# Patient Record
Sex: Male | Born: 1950 | Race: Black or African American | Hispanic: No | Marital: Single | Smoking: Former smoker
Health system: Southern US, Community
[De-identification: ages and names within clinical notes are randomized; demographics above are authoritative.]

## PROBLEM LIST (undated history)

## (undated) DIAGNOSIS — C801 Malignant (primary) neoplasm, unspecified: Secondary | ICD-10-CM

## (undated) DIAGNOSIS — D649 Anemia, unspecified: Secondary | ICD-10-CM

## (undated) DIAGNOSIS — N183 Chronic kidney disease, stage 3 unspecified: Secondary | ICD-10-CM

## (undated) DIAGNOSIS — D509 Iron deficiency anemia, unspecified: Secondary | ICD-10-CM

## (undated) DIAGNOSIS — E785 Hyperlipidemia, unspecified: Secondary | ICD-10-CM

## (undated) DIAGNOSIS — C8190 Hodgkin lymphoma, unspecified, unspecified site: Secondary | ICD-10-CM

## (undated) DIAGNOSIS — I10 Essential (primary) hypertension: Secondary | ICD-10-CM

## (undated) HISTORY — PX: NO PAST SURGERIES: SHX2092

## (undated) HISTORY — DX: Hyperlipidemia, unspecified: E78.5

## (undated) HISTORY — DX: Essential (primary) hypertension: I10

---

## 2000-02-23 ENCOUNTER — Ambulatory Visit (HOSPITAL_COMMUNITY): Admission: RE | Admit: 2000-02-23 | Discharge: 2000-02-23 | Payer: Self-pay | Admitting: Pulmonary Disease

## 2000-02-23 ENCOUNTER — Encounter: Payer: Self-pay | Admitting: Pulmonary Disease

## 2000-05-30 ENCOUNTER — Ambulatory Visit (HOSPITAL_COMMUNITY): Admission: RE | Admit: 2000-05-30 | Discharge: 2000-05-30 | Payer: Self-pay | Admitting: Pulmonary Disease

## 2000-05-30 ENCOUNTER — Encounter: Payer: Self-pay | Admitting: Pulmonary Disease

## 2000-08-07 ENCOUNTER — Emergency Department (HOSPITAL_COMMUNITY): Admission: EM | Admit: 2000-08-07 | Discharge: 2000-08-07 | Payer: Self-pay | Admitting: Emergency Medicine

## 2002-02-07 ENCOUNTER — Encounter: Payer: Self-pay | Admitting: Emergency Medicine

## 2002-02-07 ENCOUNTER — Emergency Department (HOSPITAL_COMMUNITY): Admission: EM | Admit: 2002-02-07 | Discharge: 2002-02-07 | Payer: Self-pay | Admitting: Emergency Medicine

## 2002-09-24 ENCOUNTER — Emergency Department (HOSPITAL_COMMUNITY): Admission: EM | Admit: 2002-09-24 | Discharge: 2002-09-24 | Payer: Self-pay | Admitting: Emergency Medicine

## 2002-12-04 ENCOUNTER — Emergency Department (HOSPITAL_COMMUNITY): Admission: EM | Admit: 2002-12-04 | Discharge: 2002-12-04 | Payer: Self-pay | Admitting: Emergency Medicine

## 2002-12-04 ENCOUNTER — Encounter: Payer: Self-pay | Admitting: Emergency Medicine

## 2003-08-16 DIAGNOSIS — E785 Hyperlipidemia, unspecified: Secondary | ICD-10-CM

## 2003-08-16 HISTORY — DX: Hyperlipidemia, unspecified: E78.5

## 2007-10-27 ENCOUNTER — Emergency Department (HOSPITAL_COMMUNITY): Admission: EM | Admit: 2007-10-27 | Discharge: 2007-10-27 | Payer: Self-pay | Admitting: Emergency Medicine

## 2007-10-29 ENCOUNTER — Ambulatory Visit (HOSPITAL_COMMUNITY): Admission: RE | Admit: 2007-10-29 | Discharge: 2007-10-29 | Payer: Self-pay | Admitting: Pulmonary Disease

## 2007-11-05 ENCOUNTER — Ambulatory Visit: Payer: Self-pay | Admitting: Oncology

## 2007-11-09 ENCOUNTER — Ambulatory Visit (HOSPITAL_COMMUNITY): Admission: RE | Admit: 2007-11-09 | Discharge: 2007-11-09 | Payer: Self-pay | Admitting: Oncology

## 2007-11-13 ENCOUNTER — Ambulatory Visit (HOSPITAL_COMMUNITY): Admission: RE | Admit: 2007-11-13 | Discharge: 2007-11-13 | Payer: Self-pay | Admitting: Oncology

## 2007-11-13 ENCOUNTER — Encounter (INDEPENDENT_AMBULATORY_CARE_PROVIDER_SITE_OTHER): Payer: Self-pay | Admitting: Interventional Radiology

## 2007-11-21 ENCOUNTER — Ambulatory Visit: Payer: Self-pay | Admitting: Oncology

## 2007-11-21 ENCOUNTER — Encounter (HOSPITAL_COMMUNITY): Payer: Self-pay | Admitting: Oncology

## 2007-11-21 ENCOUNTER — Ambulatory Visit (HOSPITAL_COMMUNITY): Admission: RE | Admit: 2007-11-21 | Discharge: 2007-11-21 | Payer: Self-pay | Admitting: Oncology

## 2007-11-26 ENCOUNTER — Ambulatory Visit: Admission: RE | Admit: 2007-11-26 | Discharge: 2007-11-26 | Payer: Self-pay | Admitting: Oncology

## 2007-11-28 ENCOUNTER — Encounter (HOSPITAL_COMMUNITY): Payer: Self-pay | Admitting: Oncology

## 2007-11-28 ENCOUNTER — Ambulatory Visit: Admission: RE | Admit: 2007-11-28 | Discharge: 2007-11-28 | Payer: Self-pay | Admitting: Oncology

## 2007-11-28 ENCOUNTER — Ambulatory Visit: Payer: Self-pay | Admitting: Internal Medicine

## 2007-11-28 ENCOUNTER — Encounter (HOSPITAL_COMMUNITY): Admission: RE | Admit: 2007-11-28 | Discharge: 2008-02-14 | Payer: Self-pay | Admitting: Oncology

## 2007-11-28 LAB — CBC WITH DIFFERENTIAL/PLATELET
Basophils Absolute: 0.2 10*3/uL — ABNORMAL HIGH (ref 0.0–0.1)
EOS%: 0.1 % (ref 0.0–7.0)
HCT: 22.5 % — ABNORMAL LOW (ref 38.7–49.9)
HGB: 7.3 g/dL — ABNORMAL LOW (ref 13.0–17.1)
MCH: 28.2 pg (ref 28.0–33.4)
MCV: 86.4 fL (ref 81.6–98.0)
MONO%: 9.4 % (ref 0.0–13.0)
NEUT%: 66.6 % (ref 40.0–75.0)
Platelets: 348 10*3/uL (ref 145–400)

## 2007-11-28 LAB — COMPREHENSIVE METABOLIC PANEL
ALT: 13 U/L (ref 0–53)
Alkaline Phosphatase: 378 U/L — ABNORMAL HIGH (ref 39–117)
CO2: 18 mEq/L — ABNORMAL LOW (ref 19–32)
Sodium: 135 mEq/L (ref 135–145)
Total Bilirubin: 1 mg/dL (ref 0.3–1.2)
Total Protein: 6.7 g/dL (ref 6.0–8.3)

## 2007-11-29 LAB — CBC WITH DIFFERENTIAL/PLATELET
BASO%: 0.5 % (ref 0.0–2.0)
EOS%: 0 % (ref 0.0–7.0)
HCT: 29 % — ABNORMAL LOW (ref 38.7–49.9)
HGB: 9.9 g/dL — ABNORMAL LOW (ref 13.0–17.1)
MCH: 28.9 pg (ref 28.0–33.4)
MCHC: 34 g/dL (ref 32.0–35.9)
MONO#: 1.1 10*3/uL — ABNORMAL HIGH (ref 0.1–0.9)
NEUT%: 78 % — ABNORMAL HIGH (ref 40.0–75.0)
RDW: 15.8 % — ABNORMAL HIGH (ref 11.2–14.6)
WBC: 19.4 10*3/uL — ABNORMAL HIGH (ref 4.0–10.0)
lymph#: 3.1 10*3/uL (ref 0.9–3.3)

## 2007-11-29 LAB — TECHNOLOGIST REVIEW

## 2007-12-06 LAB — CBC WITH DIFFERENTIAL/PLATELET
Basophils Absolute: 0 10*3/uL (ref 0.0–0.1)
Eosinophils Absolute: 0.1 10*3/uL (ref 0.0–0.5)
HGB: 10.6 g/dL — ABNORMAL LOW (ref 13.0–17.1)
MONO#: 0.2 10*3/uL (ref 0.1–0.9)
MONO%: 2.2 % (ref 0.0–13.0)
NEUT#: 5.8 10*3/uL (ref 1.5–6.5)
RBC: 3.68 10*6/uL — ABNORMAL LOW (ref 4.20–5.71)
RDW: 17.3 % — ABNORMAL HIGH (ref 11.2–14.6)
WBC: 8.5 10*3/uL (ref 4.0–10.0)
lymph#: 2.4 10*3/uL (ref 0.9–3.3)

## 2007-12-13 LAB — COMPREHENSIVE METABOLIC PANEL
ALT: 11 U/L (ref 0–53)
Alkaline Phosphatase: 222 U/L — ABNORMAL HIGH (ref 39–117)
Creatinine, Ser: 0.81 mg/dL (ref 0.40–1.50)
Sodium: 139 mEq/L (ref 135–145)
Total Bilirubin: 0.7 mg/dL (ref 0.3–1.2)
Total Protein: 7.2 g/dL (ref 6.0–8.3)

## 2007-12-13 LAB — CBC WITH DIFFERENTIAL/PLATELET
Basophils Absolute: 0.1 10*3/uL (ref 0.0–0.1)
Eosinophils Absolute: 0.1 10*3/uL (ref 0.0–0.5)
HCT: 29.8 % — ABNORMAL LOW (ref 38.7–49.9)
HGB: 9.9 g/dL — ABNORMAL LOW (ref 13.0–17.1)
LYMPH%: 30 % (ref 14.0–48.0)
MCV: 86.7 fL (ref 81.6–98.0)
MONO#: 1.5 10*3/uL — ABNORMAL HIGH (ref 0.1–0.9)
NEUT#: 3.9 10*3/uL (ref 1.5–6.5)
Platelets: 386 10*3/uL (ref 145–400)
RBC: 3.44 10*6/uL — ABNORMAL LOW (ref 4.20–5.71)
WBC: 7.8 10*3/uL (ref 4.0–10.0)

## 2007-12-13 LAB — ERYTHROCYTE SEDIMENTATION RATE: Sed Rate: 103 mm/hr — ABNORMAL HIGH (ref 0–20)

## 2007-12-13 LAB — URIC ACID: Uric Acid, Serum: 3.3 mg/dL — ABNORMAL LOW (ref 4.0–7.8)

## 2007-12-17 ENCOUNTER — Ambulatory Visit: Payer: Self-pay | Admitting: Oncology

## 2007-12-20 LAB — CBC WITH DIFFERENTIAL/PLATELET
BASO%: 0.8 % (ref 0.0–2.0)
LYMPH%: 30.5 % (ref 14.0–48.0)
MCHC: 33.8 g/dL (ref 32.0–35.9)
MONO#: 0.1 10*3/uL (ref 0.1–0.9)
MONO%: 0.8 % (ref 0.0–13.0)
Platelets: 456 10*3/uL — ABNORMAL HIGH (ref 145–400)
RBC: 3.44 10*6/uL — ABNORMAL LOW (ref 4.20–5.71)
WBC: 7.7 10*3/uL (ref 4.0–10.0)

## 2008-01-08 ENCOUNTER — Ambulatory Visit (HOSPITAL_COMMUNITY): Admission: RE | Admit: 2008-01-08 | Discharge: 2008-01-08 | Payer: Self-pay | Admitting: Oncology

## 2008-01-17 LAB — CBC WITH DIFFERENTIAL/PLATELET
BASO%: 0.7 % (ref 0.0–2.0)
LYMPH%: 25 % (ref 14.0–48.0)
MCHC: 31.6 g/dL — ABNORMAL LOW (ref 32.0–35.9)
MONO#: 1.8 10*3/uL — ABNORMAL HIGH (ref 0.1–0.9)
MONO%: 8.1 % (ref 0.0–13.0)
Platelets: 709 10*3/uL — ABNORMAL HIGH (ref 145–400)
RBC: 3.34 10*6/uL — ABNORMAL LOW (ref 4.20–5.71)
WBC: 21.7 10*3/uL — ABNORMAL HIGH (ref 4.0–10.0)

## 2008-01-23 ENCOUNTER — Ambulatory Visit (HOSPITAL_COMMUNITY): Admission: RE | Admit: 2008-01-23 | Discharge: 2008-01-23 | Payer: Self-pay | Admitting: Oncology

## 2008-01-23 LAB — CBC WITH DIFFERENTIAL/PLATELET
BASO%: 0.6 % (ref 0.0–2.0)
HCT: 29.8 % — ABNORMAL LOW (ref 38.7–49.9)
MCHC: 32.3 g/dL (ref 32.0–35.9)
MONO#: 0.9 10*3/uL (ref 0.1–0.9)
RBC: 3.51 10*6/uL — ABNORMAL LOW (ref 4.20–5.71)
RDW: 21.2 % — ABNORMAL HIGH (ref 11.2–14.6)
WBC: 14.9 10*3/uL — ABNORMAL HIGH (ref 4.0–10.0)
lymph#: 4.8 10*3/uL — ABNORMAL HIGH (ref 0.9–3.3)

## 2008-01-23 LAB — COMPREHENSIVE METABOLIC PANEL
ALT: 15 U/L (ref 0–53)
AST: 18 U/L (ref 0–37)
Albumin: 3.5 g/dL (ref 3.5–5.2)
Calcium: 9.1 mg/dL (ref 8.4–10.5)
Chloride: 108 mEq/L (ref 96–112)
Potassium: 3.8 mEq/L (ref 3.5–5.3)
Sodium: 141 mEq/L (ref 135–145)

## 2008-01-23 LAB — ERYTHROCYTE SEDIMENTATION RATE: Sed Rate: 105 mm/hr — ABNORMAL HIGH (ref 0–20)

## 2008-01-31 LAB — CBC WITH DIFFERENTIAL/PLATELET
BASO%: 0.2 % (ref 0.0–2.0)
Basophils Absolute: 0 10*3/uL (ref 0.0–0.1)
EOS%: 4.5 % (ref 0.0–7.0)
MCH: 28 pg (ref 28.0–33.4)
MCHC: 32.9 g/dL (ref 32.0–35.9)
MCV: 84.9 fL (ref 81.6–98.0)
MONO%: 2.3 % (ref 0.0–13.0)
NEUT%: 67.7 % (ref 40.0–75.0)
RDW: 21.5 % — ABNORMAL HIGH (ref 11.2–14.6)
lymph#: 3.1 10*3/uL (ref 0.9–3.3)

## 2008-02-05 ENCOUNTER — Ambulatory Visit: Payer: Self-pay | Admitting: Oncology

## 2008-02-07 LAB — COMPREHENSIVE METABOLIC PANEL
ALT: 12 U/L (ref 0–53)
AST: 16 U/L (ref 0–37)
Alkaline Phosphatase: 151 U/L — ABNORMAL HIGH (ref 39–117)
BUN: 14 mg/dL (ref 6–23)
Calcium: 9.1 mg/dL (ref 8.4–10.5)
Chloride: 105 mEq/L (ref 96–112)
Creatinine, Ser: 0.69 mg/dL (ref 0.40–1.50)
Potassium: 4.4 mEq/L (ref 3.5–5.3)

## 2008-02-07 LAB — CBC WITH DIFFERENTIAL/PLATELET
Basophils Absolute: 0.2 10*3/uL — ABNORMAL HIGH (ref 0.0–0.1)
EOS%: 5 % (ref 0.0–7.0)
Eosinophils Absolute: 0.5 10*3/uL (ref 0.0–0.5)
HCT: 34.1 % — ABNORMAL LOW (ref 38.7–49.9)
HGB: 11 g/dL — ABNORMAL LOW (ref 13.0–17.1)
LYMPH%: 25.8 % (ref 14.0–48.0)
MCH: 27.8 pg — ABNORMAL LOW (ref 28.0–33.4)
MCV: 86.5 fL (ref 81.6–98.0)
MONO%: 12.7 % (ref 0.0–13.0)
NEUT#: 5.9 10*3/uL (ref 1.5–6.5)
NEUT%: 54.8 % (ref 40.0–75.0)
Platelets: 445 10*3/uL — ABNORMAL HIGH (ref 145–400)
RDW: 21.3 % — ABNORMAL HIGH (ref 11.2–14.6)

## 2008-02-14 LAB — CBC WITH DIFFERENTIAL/PLATELET
BASO%: 0.5 % (ref 0.0–2.0)
Basophils Absolute: 0.1 10*3/uL (ref 0.0–0.1)
EOS%: 2.3 % (ref 0.0–7.0)
Eosinophils Absolute: 0.3 10*3/uL (ref 0.0–0.5)
HCT: 34.3 % — ABNORMAL LOW (ref 38.7–49.9)
HGB: 11 g/dL — ABNORMAL LOW (ref 13.0–17.1)
LYMPH%: 37.3 % (ref 14.0–48.0)
MCH: 27.6 pg — ABNORMAL LOW (ref 28.0–33.4)
MCHC: 32.1 g/dL (ref 32.0–35.9)
MCV: 86.1 fL (ref 81.6–98.0)
MONO#: 0.1 10*3/uL (ref 0.1–0.9)
MONO%: 1.1 % (ref 0.0–13.0)
NEUT#: 6.9 10*3/uL — ABNORMAL HIGH (ref 1.5–6.5)
NEUT%: 58.8 % (ref 40.0–75.0)
Platelets: 529 10*3/uL — ABNORMAL HIGH (ref 145–400)
RBC: 3.99 10*6/uL — ABNORMAL LOW (ref 4.20–5.71)
RDW: 21.9 % — ABNORMAL HIGH (ref 11.2–14.6)
WBC: 11.7 10*3/uL — ABNORMAL HIGH (ref 4.0–10.0)
lymph#: 4.4 10*3/uL — ABNORMAL HIGH (ref 0.9–3.3)

## 2008-02-20 LAB — CBC WITH DIFFERENTIAL/PLATELET
Eosinophils Absolute: 0.1 10*3/uL (ref 0.0–0.5)
HCT: 35.1 % — ABNORMAL LOW (ref 38.7–49.9)
HGB: 11.3 g/dL — ABNORMAL LOW (ref 13.0–17.1)
LYMPH%: 41 % (ref 14.0–48.0)
MONO#: 0.6 10*3/uL (ref 0.1–0.9)
NEUT#: 2.6 10*3/uL (ref 1.5–6.5)
NEUT%: 44.8 % (ref 40.0–75.0)
Platelets: 329 10*3/uL (ref 145–400)
WBC: 5.8 10*3/uL (ref 4.0–10.0)

## 2008-02-20 LAB — COMPREHENSIVE METABOLIC PANEL
AST: 12 U/L (ref 0–37)
Albumin: 3.9 g/dL (ref 3.5–5.2)
BUN: 16 mg/dL (ref 6–23)
Calcium: 8.9 mg/dL (ref 8.4–10.5)
Chloride: 111 mEq/L (ref 96–112)
Glucose, Bld: 78 mg/dL (ref 70–99)
Potassium: 4.8 mEq/L (ref 3.5–5.3)
Sodium: 142 mEq/L (ref 135–145)
Total Protein: 6.5 g/dL (ref 6.0–8.3)

## 2008-02-20 LAB — ERYTHROCYTE SEDIMENTATION RATE: Sed Rate: 40 mm/hr — ABNORMAL HIGH (ref 0–20)

## 2008-02-28 LAB — CBC WITH DIFFERENTIAL/PLATELET
BASO%: 0.2 % (ref 0.0–2.0)
Basophils Absolute: 0 10*3/uL (ref 0.0–0.1)
Eosinophils Absolute: 0.1 10*3/uL (ref 0.0–0.5)
HCT: 35.9 % — ABNORMAL LOW (ref 38.7–49.9)
HGB: 11.9 g/dL — ABNORMAL LOW (ref 13.0–17.1)
LYMPH%: 39.3 % (ref 14.0–48.0)
MCHC: 33 g/dL (ref 32.0–35.9)
MONO#: 0.2 10*3/uL (ref 0.1–0.9)
NEUT#: 5.5 10*3/uL (ref 1.5–6.5)
NEUT%: 57 % (ref 40.0–75.0)
Platelets: 428 10*3/uL — ABNORMAL HIGH (ref 145–400)
WBC: 9.7 10*3/uL (ref 4.0–10.0)
lymph#: 3.8 10*3/uL — ABNORMAL HIGH (ref 0.9–3.3)

## 2008-03-05 LAB — CBC WITH DIFFERENTIAL/PLATELET
Basophils Absolute: 0 10*3/uL (ref 0.0–0.1)
EOS%: 3.3 % (ref 0.0–7.0)
HCT: 38 % — ABNORMAL LOW (ref 38.7–49.9)
HGB: 12.3 g/dL — ABNORMAL LOW (ref 13.0–17.1)
LYMPH%: 41.9 % (ref 14.0–48.0)
MCH: 28.6 pg (ref 28.0–33.4)
MCHC: 32.3 g/dL (ref 32.0–35.9)
NEUT%: 39.4 % — ABNORMAL LOW (ref 40.0–75.0)
Platelets: 304 10*3/uL (ref 145–400)
lymph#: 2.1 10*3/uL (ref 0.9–3.3)

## 2008-03-05 LAB — COMPREHENSIVE METABOLIC PANEL
Albumin: 4.3 g/dL (ref 3.5–5.2)
Alkaline Phosphatase: 155 U/L — ABNORMAL HIGH (ref 39–117)
BUN: 14 mg/dL (ref 6–23)
Calcium: 9.4 mg/dL (ref 8.4–10.5)
Chloride: 108 mEq/L (ref 96–112)
Creatinine, Ser: 0.76 mg/dL (ref 0.40–1.50)
Glucose, Bld: 132 mg/dL — ABNORMAL HIGH (ref 70–99)
Potassium: 3.9 mEq/L (ref 3.5–5.3)

## 2008-03-05 LAB — URIC ACID: Uric Acid, Serum: 5.8 mg/dL (ref 4.0–7.8)

## 2008-03-20 LAB — CBC WITH DIFFERENTIAL/PLATELET
BASO%: 1.5 % (ref 0.0–2.0)
Eosinophils Absolute: 0.1 10*3/uL (ref 0.0–0.5)
HCT: 37.9 % — ABNORMAL LOW (ref 38.7–49.9)
MCHC: 31.9 g/dL — ABNORMAL LOW (ref 32.0–35.9)
MONO#: 1 10*3/uL — ABNORMAL HIGH (ref 0.1–0.9)
NEUT#: 2.1 10*3/uL (ref 1.5–6.5)
RBC: 4.22 10*6/uL (ref 4.20–5.71)
WBC: 5.7 10*3/uL (ref 4.0–10.0)
lymph#: 2.5 10*3/uL (ref 0.9–3.3)

## 2008-03-20 LAB — COMPREHENSIVE METABOLIC PANEL
ALT: 11 U/L (ref 0–53)
AST: 9 U/L (ref 0–37)
Creatinine, Ser: 0.71 mg/dL (ref 0.40–1.50)
Total Bilirubin: 0.4 mg/dL (ref 0.3–1.2)

## 2008-03-20 LAB — LACTATE DEHYDROGENASE: LDH: 154 U/L (ref 94–250)

## 2008-03-30 ENCOUNTER — Ambulatory Visit: Payer: Self-pay | Admitting: Oncology

## 2008-04-01 ENCOUNTER — Ambulatory Visit (HOSPITAL_COMMUNITY): Admission: RE | Admit: 2008-04-01 | Discharge: 2008-04-01 | Payer: Self-pay | Admitting: Oncology

## 2008-04-03 LAB — CBC WITH DIFFERENTIAL/PLATELET
Basophils Absolute: 0.1 10*3/uL (ref 0.0–0.1)
Eosinophils Absolute: 0.2 10*3/uL (ref 0.0–0.5)
HCT: 36.1 % — ABNORMAL LOW (ref 38.7–49.9)
HGB: 11.5 g/dL — ABNORMAL LOW (ref 13.0–17.1)
MONO#: 1 10*3/uL — ABNORMAL HIGH (ref 0.1–0.9)
NEUT%: 36.7 % — ABNORMAL LOW (ref 40.0–75.0)
Platelets: 273 10*3/uL (ref 145–400)
WBC: 6.8 10*3/uL (ref 4.0–10.0)
lymph#: 3 10*3/uL (ref 0.9–3.3)

## 2008-04-03 LAB — COMPREHENSIVE METABOLIC PANEL
ALT: 9 U/L (ref 0–53)
BUN: 14 mg/dL (ref 6–23)
CO2: 21 mEq/L (ref 19–32)
Calcium: 9.4 mg/dL (ref 8.4–10.5)
Chloride: 108 mEq/L (ref 96–112)
Creatinine, Ser: 0.74 mg/dL (ref 0.40–1.50)
Glucose, Bld: 131 mg/dL — ABNORMAL HIGH (ref 70–99)

## 2008-04-03 LAB — LACTATE DEHYDROGENASE: LDH: 203 U/L (ref 94–250)

## 2008-04-03 LAB — ERYTHROCYTE SEDIMENTATION RATE: Sed Rate: 39 mm/hr — ABNORMAL HIGH (ref 0–20)

## 2008-04-18 LAB — CBC WITH DIFFERENTIAL/PLATELET
BASO%: 1.7 % (ref 0.0–2.0)
EOS%: 2.1 % (ref 0.0–7.0)
MCH: 29.9 pg (ref 28.0–33.4)
MCV: 93.2 fL (ref 81.6–98.0)
MONO%: 16.8 % — ABNORMAL HIGH (ref 0.0–13.0)
RBC: 4.19 10*6/uL — ABNORMAL LOW (ref 4.20–5.71)
RDW: 23.9 % — ABNORMAL HIGH (ref 11.2–14.6)
lymph#: 2.6 10*3/uL (ref 0.9–3.3)

## 2008-04-18 LAB — LACTATE DEHYDROGENASE: LDH: 163 U/L (ref 94–250)

## 2008-04-18 LAB — COMPREHENSIVE METABOLIC PANEL
ALT: 10 U/L (ref 0–53)
AST: 9 U/L (ref 0–37)
Alkaline Phosphatase: 137 U/L — ABNORMAL HIGH (ref 39–117)
Potassium: 4.3 mEq/L (ref 3.5–5.3)
Sodium: 139 mEq/L (ref 135–145)
Total Bilirubin: 0.4 mg/dL (ref 0.3–1.2)
Total Protein: 6.9 g/dL (ref 6.0–8.3)

## 2008-04-23 LAB — CBC WITH DIFFERENTIAL/PLATELET
Eosinophils Absolute: 0.2 10*3/uL (ref 0.0–0.5)
HGB: 12.8 g/dL — ABNORMAL LOW (ref 13.0–17.1)
MONO#: 0.4 10*3/uL (ref 0.1–0.9)
MONO%: 2.9 % (ref 0.0–13.0)
NEUT#: 8.6 10*3/uL — ABNORMAL HIGH (ref 1.5–6.5)
RBC: 4.35 10*6/uL (ref 4.20–5.71)
RDW: 22.4 % — ABNORMAL HIGH (ref 11.2–14.6)
WBC: 14.3 10*3/uL — ABNORMAL HIGH (ref 4.0–10.0)
lymph#: 4.9 10*3/uL — ABNORMAL HIGH (ref 0.9–3.3)

## 2008-04-23 LAB — ERYTHROCYTE SEDIMENTATION RATE: Sed Rate: 6 mm/hr (ref 0–20)

## 2008-04-25 LAB — CBC WITH DIFFERENTIAL/PLATELET
Basophils Absolute: 0.2 10*3/uL — ABNORMAL HIGH (ref 0.0–0.1)
Eosinophils Absolute: 0.4 10*3/uL (ref 0.0–0.5)
HGB: 13.1 g/dL (ref 13.0–17.1)
LYMPH%: 30.4 % (ref 14.0–48.0)
MONO#: 0.9 10*3/uL (ref 0.1–0.9)
NEUT#: 8.7 10*3/uL — ABNORMAL HIGH (ref 1.5–6.5)
Platelets: 327 10*3/uL (ref 145–400)
RBC: 4.41 10*6/uL (ref 4.20–5.71)
WBC: 14.5 10*3/uL — ABNORMAL HIGH (ref 4.0–10.0)

## 2008-05-02 LAB — CBC WITH DIFFERENTIAL/PLATELET
Eosinophils Absolute: 0.1 10*3/uL (ref 0.0–0.5)
HCT: 31.2 % — ABNORMAL LOW (ref 38.7–49.9)
LYMPH%: 29.1 % (ref 14.0–48.0)
MCHC: 33.4 g/dL (ref 32.0–35.9)
MONO#: 0.4 10*3/uL (ref 0.1–0.9)
NEUT#: 5.5 10*3/uL (ref 1.5–6.5)
NEUT%: 64.5 % (ref 40.0–75.0)
Platelets: 311 10*3/uL (ref 145–400)
WBC: 8.6 10*3/uL (ref 4.0–10.0)

## 2008-05-07 LAB — CBC WITH DIFFERENTIAL/PLATELET
BASO%: 1.3 % (ref 0.0–2.0)
EOS%: 1.4 % (ref 0.0–7.0)
HCT: 36.3 % — ABNORMAL LOW (ref 38.7–49.9)
LYMPH%: 30.4 % (ref 14.0–48.0)
MCH: 29.5 pg (ref 28.0–33.4)
MCHC: 32.7 g/dL (ref 32.0–35.9)
MONO%: 9.4 % (ref 0.0–13.0)
NEUT%: 57.5 % (ref 40.0–75.0)
Platelets: 288 10*3/uL (ref 145–400)

## 2008-05-13 ENCOUNTER — Ambulatory Visit: Payer: Self-pay | Admitting: Oncology

## 2008-05-15 LAB — CBC WITH DIFFERENTIAL/PLATELET
BASO%: 1.5 % (ref 0.0–2.0)
EOS%: 2.3 % (ref 0.0–7.0)
LYMPH%: 36.6 % (ref 14.0–48.0)
MCH: 29.5 pg (ref 28.0–33.4)
MCHC: 32.2 g/dL (ref 32.0–35.9)
MCV: 91.8 fL (ref 81.6–98.0)
MONO%: 7.4 % (ref 0.0–13.0)
Platelets: 262 10*3/uL (ref 145–400)
RBC: 4.03 10*6/uL — ABNORMAL LOW (ref 4.20–5.71)

## 2008-05-15 LAB — COMPREHENSIVE METABOLIC PANEL
ALT: 13 U/L (ref 0–53)
Alkaline Phosphatase: 125 U/L — ABNORMAL HIGH (ref 39–117)
CO2: 18 mEq/L — ABNORMAL LOW (ref 19–32)
Creatinine, Ser: 0.72 mg/dL (ref 0.40–1.50)
Total Bilirubin: 0.4 mg/dL (ref 0.3–1.2)

## 2008-05-15 LAB — LACTATE DEHYDROGENASE: LDH: 201 U/L (ref 94–250)

## 2008-05-15 LAB — ERYTHROCYTE SEDIMENTATION RATE: Sed Rate: 30 mm/hr — ABNORMAL HIGH (ref 0–20)

## 2008-05-23 LAB — CBC WITH DIFFERENTIAL/PLATELET
BASO%: 0.8 % (ref 0.0–2.0)
EOS%: 3 % (ref 0.0–7.0)
HCT: 32.2 % — ABNORMAL LOW (ref 38.7–49.9)
LYMPH%: 25.9 % (ref 14.0–48.0)
MCH: 30.4 pg (ref 28.0–33.4)
MCHC: 33.3 g/dL (ref 32.0–35.9)
NEUT%: 66.3 % (ref 40.0–75.0)
Platelets: 326 10*3/uL (ref 145–400)
RBC: 3.52 10*6/uL — ABNORMAL LOW (ref 4.20–5.71)

## 2008-06-12 LAB — ERYTHROCYTE SEDIMENTATION RATE: Sed Rate: 42 mm/hr — ABNORMAL HIGH (ref 0–20)

## 2008-06-12 LAB — COMPREHENSIVE METABOLIC PANEL
ALT: 10 U/L (ref 0–53)
AST: 14 U/L (ref 0–37)
Alkaline Phosphatase: 119 U/L — ABNORMAL HIGH (ref 39–117)
CO2: 21 mEq/L (ref 19–32)
Creatinine, Ser: 0.86 mg/dL (ref 0.40–1.50)
Total Bilirubin: 0.4 mg/dL (ref 0.3–1.2)

## 2008-06-12 LAB — CBC WITH DIFFERENTIAL/PLATELET
BASO%: 0.3 % (ref 0.0–2.0)
EOS%: 2.3 % (ref 0.0–7.0)
MCH: 31.9 pg (ref 28.0–33.4)
MCHC: 33.7 g/dL (ref 32.0–35.9)
RBC: 3.37 10*6/uL — ABNORMAL LOW (ref 4.20–5.71)
RDW: 23 % — ABNORMAL HIGH (ref 11.2–14.6)
WBC: 10.2 10*3/uL — ABNORMAL HIGH (ref 4.0–10.0)
lymph#: 2.5 10*3/uL (ref 0.9–3.3)

## 2008-06-12 LAB — TECHNOLOGIST REVIEW

## 2008-06-12 LAB — LACTATE DEHYDROGENASE: LDH: 233 U/L (ref 94–250)

## 2008-06-20 LAB — CBC WITH DIFFERENTIAL/PLATELET
BASO%: 1.5 % (ref 0.0–2.0)
EOS%: 2.2 % (ref 0.0–7.0)
HCT: 34.8 % — ABNORMAL LOW (ref 38.7–49.9)
HGB: 11.4 g/dL — ABNORMAL LOW (ref 13.0–17.1)
MCH: 31.6 pg (ref 28.0–33.4)
MCHC: 32.9 g/dL (ref 32.0–35.9)
MONO#: 0.4 10*3/uL (ref 0.1–0.9)
NEUT%: 61.7 % (ref 40.0–75.0)
RDW: 20.4 % — ABNORMAL HIGH (ref 11.2–14.6)
WBC: 7.4 10*3/uL (ref 4.0–10.0)
lymph#: 2.1 10*3/uL (ref 0.9–3.3)

## 2008-06-27 LAB — CBC WITH DIFFERENTIAL/PLATELET
Basophils Absolute: 0 10*3/uL (ref 0.0–0.1)
Eosinophils Absolute: 0.2 10*3/uL (ref 0.0–0.5)
HCT: 35.6 % — ABNORMAL LOW (ref 38.7–49.9)
HGB: 11.9 g/dL — ABNORMAL LOW (ref 13.0–17.1)
MCH: 32.5 pg (ref 28.0–33.4)
MONO#: 0.3 10*3/uL (ref 0.1–0.9)
NEUT#: 2 10*3/uL (ref 1.5–6.5)
NEUT%: 43.6 % (ref 40.0–75.0)
RDW: 21.2 % — ABNORMAL HIGH (ref 11.2–14.6)
lymph#: 2 10*3/uL (ref 0.9–3.3)

## 2008-07-02 ENCOUNTER — Ambulatory Visit: Payer: Self-pay | Admitting: Oncology

## 2008-07-04 LAB — CBC WITH DIFFERENTIAL/PLATELET
Basophils Absolute: 0.1 10*3/uL (ref 0.0–0.1)
EOS%: 3.6 % (ref 0.0–7.0)
Eosinophils Absolute: 0.3 10*3/uL (ref 0.0–0.5)
HCT: 36.3 % — ABNORMAL LOW (ref 38.7–49.9)
HGB: 12.1 g/dL — ABNORMAL LOW (ref 13.0–17.1)
MCH: 32.5 pg (ref 28.0–33.4)
MCV: 97.3 fL (ref 81.6–98.0)
MONO%: 0.6 % (ref 0.0–13.0)
NEUT#: 4.6 10*3/uL (ref 1.5–6.5)
NEUT%: 61.5 % (ref 40.0–75.0)
Platelets: 239 10*3/uL (ref 145–400)
RDW: 20.5 % — ABNORMAL HIGH (ref 11.2–14.6)

## 2008-07-08 ENCOUNTER — Ambulatory Visit (HOSPITAL_COMMUNITY): Admission: RE | Admit: 2008-07-08 | Discharge: 2008-07-08 | Payer: Self-pay | Admitting: Oncology

## 2008-07-16 LAB — CBC WITH DIFFERENTIAL/PLATELET
BASO%: 1.3 % (ref 0.0–2.0)
EOS%: 1.6 % (ref 0.0–7.0)
HGB: 11.9 g/dL — ABNORMAL LOW (ref 13.0–17.1)
MCH: 31.9 pg (ref 28.0–33.4)
MCV: 95.4 fL (ref 81.6–98.0)
MONO%: 5.9 % (ref 0.0–13.0)
RBC: 3.73 10*6/uL — ABNORMAL LOW (ref 4.20–5.71)
RDW: 17.5 % — ABNORMAL HIGH (ref 11.2–14.6)
lymph#: 2.5 10*3/uL (ref 0.9–3.3)

## 2008-07-16 LAB — LACTATE DEHYDROGENASE: LDH: 242 U/L (ref 94–250)

## 2008-07-16 LAB — COMPREHENSIVE METABOLIC PANEL
ALT: 9 U/L (ref 0–53)
AST: 10 U/L (ref 0–37)
Albumin: 4.2 g/dL (ref 3.5–5.2)
Alkaline Phosphatase: 134 U/L — ABNORMAL HIGH (ref 39–117)
Potassium: 3.5 mEq/L (ref 3.5–5.3)
Sodium: 139 mEq/L (ref 135–145)
Total Bilirubin: 0.5 mg/dL (ref 0.3–1.2)
Total Protein: 6.5 g/dL (ref 6.0–8.3)

## 2008-07-16 LAB — SEDIMENTATION RATE: Sed Rate: 52 mm/hr — ABNORMAL HIGH (ref 0–16)

## 2008-07-23 LAB — CBC WITH DIFFERENTIAL/PLATELET
BASO%: 1.2 % (ref 0.0–2.0)
LYMPH%: 29.1 % (ref 14.0–48.0)
MCHC: 33.3 g/dL (ref 32.0–35.9)
MONO#: 0.2 10*3/uL (ref 0.1–0.9)
NEUT#: 3.2 10*3/uL (ref 1.5–6.5)
Platelets: 301 10*3/uL (ref 145–400)
RBC: 3.64 10*6/uL — ABNORMAL LOW (ref 4.20–5.71)
RDW: 16.5 % — ABNORMAL HIGH (ref 11.2–14.6)
WBC: 5.3 10*3/uL (ref 4.0–10.0)

## 2008-07-25 ENCOUNTER — Ambulatory Visit: Payer: Self-pay | Admitting: Vascular Surgery

## 2008-07-30 LAB — CBC WITH DIFFERENTIAL/PLATELET
BASO%: 3.5 % — ABNORMAL HIGH (ref 0.0–2.0)
Eosinophils Absolute: 0.1 10*3/uL (ref 0.0–0.5)
HCT: 35.3 % — ABNORMAL LOW (ref 38.7–49.9)
MCHC: 33.4 g/dL (ref 32.0–35.9)
MONO#: 0.4 10*3/uL (ref 0.1–0.9)
NEUT#: 0.9 10*3/uL — ABNORMAL LOW (ref 1.5–6.5)
NEUT%: 27.5 % — ABNORMAL LOW (ref 40.0–75.0)
RBC: 3.73 10*6/uL — ABNORMAL LOW (ref 4.20–5.71)
WBC: 3.2 10*3/uL — ABNORMAL LOW (ref 4.0–10.0)
lymph#: 1.6 10*3/uL (ref 0.9–3.3)

## 2008-08-06 LAB — CBC WITH DIFFERENTIAL/PLATELET
Basophils Absolute: 0.3 10*3/uL — ABNORMAL HIGH (ref 0.0–0.1)
HCT: 33.6 % — ABNORMAL LOW (ref 38.7–49.9)
HGB: 11.3 g/dL — ABNORMAL LOW (ref 13.0–17.1)
MONO#: 0.8 10*3/uL (ref 0.1–0.9)
NEUT%: 64.3 % (ref 40.0–75.0)
WBC: 9.2 10*3/uL (ref 4.0–10.0)
lymph#: 2.1 10*3/uL (ref 0.9–3.3)

## 2008-08-13 ENCOUNTER — Ambulatory Visit: Payer: Self-pay | Admitting: Oncology

## 2008-08-13 LAB — CBC WITH DIFFERENTIAL/PLATELET
Basophils Absolute: 0.1 10*3/uL (ref 0.0–0.1)
EOS%: 2.1 % (ref 0.0–7.0)
HCT: 35.7 % — ABNORMAL LOW (ref 38.7–49.9)
HGB: 11.8 g/dL — ABNORMAL LOW (ref 13.0–17.1)
MCH: 31.9 pg (ref 28.0–33.4)
MCV: 96.4 fL (ref 81.6–98.0)
MONO%: 5.3 % (ref 0.0–13.0)
NEUT%: 74.8 % (ref 40.0–75.0)
Platelets: 298 10*3/uL (ref 145–400)

## 2008-08-13 LAB — COMPREHENSIVE METABOLIC PANEL
CO2: 19 mEq/L (ref 19–32)
Calcium: 9.2 mg/dL (ref 8.4–10.5)
Chloride: 109 mEq/L (ref 96–112)
Creatinine, Ser: 0.86 mg/dL (ref 0.40–1.50)
Glucose, Bld: 142 mg/dL — ABNORMAL HIGH (ref 70–99)
Total Bilirubin: 0.4 mg/dL (ref 0.3–1.2)
Total Protein: 6.7 g/dL (ref 6.0–8.3)

## 2008-08-13 LAB — LACTATE DEHYDROGENASE: LDH: 241 U/L (ref 94–250)

## 2008-08-20 LAB — CBC WITH DIFFERENTIAL/PLATELET
BASO%: 1.1 % (ref 0.0–2.0)
LYMPH%: 22.3 % (ref 14.0–48.0)
MCHC: 33.4 g/dL (ref 32.0–35.9)
MCV: 94.8 fL (ref 81.6–98.0)
MONO%: 4.1 % (ref 0.0–13.0)
Platelets: 278 10*3/uL (ref 145–400)
RBC: 3.43 10*6/uL — ABNORMAL LOW (ref 4.20–5.71)
WBC: 6.7 10*3/uL (ref 4.0–10.0)

## 2008-08-20 LAB — TECHNOLOGIST REVIEW

## 2008-08-27 LAB — CBC WITH DIFFERENTIAL/PLATELET
BASO%: 1.1 % (ref 0.0–2.0)
Basophils Absolute: 0 10*3/uL (ref 0.0–0.1)
EOS%: 9 % — ABNORMAL HIGH (ref 0.0–7.0)
Eosinophils Absolute: 0.3 10*3/uL (ref 0.0–0.5)
HCT: 34.2 % — ABNORMAL LOW (ref 38.7–49.9)
HGB: 11.5 g/dL — ABNORMAL LOW (ref 13.0–17.1)
LYMPH%: 42.1 % (ref 14.0–48.0)
MCH: 31.6 pg (ref 28.0–33.4)
MCHC: 33.5 g/dL (ref 32.0–35.9)
MCV: 94.3 fL (ref 81.6–98.0)
MONO#: 0.5 10*3/uL (ref 0.1–0.9)
MONO%: 13.9 % — ABNORMAL HIGH (ref 0.0–13.0)
NEUT#: 1.3 10*3/uL — ABNORMAL LOW (ref 1.5–6.5)
NEUT%: 33.9 % — ABNORMAL LOW (ref 40.0–75.0)
Platelets: 305 10*3/uL (ref 145–400)
RBC: 3.63 10*6/uL — ABNORMAL LOW (ref 4.20–5.71)
RDW: 18.4 % — ABNORMAL HIGH (ref 11.2–14.6)
WBC: 3.8 10*3/uL — ABNORMAL LOW (ref 4.0–10.0)
lymph#: 1.6 10*3/uL (ref 0.9–3.3)

## 2008-08-28 ENCOUNTER — Ambulatory Visit: Payer: Self-pay | Admitting: *Deleted

## 2008-09-11 LAB — ERYTHROCYTE SEDIMENTATION RATE: Sed Rate: 50 mm/hr — ABNORMAL HIGH (ref 0–20)

## 2008-09-11 LAB — CBC WITH DIFFERENTIAL/PLATELET
BASO%: 0.3 % (ref 0.0–2.0)
Basophils Absolute: 0 10*3/uL (ref 0.0–0.1)
Eosinophils Absolute: 0.3 10*3/uL (ref 0.0–0.5)
HCT: 35 % — ABNORMAL LOW (ref 38.7–49.9)
HGB: 11.7 g/dL — ABNORMAL LOW (ref 13.0–17.1)
MCHC: 33.5 g/dL (ref 32.0–35.9)
MONO#: 0.7 10*3/uL (ref 0.1–0.9)
NEUT#: 8.5 10*3/uL — ABNORMAL HIGH (ref 1.5–6.5)
NEUT%: 71.2 % (ref 40.0–75.0)
WBC: 11.9 10*3/uL — ABNORMAL HIGH (ref 4.0–10.0)
lymph#: 2.5 10*3/uL (ref 0.9–3.3)

## 2008-09-11 LAB — COMPREHENSIVE METABOLIC PANEL
Alkaline Phosphatase: 163 U/L — ABNORMAL HIGH (ref 39–117)
BUN: 11 mg/dL (ref 6–23)
CO2: 23 mEq/L (ref 19–32)
Creatinine, Ser: 0.85 mg/dL (ref 0.40–1.50)
Glucose, Bld: 128 mg/dL — ABNORMAL HIGH (ref 70–99)
Total Bilirubin: 0.5 mg/dL (ref 0.3–1.2)

## 2008-09-11 LAB — TECHNOLOGIST REVIEW

## 2008-09-18 LAB — CBC WITH DIFFERENTIAL/PLATELET
BASO%: 0.4 % (ref 0.0–2.0)
Basophils Absolute: 0 10*3/uL (ref 0.0–0.1)
EOS%: 3.9 % (ref 0.0–7.0)
HGB: 10.4 g/dL — ABNORMAL LOW (ref 13.0–17.1)
MCH: 31.6 pg (ref 28.0–33.4)
MCHC: 32.8 g/dL (ref 32.0–35.9)
MCV: 96.6 fL (ref 81.6–98.0)
MONO%: 5.7 % (ref 0.0–13.0)
NEUT%: 65.8 % (ref 40.0–75.0)
RDW: 19.2 % — ABNORMAL HIGH (ref 11.2–14.6)
lymph#: 1.4 10*3/uL (ref 0.9–3.3)

## 2008-09-18 LAB — COMPREHENSIVE METABOLIC PANEL
Alkaline Phosphatase: 138 U/L — ABNORMAL HIGH (ref 39–117)
BUN: 10 mg/dL (ref 6–23)
Creatinine, Ser: 0.7 mg/dL (ref 0.40–1.50)
Glucose, Bld: 151 mg/dL — ABNORMAL HIGH (ref 70–99)
Sodium: 137 mEq/L (ref 135–145)
Total Bilirubin: 0.4 mg/dL (ref 0.3–1.2)
Total Protein: 6.1 g/dL (ref 6.0–8.3)

## 2008-09-18 LAB — URIC ACID: Uric Acid, Serum: 6.8 mg/dL (ref 4.0–7.8)

## 2008-09-18 LAB — LACTATE DEHYDROGENASE: LDH: 239 U/L (ref 94–250)

## 2008-09-18 LAB — ERYTHROCYTE SEDIMENTATION RATE: Sed Rate: 57 mm/hr — ABNORMAL HIGH (ref 0–20)

## 2008-09-23 ENCOUNTER — Ambulatory Visit: Payer: Self-pay | Admitting: Oncology

## 2008-10-02 LAB — CBC WITH DIFFERENTIAL/PLATELET
Basophils Absolute: 0.1 10*3/uL (ref 0.0–0.1)
HCT: 33.7 % — ABNORMAL LOW (ref 38.7–49.9)
HGB: 10.9 g/dL — ABNORMAL LOW (ref 13.0–17.1)
MONO#: 1.1 10*3/uL — ABNORMAL HIGH (ref 0.1–0.9)
NEUT#: 4.8 10*3/uL (ref 1.5–6.5)
NEUT%: 60 % (ref 40.0–75.0)
RDW: 21.1 % — ABNORMAL HIGH (ref 11.2–14.6)
WBC: 8.1 10*3/uL (ref 4.0–10.0)
lymph#: 1.9 10*3/uL (ref 0.9–3.3)

## 2008-10-08 LAB — COMPREHENSIVE METABOLIC PANEL
ALT: <8 U/L (ref 0–53)
Alkaline Phosphatase: 140 U/L — ABNORMAL HIGH (ref 39–117)
CO2: 22 mEq/L (ref 19–32)
Potassium: 3.9 mEq/L (ref 3.5–5.3)
Sodium: 143 mEq/L (ref 135–145)
Total Bilirubin: 0.5 mg/dL (ref 0.3–1.2)
Total Protein: 6.5 g/dL (ref 6.0–8.3)

## 2008-10-08 LAB — CBC WITH DIFFERENTIAL/PLATELET
BASO%: 0.3 % (ref 0.0–2.0)
Eosinophils Absolute: 0.2 10*3/uL (ref 0.0–0.5)
HCT: 34.9 % — ABNORMAL LOW (ref 38.4–49.9)
HGB: 11.4 g/dL — ABNORMAL LOW (ref 13.0–17.1)
MCHC: 32.7 g/dL (ref 32.0–36.0)
MONO#: 0.8 10*3/uL (ref 0.1–0.9)
NEUT#: 7.6 10*3/uL — ABNORMAL HIGH (ref 1.5–6.5)
NEUT%: 73.3 % (ref 39.0–75.0)
WBC: 10.3 10*3/uL (ref 4.0–10.3)
lymph#: 1.7 10*3/uL (ref 0.9–3.3)

## 2008-10-08 LAB — LACTATE DEHYDROGENASE: LDH: 269 U/L — ABNORMAL HIGH (ref 94–250)

## 2008-10-29 ENCOUNTER — Ambulatory Visit (HOSPITAL_COMMUNITY): Admission: RE | Admit: 2008-10-29 | Discharge: 2008-10-29 | Payer: Self-pay | Admitting: *Deleted

## 2008-10-29 ENCOUNTER — Ambulatory Visit: Payer: Self-pay | Admitting: *Deleted

## 2008-10-30 ENCOUNTER — Ambulatory Visit (HOSPITAL_COMMUNITY): Admission: RE | Admit: 2008-10-30 | Discharge: 2008-10-30 | Payer: Self-pay | Admitting: Oncology

## 2008-11-04 LAB — ERYTHROCYTE SEDIMENTATION RATE: Sed Rate: 33 mm/hr — ABNORMAL HIGH (ref 0–20)

## 2008-11-04 LAB — CBC WITH DIFFERENTIAL/PLATELET
BASO%: 0.2 % (ref 0.0–2.0)
LYMPH%: 18.4 % (ref 14.0–49.0)
MCH: 32.1 pg (ref 27.2–33.4)
MCHC: 32.8 g/dL (ref 32.0–36.0)
MCV: 97.8 fL (ref 79.3–98.0)
MONO%: 15.9 % — ABNORMAL HIGH (ref 0.0–14.0)
NEUT%: 63.5 % (ref 39.0–75.0)
Platelets: 333 10*3/uL (ref 140–400)
RBC: 4 10*6/uL — ABNORMAL LOW (ref 4.20–5.82)

## 2008-11-04 LAB — COMPREHENSIVE METABOLIC PANEL
ALT: <8 U/L (ref 0–53)
Alkaline Phosphatase: 154 U/L — ABNORMAL HIGH (ref 39–117)
Creatinine, Ser: 0.85 mg/dL (ref 0.40–1.50)
Sodium: 140 mEq/L (ref 135–145)
Total Bilirubin: 0.5 mg/dL (ref 0.3–1.2)
Total Protein: 6.4 g/dL (ref 6.0–8.3)

## 2008-11-04 LAB — URIC ACID: Uric Acid, Serum: 7.6 mg/dL (ref 4.0–7.8)

## 2008-11-27 ENCOUNTER — Ambulatory Visit: Payer: Self-pay | Admitting: Oncology

## 2008-12-01 LAB — CBC WITH DIFFERENTIAL/PLATELET
Basophils Absolute: 0 10*3/uL (ref 0.0–0.1)
Eosinophils Absolute: 0.1 10*3/uL (ref 0.0–0.5)
HGB: 13.8 g/dL (ref 13.0–17.1)
MCV: 89.9 fL (ref 79.3–98.0)
MONO#: 0.7 10*3/uL (ref 0.1–0.9)
MONO%: 7.2 % (ref 0.0–14.0)
NEUT#: 7.5 10*3/uL — ABNORMAL HIGH (ref 1.5–6.5)
RDW: 17.2 % — ABNORMAL HIGH (ref 11.0–14.6)
WBC: 9.9 10*3/uL (ref 4.0–10.3)

## 2008-12-01 LAB — COMPREHENSIVE METABOLIC PANEL
ALT: <8 U/L (ref 0–53)
AST: 13 U/L (ref 0–37)
Albumin: 4.1 g/dL (ref 3.5–5.2)
Alkaline Phosphatase: 145 U/L — ABNORMAL HIGH (ref 39–117)
Glucose, Bld: 91 mg/dL (ref 70–99)
Potassium: 3.8 mEq/L (ref 3.5–5.3)
Sodium: 137 mEq/L (ref 135–145)
Total Protein: 6.8 g/dL (ref 6.0–8.3)

## 2008-12-01 LAB — LACTATE DEHYDROGENASE: LDH: 260 U/L — ABNORMAL HIGH (ref 94–250)

## 2008-12-18 ENCOUNTER — Ambulatory Visit (HOSPITAL_COMMUNITY): Admission: RE | Admit: 2008-12-18 | Discharge: 2008-12-18 | Payer: Self-pay | Admitting: Oncology

## 2008-12-18 LAB — CBC WITH DIFFERENTIAL/PLATELET
Basophils Absolute: 0 10*3/uL (ref 0.0–0.1)
EOS%: 0.1 % (ref 0.0–7.0)
HGB: 10.7 g/dL — ABNORMAL LOW (ref 13.0–17.1)
MCH: 29 pg (ref 27.2–33.4)
MONO#: 0.1 10*3/uL (ref 0.1–0.9)
NEUT#: 15.4 10*3/uL — ABNORMAL HIGH (ref 1.5–6.5)
RDW: 18.1 % — ABNORMAL HIGH (ref 11.0–14.6)
WBC: 16.4 10*3/uL — ABNORMAL HIGH (ref 4.0–10.3)
lymph#: 0.9 10*3/uL (ref 0.9–3.3)

## 2008-12-18 LAB — BRAIN NATRIURETIC PEPTIDE: Brain Natriuretic Peptide: 271 pg/mL — ABNORMAL HIGH (ref 0.0–100.0)

## 2008-12-18 LAB — BASIC METABOLIC PANEL
CO2: 22 mEq/L (ref 19–32)
Calcium: 8.6 mg/dL (ref 8.4–10.5)
Sodium: 139 mEq/L (ref 135–145)

## 2008-12-18 LAB — TECHNOLOGIST REVIEW

## 2008-12-18 LAB — D-DIMER, QUANTITATIVE: D-Dimer, Quant: 0.83 ug/mL-FEU — ABNORMAL HIGH (ref 0.00–0.48)

## 2008-12-19 LAB — CBC WITH DIFFERENTIAL/PLATELET
Basophils Absolute: 0.1 10*3/uL (ref 0.0–0.1)
Eosinophils Absolute: 0 10*3/uL (ref 0.0–0.5)
HGB: 11.4 g/dL — ABNORMAL LOW (ref 13.0–17.1)
MONO#: 1.3 10*3/uL — ABNORMAL HIGH (ref 0.1–0.9)
NEUT#: 11.3 10*3/uL — ABNORMAL HIGH (ref 1.5–6.5)
RDW: 19.4 % — ABNORMAL HIGH (ref 11.0–14.6)
lymph#: 1.2 10*3/uL (ref 0.9–3.3)

## 2008-12-19 LAB — CK: Total CK: 14 U/L (ref 7–232)

## 2008-12-19 LAB — BRAIN NATRIURETIC PEPTIDE: Brain Natriuretic Peptide: 49.7 pg/mL (ref 0.0–100.0)

## 2008-12-22 LAB — CBC WITH DIFFERENTIAL/PLATELET
Basophils Absolute: 0 10*3/uL (ref 0.0–0.1)
Eosinophils Absolute: 0.1 10*3/uL (ref 0.0–0.5)
HGB: 11.3 g/dL — ABNORMAL LOW (ref 13.0–17.1)
LYMPH%: 43.3 % (ref 14.0–49.0)
MCV: 89.7 fL (ref 79.3–98.0)
MONO#: 0 10*3/uL — ABNORMAL LOW (ref 0.1–0.9)
MONO%: 0.9 % (ref 0.0–14.0)
NEUT#: 1.4 10*3/uL — ABNORMAL LOW (ref 1.5–6.5)
Platelets: 319 10*3/uL (ref 140–400)

## 2008-12-23 ENCOUNTER — Encounter (HOSPITAL_COMMUNITY): Payer: Self-pay | Admitting: Oncology

## 2008-12-23 ENCOUNTER — Ambulatory Visit (HOSPITAL_COMMUNITY): Admission: RE | Admit: 2008-12-23 | Discharge: 2008-12-23 | Payer: Self-pay | Admitting: Oncology

## 2008-12-25 LAB — CBC WITH DIFFERENTIAL/PLATELET
Eosinophils Absolute: 0 10*3/uL (ref 0.0–0.5)
HCT: 32.2 % — ABNORMAL LOW (ref 38.4–49.9)
LYMPH%: 68.4 % — ABNORMAL HIGH (ref 14.0–49.0)
MCHC: 33.3 g/dL (ref 32.0–36.0)
MCV: 88.3 fL (ref 79.3–98.0)
MONO%: 13.2 % (ref 0.0–14.0)
NEUT#: 0.2 10*3/uL — CL (ref 1.5–6.5)
NEUT%: 13.9 % — ABNORMAL LOW (ref 39.0–75.0)
Platelets: 151 10*3/uL (ref 140–400)
RBC: 3.65 10*6/uL — ABNORMAL LOW (ref 4.20–5.82)

## 2008-12-29 LAB — TECHNOLOGIST REVIEW

## 2008-12-29 LAB — CBC WITH DIFFERENTIAL/PLATELET
BASO%: 0.2 % (ref 0.0–2.0)
EOS%: 0.9 % (ref 0.0–7.0)
HCT: 32.9 % — ABNORMAL LOW (ref 38.4–49.9)
LYMPH%: 16.4 % (ref 14.0–49.0)
MCH: 29.2 pg (ref 27.2–33.4)
MCHC: 32.8 g/dL (ref 32.0–36.0)
MONO#: 0.5 10*3/uL (ref 0.1–0.9)
MONO%: 4.4 % (ref 0.0–14.0)
NEUT%: 78.1 % — ABNORMAL HIGH (ref 39.0–75.0)
Platelets: 140 10*3/uL (ref 140–400)
RBC: 3.7 10*6/uL — ABNORMAL LOW (ref 4.20–5.82)
WBC: 12.1 10*3/uL — ABNORMAL HIGH (ref 4.0–10.3)

## 2009-01-02 LAB — CBC WITH DIFFERENTIAL/PLATELET
BASO%: 0.3 % (ref 0.0–2.0)
LYMPH%: 14.3 % (ref 14.0–49.0)
MCH: 29.8 pg (ref 27.2–33.4)
MCHC: 32.9 g/dL (ref 32.0–36.0)
MCV: 90.5 fL (ref 79.3–98.0)
MONO%: 0.8 % (ref 0.0–14.0)
Platelets: 221 10*3/uL (ref 140–400)
RBC: 3.6 10*6/uL — ABNORMAL LOW (ref 4.20–5.82)

## 2009-01-02 LAB — COMPREHENSIVE METABOLIC PANEL
ALT: 10 U/L (ref 0–53)
AST: 15 U/L (ref 0–37)
BUN: 15 mg/dL (ref 6–23)
CO2: 21 mEq/L (ref 19–32)
Creatinine, Ser: 0.73 mg/dL (ref 0.40–1.50)
Total Bilirubin: 0.4 mg/dL (ref 0.3–1.2)

## 2009-01-02 LAB — LACTATE DEHYDROGENASE: LDH: 244 U/L (ref 94–250)

## 2009-01-02 LAB — URIC ACID: Uric Acid, Serum: 5.3 mg/dL (ref 4.0–7.8)

## 2009-01-06 ENCOUNTER — Ambulatory Visit: Payer: Self-pay | Admitting: Oncology

## 2009-01-19 LAB — CBC WITH DIFFERENTIAL/PLATELET
Basophils Absolute: 0.1 10*3/uL (ref 0.0–0.1)
Eosinophils Absolute: 0.2 10*3/uL (ref 0.0–0.5)
HCT: 24.1 % — ABNORMAL LOW (ref 38.4–49.9)
HGB: 8.2 g/dL — ABNORMAL LOW (ref 13.0–17.1)
LYMPH%: 13 % — ABNORMAL LOW (ref 14.0–49.0)
MCH: 29 pg (ref 27.2–33.4)
MCV: 85.2 fL (ref 79.3–98.0)
MONO%: 12.3 % (ref 0.0–14.0)
NEUT#: 19.1 10*3/uL — ABNORMAL HIGH (ref 1.5–6.5)
NEUT%: 73.7 % (ref 39.0–75.0)
Platelets: 69 10*3/uL — ABNORMAL LOW (ref 140–400)

## 2009-01-20 ENCOUNTER — Encounter (HOSPITAL_COMMUNITY): Admission: RE | Admit: 2009-01-20 | Discharge: 2009-02-11 | Payer: Self-pay | Admitting: Oncology

## 2009-01-21 LAB — TYPE & CROSSMATCH - CHCC

## 2009-01-22 ENCOUNTER — Encounter (HOSPITAL_COMMUNITY): Admission: RE | Admit: 2009-01-22 | Discharge: 2009-03-18 | Payer: Self-pay | Admitting: Cardiology

## 2009-01-23 LAB — CBC WITH DIFFERENTIAL/PLATELET
Basophils Absolute: 0.1 10*3/uL (ref 0.0–0.1)
EOS%: 0.2 % (ref 0.0–7.0)
HCT: 31.3 % — ABNORMAL LOW (ref 38.4–49.9)
HGB: 10.9 g/dL — ABNORMAL LOW (ref 13.0–17.1)
LYMPH%: 6.3 % — ABNORMAL LOW (ref 14.0–49.0)
MCH: 31.1 pg (ref 27.2–33.4)
MCV: 88.9 fL (ref 79.3–98.0)
NEUT%: 92.1 % — ABNORMAL HIGH (ref 39.0–75.0)
Platelets: 118 10*3/uL — ABNORMAL LOW (ref 140–400)
lymph#: 2.4 10*3/uL (ref 0.9–3.3)

## 2009-01-23 LAB — COMPREHENSIVE METABOLIC PANEL
ALT: 11 U/L (ref 0–53)
AST: 14 U/L (ref 0–37)
Alkaline Phosphatase: 188 U/L — ABNORMAL HIGH (ref 39–117)
Creatinine, Ser: 0.75 mg/dL (ref 0.40–1.50)
Sodium: 146 mEq/L — ABNORMAL HIGH (ref 135–145)
Total Bilirubin: 0.5 mg/dL (ref 0.3–1.2)
Total Protein: 6.4 g/dL (ref 6.0–8.3)

## 2009-01-23 LAB — LACTATE DEHYDROGENASE: LDH: 361 U/L — ABNORMAL HIGH (ref 94–250)

## 2009-01-26 LAB — CBC WITH DIFFERENTIAL/PLATELET
Basophils Absolute: 0.1 10*3/uL (ref 0.0–0.1)
EOS%: 0.6 % (ref 0.0–7.0)
Eosinophils Absolute: 0.1 10*3/uL (ref 0.0–0.5)
HCT: 31.4 % — ABNORMAL LOW (ref 38.4–49.9)
HGB: 10.6 g/dL — ABNORMAL LOW (ref 13.0–17.1)
MCH: 29.4 pg (ref 27.2–33.4)
MCV: 87 fL (ref 79.3–98.0)
MONO%: 6.8 % (ref 0.0–14.0)
NEUT#: 19.2 10*3/uL — ABNORMAL HIGH (ref 1.5–6.5)
NEUT%: 81.5 % — ABNORMAL HIGH (ref 39.0–75.0)
RDW: 19.9 % — ABNORMAL HIGH (ref 11.0–14.6)
lymph#: 2.6 10*3/uL (ref 0.9–3.3)

## 2009-02-02 LAB — CBC WITH DIFFERENTIAL/PLATELET
Basophils Absolute: 0.1 10*3/uL (ref 0.0–0.1)
EOS%: 1.4 % (ref 0.0–7.0)
Eosinophils Absolute: 0.2 10*3/uL (ref 0.0–0.5)
HGB: 11.7 g/dL — ABNORMAL LOW (ref 13.0–17.1)
LYMPH%: 12.4 % — ABNORMAL LOW (ref 14.0–49.0)
MCH: 29.5 pg (ref 27.2–33.4)
MCV: 89.2 fL (ref 79.3–98.0)
MONO%: 11.4 % (ref 0.0–14.0)
NEUT#: 12.4 10*3/uL — ABNORMAL HIGH (ref 1.5–6.5)
Platelets: 384 10*3/uL (ref 140–400)

## 2009-02-09 LAB — BASIC METABOLIC PANEL
CO2: 19 mEq/L (ref 19–32)
Calcium: 9.2 mg/dL (ref 8.4–10.5)
Creatinine, Ser: 0.84 mg/dL (ref 0.40–1.50)
Sodium: 132 mEq/L — ABNORMAL LOW (ref 135–145)

## 2009-02-09 LAB — CBC WITH DIFFERENTIAL/PLATELET
BASO%: 0.9 % (ref 0.0–2.0)
EOS%: 3.9 % (ref 0.0–7.0)
HCT: 28.9 % — ABNORMAL LOW (ref 38.4–49.9)
LYMPH%: 10.2 % — ABNORMAL LOW (ref 14.0–49.0)
MCH: 30.2 pg (ref 27.2–33.4)
MCHC: 33.9 g/dL (ref 32.0–36.0)
MCV: 89.2 fL (ref 79.3–98.0)
MONO%: 1.6 % (ref 0.0–14.0)
NEUT%: 83.4 % — ABNORMAL HIGH (ref 39.0–75.0)
Platelets: 175 10*3/uL (ref 140–400)
RBC: 3.24 10*6/uL — ABNORMAL LOW (ref 4.20–5.82)

## 2009-02-09 LAB — MAGNESIUM: Magnesium: 2.1 mg/dL (ref 1.5–2.5)

## 2009-02-11 ENCOUNTER — Ambulatory Visit: Payer: Self-pay | Admitting: Oncology

## 2009-02-12 ENCOUNTER — Inpatient Hospital Stay (HOSPITAL_COMMUNITY): Admission: AD | Admit: 2009-02-12 | Discharge: 2009-02-15 | Payer: Self-pay | Admitting: Oncology

## 2009-02-12 ENCOUNTER — Encounter (HOSPITAL_COMMUNITY): Payer: Self-pay | Admitting: Oncology

## 2009-02-12 ENCOUNTER — Ambulatory Visit: Payer: Self-pay | Admitting: Oncology

## 2009-02-12 LAB — CBC WITH DIFFERENTIAL/PLATELET
BASO%: 0.9 % (ref 0.0–2.0)
EOS%: 0.3 % (ref 0.0–7.0)
MCH: 32.2 pg (ref 27.2–33.4)
MCHC: 35.5 g/dL (ref 32.0–36.0)
MCV: 90.8 fL (ref 79.3–98.0)
MONO%: 18.1 % — ABNORMAL HIGH (ref 0.0–14.0)
RBC: 3.15 10*6/uL — ABNORMAL LOW (ref 4.20–5.82)
RDW: 22.2 % — ABNORMAL HIGH (ref 11.0–14.6)
lymph#: 0.6 10*3/uL — ABNORMAL LOW (ref 0.9–3.3)

## 2009-02-12 LAB — BASIC METABOLIC PANEL
BUN: 10 mg/dL (ref 6–23)
Chloride: 100 mEq/L (ref 96–112)
Potassium: 3.3 mEq/L — ABNORMAL LOW (ref 3.5–5.3)
Sodium: 131 mEq/L — ABNORMAL LOW (ref 135–145)

## 2009-02-14 ENCOUNTER — Ambulatory Visit: Payer: Self-pay | Admitting: Hematology & Oncology

## 2009-02-17 LAB — CBC WITH DIFFERENTIAL/PLATELET
Basophils Absolute: 0.1 10*3/uL (ref 0.0–0.1)
Eosinophils Absolute: 0.1 10*3/uL (ref 0.0–0.5)
HGB: 9.1 g/dL — ABNORMAL LOW (ref 13.0–17.1)
LYMPH%: 17.9 % (ref 14.0–49.0)
MONO#: 0.7 10*3/uL (ref 0.1–0.9)
NEUT#: 7.8 10*3/uL — ABNORMAL HIGH (ref 1.5–6.5)
Platelets: 97 10*3/uL — ABNORMAL LOW (ref 140–400)
RBC: 2.86 10*6/uL — ABNORMAL LOW (ref 4.20–5.82)
WBC: 10.7 10*3/uL — ABNORMAL HIGH (ref 4.0–10.3)

## 2009-02-18 ENCOUNTER — Ambulatory Visit (HOSPITAL_COMMUNITY): Admission: RE | Admit: 2009-02-18 | Discharge: 2009-02-18 | Payer: Self-pay | Admitting: Oncology

## 2009-02-23 LAB — CBC WITH DIFFERENTIAL/PLATELET
Eosinophils Absolute: 0.1 10*3/uL (ref 0.0–0.5)
HCT: 26.4 % — ABNORMAL LOW (ref 38.4–49.9)
LYMPH%: 14.9 % (ref 14.0–49.0)
MCHC: 33 g/dL (ref 32.0–36.0)
MCV: 93 fL (ref 79.3–98.0)
MONO#: 1 10*3/uL — ABNORMAL HIGH (ref 0.1–0.9)
NEUT#: 12.1 10*3/uL — ABNORMAL HIGH (ref 1.5–6.5)
NEUT%: 77.7 % — ABNORMAL HIGH (ref 39.0–75.0)
Platelets: 227 10*3/uL (ref 140–400)
WBC: 15.6 10*3/uL — ABNORMAL HIGH (ref 4.0–10.3)

## 2009-02-23 LAB — ERYTHROCYTE SEDIMENTATION RATE: Sed Rate: 54 mm/hr — ABNORMAL HIGH (ref 0–20)

## 2009-02-24 LAB — COMPREHENSIVE METABOLIC PANEL
BUN: 16 mg/dL (ref 6–23)
CO2: 19 mEq/L (ref 19–32)
Calcium: 9.4 mg/dL (ref 8.4–10.5)
Chloride: 108 mEq/L (ref 96–112)
Creatinine, Ser: 0.78 mg/dL (ref 0.40–1.50)

## 2009-02-25 ENCOUNTER — Encounter (HOSPITAL_COMMUNITY): Admission: RE | Admit: 2009-02-25 | Discharge: 2009-05-14 | Payer: Self-pay | Admitting: Oncology

## 2009-02-25 LAB — CBC WITH DIFFERENTIAL/PLATELET
Basophils Absolute: 0 10*3/uL (ref 0.0–0.1)
HCT: 25.8 % — ABNORMAL LOW (ref 38.4–49.9)
HGB: 8.3 g/dL — ABNORMAL LOW (ref 13.0–17.1)
LYMPH%: 5.2 % — ABNORMAL LOW (ref 14.0–49.0)
MCH: 30.3 pg (ref 27.2–33.4)
MONO#: 1.6 10*3/uL — ABNORMAL HIGH (ref 0.1–0.9)
NEUT%: 89.7 % — ABNORMAL HIGH (ref 39.0–75.0)
Platelets: 257 10*3/uL (ref 140–400)
lymph#: 1.6 10*3/uL (ref 0.9–3.3)

## 2009-03-02 LAB — CBC WITH DIFFERENTIAL/PLATELET
Basophils Absolute: 0 10*3/uL (ref 0.0–0.1)
Eosinophils Absolute: 0.1 10*3/uL (ref 0.0–0.5)
HGB: 11.7 g/dL — ABNORMAL LOW (ref 13.0–17.1)
LYMPH%: 26.3 % (ref 14.0–49.0)
MCH: 31.4 pg (ref 27.2–33.4)
MCV: 92.6 fL (ref 79.3–98.0)
MONO%: 1.6 % (ref 0.0–14.0)
NEUT#: 2.3 10*3/uL (ref 1.5–6.5)
NEUT%: 70 % (ref 39.0–75.0)
Platelets: 138 10*3/uL — ABNORMAL LOW (ref 140–400)

## 2009-03-09 LAB — CBC WITH DIFFERENTIAL/PLATELET
Basophils Absolute: 0 10*3/uL (ref 0.0–0.1)
Eosinophils Absolute: 0.1 10*3/uL (ref 0.0–0.5)
HCT: 31.2 % — ABNORMAL LOW (ref 38.4–49.9)
HGB: 10.7 g/dL — ABNORMAL LOW (ref 13.0–17.1)
NEUT#: 8.6 10*3/uL — ABNORMAL HIGH (ref 1.5–6.5)
NEUT%: 69.8 % (ref 39.0–75.0)
RDW: 21.2 % — ABNORMAL HIGH (ref 11.0–14.6)
lymph#: 2.3 10*3/uL (ref 0.9–3.3)

## 2009-03-16 ENCOUNTER — Ambulatory Visit: Payer: Self-pay | Admitting: Oncology

## 2009-03-16 LAB — CBC WITH DIFFERENTIAL/PLATELET
BASO%: 0.4 % (ref 0.0–2.0)
Basophils Absolute: 0.1 10*3/uL (ref 0.0–0.1)
EOS%: 0.8 % (ref 0.0–7.0)
Eosinophils Absolute: 0.1 10*3/uL (ref 0.0–0.5)
HCT: 34.2 % — ABNORMAL LOW (ref 38.4–49.9)
HGB: 11.5 g/dL — ABNORMAL LOW (ref 13.0–17.1)
LYMPH%: 16.2 % (ref 14.0–49.0)
MCH: 30.9 pg (ref 27.2–33.4)
MCHC: 33.6 g/dL (ref 32.0–36.0)
MCV: 91.9 fL (ref 79.3–98.0)
MONO#: 1.6 10*3/uL — ABNORMAL HIGH (ref 0.1–0.9)
MONO%: 9.4 % (ref 0.0–14.0)
NEUT#: 12.5 10*3/uL — ABNORMAL HIGH (ref 1.5–6.5)
NEUT%: 73.2 % (ref 39.0–75.0)
Platelets: 207 10*3/uL (ref 140–400)
RBC: 3.72 10*6/uL — ABNORMAL LOW (ref 4.20–5.82)
RDW: 22.1 % — ABNORMAL HIGH (ref 11.0–14.6)
WBC: 17.1 10*3/uL — ABNORMAL HIGH (ref 4.0–10.3)
lymph#: 2.8 10*3/uL (ref 0.9–3.3)

## 2009-03-16 LAB — ERYTHROCYTE SEDIMENTATION RATE: Sed Rate: 42 mm/hr — ABNORMAL HIGH (ref 0–20)

## 2009-03-16 LAB — COMPREHENSIVE METABOLIC PANEL
ALT: 9 U/L (ref 0–53)
Alkaline Phosphatase: 170 U/L — ABNORMAL HIGH (ref 39–117)
CO2: 18 mEq/L — ABNORMAL LOW (ref 19–32)
Creatinine, Ser: 0.76 mg/dL (ref 0.40–1.50)
Sodium: 140 mEq/L (ref 135–145)
Total Bilirubin: 0.4 mg/dL (ref 0.3–1.2)
Total Protein: 6.7 g/dL (ref 6.0–8.3)

## 2009-03-16 LAB — LACTATE DEHYDROGENASE: LDH: 347 U/L — ABNORMAL HIGH (ref 94–250)

## 2009-03-23 LAB — CBC WITH DIFFERENTIAL/PLATELET
Basophils Absolute: 0 10*3/uL (ref 0.0–0.1)
EOS%: 1.2 % (ref 0.0–7.0)
HCT: 29.5 % — ABNORMAL LOW (ref 38.4–49.9)
HGB: 10.1 g/dL — ABNORMAL LOW (ref 13.0–17.1)
LYMPH%: 24.6 % (ref 14.0–49.0)
MCH: 32.3 pg (ref 27.2–33.4)
MCHC: 34.1 g/dL (ref 32.0–36.0)
MCV: 94.7 fL (ref 79.3–98.0)
MONO%: 1.5 % (ref 0.0–14.0)
NEUT%: 72.4 % (ref 39.0–75.0)

## 2009-03-30 LAB — CBC WITH DIFFERENTIAL/PLATELET
BASO%: 0.4 % (ref 0.0–2.0)
Basophils Absolute: 0.1 10*3/uL (ref 0.0–0.1)
EOS%: 1.1 % (ref 0.0–7.0)
HCT: 27.9 % — ABNORMAL LOW (ref 38.4–49.9)
HGB: 9.5 g/dL — ABNORMAL LOW (ref 13.0–17.1)
MCH: 33.1 pg (ref 27.2–33.4)
MCHC: 34 g/dL (ref 32.0–36.0)
MCV: 97.3 fL (ref 79.3–98.0)
MONO%: 1.6 % (ref 0.0–14.0)
NEUT%: 82.5 % — ABNORMAL HIGH (ref 39.0–75.0)
RDW: 25.5 % — ABNORMAL HIGH (ref 11.0–14.6)
lymph#: 2.1 10*3/uL (ref 0.9–3.3)

## 2009-04-06 LAB — COMPREHENSIVE METABOLIC PANEL
ALT: 8 U/L (ref 0–53)
AST: 14 U/L (ref 0–37)
Albumin: 4.1 g/dL (ref 3.5–5.2)
Alkaline Phosphatase: 180 U/L — ABNORMAL HIGH (ref 39–117)
Glucose, Bld: 100 mg/dL — ABNORMAL HIGH (ref 70–99)
Potassium: 3.5 mEq/L (ref 3.5–5.3)
Sodium: 139 mEq/L (ref 135–145)
Total Bilirubin: 0.4 mg/dL (ref 0.3–1.2)
Total Protein: 6.3 g/dL (ref 6.0–8.3)

## 2009-04-06 LAB — CBC WITH DIFFERENTIAL/PLATELET
BASO%: 0.3 % (ref 0.0–2.0)
Basophils Absolute: 0.1 10*3/uL (ref 0.0–0.1)
EOS%: 0.8 % (ref 0.0–7.0)
HGB: 9.3 g/dL — ABNORMAL LOW (ref 13.0–17.1)
MCH: 31.4 pg (ref 27.2–33.4)
MCV: 95.3 fL (ref 79.3–98.0)
MONO%: 8.4 % (ref 0.0–14.0)
RBC: 2.96 10*6/uL — ABNORMAL LOW (ref 4.20–5.82)
RDW: 23.7 % — ABNORMAL HIGH (ref 11.0–14.6)
lymph#: 2.6 10*3/uL (ref 0.9–3.3)

## 2009-04-06 LAB — ERYTHROCYTE SEDIMENTATION RATE: Sed Rate: 49 mm/hr — ABNORMAL HIGH (ref 0–20)

## 2009-04-13 LAB — CBC WITH DIFFERENTIAL/PLATELET
BASO%: 1.5 % (ref 0.0–2.0)
HCT: 27.5 % — ABNORMAL LOW (ref 38.4–49.9)
LYMPH%: 29.3 % (ref 14.0–49.0)
MCHC: 33.5 g/dL (ref 32.0–36.0)
MCV: 96.2 fL (ref 79.3–98.0)
MONO#: 0.1 10*3/uL (ref 0.1–0.9)
MONO%: 2.4 % (ref 0.0–14.0)
NEUT%: 65.1 % (ref 39.0–75.0)
Platelets: 104 10*3/uL — ABNORMAL LOW (ref 140–400)
RBC: 2.86 10*6/uL — ABNORMAL LOW (ref 4.20–5.82)
WBC: 4.6 10*3/uL (ref 4.0–10.3)

## 2009-04-16 ENCOUNTER — Ambulatory Visit: Payer: Self-pay | Admitting: Oncology

## 2009-04-21 LAB — CBC WITH DIFFERENTIAL/PLATELET
Basophils Absolute: 0 10*3/uL (ref 0.0–0.1)
EOS%: 0.8 % (ref 0.0–7.0)
HGB: 8.5 g/dL — ABNORMAL LOW (ref 13.0–17.1)
LYMPH%: 13.5 % — ABNORMAL LOW (ref 14.0–49.0)
MCH: 34.8 pg — ABNORMAL HIGH (ref 27.2–33.4)
MCV: 101.8 fL — ABNORMAL HIGH (ref 79.3–98.0)
MONO%: 2.8 % (ref 0.0–14.0)
Platelets: 44 10*3/uL — ABNORMAL LOW (ref 140–400)
RDW: 28.2 % — ABNORMAL HIGH (ref 11.0–14.6)

## 2009-04-27 LAB — COMPREHENSIVE METABOLIC PANEL
ALT: <8 U/L (ref 0–53)
Alkaline Phosphatase: 187 U/L — ABNORMAL HIGH (ref 39–117)
Glucose, Bld: 94 mg/dL (ref 70–99)
Sodium: 143 mEq/L (ref 135–145)
Total Bilirubin: 0.5 mg/dL (ref 0.3–1.2)
Total Protein: 6.8 g/dL (ref 6.0–8.3)

## 2009-04-27 LAB — CBC WITH DIFFERENTIAL/PLATELET
BASO%: 0.3 % (ref 0.0–2.0)
EOS%: 0.7 % (ref 0.0–7.0)
LYMPH%: 11.5 % — ABNORMAL LOW (ref 14.0–49.0)
MCH: 32.8 pg (ref 27.2–33.4)
MCHC: 33.1 g/dL (ref 32.0–36.0)
MCV: 99 fL — ABNORMAL HIGH (ref 79.3–98.0)
MONO%: 7.1 % (ref 0.0–14.0)
Platelets: 132 10*3/uL — ABNORMAL LOW (ref 140–400)
RBC: 2.99 10*6/uL — ABNORMAL LOW (ref 4.20–5.82)

## 2009-04-27 LAB — URIC ACID: Uric Acid, Serum: 5.7 mg/dL (ref 4.0–7.8)

## 2009-05-21 ENCOUNTER — Emergency Department (HOSPITAL_COMMUNITY): Admission: EM | Admit: 2009-05-21 | Discharge: 2009-05-21 | Payer: Self-pay | Admitting: Emergency Medicine

## 2009-05-22 ENCOUNTER — Ambulatory Visit: Payer: Self-pay | Admitting: Oncology

## 2009-05-26 LAB — CBC WITH DIFFERENTIAL/PLATELET
Eosinophils Absolute: 0.2 10*3/uL (ref 0.0–0.5)
MONO#: 0.5 10*3/uL (ref 0.1–0.9)
NEUT#: 4.7 10*3/uL (ref 1.5–6.5)
RBC: 3.53 10*6/uL — ABNORMAL LOW (ref 4.20–5.82)
RDW: 19.9 % — ABNORMAL HIGH (ref 11.0–14.6)
WBC: 7.5 10*3/uL (ref 4.0–10.3)
nRBC: 0 % (ref 0–0)

## 2009-05-26 LAB — COMPREHENSIVE METABOLIC PANEL
ALT: <8 U/L (ref 0–53)
CO2: 21 mEq/L (ref 19–32)
Calcium: 9.2 mg/dL (ref 8.4–10.5)
Chloride: 105 mEq/L (ref 96–112)
Sodium: 140 mEq/L (ref 135–145)
Total Bilirubin: 0.6 mg/dL (ref 0.3–1.2)
Total Protein: 6.4 g/dL (ref 6.0–8.3)

## 2009-05-26 LAB — LACTATE DEHYDROGENASE: LDH: 164 U/L (ref 94–250)

## 2009-06-24 ENCOUNTER — Ambulatory Visit: Payer: Self-pay | Admitting: Oncology

## 2009-08-17 ENCOUNTER — Ambulatory Visit: Payer: Self-pay | Admitting: Oncology

## 2009-08-20 LAB — CBC WITH DIFFERENTIAL/PLATELET
BASO%: 0.4 % (ref 0.0–2.0)
Basophils Absolute: 0 10*3/uL (ref 0.0–0.1)
EOS%: 0.9 % (ref 0.0–7.0)
HCT: 40.6 % (ref 38.4–49.9)
HGB: 13.5 g/dL (ref 13.0–17.1)
MCH: 32.2 pg (ref 27.2–33.4)
MCHC: 33.3 g/dL (ref 32.0–36.0)
MONO%: 7.9 % (ref 0.0–14.0)
RBC: 4.19 10*6/uL — ABNORMAL LOW (ref 4.20–5.82)
nRBC: 0 % (ref 0–0)

## 2009-08-20 LAB — COMPREHENSIVE METABOLIC PANEL
Albumin: 4.5 g/dL (ref 3.5–5.2)
Alkaline Phosphatase: 195 U/L — ABNORMAL HIGH (ref 39–117)
BUN: 16 mg/dL (ref 6–23)
CO2: 20 mEq/L (ref 19–32)
Creatinine, Ser: 0.94 mg/dL (ref 0.40–1.50)
Potassium: 4.2 mEq/L (ref 3.5–5.3)
Sodium: 140 mEq/L (ref 135–145)

## 2009-08-20 LAB — C-REACTIVE PROTEIN: CRP: 7.9 mg/dL — ABNORMAL HIGH (ref <0.6)

## 2009-08-20 LAB — SEDIMENTATION RATE: Sed Rate: 69 mm/hr — ABNORMAL HIGH (ref 0–16)

## 2009-09-22 ENCOUNTER — Ambulatory Visit: Payer: Self-pay | Admitting: Oncology

## 2009-09-24 LAB — COMPREHENSIVE METABOLIC PANEL
ALT: <8 U/L (ref 0–53)
AST: 12 U/L (ref 0–37)
Albumin: 4.1 g/dL (ref 3.5–5.2)
Calcium: 9.5 mg/dL (ref 8.4–10.5)
Chloride: 105 mEq/L (ref 96–112)
Creatinine, Ser: 0.83 mg/dL (ref 0.40–1.50)
Potassium: 4.2 mEq/L (ref 3.5–5.3)

## 2009-09-24 LAB — CBC WITH DIFFERENTIAL/PLATELET
BASO%: 0.3 % (ref 0.0–2.0)
MCHC: 32.7 g/dL (ref 32.0–36.0)
MONO#: 0.5 10*3/uL (ref 0.1–0.9)
RBC: 4.06 10*6/uL — ABNORMAL LOW (ref 4.20–5.82)
RDW: 17.9 % — ABNORMAL HIGH (ref 11.0–14.6)
WBC: 16.1 10*3/uL — ABNORMAL HIGH (ref 4.0–10.3)
lymph#: 2.2 10*3/uL (ref 0.9–3.3)

## 2009-09-24 LAB — TECHNOLOGIST REVIEW

## 2009-09-28 ENCOUNTER — Ambulatory Visit (HOSPITAL_COMMUNITY): Admission: RE | Admit: 2009-09-28 | Discharge: 2009-09-28 | Payer: Self-pay | Admitting: Oncology

## 2009-10-13 LAB — CBC WITH DIFFERENTIAL/PLATELET
Basophils Absolute: 0.1 10*3/uL (ref 0.0–0.1)
Eosinophils Absolute: 0.1 10*3/uL (ref 0.0–0.5)
HGB: 11.9 g/dL — ABNORMAL LOW (ref 13.0–17.1)
MCV: 88.8 fL (ref 79.3–98.0)
MONO#: 0.8 10*3/uL (ref 0.1–0.9)
NEUT#: 10.9 10*3/uL — ABNORMAL HIGH (ref 1.5–6.5)
Platelets: 360 10*3/uL (ref 140–400)
RBC: 4.12 10*6/uL — ABNORMAL LOW (ref 4.20–5.82)
RDW: 17.4 % — ABNORMAL HIGH (ref 11.0–14.6)
WBC: 14.6 10*3/uL — ABNORMAL HIGH (ref 4.0–10.3)
nRBC: 0 % (ref 0–0)

## 2009-10-13 LAB — TECHNOLOGIST REVIEW

## 2009-10-15 LAB — COMPREHENSIVE METABOLIC PANEL
ALT: <8 U/L (ref 0–53)
AST: 12 U/L (ref 0–37)
Albumin: 4 g/dL (ref 3.5–5.2)
CO2: 20 mEq/L (ref 19–32)
Calcium: 9.2 mg/dL (ref 8.4–10.5)
Chloride: 103 mEq/L (ref 96–112)
Potassium: 3.6 mEq/L (ref 3.5–5.3)
Total Protein: 6.7 g/dL (ref 6.0–8.3)

## 2009-10-15 LAB — LACTATE DEHYDROGENASE: LDH: 206 U/L (ref 94–250)

## 2009-10-20 ENCOUNTER — Ambulatory Visit: Payer: Self-pay | Admitting: Oncology

## 2009-10-21 ENCOUNTER — Encounter (HOSPITAL_COMMUNITY): Payer: Self-pay | Admitting: Oncology

## 2009-10-21 ENCOUNTER — Ambulatory Visit: Admission: RE | Admit: 2009-10-21 | Discharge: 2009-10-21 | Payer: Self-pay | Admitting: Oncology

## 2009-10-22 LAB — CBC WITH DIFFERENTIAL/PLATELET
BASO%: 0.3 % (ref 0.0–2.0)
EOS%: 1.4 % (ref 0.0–7.0)
HCT: 36.8 % — ABNORMAL LOW (ref 38.4–49.9)
LYMPH%: 33.9 % (ref 14.0–49.0)
MCH: 28.5 pg (ref 27.2–33.4)
MCHC: 32.3 g/dL (ref 32.0–36.0)
MONO%: 5.9 % (ref 0.0–14.0)
NEUT%: 58.5 % (ref 39.0–75.0)
Platelets: 195 10*3/uL (ref 140–400)

## 2009-10-22 LAB — COMPREHENSIVE METABOLIC PANEL
ALT: 8 U/L (ref 0–53)
AST: 11 U/L (ref 0–37)
Creatinine, Ser: 0.83 mg/dL (ref 0.40–1.50)
Total Bilirubin: 0.5 mg/dL (ref 0.3–1.2)

## 2009-10-22 LAB — LACTATE DEHYDROGENASE: LDH: 194 U/L (ref 94–250)

## 2009-10-29 LAB — CBC WITH DIFFERENTIAL/PLATELET
Basophils Absolute: 0.1 10*3/uL (ref 0.0–0.1)
EOS%: 2 % (ref 0.0–7.0)
HCT: 33 % — ABNORMAL LOW (ref 38.4–49.9)
HGB: 10.8 g/dL — ABNORMAL LOW (ref 13.0–17.1)
MCH: 28.2 pg (ref 27.2–33.4)
NEUT%: 44.7 % (ref 39.0–75.0)
lymph#: 2.8 10*3/uL (ref 0.9–3.3)

## 2009-10-30 ENCOUNTER — Ambulatory Visit (HOSPITAL_COMMUNITY): Admission: RE | Admit: 2009-10-30 | Discharge: 2009-10-30 | Payer: Self-pay | Admitting: Oncology

## 2009-11-03 LAB — CBC WITH DIFFERENTIAL/PLATELET
BASO%: 0.8 % (ref 0.0–2.0)
Basophils Absolute: 0.1 10*3/uL (ref 0.0–0.1)
HCT: 31.3 % — ABNORMAL LOW (ref 38.4–49.9)
HGB: 10.3 g/dL — ABNORMAL LOW (ref 13.0–17.1)
MCH: 30 pg (ref 27.2–33.4)
MCV: 91 fL (ref 79.3–98.0)
NEUT%: 74.6 % (ref 39.0–75.0)
Platelets: 227 10*3/uL (ref 140–400)
RDW: 19 % — ABNORMAL HIGH (ref 11.0–14.6)

## 2009-11-03 LAB — COMPREHENSIVE METABOLIC PANEL
ALT: <8 U/L (ref 0–53)
Albumin: 4 g/dL (ref 3.5–5.2)
CO2: 21 mEq/L (ref 19–32)
Calcium: 9.1 mg/dL (ref 8.4–10.5)
Chloride: 104 mEq/L (ref 96–112)
Glucose, Bld: 100 mg/dL — ABNORMAL HIGH (ref 70–99)
Potassium: 3.9 mEq/L (ref 3.5–5.3)
Sodium: 140 mEq/L (ref 135–145)
Total Protein: 6.2 g/dL (ref 6.0–8.3)

## 2009-11-03 LAB — LACTATE DEHYDROGENASE: LDH: 248 U/L (ref 94–250)

## 2009-11-11 LAB — CBC WITH DIFFERENTIAL/PLATELET
BASO%: 0.7 % (ref 0.0–2.0)
Basophils Absolute: 0 10*3/uL (ref 0.0–0.1)
EOS%: 0.7 % (ref 0.0–7.0)
HGB: 11 g/dL — ABNORMAL LOW (ref 13.0–17.1)
MCH: 30.4 pg (ref 27.2–33.4)
MCHC: 33.1 g/dL (ref 32.0–36.0)
MCV: 91.7 fL (ref 79.3–98.0)
MONO%: 2.1 % (ref 0.0–14.0)
RBC: 3.63 10*6/uL — ABNORMAL LOW (ref 4.20–5.82)
RDW: 21.1 % — ABNORMAL HIGH (ref 11.0–14.6)

## 2009-11-19 ENCOUNTER — Ambulatory Visit: Payer: Self-pay | Admitting: Oncology

## 2009-11-19 LAB — CBC WITH DIFFERENTIAL/PLATELET
Basophils Absolute: 0 10*3/uL (ref 0.0–0.1)
Eosinophils Absolute: 0.1 10*3/uL (ref 0.0–0.5)
HCT: 29.5 % — ABNORMAL LOW (ref 38.4–49.9)
HGB: 9.7 g/dL — ABNORMAL LOW (ref 13.0–17.1)
LYMPH%: 50.9 % — ABNORMAL HIGH (ref 14.0–49.0)
MCV: 88.1 fL (ref 79.3–98.0)
MONO%: 5.7 % (ref 0.0–14.0)
NEUT#: 1.9 10*3/uL (ref 1.5–6.5)
NEUT%: 40.7 % (ref 39.0–75.0)
Platelets: 52 10*3/uL — ABNORMAL LOW (ref 140–400)
RDW: 19.4 % — ABNORMAL HIGH (ref 11.0–14.6)

## 2009-11-26 LAB — CBC WITH DIFFERENTIAL/PLATELET
BASO%: 0.5 % (ref 0.0–2.0)
HCT: 29.2 % — ABNORMAL LOW (ref 38.4–49.9)
LYMPH%: 14.7 % (ref 14.0–49.0)
MCHC: 31.8 g/dL — ABNORMAL LOW (ref 32.0–36.0)
MONO#: 1.8 10*3/uL — ABNORMAL HIGH (ref 0.1–0.9)
NEUT%: 75 % (ref 39.0–75.0)
Platelets: 379 10*3/uL (ref 140–400)
WBC: 19.6 10*3/uL — ABNORMAL HIGH (ref 4.0–10.3)

## 2009-11-27 LAB — COMPREHENSIVE METABOLIC PANEL
AST: 9 U/L (ref 0–37)
Albumin: 3.8 g/dL (ref 3.5–5.2)
BUN: 13 mg/dL (ref 6–23)
Calcium: 9 mg/dL (ref 8.4–10.5)
Chloride: 108 mEq/L (ref 96–112)
Creatinine, Ser: 0.82 mg/dL (ref 0.40–1.50)
Glucose, Bld: 98 mg/dL (ref 70–99)

## 2009-11-27 LAB — C-REACTIVE PROTEIN: CRP: 4.5 mg/dL — ABNORMAL HIGH (ref <0.6)

## 2009-11-27 LAB — SEDIMENTATION RATE: Sed Rate: 52 mm/hr — ABNORMAL HIGH (ref 0–16)

## 2009-12-03 LAB — CBC WITH DIFFERENTIAL/PLATELET
BASO%: 1.2 % (ref 0.0–2.0)
Eosinophils Absolute: 0.1 10*3/uL (ref 0.0–0.5)
MCHC: 33.4 g/dL (ref 32.0–36.0)
MONO#: 0.2 10*3/uL (ref 0.1–0.9)
NEUT#: 7.8 10*3/uL — ABNORMAL HIGH (ref 1.5–6.5)
Platelets: 371 10*3/uL (ref 140–400)
RBC: 3.03 10*6/uL — ABNORMAL LOW (ref 4.20–5.82)
RDW: 23.2 % — ABNORMAL HIGH (ref 11.0–14.6)
WBC: 10.8 10*3/uL — ABNORMAL HIGH (ref 4.0–10.3)
lymph#: 2.6 10*3/uL (ref 0.9–3.3)

## 2009-12-04 ENCOUNTER — Ambulatory Visit: Payer: Self-pay | Admitting: Oncology

## 2009-12-08 LAB — CBC WITH DIFFERENTIAL/PLATELET
BASO%: 0.6 % (ref 0.0–2.0)
Basophils Absolute: 0 10*3/uL (ref 0.0–0.1)
EOS%: 0.4 % (ref 0.0–7.0)
HCT: 27.4 % — ABNORMAL LOW (ref 38.4–49.9)
HGB: 9.1 g/dL — ABNORMAL LOW (ref 13.0–17.1)
MCH: 30.1 pg (ref 27.2–33.4)
MONO#: 0.2 10*3/uL (ref 0.1–0.9)
NEUT#: 4.8 10*3/uL (ref 1.5–6.5)
NEUT%: 67.4 % (ref 39.0–75.0)
RDW: 21.7 % — ABNORMAL HIGH (ref 11.0–14.6)
WBC: 7.1 10*3/uL (ref 4.0–10.3)
lymph#: 2.1 10*3/uL (ref 0.9–3.3)

## 2009-12-08 LAB — HOLD TUBE, BLOOD BANK

## 2009-12-23 ENCOUNTER — Ambulatory Visit: Payer: Self-pay | Admitting: Oncology

## 2009-12-24 LAB — CBC WITH DIFFERENTIAL/PLATELET
Basophils Absolute: 0.1 10*3/uL (ref 0.0–0.1)
Eosinophils Absolute: 0.2 10*3/uL (ref 0.0–0.5)
HGB: 9.9 g/dL — ABNORMAL LOW (ref 13.0–17.1)
LYMPH%: 19.1 % (ref 14.0–49.0)
MONO#: 1.7 10*3/uL — ABNORMAL HIGH (ref 0.1–0.9)
NEUT#: 9.6 10*3/uL — ABNORMAL HIGH (ref 1.5–6.5)
Platelets: 499 10*3/uL — ABNORMAL HIGH (ref 140–400)
RBC: 3.16 10*6/uL — ABNORMAL LOW (ref 4.20–5.82)
RDW: 23.8 % — ABNORMAL HIGH (ref 11.0–14.6)
WBC: 14.4 10*3/uL — ABNORMAL HIGH (ref 4.0–10.3)
nRBC: 0 % (ref 0–0)

## 2009-12-31 LAB — CBC WITH DIFFERENTIAL/PLATELET
Basophils Absolute: 0.2 10*3/uL — ABNORMAL HIGH (ref 0.0–0.1)
Eosinophils Absolute: 0.1 10*3/uL (ref 0.0–0.5)
HCT: 31.3 % — ABNORMAL LOW (ref 38.4–49.9)
HGB: 10 g/dL — ABNORMAL LOW (ref 13.0–17.1)
MONO#: 0.4 10*3/uL (ref 0.1–0.9)
NEUT%: 56.3 % (ref 39.0–75.0)
Platelets: 227 10*3/uL (ref 140–400)
WBC: 6.3 10*3/uL (ref 4.0–10.3)
lymph#: 2 10*3/uL (ref 0.9–3.3)

## 2010-01-12 ENCOUNTER — Ambulatory Visit (HOSPITAL_COMMUNITY): Admission: RE | Admit: 2010-01-12 | Discharge: 2010-01-12 | Payer: Self-pay | Admitting: Oncology

## 2010-01-14 LAB — CBC WITH DIFFERENTIAL/PLATELET
Basophils Absolute: 0.1 10*3/uL (ref 0.0–0.1)
EOS%: 1.5 % (ref 0.0–7.0)
Eosinophils Absolute: 0.3 10*3/uL (ref 0.0–0.5)
HCT: 32.7 % — ABNORMAL LOW (ref 38.4–49.9)
HGB: 10.3 g/dL — ABNORMAL LOW (ref 13.0–17.1)
MCH: 33 pg (ref 27.2–33.4)
MCV: 104.8 fL — ABNORMAL HIGH (ref 79.3–98.0)
NEUT#: 14.4 10*3/uL — ABNORMAL HIGH (ref 1.5–6.5)
NEUT%: 75.6 % — ABNORMAL HIGH (ref 39.0–75.0)
lymph#: 2.7 10*3/uL (ref 0.9–3.3)

## 2010-01-15 LAB — COMPREHENSIVE METABOLIC PANEL
ALT: <8 U/L (ref 0–53)
Albumin: 3.9 g/dL (ref 3.5–5.2)
CO2: 21 mEq/L (ref 19–32)
Calcium: 8.9 mg/dL (ref 8.4–10.5)
Chloride: 109 mEq/L (ref 96–112)
Glucose, Bld: 94 mg/dL (ref 70–99)
Sodium: 142 mEq/L (ref 135–145)
Total Bilirubin: 0.4 mg/dL (ref 0.3–1.2)
Total Protein: 5.9 g/dL — ABNORMAL LOW (ref 6.0–8.3)

## 2010-01-15 LAB — LACTATE DEHYDROGENASE: LDH: 279 U/L — ABNORMAL HIGH (ref 94–250)

## 2010-01-15 LAB — SEDIMENTATION RATE: Sed Rate: 28 mm/hr — ABNORMAL HIGH (ref 0–16)

## 2010-01-21 LAB — CBC WITH DIFFERENTIAL/PLATELET
BASO%: 0.6 % (ref 0.0–2.0)
EOS%: 1.8 % (ref 0.0–7.0)
HCT: 32 % — ABNORMAL LOW (ref 38.4–49.9)
MCH: 33.4 pg (ref 27.2–33.4)
MCHC: 32.5 g/dL (ref 32.0–36.0)
MONO#: 0.3 10*3/uL (ref 0.1–0.9)
NEUT%: 62.4 % (ref 39.0–75.0)
RBC: 3.11 10*6/uL — ABNORMAL LOW (ref 4.20–5.82)
RDW: 20 % — ABNORMAL HIGH (ref 11.0–14.6)
WBC: 7.8 10*3/uL (ref 4.0–10.3)
lymph#: 2.4 10*3/uL (ref 0.9–3.3)

## 2010-01-22 ENCOUNTER — Ambulatory Visit: Payer: Self-pay | Admitting: Oncology

## 2010-02-10 LAB — CBC WITH DIFFERENTIAL/PLATELET
Basophils Absolute: 0.1 10*3/uL (ref 0.0–0.1)
EOS%: 1.4 % (ref 0.0–7.0)
HCT: 36.4 % — ABNORMAL LOW (ref 38.4–49.9)
HGB: 11.7 g/dL — ABNORMAL LOW (ref 13.0–17.1)
MCH: 33.7 pg — ABNORMAL HIGH (ref 27.2–33.4)
MONO#: 2.2 10*3/uL — ABNORMAL HIGH (ref 0.1–0.9)
NEUT#: 13.5 10*3/uL — ABNORMAL HIGH (ref 1.5–6.5)
NEUT%: 73.3 % (ref 39.0–75.0)
RDW: 19.5 % — ABNORMAL HIGH (ref 11.0–14.6)
WBC: 18.4 10*3/uL — ABNORMAL HIGH (ref 4.0–10.3)
lymph#: 2.4 10*3/uL (ref 0.9–3.3)

## 2010-02-17 LAB — CBC WITH DIFFERENTIAL/PLATELET
BASO%: 0.5 % (ref 0.0–2.0)
EOS%: 3.3 % (ref 0.0–7.0)
HCT: 35.2 % — ABNORMAL LOW (ref 38.4–49.9)
LYMPH%: 32.7 % (ref 14.0–49.0)
MCH: 35 pg — ABNORMAL HIGH (ref 27.2–33.4)
MCHC: 32.9 g/dL (ref 32.0–36.0)
NEUT%: 60.8 % (ref 39.0–75.0)
Platelets: 173 10*3/uL (ref 140–400)

## 2010-02-18 LAB — COMPREHENSIVE METABOLIC PANEL
AST: 16 U/L (ref 0–37)
Albumin: 4.2 g/dL (ref 3.5–5.2)
Alkaline Phosphatase: 148 U/L — ABNORMAL HIGH (ref 39–117)
Chloride: 102 mEq/L (ref 96–112)
Glucose, Bld: 86 mg/dL (ref 70–99)
Potassium: 3.5 mEq/L (ref 3.5–5.3)
Sodium: 135 mEq/L (ref 135–145)
Total Protein: 6.4 g/dL (ref 6.0–8.3)

## 2010-02-18 LAB — SEDIMENTATION RATE: Sed Rate: 1 mm/hr (ref 0–16)

## 2010-03-02 ENCOUNTER — Ambulatory Visit: Payer: Self-pay | Admitting: Oncology

## 2010-03-04 LAB — CBC WITH DIFFERENTIAL/PLATELET
BASO%: 0.4 % (ref 0.0–2.0)
EOS%: 1.1 % (ref 0.0–7.0)
MCH: 34.1 pg — ABNORMAL HIGH (ref 27.2–33.4)
MCHC: 32.3 g/dL (ref 32.0–36.0)
RDW: 19 % — ABNORMAL HIGH (ref 11.0–14.6)
lymph#: 3.1 10*3/uL (ref 0.9–3.3)

## 2010-03-05 LAB — COMPREHENSIVE METABOLIC PANEL
AST: 13 U/L (ref 0–37)
Albumin: 3.8 g/dL (ref 3.5–5.2)
Alkaline Phosphatase: 168 U/L — ABNORMAL HIGH (ref 39–117)
BUN: 8 mg/dL (ref 6–23)
Potassium: 3.5 mEq/L (ref 3.5–5.3)
Sodium: 139 mEq/L (ref 135–145)
Total Protein: 6.1 g/dL (ref 6.0–8.3)

## 2010-03-11 LAB — CBC WITH DIFFERENTIAL/PLATELET
Eosinophils Absolute: 0.1 10*3/uL (ref 0.0–0.5)
HCT: 30.6 % — ABNORMAL LOW (ref 38.4–49.9)
HGB: 10 g/dL — ABNORMAL LOW (ref 13.0–17.1)
LYMPH%: 23 % (ref 14.0–49.0)
MONO#: 0.5 10*3/uL (ref 0.1–0.9)
NEUT#: 7.3 10*3/uL — ABNORMAL HIGH (ref 1.5–6.5)
NEUT%: 70.1 % (ref 39.0–75.0)
Platelets: 172 10*3/uL (ref 140–400)
WBC: 10.4 10*3/uL — ABNORMAL HIGH (ref 4.0–10.3)
lymph#: 2.4 10*3/uL (ref 0.9–3.3)

## 2010-04-01 ENCOUNTER — Ambulatory Visit: Payer: Self-pay | Admitting: Oncology

## 2010-04-01 LAB — CBC WITH DIFFERENTIAL/PLATELET
Basophils Absolute: 0.1 10*3/uL (ref 0.0–0.1)
Eosinophils Absolute: 0.2 10*3/uL (ref 0.0–0.5)
HGB: 10.5 g/dL — ABNORMAL LOW (ref 13.0–17.1)
LYMPH%: 19.7 % (ref 14.0–49.0)
MCV: 103.5 fL — ABNORMAL HIGH (ref 79.3–98.0)
MONO#: 1.5 10*3/uL — ABNORMAL HIGH (ref 0.1–0.9)
MONO%: 10.9 % (ref 0.0–14.0)
NEUT#: 9.5 10*3/uL — ABNORMAL HIGH (ref 1.5–6.5)
Platelets: 511 10*3/uL — ABNORMAL HIGH (ref 140–400)
RBC: 3.16 10*6/uL — ABNORMAL LOW (ref 4.20–5.82)
RDW: 18.4 % — ABNORMAL HIGH (ref 11.0–14.6)
WBC: 14.1 10*3/uL — ABNORMAL HIGH (ref 4.0–10.3)
nRBC: 0 % (ref 0–0)

## 2010-04-01 LAB — COMPREHENSIVE METABOLIC PANEL
ALT: <8 U/L (ref 0–53)
Albumin: 3.8 g/dL (ref 3.5–5.2)
CO2: 24 mEq/L (ref 19–32)
Calcium: 9.2 mg/dL (ref 8.4–10.5)
Chloride: 108 mEq/L (ref 96–112)
Glucose, Bld: 97 mg/dL (ref 70–99)
Sodium: 142 mEq/L (ref 135–145)
Total Bilirubin: 0.5 mg/dL (ref 0.3–1.2)
Total Protein: 6.1 g/dL (ref 6.0–8.3)

## 2010-04-06 ENCOUNTER — Encounter (HOSPITAL_COMMUNITY): Payer: Self-pay | Admitting: Oncology

## 2010-04-06 ENCOUNTER — Ambulatory Visit: Admission: RE | Admit: 2010-04-06 | Discharge: 2010-04-06 | Payer: Self-pay | Admitting: Oncology

## 2010-04-08 LAB — CBC WITH DIFFERENTIAL/PLATELET
Eosinophils Absolute: 0.1 10*3/uL (ref 0.0–0.5)
MCV: 102.7 fL — ABNORMAL HIGH (ref 79.3–98.0)
MONO#: 0.3 10*3/uL (ref 0.1–0.9)
MONO%: 4.5 % (ref 0.0–14.0)
RBC: 2.94 10*6/uL — ABNORMAL LOW (ref 4.20–5.82)
RDW: 17.5 % — ABNORMAL HIGH (ref 11.0–14.6)
lymph#: 2 10*3/uL (ref 0.9–3.3)

## 2010-04-16 ENCOUNTER — Ambulatory Visit (HOSPITAL_COMMUNITY): Admission: RE | Admit: 2010-04-16 | Discharge: 2010-04-16 | Payer: Self-pay | Admitting: Oncology

## 2010-04-21 LAB — CBC WITH DIFFERENTIAL/PLATELET
Basophils Absolute: 0.1 10*3/uL (ref 0.0–0.1)
Eosinophils Absolute: 0.3 10*3/uL (ref 0.0–0.5)
HCT: 29.4 % — ABNORMAL LOW (ref 38.4–49.9)
LYMPH%: 16.6 % (ref 14.0–49.0)
MCV: 103.5 fL — ABNORMAL HIGH (ref 79.3–98.0)
MONO%: 9.3 % (ref 0.0–14.0)
NEUT#: 12.2 10*3/uL — ABNORMAL HIGH (ref 1.5–6.5)
NEUT%: 71.8 % (ref 39.0–75.0)
Platelets: 205 10*3/uL (ref 140–400)
RBC: 2.84 10*6/uL — ABNORMAL LOW (ref 4.20–5.82)
nRBC: 0 % (ref 0–0)

## 2010-04-21 LAB — COMPREHENSIVE METABOLIC PANEL
ALT: <8 U/L (ref 0–53)
AST: 10 U/L (ref 0–37)
Albumin: 4 g/dL (ref 3.5–5.2)
Alkaline Phosphatase: 206 U/L — ABNORMAL HIGH (ref 39–117)
Calcium: 8.8 mg/dL (ref 8.4–10.5)
Chloride: 105 mEq/L (ref 96–112)
Creatinine, Ser: 0.7 mg/dL (ref 0.40–1.50)
Potassium: 3.5 mEq/L (ref 3.5–5.3)

## 2010-05-05 ENCOUNTER — Ambulatory Visit: Admission: RE | Admit: 2010-05-05 | Discharge: 2010-05-05 | Payer: Self-pay | Admitting: Oncology

## 2010-05-06 ENCOUNTER — Ambulatory Visit: Payer: Self-pay | Admitting: Oncology

## 2010-05-10 LAB — COMPREHENSIVE METABOLIC PANEL
ALT: <8 U/L (ref 0–53)
AST: 13 U/L (ref 0–37)
BUN: 9 mg/dL (ref 6–23)
Calcium: 8.7 mg/dL (ref 8.4–10.5)
Creatinine, Ser: 0.68 mg/dL (ref 0.40–1.50)
Total Bilirubin: 0.4 mg/dL (ref 0.3–1.2)

## 2010-05-10 LAB — CBC WITH DIFFERENTIAL/PLATELET
BASO%: 0.5 % (ref 0.0–2.0)
Basophils Absolute: 0 10*3/uL (ref 0.0–0.1)
EOS%: 2.6 % (ref 0.0–7.0)
HCT: 28.6 % — ABNORMAL LOW (ref 38.4–49.9)
HGB: 9.2 g/dL — ABNORMAL LOW (ref 13.0–17.1)
LYMPH%: 26.7 % (ref 14.0–49.0)
MCH: 33.5 pg — ABNORMAL HIGH (ref 27.2–33.4)
MCHC: 32.2 g/dL (ref 32.0–36.0)
MCV: 103.9 fL — ABNORMAL HIGH (ref 79.3–98.0)
MONO%: 17 % — ABNORMAL HIGH (ref 0.0–14.0)
NEUT%: 53.2 % (ref 39.0–75.0)
Platelets: 582 10*3/uL — ABNORMAL HIGH (ref 140–400)

## 2010-05-25 LAB — CBC WITH DIFFERENTIAL/PLATELET
Basophils Absolute: 0.1 10*3/uL (ref 0.0–0.1)
Eosinophils Absolute: 0.4 10*3/uL (ref 0.0–0.5)
LYMPH%: 19.7 % (ref 14.0–49.0)
MCV: 101.3 fL — ABNORMAL HIGH (ref 79.3–98.0)
MONO#: 1.3 10*3/uL — ABNORMAL HIGH (ref 0.1–0.9)
NEUT#: 6.2 10*3/uL (ref 1.5–6.5)
NEUT%: 62.5 % (ref 39.0–75.0)
WBC: 10 10*3/uL (ref 4.0–10.3)
lymph#: 2 10*3/uL (ref 0.9–3.3)

## 2010-05-25 LAB — COMPREHENSIVE METABOLIC PANEL
Alkaline Phosphatase: 170 U/L — ABNORMAL HIGH (ref 39–117)
BUN: 12 mg/dL (ref 6–23)
CO2: 21 mEq/L (ref 19–32)
Calcium: 8.6 mg/dL (ref 8.4–10.5)
Chloride: 105 mEq/L (ref 96–112)
Creatinine, Ser: 0.89 mg/dL (ref 0.40–1.50)
Glucose, Bld: 164 mg/dL — ABNORMAL HIGH (ref 70–99)
Potassium: 4 mEq/L (ref 3.5–5.3)
Total Bilirubin: 0.4 mg/dL (ref 0.3–1.2)

## 2010-05-25 LAB — LACTATE DEHYDROGENASE: LDH: 144 U/L (ref 94–250)

## 2010-05-25 LAB — SEDIMENTATION RATE: Sed Rate: 64 mm/hr — ABNORMAL HIGH (ref 0–16)

## 2010-05-25 LAB — URIC ACID: Uric Acid, Serum: 4.9 mg/dL (ref 4.0–7.8)

## 2010-05-31 LAB — CBC WITH DIFFERENTIAL/PLATELET
BASO%: 0.6 % (ref 0.0–2.0)
Basophils Absolute: 0.1 10*3/uL (ref 0.0–0.1)
EOS%: 5.4 % (ref 0.0–7.0)
HGB: 10.9 g/dL — ABNORMAL LOW (ref 13.0–17.1)
MCH: 31.5 pg (ref 27.2–33.4)
MCHC: 31.8 g/dL — ABNORMAL LOW (ref 32.0–36.0)
MCV: 99.1 fL — ABNORMAL HIGH (ref 79.3–98.0)
MONO%: 12.9 % (ref 0.0–14.0)
RBC: 3.46 10*6/uL — ABNORMAL LOW (ref 4.20–5.82)
RDW: 18 % — ABNORMAL HIGH (ref 11.0–14.6)

## 2010-06-11 ENCOUNTER — Ambulatory Visit: Payer: Self-pay | Admitting: Oncology

## 2010-06-15 LAB — CBC WITH DIFFERENTIAL/PLATELET
Basophils Absolute: 0.1 10*3/uL (ref 0.0–0.1)
Eosinophils Absolute: 0.2 10*3/uL (ref 0.0–0.5)
HCT: 38.5 % (ref 38.4–49.9)
HGB: 12.2 g/dL — ABNORMAL LOW (ref 13.0–17.1)
LYMPH%: 35 % (ref 14.0–49.0)
MCHC: 31.7 g/dL — ABNORMAL LOW (ref 32.0–36.0)
MONO#: 0.7 10*3/uL (ref 0.1–0.9)
NEUT#: 2.7 10*3/uL (ref 1.5–6.5)
NEUT%: 46.7 % (ref 39.0–75.0)
Platelets: 300 10*3/uL (ref 140–400)
WBC: 5.7 10*3/uL (ref 4.0–10.3)
lymph#: 2 10*3/uL (ref 0.9–3.3)

## 2010-06-15 LAB — COMPREHENSIVE METABOLIC PANEL
ALT: <8 U/L (ref 0–53)
AST: 12 U/L (ref 0–37)
Albumin: 3.9 g/dL (ref 3.5–5.2)
Alkaline Phosphatase: 149 U/L — ABNORMAL HIGH (ref 39–117)
Calcium: 9.1 mg/dL (ref 8.4–10.5)
Chloride: 105 mEq/L (ref 96–112)
Potassium: 4.1 mEq/L (ref 3.5–5.3)
Sodium: 137 mEq/L (ref 135–145)
Total Protein: 6.4 g/dL (ref 6.0–8.3)

## 2010-06-15 LAB — LACTATE DEHYDROGENASE: LDH: 144 U/L (ref 94–250)

## 2010-07-06 LAB — CBC WITH DIFFERENTIAL/PLATELET
Basophils Absolute: 0.1 10*3/uL (ref 0.0–0.1)
EOS%: 1.9 % (ref 0.0–7.0)
Eosinophils Absolute: 0.1 10*3/uL (ref 0.0–0.5)
HCT: 41 % (ref 38.4–49.9)
HGB: 13.4 g/dL (ref 13.0–17.1)
MCH: 32 pg (ref 27.2–33.4)
MCV: 98.2 fL — ABNORMAL HIGH (ref 79.3–98.0)
MONO%: 11.5 % (ref 0.0–14.0)
NEUT#: 2.9 10*3/uL (ref 1.5–6.5)
NEUT%: 52.2 % (ref 39.0–75.0)
Platelets: 319 10*3/uL (ref 140–400)
RDW: 19.4 % — ABNORMAL HIGH (ref 11.0–14.6)

## 2010-07-07 LAB — COMPREHENSIVE METABOLIC PANEL
AST: 12 U/L (ref 0–37)
Albumin: 3.9 g/dL (ref 3.5–5.2)
Alkaline Phosphatase: 145 U/L — ABNORMAL HIGH (ref 39–117)
BUN: 9 mg/dL (ref 6–23)
Calcium: 9.1 mg/dL (ref 8.4–10.5)
Creatinine, Ser: 0.85 mg/dL (ref 0.40–1.50)
Glucose, Bld: 111 mg/dL — ABNORMAL HIGH (ref 70–99)
Potassium: 3.8 mEq/L (ref 3.5–5.3)

## 2010-07-07 LAB — SEDIMENTATION RATE: Sed Rate: 28 mm/hr — ABNORMAL HIGH (ref 0–16)

## 2010-07-07 LAB — URIC ACID: Uric Acid, Serum: 5 mg/dL (ref 4.0–7.8)

## 2010-07-19 ENCOUNTER — Ambulatory Visit (HOSPITAL_COMMUNITY)
Admission: RE | Admit: 2010-07-19 | Discharge: 2010-07-19 | Payer: Self-pay | Source: Home / Self Care | Admitting: Oncology

## 2010-07-23 ENCOUNTER — Ambulatory Visit: Payer: Self-pay | Admitting: Oncology

## 2010-07-23 ENCOUNTER — Encounter
Admission: AD | Admit: 2010-07-23 | Discharge: 2010-07-23 | Payer: Self-pay | Source: Home / Self Care | Attending: Dentistry | Admitting: Dentistry

## 2010-07-27 LAB — CBC WITH DIFFERENTIAL/PLATELET
BASO%: 0.4 % (ref 0.0–2.0)
Basophils Absolute: 0 10*3/uL (ref 0.0–0.1)
HCT: 42 % (ref 38.4–49.9)
HGB: 13.9 g/dL (ref 13.0–17.1)
LYMPH%: 41.2 % (ref 14.0–49.0)
MCHC: 33 g/dL (ref 32.0–36.0)
MONO#: 0.7 10*3/uL (ref 0.1–0.9)
NEUT%: 43.6 % (ref 39.0–75.0)
Platelets: 361 10*3/uL (ref 140–400)
WBC: 5.1 10*3/uL (ref 4.0–10.3)

## 2010-07-27 LAB — COMPREHENSIVE METABOLIC PANEL
AST: 10 U/L (ref 0–37)
Albumin: 4.2 g/dL (ref 3.5–5.2)
BUN: 13 mg/dL (ref 6–23)
CO2: 26 mEq/L (ref 19–32)
Calcium: 9.6 mg/dL (ref 8.4–10.5)
Chloride: 103 mEq/L (ref 96–112)
Creatinine, Ser: 0.96 mg/dL (ref 0.40–1.50)
Glucose, Bld: 100 mg/dL — ABNORMAL HIGH (ref 70–99)

## 2010-07-27 LAB — SEDIMENTATION RATE: Sed Rate: 42 mm/hr — ABNORMAL HIGH (ref 0–16)

## 2010-07-29 ENCOUNTER — Ambulatory Visit (HOSPITAL_COMMUNITY)
Admission: RE | Admit: 2010-07-29 | Discharge: 2010-07-29 | Payer: Self-pay | Source: Home / Self Care | Attending: Dentistry | Admitting: Dentistry

## 2010-08-05 ENCOUNTER — Encounter (HOSPITAL_COMMUNITY): Payer: Self-pay | Admitting: Oncology

## 2010-08-05 ENCOUNTER — Ambulatory Visit
Admission: RE | Admit: 2010-08-05 | Discharge: 2010-08-05 | Payer: Self-pay | Source: Home / Self Care | Attending: Oncology | Admitting: Oncology

## 2010-08-05 ENCOUNTER — Ambulatory Visit (HOSPITAL_COMMUNITY)
Admission: RE | Admit: 2010-08-05 | Discharge: 2010-08-05 | Payer: Self-pay | Source: Home / Self Care | Attending: Oncology | Admitting: Oncology

## 2010-08-20 LAB — CBC WITH DIFFERENTIAL/PLATELET
BASO%: 0.6 % (ref 0.0–2.0)
Basophils Absolute: 0.1 10*3/uL (ref 0.0–0.1)
EOS%: 0.9 % (ref 0.0–7.0)
Eosinophils Absolute: 0.1 10*3/uL (ref 0.0–0.5)
HCT: 38.4 % (ref 38.4–49.9)
HGB: 12.8 g/dL — ABNORMAL LOW (ref 13.0–17.1)
LYMPH%: 26.4 % (ref 14.0–49.0)
MCH: 30.8 pg (ref 27.2–33.4)
MCHC: 33.3 g/dL (ref 32.0–36.0)
MCV: 92.3 fL (ref 79.3–98.0)
MONO#: 0.9 10*3/uL (ref 0.1–0.9)
MONO%: 9.3 % (ref 0.0–14.0)
NEUT#: 6.2 10*3/uL (ref 1.5–6.5)
NEUT%: 62.8 % (ref 39.0–75.0)
Platelets: 339 10*3/uL (ref 140–400)
RBC: 4.16 10*6/uL — ABNORMAL LOW (ref 4.20–5.82)
RDW: 18.2 % — ABNORMAL HIGH (ref 11.0–14.6)
WBC: 9.8 10*3/uL (ref 4.0–10.3)
lymph#: 2.6 10*3/uL (ref 0.9–3.3)

## 2010-08-20 LAB — COMPREHENSIVE METABOLIC PANEL
ALT: 9 U/L (ref 0–53)
AST: 17 U/L (ref 0–37)
Albumin: 4.5 g/dL (ref 3.5–5.2)
Alkaline Phosphatase: 185 U/L — ABNORMAL HIGH (ref 39–117)
BUN: 6 mg/dL (ref 6–23)
CO2: 22 mEq/L (ref 19–32)
Calcium: 9.7 mg/dL (ref 8.4–10.5)
Chloride: 104 mEq/L (ref 96–112)
Creatinine, Ser: 0.84 mg/dL (ref 0.40–1.50)
Glucose, Bld: 74 mg/dL (ref 70–99)
Potassium: 4.1 mEq/L (ref 3.5–5.3)
Sodium: 139 mEq/L (ref 135–145)
Total Bilirubin: 0.7 mg/dL (ref 0.3–1.2)
Total Protein: 7.2 g/dL (ref 6.0–8.3)

## 2010-08-20 LAB — SEDIMENTATION RATE: Sed Rate: 45 mm/hr — ABNORMAL HIGH (ref 0–16)

## 2010-08-20 LAB — LACTATE DEHYDROGENASE: LDH: 148 U/L (ref 94–250)

## 2010-09-07 ENCOUNTER — Ambulatory Visit: Payer: Self-pay | Admitting: Oncology

## 2010-09-09 LAB — CBC WITH DIFFERENTIAL/PLATELET
MCH: 30.9 pg (ref 27.2–33.4)
MCV: 92.7 fL (ref 79.3–98.0)
NEUT%: 61.5 % (ref 39.0–75.0)
Platelets: 395 10*3/uL (ref 140–400)
RBC: 3.95 10*6/uL — ABNORMAL LOW (ref 4.20–5.82)
WBC: 8.5 10*3/uL (ref 4.0–10.3)
lymph#: 2.3 10*3/uL (ref 0.9–3.3)

## 2010-09-09 LAB — COMPREHENSIVE METABOLIC PANEL
AST: 13 U/L (ref 0–37)
Albumin: 3.8 g/dL (ref 3.5–5.2)
Alkaline Phosphatase: 180 U/L — ABNORMAL HIGH (ref 39–117)
BUN: 9 mg/dL (ref 6–23)
CO2: 21 mEq/L (ref 19–32)
Chloride: 104 mEq/L (ref 96–112)
Creatinine, Ser: 0.69 mg/dL (ref 0.40–1.50)
Potassium: 3.9 mEq/L (ref 3.5–5.3)
Total Bilirubin: 0.5 mg/dL (ref 0.3–1.2)
Total Protein: 6.4 g/dL (ref 6.0–8.3)

## 2010-09-09 LAB — SEDIMENTATION RATE: Sed Rate: 51 mm/hr — ABNORMAL HIGH (ref 0–16)

## 2010-09-28 ENCOUNTER — Other Ambulatory Visit (HOSPITAL_COMMUNITY): Payer: Self-pay | Admitting: Oncology

## 2010-09-28 ENCOUNTER — Encounter (HOSPITAL_BASED_OUTPATIENT_CLINIC_OR_DEPARTMENT_OTHER): Payer: Self-pay | Admitting: Oncology

## 2010-09-28 DIAGNOSIS — D649 Anemia, unspecified: Secondary | ICD-10-CM

## 2010-09-28 DIAGNOSIS — C8193 Hodgkin lymphoma, unspecified, intra-abdominal lymph nodes: Secondary | ICD-10-CM

## 2010-09-28 DIAGNOSIS — C8190 Hodgkin lymphoma, unspecified, unspecified site: Secondary | ICD-10-CM

## 2010-09-28 DIAGNOSIS — Z5112 Encounter for antineoplastic immunotherapy: Secondary | ICD-10-CM

## 2010-09-28 LAB — COMPREHENSIVE METABOLIC PANEL WITH GFR
ALT: <8 U/L (ref 0–53)
AST: 17 U/L (ref 0–37)
Albumin: 4.3 g/dL (ref 3.5–5.2)
Alkaline Phosphatase: 188 U/L — ABNORMAL HIGH (ref 39–117)
BUN: 9 mg/dL (ref 6–23)
CO2: 24 meq/L (ref 19–32)
Calcium: 9.3 mg/dL (ref 8.4–10.5)
Chloride: 100 meq/L (ref 96–112)
Creatinine, Ser: 0.76 mg/dL (ref 0.40–1.50)
Glucose, Bld: 89 mg/dL (ref 70–99)
Potassium: 3.6 meq/L (ref 3.5–5.3)
Sodium: 139 meq/L (ref 135–145)
Total Bilirubin: 0.8 mg/dL (ref 0.3–1.2)
Total Protein: 7 g/dL (ref 6.0–8.3)

## 2010-09-28 LAB — CBC WITH DIFFERENTIAL/PLATELET
BASO%: 0.7 % (ref 0.0–2.0)
LYMPH%: 23.2 % (ref 14.0–49.0)
MCHC: 32.2 g/dL (ref 32.0–36.0)
MONO#: 0.7 10*3/uL (ref 0.1–0.9)
MONO%: 7.5 % (ref 0.0–14.0)
Platelets: 446 10*3/uL — ABNORMAL HIGH (ref 140–400)
RBC: 4.42 10*6/uL (ref 4.20–5.82)
RDW: 20.3 % — ABNORMAL HIGH (ref 11.0–14.6)
WBC: 9.6 10*3/uL (ref 4.0–10.3)

## 2010-09-28 LAB — URIC ACID: Uric Acid, Serum: 5.7 mg/dL (ref 4.0–7.8)

## 2010-09-30 ENCOUNTER — Encounter (HOSPITAL_BASED_OUTPATIENT_CLINIC_OR_DEPARTMENT_OTHER): Payer: Self-pay | Admitting: Oncology

## 2010-09-30 DIAGNOSIS — Z5112 Encounter for antineoplastic immunotherapy: Secondary | ICD-10-CM

## 2010-09-30 DIAGNOSIS — C8193 Hodgkin lymphoma, unspecified, intra-abdominal lymph nodes: Secondary | ICD-10-CM

## 2010-10-19 ENCOUNTER — Encounter (HOSPITAL_BASED_OUTPATIENT_CLINIC_OR_DEPARTMENT_OTHER): Payer: Self-pay | Admitting: Oncology

## 2010-10-19 ENCOUNTER — Other Ambulatory Visit (HOSPITAL_COMMUNITY): Payer: Self-pay | Admitting: Oncology

## 2010-10-19 DIAGNOSIS — Z5112 Encounter for antineoplastic immunotherapy: Secondary | ICD-10-CM

## 2010-10-19 DIAGNOSIS — C8193 Hodgkin lymphoma, unspecified, intra-abdominal lymph nodes: Secondary | ICD-10-CM

## 2010-10-19 LAB — COMPREHENSIVE METABOLIC PANEL
ALT: <8 U/L (ref 0–53)
AST: 11 U/L (ref 0–37)
Albumin: 4 g/dL (ref 3.5–5.2)
CO2: 24 mEq/L (ref 19–32)
Calcium: 9.2 mg/dL (ref 8.4–10.5)
Chloride: 101 mEq/L (ref 96–112)
Potassium: 3.4 mEq/L — ABNORMAL LOW (ref 3.5–5.3)
Sodium: 138 mEq/L (ref 135–145)
Total Protein: 6.4 g/dL (ref 6.0–8.3)

## 2010-10-19 LAB — CBC WITH DIFFERENTIAL/PLATELET
BASO%: 0.1 % (ref 0.0–2.0)
EOS%: 1.2 % (ref 0.0–7.0)
MCH: 30.5 pg (ref 27.2–33.4)
MCHC: 32.6 g/dL (ref 32.0–36.0)
MONO#: 0.8 10*3/uL (ref 0.1–0.9)
RDW: 20.6 % — ABNORMAL HIGH (ref 11.0–14.6)
WBC: 11.4 10*3/uL — ABNORMAL HIGH (ref 4.0–10.3)
lymph#: 1.7 10*3/uL (ref 0.9–3.3)

## 2010-10-21 ENCOUNTER — Encounter (HOSPITAL_BASED_OUTPATIENT_CLINIC_OR_DEPARTMENT_OTHER): Payer: Medicaid Other | Admitting: Oncology

## 2010-10-21 DIAGNOSIS — Z5112 Encounter for antineoplastic immunotherapy: Secondary | ICD-10-CM

## 2010-10-21 DIAGNOSIS — C8193 Hodgkin lymphoma, unspecified, intra-abdominal lymph nodes: Secondary | ICD-10-CM

## 2010-10-25 LAB — GLUCOSE, CAPILLARY: Glucose-Capillary: 128 mg/dL — ABNORMAL HIGH (ref 70–99)

## 2010-11-01 ENCOUNTER — Other Ambulatory Visit (HOSPITAL_COMMUNITY): Payer: Self-pay | Admitting: Oncology

## 2010-11-02 ENCOUNTER — Other Ambulatory Visit (HOSPITAL_COMMUNITY): Payer: Self-pay | Admitting: Oncology

## 2010-11-02 ENCOUNTER — Ambulatory Visit (HOSPITAL_COMMUNITY): Payer: Medicaid Other | Attending: Oncology

## 2010-11-02 DIAGNOSIS — C8190 Hodgkin lymphoma, unspecified, unspecified site: Secondary | ICD-10-CM

## 2010-11-02 DIAGNOSIS — C8193 Hodgkin lymphoma, unspecified, intra-abdominal lymph nodes: Secondary | ICD-10-CM | POA: Insufficient documentation

## 2010-11-02 LAB — CBC
HCT: 31.2 % — ABNORMAL LOW (ref 39.0–52.0)
MCH: 29.2 pg (ref 26.0–34.0)
MCHC: 32.7 g/dL (ref 30.0–36.0)
MCV: 89.4 fL (ref 78.0–100.0)
Platelets: 467 10*3/uL — ABNORMAL HIGH (ref 150–400)
RDW: 19.4 % — ABNORMAL HIGH (ref 11.5–15.5)

## 2010-11-02 LAB — DIFFERENTIAL
Basophils Absolute: 0 10*3/uL (ref 0.0–0.1)
Basophils Relative: 0 % (ref 0–1)
Eosinophils Absolute: 0.2 10*3/uL (ref 0.0–0.7)
Lymphs Abs: 2.7 10*3/uL (ref 0.7–4.0)
Neutro Abs: 17.8 10*3/uL — ABNORMAL HIGH (ref 1.7–7.7)

## 2010-11-03 LAB — GLUCOSE, CAPILLARY: Glucose-Capillary: 92 mg/dL (ref 70–99)

## 2010-11-04 ENCOUNTER — Ambulatory Visit (HOSPITAL_COMMUNITY)
Admission: RE | Admit: 2010-11-04 | Discharge: 2010-11-04 | Disposition: A | Discharge status: Home / Self Care | Payer: Medicaid Other | Source: Ambulatory Visit | Attending: Oncology | Admitting: Oncology

## 2010-11-04 ENCOUNTER — Other Ambulatory Visit (HOSPITAL_COMMUNITY): Payer: Self-pay | Admitting: Oncology

## 2010-11-04 DIAGNOSIS — C8589 Other specified types of non-Hodgkin lymphoma, extranodal and solid organ sites: Secondary | ICD-10-CM | POA: Insufficient documentation

## 2010-11-04 DIAGNOSIS — R0989 Other specified symptoms and signs involving the circulatory and respiratory systems: Secondary | ICD-10-CM | POA: Insufficient documentation

## 2010-11-04 DIAGNOSIS — Z09 Encounter for follow-up examination after completed treatment for conditions other than malignant neoplasm: Secondary | ICD-10-CM

## 2010-11-04 DIAGNOSIS — F17200 Nicotine dependence, unspecified, uncomplicated: Secondary | ICD-10-CM | POA: Insufficient documentation

## 2010-11-04 DIAGNOSIS — Q248 Other specified congenital malformations of heart: Secondary | ICD-10-CM | POA: Insufficient documentation

## 2010-11-04 DIAGNOSIS — R0609 Other forms of dyspnea: Secondary | ICD-10-CM | POA: Insufficient documentation

## 2010-11-08 ENCOUNTER — Ambulatory Visit (HOSPITAL_COMMUNITY)
Admission: RE | Admit: 2010-11-08 | Discharge: 2010-11-08 | Disposition: A | Discharge status: Home / Self Care | Payer: Medicaid Other | Source: Ambulatory Visit | Attending: Oncology | Admitting: Oncology

## 2010-11-08 ENCOUNTER — Encounter (HOSPITAL_COMMUNITY): Payer: Self-pay

## 2010-11-08 DIAGNOSIS — C8590 Non-Hodgkin lymphoma, unspecified, unspecified site: Secondary | ICD-10-CM

## 2010-11-08 DIAGNOSIS — C8589 Other specified types of non-Hodgkin lymphoma, extranodal and solid organ sites: Secondary | ICD-10-CM | POA: Insufficient documentation

## 2010-11-08 DIAGNOSIS — C8588 Other specified types of non-Hodgkin lymphoma, lymph nodes of multiple sites: Secondary | ICD-10-CM | POA: Insufficient documentation

## 2010-11-08 HISTORY — DX: Malignant (primary) neoplasm, unspecified: C80.1

## 2010-11-08 MED ORDER — FLUDEOXYGLUCOSE F - 18 (FDG) INJECTION
16.4000 | Freq: Once | INTRAVENOUS | Status: AC | PRN
  Administered 2010-11-08: 16.4 via INTRAVENOUS

## 2010-11-09 ENCOUNTER — Ambulatory Visit (HOSPITAL_COMMUNITY): Payer: Medicaid Other

## 2010-11-09 ENCOUNTER — Encounter (HOSPITAL_COMMUNITY): Payer: Self-pay

## 2010-11-10 ENCOUNTER — Other Ambulatory Visit (HOSPITAL_COMMUNITY): Payer: Self-pay

## 2010-11-11 ENCOUNTER — Other Ambulatory Visit (HOSPITAL_COMMUNITY): Payer: Self-pay | Admitting: Oncology

## 2010-11-11 ENCOUNTER — Encounter (HOSPITAL_BASED_OUTPATIENT_CLINIC_OR_DEPARTMENT_OTHER): Payer: Medicaid Other | Admitting: Oncology

## 2010-11-11 DIAGNOSIS — C8193 Hodgkin lymphoma, unspecified, intra-abdominal lymph nodes: Secondary | ICD-10-CM

## 2010-11-11 LAB — CBC WITH DIFFERENTIAL/PLATELET
Basophils Absolute: 0.1 10*3/uL (ref 0.0–0.1)
EOS%: 0.4 % (ref 0.0–7.0)
Eosinophils Absolute: 0.1 10*3/uL (ref 0.0–0.5)
HCT: 34.5 % — ABNORMAL LOW (ref 38.4–49.9)
HGB: 11.2 g/dL — ABNORMAL LOW (ref 13.0–17.1)
MCH: 28.1 pg (ref 27.2–33.4)
MCV: 86.7 fL (ref 79.3–98.0)
MONO%: 4.9 % (ref 0.0–14.0)
NEUT#: 31.4 10*3/uL — ABNORMAL HIGH (ref 1.5–6.5)
NEUT%: 87.4 % — ABNORMAL HIGH (ref 39.0–75.0)
RDW: 19.4 % — ABNORMAL HIGH (ref 11.0–14.6)
lymph#: 2.5 10*3/uL (ref 0.9–3.3)

## 2010-11-11 LAB — COMPREHENSIVE METABOLIC PANEL
ALT: <8 U/L (ref 0–53)
Albumin: 3.7 g/dL (ref 3.5–5.2)
CO2: 22 mEq/L (ref 19–32)
Calcium: 9 mg/dL (ref 8.4–10.5)
Chloride: 100 mEq/L (ref 96–112)
Glucose, Bld: 122 mg/dL — ABNORMAL HIGH (ref 70–99)
Sodium: 136 mEq/L (ref 135–145)
Total Protein: 6.5 g/dL (ref 6.0–8.3)

## 2010-11-11 LAB — LACTATE DEHYDROGENASE: LDH: 160 U/L (ref 94–250)

## 2010-11-11 LAB — TECHNOLOGIST REVIEW

## 2010-11-11 LAB — URIC ACID: Uric Acid, Serum: 4.2 mg/dL (ref 4.0–7.8)

## 2010-11-16 ENCOUNTER — Encounter (HOSPITAL_BASED_OUTPATIENT_CLINIC_OR_DEPARTMENT_OTHER): Payer: Medicaid Other | Admitting: Oncology

## 2010-11-16 DIAGNOSIS — C8193 Hodgkin lymphoma, unspecified, intra-abdominal lymph nodes: Secondary | ICD-10-CM

## 2010-11-16 DIAGNOSIS — Z5111 Encounter for antineoplastic chemotherapy: Secondary | ICD-10-CM

## 2010-11-17 ENCOUNTER — Encounter (HOSPITAL_BASED_OUTPATIENT_CLINIC_OR_DEPARTMENT_OTHER): Payer: Medicaid Other | Admitting: Oncology

## 2010-11-17 DIAGNOSIS — C8193 Hodgkin lymphoma, unspecified, intra-abdominal lymph nodes: Secondary | ICD-10-CM

## 2010-11-17 DIAGNOSIS — Z5111 Encounter for antineoplastic chemotherapy: Secondary | ICD-10-CM

## 2010-11-17 DIAGNOSIS — Z5112 Encounter for antineoplastic immunotherapy: Secondary | ICD-10-CM

## 2010-11-18 ENCOUNTER — Encounter (HOSPITAL_BASED_OUTPATIENT_CLINIC_OR_DEPARTMENT_OTHER): Payer: Medicaid Other | Admitting: Oncology

## 2010-11-18 DIAGNOSIS — C8193 Hodgkin lymphoma, unspecified, intra-abdominal lymph nodes: Secondary | ICD-10-CM

## 2010-11-18 DIAGNOSIS — Z5189 Encounter for other specified aftercare: Secondary | ICD-10-CM

## 2010-11-19 ENCOUNTER — Emergency Department (HOSPITAL_COMMUNITY): Payer: Medicaid Other

## 2010-11-19 ENCOUNTER — Inpatient Hospital Stay (HOSPITAL_COMMUNITY)
Admission: EM | Admit: 2010-11-19 | Discharge: 2010-11-24 | DRG: 871 | Disposition: A | Discharge status: Home / Self Care | Payer: Medicaid Other | Attending: Internal Medicine | Admitting: Internal Medicine

## 2010-11-19 DIAGNOSIS — Z9221 Personal history of antineoplastic chemotherapy: Secondary | ICD-10-CM

## 2010-11-19 DIAGNOSIS — C8190 Hodgkin lymphoma, unspecified, unspecified site: Secondary | ICD-10-CM | POA: Diagnosis present

## 2010-11-19 DIAGNOSIS — R5081 Fever presenting with conditions classified elsewhere: Secondary | ICD-10-CM | POA: Diagnosis present

## 2010-11-19 DIAGNOSIS — R059 Cough, unspecified: Secondary | ICD-10-CM | POA: Diagnosis present

## 2010-11-19 DIAGNOSIS — R112 Nausea with vomiting, unspecified: Secondary | ICD-10-CM | POA: Diagnosis present

## 2010-11-19 DIAGNOSIS — T451X5A Adverse effect of antineoplastic and immunosuppressive drugs, initial encounter: Secondary | ICD-10-CM | POA: Diagnosis present

## 2010-11-19 DIAGNOSIS — I10 Essential (primary) hypertension: Secondary | ICD-10-CM | POA: Diagnosis present

## 2010-11-19 DIAGNOSIS — E876 Hypokalemia: Secondary | ICD-10-CM | POA: Diagnosis present

## 2010-11-19 DIAGNOSIS — D61810 Antineoplastic chemotherapy induced pancytopenia: Secondary | ICD-10-CM | POA: Diagnosis present

## 2010-11-19 DIAGNOSIS — R05 Cough: Secondary | ICD-10-CM | POA: Diagnosis present

## 2010-11-19 DIAGNOSIS — F17200 Nicotine dependence, unspecified, uncomplicated: Secondary | ICD-10-CM | POA: Diagnosis present

## 2010-11-19 DIAGNOSIS — A419 Sepsis, unspecified organism: Principal | ICD-10-CM | POA: Diagnosis present

## 2010-11-19 LAB — URINALYSIS, ROUTINE W REFLEX MICROSCOPIC
Bilirubin Urine: NEGATIVE
Glucose, UA: NEGATIVE mg/dL
Hgb urine dipstick: NEGATIVE
Ketones, ur: NEGATIVE mg/dL
Protein, ur: 100 mg/dL — AB

## 2010-11-19 LAB — DIFFERENTIAL
Basophils Absolute: 0 10*3/uL (ref 0.0–0.1)
Eosinophils Absolute: 0 10*3/uL (ref 0.0–0.7)
Eosinophils Relative: 0 % (ref 0–5)
Lymphs Abs: 0.7 10*3/uL (ref 0.7–4.0)
Monocytes Absolute: 2.1 10*3/uL — ABNORMAL HIGH (ref 0.1–1.0)
Neutrophils Relative %: 96 % — ABNORMAL HIGH (ref 43–77)

## 2010-11-19 LAB — POCT I-STAT, CHEM 8
Chloride: 101 mEq/L (ref 96–112)
Creatinine, Ser: 1.3 mg/dL (ref 0.4–1.5)
HCT: 38 % — ABNORMAL LOW (ref 39.0–52.0)
Hemoglobin: 12.9 g/dL — ABNORMAL LOW (ref 13.0–17.0)
Potassium: 3.6 mEq/L (ref 3.5–5.1)
Sodium: 137 mEq/L (ref 135–145)

## 2010-11-19 LAB — CROSSMATCH

## 2010-11-19 LAB — COMPREHENSIVE METABOLIC PANEL
AST: 22 U/L (ref 0–37)
Albumin: 2.9 g/dL — ABNORMAL LOW (ref 3.5–5.2)
Alkaline Phosphatase: 217 U/L — ABNORMAL HIGH (ref 39–117)
BUN: 15 mg/dL (ref 6–23)
GFR calc Af Amer: >60 mL/min (ref >60)
Potassium: 3.8 mEq/L (ref 3.5–5.1)
Total Protein: 6.8 g/dL (ref 6.0–8.3)

## 2010-11-19 LAB — CBC
MCV: 86.5 fL (ref 78.0–100.0)
Platelets: 525 10*3/uL — ABNORMAL HIGH (ref 150–400)
RDW: 19.5 % — ABNORMAL HIGH (ref 11.5–15.5)
WBC: 70.5 10*3/uL (ref 4.0–10.5)

## 2010-11-19 LAB — URINE MICROSCOPIC-ADD ON

## 2010-11-19 LAB — PROTIME-INR
INR: 1.14 (ref 0.00–1.49)
Prothrombin Time: 14.8 seconds (ref 11.6–15.2)

## 2010-11-19 LAB — POCT CARDIAC MARKERS
CKMB, poc: <1 ng/mL — ABNORMAL LOW (ref 1.0–8.0)
Myoglobin, poc: 123 ng/mL (ref 12–200)

## 2010-11-20 ENCOUNTER — Inpatient Hospital Stay (HOSPITAL_COMMUNITY): Payer: Medicaid Other

## 2010-11-20 DIAGNOSIS — A419 Sepsis, unspecified organism: Secondary | ICD-10-CM

## 2010-11-20 DIAGNOSIS — C888 Other malignant immunoproliferative diseases: Secondary | ICD-10-CM

## 2010-11-20 LAB — COMPREHENSIVE METABOLIC PANEL
AST: 16 U/L (ref 0–37)
Albumin: 2.2 g/dL — ABNORMAL LOW (ref 3.5–5.2)
CO2: 23 mEq/L (ref 19–32)
Calcium: 7.7 mg/dL — ABNORMAL LOW (ref 8.4–10.5)
Creatinine, Ser: 1 mg/dL (ref 0.4–1.5)
GFR calc Af Amer: >60 mL/min (ref >60)
GFR calc non Af Amer: >60 mL/min (ref >60)

## 2010-11-20 LAB — PROTIME-INR: Prothrombin Time: 15.7 seconds — ABNORMAL HIGH (ref 11.6–15.2)

## 2010-11-20 LAB — CBC
HCT: 26 % — ABNORMAL LOW (ref 39.0–52.0)
Hemoglobin: 8.3 g/dL — ABNORMAL LOW (ref 13.0–17.0)
RBC: 3.02 MIL/uL — ABNORMAL LOW (ref 4.22–5.81)
WBC: 55.5 10*3/uL (ref 4.0–10.5)

## 2010-11-20 LAB — DIFFERENTIAL
Basophils Relative: 0 % (ref 0–1)
Eosinophils Relative: 0 % (ref 0–5)
Lymphocytes Relative: 1 % — ABNORMAL LOW (ref 12–46)
Monocytes Relative: 2 % — ABNORMAL LOW (ref 3–12)
Neutrophils Relative %: 97 % — ABNORMAL HIGH (ref 43–77)

## 2010-11-20 LAB — BLOOD GAS, ARTERIAL
Acid-Base Excess: 0 mmol/L (ref 0.0–2.0)
Drawn by: 340271
FIO2: 0.28 %
O2 Saturation: 97.8 %
pO2, Arterial: 87.9 mmHg (ref 80.0–100.0)

## 2010-11-20 LAB — CARDIAC PANEL(CRET KIN+CKTOT+MB+TROPI)
CK, MB: 1.3 ng/mL (ref 0.3–4.0)
Relative Index: INVALID (ref 0.0–2.5)
Troponin I: 0.03 ng/mL (ref 0.00–0.06)

## 2010-11-20 LAB — BASIC METABOLIC PANEL
CO2: 25 mEq/L (ref 19–32)
Calcium: 7.7 mg/dL — ABNORMAL LOW (ref 8.4–10.5)
GFR calc Af Amer: >60 mL/min (ref >60)
Sodium: 140 mEq/L (ref 135–145)

## 2010-11-20 LAB — TYPE AND SCREEN
ABO/RH(D): O POS
Antibody Screen: NEGATIVE

## 2010-11-20 LAB — CARBOXYHEMOGLOBIN
Carboxyhemoglobin: 2.1 % — ABNORMAL HIGH (ref 0.5–1.5)
O2 Saturation: 67.8 %

## 2010-11-20 LAB — CORTISOL: Cortisol, Plasma: 16.8 ug/dL

## 2010-11-20 NOTE — H&P (Signed)
NAMEIGNAZIO, [name] NO.:  [id number]  MEDICAL RECORD NO.:  [id number]           PATIENT TYPE:  E  LOCATION:  WLED                         FACILITY:  [facility]  PHYSICIAN:  Della Goo, M.D. DATE OF BIRTH:  03-05-1951  DATE OF ADMISSION:  11/19/2010 DATE OF DISCHARGE:                             HISTORY & PHYSICAL   DATE OF ADMISSION:  November 19, 2010.  PRIMARY CARE PHYSICIAN:  [name], M.D.  ONCOLOGIST:  [name], M.D.  CHIEF COMPLAINT:  Fever, chills, weakness.  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  Patient has been undergoing chemotherapy treatment since and he has had chemotherapy treatments twice this week.  The last chemotherapy treatment was 2 days ago.  Patient presents to the Emergency Department secondary to complaints of weakness, fevers, chills over the past 24 hours along with nausea and vomiting.  Patient also has had a cough, which he is unable to produce sputum.  Patient has also suffered a fall over the past 24 hours secondary to his weakness. Patient also has had poor intake of foods and liquids.  His appetite has been poor over the past month.  Patient has not had any diarrhea.  PAST MEDICAL HISTORY:  Significant for: 1. Hodgkin's lymphoma diagnosed in March 2009. 2. Hypertension. 3. Dyslipidemia.  PAST SURGICAL HISTORY:  Multiple dental extractions and now edentulous.  MEDICATIONS:  At this time include: 1. Oxycodone. 2. Hydrocodone. 3. EMLA. 4. Compazine p.r.n.Marland Kitchen  ALLERGIES:  No known drug allergies.  Patient has an intolerance to ASPIRIN, which causes gastric irritation.  SOCIAL HISTORY:  Patient is a smoker.  He smokes a pack of cigarettes daily for 40 years or more.  He has a past history of alcohol and recently stopped  drinking one months ago.  No history of illicit drug usage.  FAMILY HISTORY:  Positive for cancer in his father, which was prostate cancer.  Positive for heart disease in his mother.  Two sisters that had breast cancer.  REVIEW OF SYSTEMS:  Pertinent as mentioned above in the HPI.  All other organ systems are negative.  PHYSICAL EXAMINATION FINDINGS:  GENERAL:  This is a 60 year old thin, well-nourished, well-developed African-American male who is in mild distress. VITAL SIGNS:  Temperature 101.2, blood pressure 84/48 initially, heart rate 129, respirations 16-20 and O2 saturations 93%-99%. HEENT EXAMINATION:  Normocephalic, atraumatic.  Pupils are equally round and reactive to light.  Extraocular movements are intact.  Funduscopic benign.  There is no scleral icterus.  Nares are patent bilaterally. Oropharynx is clear. NECK:  Supple.  Full range of motion.  No thyromegaly, adenopathy, jugular venous distention. CARDIOVASCULAR:  Tachycardiac rate and rhythm.  No murmurs, gallops or rubs appreciated. CHEST:  Chest wall, a Port-A-Cath is present.  The margins around the Port-A-Cath are nontender.  There is no erythema.  The chest wall excursion is symmetric.  Breathing is unlabored at this time. CARDIOVASCULAR:  Tachycardiac  rate and rhythm.  No murmurs, gallops or rubs appreciated. ABDOMEN:  Positive bowel sounds, soft, nontender, nondistended.  No hepatosplenomegaly. EXTREMITIES:  Without cyanosis, clubbing or edema. NEUROLOGIC EXAMINATION:  Generalized weakness, but there are no focal deficits on examination.  LABORATORY STUDIES:  White blood cell count 70.5K, hemoglobin 11.5, hematocrit 35.2, platelets 525,000, neutrophils 96%.  Sodium 136, potassium 3.8, chloride 97, carbon dioxide 27, BUN 15, creatinine 1.22 and glucose 140.  Lactic acid level 3.3.  Procalcitonin level 1.47. Protime 14.3.  INR 1.17.  PTT 38.  Urinalysis negative except urine total protein 100.  Point of  care cardiac markers with a myoglobin of 123, CK-MB less than 1.0 and troponin less than 0.05.  DIAGNOSTIC STUDIES:  Chest x-ray reveals no acute disease.  Port-A-Cath is present.  ASSESSMENT:  [age]-year-old male being admitted with: 1. Systemic inflammatory response syndrome, possible pneumonia. 2. Septic shock. 3. Lymphoma. 4. Leukocytosis most likely secondary to his lymphoma. 5. Mild anemia. 6. Nausea and vomiting. 7. Cough.  PLAN:  Patient will be admitted to the step-down ICU area.  He will be placed on the sepsis protocol.  Blood cultures x2 have been ordered and a urine culture and a cortisol level will also be added.  Fluid resuscitation has been performed and IV fluids for maintenance therapy will be continued.  Patient has been placed on empiric antibiotic therapy of vancomycin and Zosyn at this time.  His regular medications have been reconciled.  Code status has been discussed and the patient is a full code at this time.  A repeat chest x-ray will be performed in the a.m. after the patient has been adequately hydrated. The oncology service will be notified of the patient's admission.     Della Goo, M.D.     HJ/MEDQ  D:  11/19/2010  T:  11/19/2010  Job:  [id number]  cc:   [name], M.D. Fax: [phone]  [name], M.D. Fax: [phone]  Electronically Signed by Della Goo M.D. on 11/20/2010 07:39:45 PM

## 2010-11-21 DIAGNOSIS — D693 Immune thrombocytopenic purpura: Secondary | ICD-10-CM

## 2010-11-21 DIAGNOSIS — R509 Fever, unspecified: Secondary | ICD-10-CM

## 2010-11-21 LAB — CBC
HCT: 23.8 % — ABNORMAL LOW (ref 39.0–52.0)
HCT: 24 % — ABNORMAL LOW (ref 39.0–52.0)
HCT: 24.3 % — ABNORMAL LOW (ref 39.0–52.0)
HCT: 27.5 % — ABNORMAL LOW (ref 39.0–52.0)
Hemoglobin: 7.6 g/dL — ABNORMAL LOW (ref 13.0–17.0)
Hemoglobin: 9.4 g/dL — ABNORMAL LOW (ref 13.0–17.0)
MCHC: 31.9 g/dL (ref 30.0–36.0)
MCHC: 33.7 g/dL (ref 30.0–36.0)
MCHC: 34.3 g/dL (ref 30.0–36.0)
MCHC: 34.4 g/dL (ref 30.0–36.0)
MCV: 93.2 fL (ref 78.0–100.0)
Platelets: 66 10*3/uL — ABNORMAL LOW (ref 150–400)
Platelets: 69 10*3/uL — ABNORMAL LOW (ref 150–400)
RBC: 2.77 MIL/uL — ABNORMAL LOW (ref 4.22–5.81)
RBC: 2.94 MIL/uL — ABNORMAL LOW (ref 4.22–5.81)
RDW: 22.6 % — ABNORMAL HIGH (ref 11.5–15.5)
RDW: 22.7 % — ABNORMAL HIGH (ref 11.5–15.5)
RDW: 23.3 % — ABNORMAL HIGH (ref 11.5–15.5)
WBC: 51.3 10*3/uL (ref 4.0–10.5)

## 2010-11-21 LAB — BASIC METABOLIC PANEL
BUN: 15 mg/dL (ref 6–23)
CO2: 20 mEq/L (ref 19–32)
CO2: 20 mEq/L (ref 19–32)
Chloride: 112 mEq/L (ref 96–112)
Chloride: 106 mEq/L (ref 96–112)
GFR calc Af Amer: >60 mL/min (ref >60)
Glucose, Bld: 76 mg/dL (ref 70–99)
Glucose, Bld: 88 mg/dL (ref 70–99)
Potassium: 3.7 mEq/L (ref 3.5–5.1)
Potassium: 4.2 mEq/L (ref 3.5–5.1)
Sodium: 135 mEq/L (ref 135–145)

## 2010-11-21 LAB — DIFFERENTIAL
Basophils Absolute: 0 10*3/uL (ref 0.0–0.1)
Basophils Absolute: 0 10*3/uL (ref 0.0–0.1)
Basophils Relative: 1 % (ref 0–1)
Eosinophils Absolute: 0.1 10*3/uL (ref 0.0–0.7)
Eosinophils Absolute: 0.1 10*3/uL (ref 0.0–0.7)
Lymphocytes Relative: 21 % (ref 12–46)
Lymphs Abs: 0.7 10*3/uL (ref 0.7–4.0)
Monocytes Absolute: 0.2 10*3/uL (ref 0.1–1.0)
Neutro Abs: 2.2 10*3/uL (ref 1.7–7.7)
Neutro Abs: 4.3 10*3/uL (ref 1.7–7.7)
Neutrophils Relative %: 74 % (ref 43–77)

## 2010-11-21 LAB — CROSSMATCH: ABO/RH(D): O POS

## 2010-11-21 LAB — URINE CULTURE

## 2010-11-21 LAB — COMPREHENSIVE METABOLIC PANEL
AST: 14 U/L (ref 0–37)
Albumin: 3.5 g/dL (ref 3.5–5.2)
CO2: 19 mEq/L (ref 19–32)
Creatinine, Ser: 0.83 mg/dL (ref 0.4–1.5)
Glucose, Bld: 98 mg/dL (ref 70–99)
Potassium: 3.6 mEq/L (ref 3.5–5.1)
Sodium: 129 mEq/L — ABNORMAL LOW (ref 135–145)
Total Bilirubin: 1.5 mg/dL — ABNORMAL HIGH (ref 0.3–1.2)
Total Protein: 6.8 g/dL (ref 6.0–8.3)

## 2010-11-21 LAB — CULTURE, RESPIRATORY W GRAM STAIN: Culture: NORMAL

## 2010-11-21 LAB — CULTURE, BLOOD (ROUTINE X 2)
Culture: NO GROWTH
Culture: NO GROWTH

## 2010-11-21 LAB — EXPECTORATED SPUTUM ASSESSMENT W GRAM STAIN, RFLX TO RESP C

## 2010-11-22 LAB — CROSSMATCH

## 2010-11-22 LAB — VANCOMYCIN, TROUGH: Vancomycin Tr: 12.4 ug/mL (ref 10.0–20.0)

## 2010-11-22 LAB — CBC
HCT: 22 % — ABNORMAL LOW (ref 39.0–52.0)
Hemoglobin: 7.2 g/dL — ABNORMAL LOW (ref 13.0–17.0)
MCH: 27.6 pg (ref 26.0–34.0)
MCHC: 32.7 g/dL (ref 30.0–36.0)
MCV: 84.3 fL (ref 78.0–100.0)
RBC: 2.61 MIL/uL — ABNORMAL LOW (ref 4.22–5.81)

## 2010-11-23 DIAGNOSIS — I959 Hypotension, unspecified: Secondary | ICD-10-CM

## 2010-11-23 DIAGNOSIS — R509 Fever, unspecified: Secondary | ICD-10-CM

## 2010-11-23 DIAGNOSIS — C8190 Hodgkin lymphoma, unspecified, unspecified site: Secondary | ICD-10-CM

## 2010-11-23 LAB — CBC
HCT: 25.9 % — ABNORMAL LOW (ref 39.0–52.0)
Hemoglobin: 8.3 g/dL — ABNORMAL LOW (ref 13.0–17.0)
MCH: 27.2 pg (ref 26.0–34.0)
MCV: 84.9 fL (ref 78.0–100.0)
Platelets: 505 10*3/uL — ABNORMAL HIGH (ref 150–400)
RBC: 3.05 MIL/uL — ABNORMAL LOW (ref 4.22–5.81)
WBC: 35.8 10*3/uL — ABNORMAL HIGH (ref 4.0–10.5)

## 2010-11-25 LAB — CULTURE, BLOOD (ROUTINE X 2): Culture  Setup Time: 201204062324

## 2010-11-25 LAB — POCT I-STAT, CHEM 8
BUN: 8 mg/dL (ref 6–23)
Calcium, Ion: 1.06 mmol/L — ABNORMAL LOW (ref 1.12–1.32)
Chloride: 111 mEq/L (ref 96–112)
Glucose, Bld: 107 mg/dL — ABNORMAL HIGH (ref 70–99)
HCT: 40 % (ref 39.0–52.0)
Potassium: 3.2 mEq/L — ABNORMAL LOW (ref 3.5–5.1)

## 2010-11-26 LAB — CULTURE, BLOOD (ROUTINE X 2)
Culture  Setup Time: 201204070414
Culture: NO GROWTH

## 2010-11-30 ENCOUNTER — Encounter (HOSPITAL_BASED_OUTPATIENT_CLINIC_OR_DEPARTMENT_OTHER): Payer: Medicaid Other | Admitting: Oncology

## 2010-11-30 ENCOUNTER — Other Ambulatory Visit (HOSPITAL_COMMUNITY): Payer: Self-pay | Admitting: Oncology

## 2010-11-30 DIAGNOSIS — C8193 Hodgkin lymphoma, unspecified, intra-abdominal lymph nodes: Secondary | ICD-10-CM

## 2010-11-30 LAB — CBC WITH DIFFERENTIAL/PLATELET
Basophils Absolute: 0.1 10*3/uL (ref 0.0–0.1)
HCT: 28.5 % — ABNORMAL LOW (ref 38.4–49.9)
HGB: 9 g/dL — ABNORMAL LOW (ref 13.0–17.1)
LYMPH%: 5.9 % — ABNORMAL LOW (ref 14.0–49.0)
MCH: 27.1 pg — ABNORMAL LOW (ref 27.2–33.4)
MONO#: 1.9 10*3/uL — ABNORMAL HIGH (ref 0.1–0.9)
NEUT%: 84.5 % — ABNORMAL HIGH (ref 39.0–75.0)
Platelets: 576 10*3/uL — ABNORMAL HIGH (ref 140–400)
WBC: 22.4 10*3/uL — ABNORMAL HIGH (ref 4.0–10.3)
lymph#: 1.3 10*3/uL (ref 0.9–3.3)

## 2010-12-15 NOTE — Discharge Summary (Signed)
NAMEKHAALID, LEFKOWITZ NO.:  [id number]  MEDICAL RECORD NO.:  [id number]           PATIENT TYPE:  I  LOCATION:  1503                         FACILITY:  [facility]  PHYSICIAN:  Brendia Sacks, MD    DATE OF BIRTH:  [birth year]  DATE OF ADMISSION:  11/19/2010 DATE OF DISCHARGE:  11/24/2010                        DISCHARGE SUMMARY - REFERRING   PRIMARY CARE PHYSICIAN:  [name], M.D.  PRIMARY ONCOLOGIST:  [name], M.D.  CONDITION ON DISCHARGE:  Improved.  DISCHARGE DIAGNOSES: 1. Sepsis of unknown etiology. 2. Possible drug fever or fever related to Hodgkin's lymphoma. 3. Leukocytosis, thrombocytosis, and anemia secondary to chemotherapy     and Hodgkin's lymphoma. 4. Hodgkin's lymphoma.  Continue with chemotherapy as per Dr.     Arline Asp. 5. Hypertension.  This has remained stable. 6. Chronic back pain, stable.  HISTORY OF PRESENT ILLNESS:  This is a 61 year old man who is currently undergoing chemotherapy for Hodgkin's lymphoma, who presented to the emergency room with fever, chills, and weakness and was admitted for further evaluation.  HOSPITAL COURSE: 1. Sepsis of unknown etiology.  The patient was initially admitted to     the ICU with hypotension and sepsis.  He was placed on broad-     spectrum antibiotics and further investigation was conducted to     localized infection.  Cultures at this point remain negative.  The     patient is feeling better each day and he remains afebrile and     hemodynamically stable at this point.  Dr. Arline Asp has recommended     continuing following his cultures and discontinuing antibiotics.     Will discontinue antibiotics today if he remains afebrile for the     next 24-48 hours.  Would anticipate discharge home, perhaps. 2. Leukocytosis, thrombocytosis, and anemia.  Presumably secondary to     Hodgkin's lymphoma on chemotherapy.  Appears to be stable. 3. Hodgkin's lymphoma.  Continue further  chemotherapy as per Dr.     Arline Asp.  The patient expressed a desire of perhaps going to     [place].  Will defer recommendations to Dr. Arline Asp. 4. Hypertension.  This has remained stable. 5. Chronic back pain.  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  Flexeril was ineffective, but his chronic     pain medication has been effective.  CONSULTATIONS:  Dr. Arline Asp, Oncology, and Dr. Clelia Croft.  Recommendations as above.  PROCEDURES:  None.  IMAGING: 1. Chest x-ray, April 6:  No acute disease. 2. Chest x-ray, April 7:  Increasing airspace opacity in the left     lower lobe with associated elevation of the left hemidiaphragm     suggesting atelectasis.  MICROBIOLOGY: 1. Blood cultures x3, April 6:  No growth to date. 2. Urine culture:  No growth, final.  PERTINENT LABORATORY STUDIES: 1. CBC:  Notable lab for a white count of 70.5 on admission, platelet     count of 525, and hemoglobin of 12.9.  Day prior to discharge:     White blood cell count trending down nicely at 35.8, hemoglobin  8.3, and platelet count 505.  Note that his leukocytosis was felt     to be secondary to chemotherapy agents. 2. Basic metabolic panel essentially unremarkable. 3. Hepatic function panel notable for mild elevation of his total     bilirubin and alkaline phosphatase.  Alkaline phosphatase is     trending downwards and total bilirubin is now within normal limits.     Possibly related to chemotherapy. 4. Lactic acid was elevated at 3.3 on admission.  Cardiac markers and     one set of enzymes negative.  PHYSICAL EXAMINATION:  Exam for April 10. VITAL SIGNS:  The patient is feeling much better.  Temperature is 97.7, pulse 90, respirations 17, blood pressure 93/62.  Note that he does have a low normal blood pressure.  Sats 98% room air. CARDIOVASCULAR:  Regular rate and rhythm.  No murmur, rub, or gallop. RESPIRATORY:  Clear to auscultation bilaterally.  No wheezes,  rales, or rhonchi.  Normal respiratory effort. EXTREMITIES:  No lower extremity edema.  DISCHARGE INSTRUCTIONS:  Anticipate the patient will be discharged home on a heart-healthy diet with activity as tolerated.  Follow up with Dr. Arline Asp in about 1-2 weeks, continue a chemotherapy regimen as per Dr. Arline Asp, and follow up with Dr. Petra Kuba in 1 week.  Continue same medications: 1. With the addition of a nicotine transdermal patch 21 mg for 24     hours daily.  Discontinue smoking. 2. EMLA one application daily as needed for chemotherapy. 3. Norco 10/325, one half to 1 tablet p.o. every 4 hours as needed for     pain. 4. Oxycodone 5 mg 1 to 2 tablets every 6 hours as needed for pain. 5. Prochlorperazine 10 mg p.o. every 6 hours as needed for nausea.  Note that the above pain medications are his chronic medications.  No prescription given.  TIME COORDINATING DISCHARGE:  20 minutes.     Brendia Sacks, MD     DG/MEDQ  D:  11/23/2010  T:  11/23/2010  Job:  [id number]  cc:   [name], M.D. Fax: [phone]  [name], M.D. Fax: [phone]  Electronically Signed by Brendia Sacks  on 12/15/2010 09:05:25 PM

## 2010-12-17 ENCOUNTER — Other Ambulatory Visit (HOSPITAL_COMMUNITY): Payer: Self-pay | Admitting: Oncology

## 2010-12-17 ENCOUNTER — Encounter (HOSPITAL_BASED_OUTPATIENT_CLINIC_OR_DEPARTMENT_OTHER): Payer: Medicaid Other | Admitting: Oncology

## 2010-12-17 DIAGNOSIS — C8193 Hodgkin lymphoma, unspecified, intra-abdominal lymph nodes: Secondary | ICD-10-CM

## 2010-12-17 DIAGNOSIS — C8589 Other specified types of non-Hodgkin lymphoma, extranodal and solid organ sites: Secondary | ICD-10-CM

## 2010-12-17 LAB — CBC WITH DIFFERENTIAL/PLATELET
Basophils Absolute: 0 10*3/uL (ref 0.0–0.1)
Eosinophils Absolute: 0.5 10*3/uL (ref 0.0–0.5)
HGB: 9.9 g/dL — ABNORMAL LOW (ref 13.0–17.1)
LYMPH%: 14.1 % (ref 14.0–49.0)
MCH: 30.4 pg (ref 27.2–33.4)
MCV: 92.2 fL (ref 79.3–98.0)
MONO%: 10.4 % (ref 0.0–14.0)
NEUT#: 4.4 10*3/uL (ref 1.5–6.5)
NEUT%: 67.8 % (ref 39.0–75.0)
Platelets: 276 10*3/uL (ref 140–400)

## 2010-12-17 LAB — COMPREHENSIVE METABOLIC PANEL
ALT: <8 U/L (ref 0–53)
Albumin: 3.7 g/dL (ref 3.5–5.2)
CO2: 22 mEq/L (ref 19–32)
Calcium: 8.6 mg/dL (ref 8.4–10.5)
Chloride: 107 mEq/L (ref 96–112)
Creatinine, Ser: 0.65 mg/dL (ref 0.40–1.50)
Potassium: 3.3 mEq/L — ABNORMAL LOW (ref 3.5–5.3)

## 2010-12-17 LAB — LACTATE DEHYDROGENASE: LDH: 130 U/L (ref 94–250)

## 2010-12-20 ENCOUNTER — Encounter (HOSPITAL_BASED_OUTPATIENT_CLINIC_OR_DEPARTMENT_OTHER): Payer: Medicaid Other | Admitting: Oncology

## 2010-12-20 DIAGNOSIS — C8193 Hodgkin lymphoma, unspecified, intra-abdominal lymph nodes: Secondary | ICD-10-CM

## 2010-12-20 DIAGNOSIS — Z5111 Encounter for antineoplastic chemotherapy: Secondary | ICD-10-CM

## 2010-12-21 ENCOUNTER — Encounter (HOSPITAL_BASED_OUTPATIENT_CLINIC_OR_DEPARTMENT_OTHER): Payer: Medicaid Other | Admitting: Oncology

## 2010-12-21 DIAGNOSIS — Z5112 Encounter for antineoplastic immunotherapy: Secondary | ICD-10-CM

## 2010-12-21 DIAGNOSIS — C8193 Hodgkin lymphoma, unspecified, intra-abdominal lymph nodes: Secondary | ICD-10-CM

## 2010-12-21 DIAGNOSIS — Z5111 Encounter for antineoplastic chemotherapy: Secondary | ICD-10-CM

## 2010-12-22 ENCOUNTER — Encounter (HOSPITAL_BASED_OUTPATIENT_CLINIC_OR_DEPARTMENT_OTHER): Payer: Medicaid Other | Admitting: Oncology

## 2010-12-22 DIAGNOSIS — C8193 Hodgkin lymphoma, unspecified, intra-abdominal lymph nodes: Secondary | ICD-10-CM

## 2010-12-22 DIAGNOSIS — Z5189 Encounter for other specified aftercare: Secondary | ICD-10-CM

## 2010-12-27 ENCOUNTER — Other Ambulatory Visit (HOSPITAL_COMMUNITY): Payer: Self-pay | Admitting: Oncology

## 2010-12-27 ENCOUNTER — Encounter (HOSPITAL_BASED_OUTPATIENT_CLINIC_OR_DEPARTMENT_OTHER): Payer: Medicaid Other | Admitting: Oncology

## 2010-12-27 DIAGNOSIS — Z5111 Encounter for antineoplastic chemotherapy: Secondary | ICD-10-CM

## 2010-12-27 DIAGNOSIS — Z5189 Encounter for other specified aftercare: Secondary | ICD-10-CM

## 2010-12-27 DIAGNOSIS — C8193 Hodgkin lymphoma, unspecified, intra-abdominal lymph nodes: Secondary | ICD-10-CM

## 2010-12-27 DIAGNOSIS — Z5112 Encounter for antineoplastic immunotherapy: Secondary | ICD-10-CM

## 2010-12-27 LAB — CBC WITH DIFFERENTIAL/PLATELET
BASO%: 0.4 % (ref 0.0–2.0)
EOS%: 2 % (ref 0.0–7.0)
LYMPH%: 3.6 % — ABNORMAL LOW (ref 14.0–49.0)
MCH: 29.5 pg (ref 27.2–33.4)
MCHC: 32.8 g/dL (ref 32.0–36.0)
MCV: 89.9 fL (ref 79.3–98.0)
MONO#: 1.7 10*3/uL — ABNORMAL HIGH (ref 0.1–0.9)
MONO%: 8 % (ref 0.0–14.0)
NEUT%: 86 % — ABNORMAL HIGH (ref 39.0–75.0)
Platelets: 223 10*3/uL (ref 140–400)
RBC: 3.66 10*6/uL — ABNORMAL LOW (ref 4.20–5.82)
WBC: 21.3 10*3/uL — ABNORMAL HIGH (ref 4.0–10.3)
nRBC: 0 % (ref 0–0)

## 2010-12-28 NOTE — Consult Note (Signed)
VASCULAR SURGERY CONSULTATION   Keith, Bell  DOB:  [birth year]                                       08/28/2008  JYNWG#:[id number]   REFERRING PHYSICIAN:  [name], M.D.   PRIMARY CARE PHYSICIAN:  [name], M.D.   REFERRAL DIAGNOSIS:  Right lower extremity claudication.   HISTORY:  The patient is a 60 year old gentleman with a history of  Hodgkin's lymphoma.  He has had a long history of intermittent  discomfort in his right leg with ambulation.  This was relieved readily  by rest.  Occurs after approximately one block of walking.  Increased or  short in distance by brisk walking up hills.  Relieved readily by rest.  He notes numbness in his toes associated with this.   Risk factors for peripheral vascular disease include hyperlipidemia,  hypertension, tobacco abuse and positive family history of  atherosclerosis.   Lower extremity arterial Dopplers reveal an ABI of 0.68 in the right leg  with monophasic tibial waveforms.  Left ABI 1.0.   PAST MEDICAL HISTORY:  1. Stage IVB Hodgkin's lymphoma.  2. Hypertension.  3. Hyperlipidemia.   MEDICATIONS:  Verapamil 1 tablet daily.   ALLERGIES:  Aspirin.   FAMILY HISTORY:  Father living age 30 with heart disease.  Mother living  age 58 with a history of cancer.  Two brothers deceased.  Five sisters  living and generally well.   SOCIAL HISTORY:  The patient is single.  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  Does have alcohol on weekends.   REVIEW OF SYSTEMS:  The patient notes some shortness of breath with  exertion.  Right leg pain with ambulation.  Occasional dizziness and  muscle pain.   PHYSICAL EXAM:  General:  A 60 year old [place] American male.  Alert  and oriented and in no distress.  Vital signs:  BP 164/100, pulse 78 per  minute.  HEENT:  Mouth and throat are clear.  Normocephalic.  No  adenopathy.  Neck:  Supple.  No thyromegaly or adenopathy.   Chest:  Equal entry bilaterally with normal breath sounds.  No rales or rhonchi.  Cardiovascular:  No carotid bruits.  Normal heart sounds without  murmurs.  Regular rate and rhythm.  Abdomen:  Soft, nontender.  No  masses or organomegaly.  Normal bowel sounds.  Lower extremities:  2+  femoral pulses bilaterally.  Intact left popliteal, posterior tibial and  dorsalis pedis pulses.  Absent right popliteal, posterior tibial and  dorsalis pedis pulses.  No ischemic skin changes.  Neurological:  Cranial nerves intact.  Strength equal bilaterally.  Normal reflexes.  Skin:  Intact without rash or ulceration.   IMPRESSION:  1. Right lower extremity claudication.  2. Hypertension.  3. Hyperlipidemia.  4. Stage IV Hodgkin's lymphoma.   PLAN:  The patient does have right lower extremity claudication  associated with probable right superficial femoral occlusion.  Scheduled  for a right lower extremity arteriogram and possible percutaneous  intervention if appropriate lesion identified.   Balinda Quails, M.D.  Electronically Signed  PGH/MEDQ  D:  09/04/2008  T:  09/05/2008  Job:  [id number]   cc:   [name], M.D.  [name], M.D.

## 2010-12-28 NOTE — Discharge Summary (Signed)
Keith Bell, DIODATO NO.:  [id number]   MEDICAL RECORD NO.:  [id number]          PATIENT TYPE:  INP   LOCATION:  1304                         FACILITY:  [facility]   PHYSICIAN:  Rose Phi. Myna Hidalgo, M.D. DATE OF BIRTH:  1951/03/01   DATE OF ADMISSION:  02/12/2009  DATE OF DISCHARGE:  02/15/2009                               DISCHARGE SUMMARY   DIAGNOSES UPON DISCHARGE:  1. Febrile neutropenia secondary to chemotherapy.  2. Recurrent Hodgkin's disease.  3. 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  4. Peripheral vascular disease.   CONDITION UPON DISCHARGE:  Stable.   ACTIVITY:  As tolerated.   DIET:  No restrictions.   FOLLOW-UP:  The patient will follow up with Dr. Arline Asp in 1 week.   MEDICATION UPON DISCHARGE:  Levaquin 500 mg p.o. daily times 4 days.   HOSPITAL COURSE:  [facility]. Levene [facility]  He has chemotherapy for recurrent Hodgkin's  disease.  He is getting RICE.  He subsequent presented with a white cell  count of 0.9 with an ANC of 10.  Platelet count was 0.5.  He had a  temperature.   Dr. Arline Asp admitted him.  He had blood cultures done.  Blood cultures  were all negative.  He had a little bit of a productive cough.  Sputum  cultures were taken, these were all negative.   He is on antibiotics with I think Zithromax and Maxipime.  He did  receive Neulasta as an outpatient.  As such, no Neupogen was given.   He had a chest x-ray done on admission.  Chest was done which showed no  active process.  There was no infiltrates.  He had some hyperinflation  secondary to his COPD.   He received IV fluids.  He had a pretty decent appetite.  There was no  nausea or vomiting.   His blood counts improved nicely.  He remained afebrile during his  hospital stay.  All of his vital signs looked good.  His blood pressure  remained stable.   He was put on some oral potassium because  of potassium 3.6 on admission.   On July 4th, he was feeling great.  His cough was improving.  He had no  temperature.  He was eating well.  He was ambulating.   We stopped the Maxipime the day before.  He was on oral Zithromax.   His blood work on the 4th showed a white count of 5.8, hemoglobin 8.2,  hematocrit 24.3, platelet count 69,000.   He had no bleeding.   The patient felt that he was able to go home.  I felt that he was able  to go home also.   We subsequently plan to discharge him.   Upon discharge, his vital signs show a temperature of 98.2, pulse 91,  respiratory rate 20, blood pressure 107/74, oxygen saturation on room  air was 94%.  HEAD AND NECK:  Shows a normocephalic, atraumatic skull.  There are no  ocular or oral lesions.  He had no  oral thrush.  NECK:  Supple with no adenopathy.  LUNGS:  Clear bilaterally.  He had some scattered crackles which  appeared to be dry.  He had no wheezing.  CARDIAC:  Regular rate and rhythm with a normal S1-S2.  There were no  murmurs, rubs or bruits.  ABDOMINAL:  Soft with good bowel sounds.  There is no palpable abdominal  mass.  No palpable hepatosplenomegaly.  BACK:  No tenderness of the spine, ribs or hips.  EXTREMITIES:  Shows no clubbing, cyanosis or edema.  NEUROLOGICAL:  No focal neurological deficits.      Rose Phi. Myna Hidalgo, M.D.  Electronically Signed     PRE/MEDQ  D:  02/15/2009  T:  02/15/2009  Job:  [id number]

## 2010-12-28 NOTE — Op Note (Signed)
NAME:  Keith Bell, Keith Bell NO.:  [id number]   MEDICAL RECORD NO.:  [id number]          PATIENT TYPE:  AMB   LOCATION:  SDS                          FACILITY:  [facility]   PHYSICIAN:  Balinda Quails, M.D.    DATE OF BIRTH:  1951-05-19   DATE OF PROCEDURE:  10/29/2008  DATE OF DISCHARGE:  10/29/2008                               OPERATIVE REPORT   PHYSICIAN:  Balinda Quails, MD   DIAGNOSIS:  Right lower extremity claudication.   PROCEDURES:  Abdominal aortogram with bilateral lower extremity runoff,  arteriography.   CONTRAST:  A 97 mL of Visipaque.   ACCESS:  Left common femoral artery 5-French sheath.   COMPLICATIONS:  None apparent.   CLINICAL NOTE:  [name]. Keating is a 60 year old gentleman with a  history of Hodgkin lymphoma.  He has developed worsening right lower  extremity claudication.  This is relieved readily by rest.  Walks  approximately 1 block with onset of pain.   Brought to the Cath Lab at this time for further workup with  arteriography and possible intervention.   PROCEDURE NOTE:  The patient brought to the Cath Lab in stable  condition.  Placed in supine position.  Both groins prepped and draped  in a sterile fashion.  Left groin instilled with 1% Xylocaine with  epinephrine.  An 18-gauge needle introduced into left common femoral  artery.  A 0.035 Wholey guidewire advanced through the needle and into  the mid abdominal aorta.  A 5-French sheath advanced over the guidewire.  Pigtail catheter advanced over guidewire to the juxtarenal aorta.  Standard AP abdominal aortogram obtained.  Mild right renal artery  stenosis noted.  Widely patent left renal artery.  Normal infrarenal  aorta.   The iliac vessels revealed mild-to-moderate plaque without significant  stenosis.  Hypogastric vessels patent bilaterally.  The external iliac  arteries intact.   Pigtail catheter brought down the aortic bifurcation and lower extremity  arteriogram  obtained.  The left lower extremity revealed patent common  femoral and profunda femoris arteries.  Left superficial femoral artery  revealed mild-to-moderate disease with a moderate stenosis at the  adductor canal.  The popliteal artery was patent.  Single-vessel  posterior tibial runoff.   Right lower extremity revealed a diseased common femoral artery, which  was small in caliber.  The profunda femoris artery intact.  The proximal  right superficial femoral artery was severely diseased but small in  caliber.  There was a high-grade stenosis of the midthigh level of the  right superficial femoral artery.  There was an occlusion of the right  superficial femoral artery at the adductor canal and reconstitution of  the popliteal artery at the level of the knee joint.  A 1-vessel  posterior tibial runoff present in the right calf.   This completed the arteriogram procedure.  The patient tolerated the  procedure well.  No apparent complications.  Guidewire reinserted.  Pigtail catheter removed.  Left femoral sheath removed.   IMPRESSION:  1. Patent bilateral renal arteries without significant stenosis.  2. 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  3. Mild-to-moderate left superficial femoral artery disease.  4. Right superficial femoral artery occlusion.  5. Single vessel tibial runoff, posterior tibial artery bilaterally.   DISPOSITION:  The patient will be discharged from hospital today.  Arrangements were made for the office to discuss further treatment,  medical management versus operative femoral-popliteal bypass.      Balinda Quails, M.D.  Electronically Signed     PGH/MEDQ  D:  10/29/2008  T:  10/30/2008  Job:  [id number]   cc:   [name], M.D.  [name], M.D.

## 2010-12-28 NOTE — H&P (Signed)
NAMERAYYAN, BURLEY NO.:  [id number]   MEDICAL RECORD NO.:  [id number]          PATIENT TYPE:  INP   LOCATION:  1304                         FACILITY:  [facility]   PHYSICIAN:  [name], M.D.DATE OF BIRTH:  Jul 02, 1951   DATE OF ADMISSION:  02/12/2009  DATE OF DISCHARGE:                              HISTORY & PHYSICAL   HISTORY:  Keith Bell is a 60 year old African American male  undergoing chemotherapy for Hodgkin's lymphoma, who is admitted to the  hospital after being seen briefly at the [facility] for  shortness of breath, which developed this morning, some productive cough  with some green sputum, and fever of 100.8, associated with some shaking  chills last evening in the setting of severe chemotherapy-induced  neutropenia.  Obdulio's history dates back to the onset of his Hodgkin's  disease, which was in March 2009.  The patient presented at that time  with pain in his low back, a 10-pound weight loss, generally poor  energy, drenching night sweats.  CT scan ordered by Dr. Petra Kuba in  mid March 2009 showed massive retroperitoneal lymphadenopathy.  A core  needle biopsy of the retroperitoneal mass on November 13, 2007 disclosed  Hodgkin's lymphoma.  The patient had splenomegaly.  He was felt to have  stage IVB disease on the basis of a positive bone marrow that was  carried out on November 21, 2007.  He was treated with 4-1/2 cycles of  chemotherapy, a modified ABVD program, specifically Adriamycin, Velban,  DTIC, and Decadron.  Bleomycin was omitted because of the patient's  underlying chronic obstructive pulmonary disease.  He was found to have  progressive disease on that treatment program.  He was referred to [facility]  where he saw Dr. Elta Guadeloupe.  It was recommended that he be treated  with Rituxan.  It was recommended that he be treated with Rituxan COPP  program.  He received 6 cycles from September 2009 to February 2010 and  seemed to  have a partial response that was rather transient, and then  ultimately had evidence of progressive disease on the basis of a PET  scan that was carried out on October 30, 2008.  The patient has been  receiving R-ICE, specifically Rituxan, ifosfamide, mesna, vp16, and  carboplatin since May 3.  He has had problems with hypotension,  shortness of breath.  He has not required any admissions to the  hospital, but he has required IV fluids in the past.  He has undergone a  cardiac workup, which yielded 50-55% left ventricular ejection fraction  on a 2D echocardiogram carried out on May 11.  The patient has been  receiving Neulasta with his chemotherapy.  He has not required any blood  transfusions or admissions.  His last course of chemotherapy was delayed  1 week because of some transient thrombocytopenia.  On June 14 he  received Rituxan only rather than the full course of chemotherapy.  Cycle #3 was started on June 21 consisting of Rituxan 750 mg with  ifosfamide and mesna 2400 mg each, vp16 170 mg daily for  3 days,  ifosfamide and mesna for 3 days, and then carboplatin 700 mg on June 23.  Neulasta was given on June 25, 6 mg subcutaneous.  CBC on Monday the  28th was as follows.  White count 8.2, ANC 6.8, hemoglobin 9.8,  hematocrit 28.9, platelets 175,000.  The patient had been sick all  weekend and we brought him in and gave him 1L of IV fluids.  He was also  given Zofran and Decadron.  He seemed to be improving until symptoms of  the last 24 hours.  The patient seen in the office.  His O2 saturation  was 93%, whereas normally it runs 98-99%.  The patient was felt to look  sick.  He had a tachycardia and it was decided that we would admit him.  He also went to radiology and had a chest x-ray which is interpreted as  not showing any acute disease or infiltrates.  Also contributing to the  need for admission is the fact that the patient's ANC is 0.1 with a  white count of 0.9, hemoglobin  10.2, hematocrit 28.6, and platelets  105,000 in the setting of fever.   PAST MEDICAL HISTORY:  Notable for a 5-year history of hypertension and  dyslipidemia.  The patient also has peripheral vascular disease, has had  an arteriogram that shows obstruction of the arteries into his right  leg.  He has been having intermittent claudication of his right calf.  He has had back and leg pain, felt to be due to degenerative disk  disease.  He has underlying COPD with a low diffusion capacity of carbon  monoxide.  The patient denies any surgery.  No true allergies to any  medicines, although ASPIRIN does irritate his stomach.  His only  medicines have been baclofen as needed for hiccups, Compazine as needed  for nausea and vomiting.  The patient apparently was taking an  antibiotic the last few days, but he does not know the name of it.   FAMILY HISTORY:  Mother and father are living.  Mother is in her 17s and  has heart disease.  Father is in his mid 28s and has metastatic prostate  cancer.  The patient has five sisters, two have been treated  successfully for breast cancer.  He has 2 brothers who are deceased, one  in [place], another an unknown cause at age 10.  The patient has seven  children ages 18-37, seven grandchildren, all of whom are in good  health.   SOCIAL HISTORY:  The patient has been a heavy cigarette smoker since age  7, up to 2 packs of cigarettes a day.  I believe he is still smoking.  He has also been a heavy drinker of alcohol in the past, about 35 years,  usually whiskey, usually 2 to 3 on week days and about a pint a day on  weekends.  I do not believe he has been drinking over the past year.  The patient was born in [place], raised on a farm, has ninth grade  education.  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  He is engaged and has been living with his fiance, Keith Bell, in a mobile  home.  He does not have health insurance.  He does drive.   REVIEW  OF SYSTEMS:  The patient is generally feeling weak and light-  headed, especially when he stands.  Symptoms relating to current  admission as cited above.  The patient currently  has nausea and poor  appetite.  He has not been having any recent vomiting.  No diarrhea.   PHYSICAL EXAM:  CONSTITUTIONAL:  The patient looks mildly ill.  Not  acutely ill or septic.  He looks a little washed out.  VITAL SIGNS:  Temperature is 99.9, pulse is 122 and regular,  respirations regular and unlabored.  Blood pressure today 108/73.  O2  saturation on admission was 96% on room air.  Weight is recorded at 170  pounds, height 66 inches.  I think the weight is inaccurate since the  most recent weight we have on the patient is 160 pounds from June 21.  HEENT:  He has scalp alopecia.  No scleral icterus.  Conjunctivae  injected.  Mouth and pharynx benign with no thrush or ulcers.  LYMPHATICS:  There is no peripheral adenopathy palpable in the neck,  supraclavicular, axillary, or inguinal areas.  LUNGS:  Essentially clear to percussion and auscultation.  CARDIAC:  Regular rhythm with systolic ejection murmur.  ABDOMEN:  Somewhat firm and nontender with no organomegaly or masses  palpable.  EXTREMITIES:  No peripheral edema or clubbing.  NEUROLOGIC:  Grossly normal with no focal findings.  The patient is  alert and oriented.   LABORATORY DATA:  White count 0.9, ANC 0.1, hemoglobin 10.2, hematocrit  38.6, platelets 105,000.  Chemistries sodium 131, potassium 3.3,  chloride 100, CO2 21, glucose 120, BUN 10, creatinine 0.98, calcium 9.3.  Chest x-ray showed no acute disease.   IMPRESSION AND PLAN:  1. Febrile neutropenia, possible pulmonary infection.  The patient is      being admitted to the hospital.  He will have blood cultures x2,      sputum culture.  We are going to start him empirically on Maxipime      2 g IV every 12 hours.  Also Zithromax 500 mg daily.  The patient      received Neulasta on June  25.  Will hold off on any Neupogen.  2. Hodgkin's lymphoma, recurrent now on second salvage therapy with R-      ICE.  Today is day 11 of cycle #3.  Will check daily blood counts.      Ultimately, the plan was to treat him with 3 to 4 cycles of current      chemotherapy and then to repeat his PET scan.  If there is a      response, hopefully refer him for high dose therapy and stem cell      rescue at [facility].  3. Peripheral vascular disease with intermittent claudication in the      right lower extremity.  4. Chronic obstructive pulmonary disease.  5. Hypertension.  6. Dyslipidemia.  7. Weight loss.  The patient will be given nutritional supplements.  8. Code Status:  The patient is a Full Code.      [name], M.D.  Electronically Signed     DSM/MEDQ  D:  02/12/2009  T:  02/12/2009  Job:  [id number]   cc:   [name], M.D.  Fax: [phone]

## 2011-01-03 ENCOUNTER — Encounter (HOSPITAL_BASED_OUTPATIENT_CLINIC_OR_DEPARTMENT_OTHER): Payer: Medicaid Other | Admitting: Oncology

## 2011-01-03 ENCOUNTER — Other Ambulatory Visit (HOSPITAL_COMMUNITY): Payer: Self-pay | Admitting: Oncology

## 2011-01-03 DIAGNOSIS — Z5112 Encounter for antineoplastic immunotherapy: Secondary | ICD-10-CM

## 2011-01-03 DIAGNOSIS — C8193 Hodgkin lymphoma, unspecified, intra-abdominal lymph nodes: Secondary | ICD-10-CM

## 2011-01-03 DIAGNOSIS — Z5189 Encounter for other specified aftercare: Secondary | ICD-10-CM

## 2011-01-03 DIAGNOSIS — Z5111 Encounter for antineoplastic chemotherapy: Secondary | ICD-10-CM

## 2011-01-03 LAB — CBC WITH DIFFERENTIAL/PLATELET
BASO%: 0.6 % (ref 0.0–2.0)
LYMPH%: 18.6 % (ref 14.0–49.0)
MCHC: 32.2 g/dL (ref 32.0–36.0)
MONO#: 0.8 10*3/uL (ref 0.1–0.9)
RBC: 3.62 10*6/uL — ABNORMAL LOW (ref 4.20–5.82)
RDW: 21.4 % — ABNORMAL HIGH (ref 11.0–14.6)
WBC: 11.6 10*3/uL — ABNORMAL HIGH (ref 4.0–10.3)
lymph#: 2.2 10*3/uL (ref 0.9–3.3)
nRBC: 0 % (ref 0–0)

## 2011-01-14 ENCOUNTER — Encounter (HOSPITAL_BASED_OUTPATIENT_CLINIC_OR_DEPARTMENT_OTHER): Payer: Self-pay | Admitting: Oncology

## 2011-01-14 ENCOUNTER — Other Ambulatory Visit (HOSPITAL_COMMUNITY): Payer: Self-pay | Admitting: Oncology

## 2011-01-14 DIAGNOSIS — Z5111 Encounter for antineoplastic chemotherapy: Secondary | ICD-10-CM

## 2011-01-14 DIAGNOSIS — Z5112 Encounter for antineoplastic immunotherapy: Secondary | ICD-10-CM

## 2011-01-14 DIAGNOSIS — C8193 Hodgkin lymphoma, unspecified, intra-abdominal lymph nodes: Secondary | ICD-10-CM

## 2011-01-14 DIAGNOSIS — Z5189 Encounter for other specified aftercare: Secondary | ICD-10-CM

## 2011-01-14 LAB — URINALYSIS, MICROSCOPIC - CHCC
Bilirubin (Urine): NEGATIVE
Ketones: NEGATIVE mg/dL
pH: 6.5 (ref 4.6–8.0)

## 2011-01-14 LAB — COMPREHENSIVE METABOLIC PANEL
AST: 12 U/L (ref 0–37)
Albumin: 4.1 g/dL (ref 3.5–5.2)
BUN: 8 mg/dL (ref 6–23)
CO2: 24 mEq/L (ref 19–32)
Calcium: 9.5 mg/dL (ref 8.4–10.5)
Chloride: 106 mEq/L (ref 96–112)
Creatinine, Ser: 0.68 mg/dL (ref 0.50–1.35)
Glucose, Bld: 115 mg/dL — ABNORMAL HIGH (ref 70–99)
Potassium: 3.2 mEq/L — ABNORMAL LOW (ref 3.5–5.3)

## 2011-01-14 LAB — URIC ACID: Uric Acid, Serum: 4.4 mg/dL (ref 4.0–7.8)

## 2011-01-14 LAB — SEDIMENTATION RATE: Sed Rate: 28 mm/hr — ABNORMAL HIGH (ref 0–16)

## 2011-01-14 LAB — CBC WITH DIFFERENTIAL/PLATELET
Basophils Absolute: 0 10*3/uL (ref 0.0–0.1)
EOS%: 3.8 % (ref 0.0–7.0)
HGB: 12.3 g/dL — ABNORMAL LOW (ref 13.0–17.1)
MCH: 30.6 pg (ref 27.2–33.4)
NEUT#: 3.6 10*3/uL (ref 1.5–6.5)
RDW: 21.4 % — ABNORMAL HIGH (ref 11.0–14.6)
WBC: 6.8 10*3/uL (ref 4.0–10.3)
lymph#: 2.3 10*3/uL (ref 0.9–3.3)

## 2011-01-14 LAB — LACTATE DEHYDROGENASE: LDH: 172 U/L (ref 94–250)

## 2011-01-17 ENCOUNTER — Encounter (HOSPITAL_BASED_OUTPATIENT_CLINIC_OR_DEPARTMENT_OTHER): Payer: Self-pay | Admitting: Oncology

## 2011-01-17 DIAGNOSIS — C8193 Hodgkin lymphoma, unspecified, intra-abdominal lymph nodes: Secondary | ICD-10-CM

## 2011-01-17 DIAGNOSIS — Z5111 Encounter for antineoplastic chemotherapy: Secondary | ICD-10-CM

## 2011-01-17 DIAGNOSIS — R3915 Urgency of urination: Secondary | ICD-10-CM

## 2011-01-17 DIAGNOSIS — Z5112 Encounter for antineoplastic immunotherapy: Secondary | ICD-10-CM

## 2011-01-17 LAB — URINE CULTURE

## 2011-01-18 ENCOUNTER — Encounter (HOSPITAL_BASED_OUTPATIENT_CLINIC_OR_DEPARTMENT_OTHER): Payer: Self-pay | Admitting: Oncology

## 2011-01-18 DIAGNOSIS — Z5111 Encounter for antineoplastic chemotherapy: Secondary | ICD-10-CM

## 2011-01-18 DIAGNOSIS — C8193 Hodgkin lymphoma, unspecified, intra-abdominal lymph nodes: Secondary | ICD-10-CM

## 2011-01-19 ENCOUNTER — Encounter (HOSPITAL_BASED_OUTPATIENT_CLINIC_OR_DEPARTMENT_OTHER): Payer: Self-pay | Admitting: Oncology

## 2011-01-19 DIAGNOSIS — C8193 Hodgkin lymphoma, unspecified, intra-abdominal lymph nodes: Secondary | ICD-10-CM

## 2011-01-24 ENCOUNTER — Other Ambulatory Visit (HOSPITAL_COMMUNITY): Payer: Self-pay | Admitting: Oncology

## 2011-01-24 ENCOUNTER — Encounter (HOSPITAL_BASED_OUTPATIENT_CLINIC_OR_DEPARTMENT_OTHER): Payer: Self-pay | Admitting: Oncology

## 2011-01-24 DIAGNOSIS — C8193 Hodgkin lymphoma, unspecified, intra-abdominal lymph nodes: Secondary | ICD-10-CM

## 2011-01-24 LAB — CBC WITH DIFFERENTIAL/PLATELET
Basophils Absolute: 0.1 10*3/uL (ref 0.0–0.1)
EOS%: 2.7 % (ref 0.0–7.0)
HGB: 12.3 g/dL — ABNORMAL LOW (ref 13.0–17.1)
LYMPH%: 7.6 % — ABNORMAL LOW (ref 14.0–49.0)
MCH: 30.3 pg (ref 27.2–33.4)
MCV: 91.4 fL (ref 79.3–98.0)
MONO%: 11.4 % (ref 0.0–14.0)
RDW: 20.6 % — ABNORMAL HIGH (ref 11.0–14.6)

## 2011-02-10 ENCOUNTER — Emergency Department (HOSPITAL_COMMUNITY)
Admission: EM | Admit: 2011-02-10 | Discharge: 2011-02-10 | Disposition: A | Discharge status: Home / Self Care | Payer: Self-pay | Attending: Emergency Medicine | Admitting: Emergency Medicine

## 2011-02-10 DIAGNOSIS — M25519 Pain in unspecified shoulder: Secondary | ICD-10-CM | POA: Insufficient documentation

## 2011-02-10 DIAGNOSIS — C8190 Hodgkin lymphoma, unspecified, unspecified site: Secondary | ICD-10-CM | POA: Insufficient documentation

## 2011-02-10 DIAGNOSIS — I10 Essential (primary) hypertension: Secondary | ICD-10-CM | POA: Insufficient documentation

## 2011-02-10 DIAGNOSIS — Z85028 Personal history of other malignant neoplasm of stomach: Secondary | ICD-10-CM | POA: Insufficient documentation

## 2011-02-10 DIAGNOSIS — Z79899 Other long term (current) drug therapy: Secondary | ICD-10-CM | POA: Insufficient documentation

## 2011-02-10 DIAGNOSIS — IMO0001 Reserved for inherently not codable concepts without codable children: Secondary | ICD-10-CM | POA: Insufficient documentation

## 2011-02-10 DIAGNOSIS — M542 Cervicalgia: Secondary | ICD-10-CM | POA: Insufficient documentation

## 2011-02-10 LAB — DIFFERENTIAL
Basophils Absolute: 0.1 10*3/uL (ref 0.0–0.1)
Eosinophils Absolute: 0.3 10*3/uL (ref 0.0–0.7)
Lymphocytes Relative: 14 % (ref 12–46)
Lymphs Abs: 0.9 10*3/uL (ref 0.7–4.0)
Monocytes Absolute: 0.7 10*3/uL (ref 0.1–1.0)
Neutro Abs: 4.2 10*3/uL (ref 1.7–7.7)

## 2011-02-10 LAB — POCT I-STAT, CHEM 8
Calcium, Ion: 1.18 mmol/L (ref 1.12–1.32)
Creatinine, Ser: 0.9 mg/dL (ref 0.50–1.35)
Glucose, Bld: 94 mg/dL (ref 70–99)
Hemoglobin: 14.3 g/dL (ref 13.0–17.0)
Sodium: 140 mEq/L (ref 135–145)
TCO2: 26 mmol/L (ref 0–100)

## 2011-02-10 LAB — CBC
MCH: 30.9 pg (ref 26.0–34.0)
MCHC: 33.5 g/dL (ref 30.0–36.0)
MCV: 92.1 fL (ref 78.0–100.0)
Platelets: 212 10*3/uL (ref 150–400)

## 2011-02-14 ENCOUNTER — Other Ambulatory Visit (HOSPITAL_COMMUNITY): Payer: Self-pay | Admitting: Oncology

## 2011-02-14 ENCOUNTER — Encounter (HOSPITAL_BASED_OUTPATIENT_CLINIC_OR_DEPARTMENT_OTHER): Payer: Self-pay | Admitting: Oncology

## 2011-02-14 DIAGNOSIS — Z5111 Encounter for antineoplastic chemotherapy: Secondary | ICD-10-CM

## 2011-02-14 DIAGNOSIS — C8590 Non-Hodgkin lymphoma, unspecified, unspecified site: Secondary | ICD-10-CM

## 2011-02-14 DIAGNOSIS — C8193 Hodgkin lymphoma, unspecified, intra-abdominal lymph nodes: Secondary | ICD-10-CM

## 2011-02-14 LAB — CBC WITH DIFFERENTIAL/PLATELET
BASO%: 1.2 % (ref 0.0–2.0)
Eosinophils Absolute: 0.5 10*3/uL (ref 0.0–0.5)
HCT: 40.5 % (ref 38.4–49.9)
MCHC: 33.1 g/dL (ref 32.0–36.0)
MONO#: 0.7 10*3/uL (ref 0.1–0.9)
NEUT#: 3.2 10*3/uL (ref 1.5–6.5)
NEUT%: 61.8 % (ref 39.0–75.0)
RBC: 4.35 10*6/uL (ref 4.20–5.82)
WBC: 5.2 10*3/uL (ref 4.0–10.3)
lymph#: 0.7 10*3/uL — ABNORMAL LOW (ref 0.9–3.3)

## 2011-02-14 LAB — COMPREHENSIVE METABOLIC PANEL
ALT: <8 U/L (ref 0–53)
CO2: 22 mEq/L (ref 19–32)
Calcium: 8.8 mg/dL (ref 8.4–10.5)
Chloride: 109 mEq/L (ref 96–112)
Glucose, Bld: 125 mg/dL — ABNORMAL HIGH (ref 70–99)
Sodium: 139 mEq/L (ref 135–145)
Total Protein: 5.6 g/dL — ABNORMAL LOW (ref 6.0–8.3)

## 2011-02-14 LAB — LACTATE DEHYDROGENASE: LDH: 143 U/L (ref 94–250)

## 2011-02-15 ENCOUNTER — Encounter (HOSPITAL_BASED_OUTPATIENT_CLINIC_OR_DEPARTMENT_OTHER): Payer: Self-pay | Admitting: Oncology

## 2011-02-15 DIAGNOSIS — Z5111 Encounter for antineoplastic chemotherapy: Secondary | ICD-10-CM

## 2011-02-15 DIAGNOSIS — C8193 Hodgkin lymphoma, unspecified, intra-abdominal lymph nodes: Secondary | ICD-10-CM

## 2011-02-16 ENCOUNTER — Encounter (HOSPITAL_BASED_OUTPATIENT_CLINIC_OR_DEPARTMENT_OTHER): Payer: Self-pay | Admitting: Oncology

## 2011-02-16 DIAGNOSIS — D702 Other drug-induced agranulocytosis: Secondary | ICD-10-CM

## 2011-02-16 DIAGNOSIS — C8193 Hodgkin lymphoma, unspecified, intra-abdominal lymph nodes: Secondary | ICD-10-CM

## 2011-02-16 DIAGNOSIS — Z5111 Encounter for antineoplastic chemotherapy: Secondary | ICD-10-CM

## 2011-02-21 ENCOUNTER — Other Ambulatory Visit (HOSPITAL_COMMUNITY): Payer: Self-pay | Admitting: Oncology

## 2011-02-21 ENCOUNTER — Encounter (HOSPITAL_BASED_OUTPATIENT_CLINIC_OR_DEPARTMENT_OTHER): Payer: Self-pay | Admitting: Oncology

## 2011-02-21 DIAGNOSIS — C8193 Hodgkin lymphoma, unspecified, intra-abdominal lymph nodes: Secondary | ICD-10-CM

## 2011-02-21 LAB — CBC WITH DIFFERENTIAL/PLATELET
BASO%: 0 % (ref 0.0–2.0)
EOS%: 4.3 % (ref 0.0–7.0)
MCH: 31.4 pg (ref 27.2–33.4)
MCHC: 33.1 g/dL (ref 32.0–36.0)
MCV: 94.8 fL (ref 79.3–98.0)
MONO%: 8.4 % (ref 0.0–14.0)
RBC: 4.02 10*6/uL — ABNORMAL LOW (ref 4.20–5.82)
RDW: 19.9 % — ABNORMAL HIGH (ref 11.0–14.6)

## 2011-02-28 ENCOUNTER — Encounter: Payer: Self-pay | Admitting: Oncology

## 2011-02-28 ENCOUNTER — Other Ambulatory Visit (HOSPITAL_COMMUNITY): Payer: Self-pay | Admitting: Oncology

## 2011-02-28 DIAGNOSIS — R3915 Urgency of urination: Secondary | ICD-10-CM

## 2011-02-28 LAB — CBC WITH DIFFERENTIAL/PLATELET
EOS%: 3.4 % (ref 0.0–7.0)
Eosinophils Absolute: 0.4 10*3/uL (ref 0.0–0.5)
MCV: 90.5 fL (ref 79.3–98.0)
MONO%: 6.7 % (ref 0.0–14.0)
NEUT#: 10.1 10*3/uL — ABNORMAL HIGH (ref 1.5–6.5)
RBC: 4.2 10*6/uL (ref 4.20–5.82)
RDW: 18.6 % — ABNORMAL HIGH (ref 11.0–14.6)
lymph#: 1.2 10*3/uL (ref 0.9–3.3)
nRBC: 0 % (ref 0–0)

## 2011-03-11 ENCOUNTER — Encounter (HOSPITAL_COMMUNITY)
Admission: RE | Admit: 2011-03-11 | Discharge: 2011-03-11 | Disposition: A | Discharge status: Home / Self Care | Payer: Medicaid Other | Source: Ambulatory Visit | Attending: Oncology | Admitting: Oncology

## 2011-03-11 DIAGNOSIS — C8190 Hodgkin lymphoma, unspecified, unspecified site: Secondary | ICD-10-CM | POA: Insufficient documentation

## 2011-03-11 DIAGNOSIS — C8590 Non-Hodgkin lymphoma, unspecified, unspecified site: Secondary | ICD-10-CM

## 2011-03-11 MED ORDER — FLUDEOXYGLUCOSE F - 18 (FDG) INJECTION
17.0000 | Freq: Once | INTRAVENOUS | Status: AC | PRN
  Administered 2011-03-11: 17 via INTRAVENOUS

## 2011-03-14 ENCOUNTER — Other Ambulatory Visit (HOSPITAL_COMMUNITY): Payer: Self-pay | Admitting: Oncology

## 2011-03-14 ENCOUNTER — Encounter (HOSPITAL_BASED_OUTPATIENT_CLINIC_OR_DEPARTMENT_OTHER): Payer: Self-pay | Admitting: Oncology

## 2011-03-14 DIAGNOSIS — Z5112 Encounter for antineoplastic immunotherapy: Secondary | ICD-10-CM

## 2011-03-14 DIAGNOSIS — C8193 Hodgkin lymphoma, unspecified, intra-abdominal lymph nodes: Secondary | ICD-10-CM

## 2011-03-14 DIAGNOSIS — Z5111 Encounter for antineoplastic chemotherapy: Secondary | ICD-10-CM

## 2011-03-14 LAB — CBC WITH DIFFERENTIAL/PLATELET
BASO%: 1.7 % (ref 0.0–2.0)
HCT: 38.1 % — ABNORMAL LOW (ref 38.4–49.9)
MCHC: 33.9 g/dL (ref 32.0–36.0)
MONO#: 0.7 10*3/uL (ref 0.1–0.9)
RBC: 4.31 10*6/uL (ref 4.20–5.82)
WBC: 5.8 10*3/uL (ref 4.0–10.3)
lymph#: 1.8 10*3/uL (ref 0.9–3.3)
nRBC: 0 % (ref 0–0)

## 2011-03-14 LAB — LACTATE DEHYDROGENASE: LDH: 184 U/L (ref 94–250)

## 2011-03-14 LAB — COMPREHENSIVE METABOLIC PANEL
ALT: <8 U/L (ref 0–53)
AST: 15 U/L (ref 0–37)
Creatinine, Ser: 0.72 mg/dL (ref 0.50–1.35)
Total Bilirubin: 0.8 mg/dL (ref 0.3–1.2)

## 2011-03-15 ENCOUNTER — Encounter (HOSPITAL_BASED_OUTPATIENT_CLINIC_OR_DEPARTMENT_OTHER): Payer: Self-pay | Admitting: Oncology

## 2011-03-15 DIAGNOSIS — Z5111 Encounter for antineoplastic chemotherapy: Secondary | ICD-10-CM

## 2011-03-15 DIAGNOSIS — C8193 Hodgkin lymphoma, unspecified, intra-abdominal lymph nodes: Secondary | ICD-10-CM

## 2011-03-16 ENCOUNTER — Encounter (HOSPITAL_BASED_OUTPATIENT_CLINIC_OR_DEPARTMENT_OTHER): Payer: Self-pay | Admitting: Oncology

## 2011-03-16 DIAGNOSIS — C8193 Hodgkin lymphoma, unspecified, intra-abdominal lymph nodes: Secondary | ICD-10-CM

## 2011-03-21 ENCOUNTER — Encounter (HOSPITAL_BASED_OUTPATIENT_CLINIC_OR_DEPARTMENT_OTHER): Payer: Self-pay | Admitting: Oncology

## 2011-03-21 ENCOUNTER — Other Ambulatory Visit (HOSPITAL_COMMUNITY): Payer: Self-pay | Admitting: Oncology

## 2011-03-21 DIAGNOSIS — C8193 Hodgkin lymphoma, unspecified, intra-abdominal lymph nodes: Secondary | ICD-10-CM

## 2011-03-21 DIAGNOSIS — Z5111 Encounter for antineoplastic chemotherapy: Secondary | ICD-10-CM

## 2011-03-21 LAB — CBC WITH DIFFERENTIAL/PLATELET
Basophils Absolute: 0 10*3/uL (ref 0.0–0.1)
Eosinophils Absolute: 0.5 10*3/uL (ref 0.0–0.5)
HGB: 11.8 g/dL — ABNORMAL LOW (ref 13.0–17.1)
MCV: 91 fL (ref 79.3–98.0)
MONO%: 12.6 % (ref 0.0–14.0)
NEUT#: 9.1 10*3/uL — ABNORMAL HIGH (ref 1.5–6.5)
Platelets: 194 10*3/uL (ref 140–400)
RDW: 20 % — ABNORMAL HIGH (ref 11.0–14.6)

## 2011-03-24 ENCOUNTER — Ambulatory Visit (HOSPITAL_COMMUNITY)
Admission: RE | Admit: 2011-03-24 | Discharge: 2011-03-24 | Disposition: A | Discharge status: Home / Self Care | Payer: Medicaid Other | Source: Ambulatory Visit | Attending: Oncology | Admitting: Oncology

## 2011-03-24 DIAGNOSIS — Z01811 Encounter for preprocedural respiratory examination: Secondary | ICD-10-CM | POA: Insufficient documentation

## 2011-03-24 DIAGNOSIS — C8589 Other specified types of non-Hodgkin lymphoma, extranodal and solid organ sites: Secondary | ICD-10-CM | POA: Insufficient documentation

## 2011-03-28 ENCOUNTER — Other Ambulatory Visit (HOSPITAL_COMMUNITY): Payer: Self-pay | Admitting: Oncology

## 2011-03-28 ENCOUNTER — Encounter: Payer: Self-pay | Admitting: Oncology

## 2011-03-28 DIAGNOSIS — C8193 Hodgkin lymphoma, unspecified, intra-abdominal lymph nodes: Secondary | ICD-10-CM

## 2011-03-28 DIAGNOSIS — Z5111 Encounter for antineoplastic chemotherapy: Secondary | ICD-10-CM

## 2011-03-28 LAB — CBC WITH DIFFERENTIAL/PLATELET
BASO%: 0.6 % (ref 0.0–2.0)
Basophils Absolute: 0.1 10*3/uL (ref 0.0–0.1)
Eosinophils Absolute: 0.2 10*3/uL (ref 0.0–0.5)
HCT: 39.9 % (ref 38.4–49.9)
HGB: 12.9 g/dL — ABNORMAL LOW (ref 13.0–17.1)
LYMPH%: 5.1 % — ABNORMAL LOW (ref 14.0–49.0)
MCHC: 32.3 g/dL (ref 32.0–36.0)
MONO#: 0.8 10*3/uL (ref 0.1–0.9)
NEUT%: 85.6 % — ABNORMAL HIGH (ref 39.0–75.0)
Platelets: 231 10*3/uL (ref 140–400)
WBC: 11.9 10*3/uL — ABNORMAL HIGH (ref 4.0–10.3)

## 2011-03-31 ENCOUNTER — Ambulatory Visit (HOSPITAL_COMMUNITY): Payer: Medicaid Other

## 2011-03-31 ENCOUNTER — Ambulatory Visit (HOSPITAL_COMMUNITY): Payer: Medicaid Other | Attending: Oncology

## 2011-03-31 ENCOUNTER — Other Ambulatory Visit (HOSPITAL_COMMUNITY): Payer: Self-pay | Admitting: Oncology

## 2011-03-31 DIAGNOSIS — C8589 Other specified types of non-Hodgkin lymphoma, extranodal and solid organ sites: Secondary | ICD-10-CM | POA: Insufficient documentation

## 2011-03-31 DIAGNOSIS — C8190 Hodgkin lymphoma, unspecified, unspecified site: Secondary | ICD-10-CM

## 2011-03-31 LAB — CBC
HCT: 35 % — ABNORMAL LOW (ref 39.0–52.0)
MCHC: 32.6 g/dL (ref 30.0–36.0)
Platelets: 185 10*3/uL (ref 150–400)
RDW: 21.7 % — ABNORMAL HIGH (ref 11.5–15.5)
WBC: 10 10*3/uL (ref 4.0–10.5)

## 2011-03-31 LAB — DIFFERENTIAL
Basophils Absolute: 0 10*3/uL (ref 0.0–0.1)
Eosinophils Absolute: 0.1 10*3/uL (ref 0.0–0.7)
Lymphocytes Relative: 6 % — ABNORMAL LOW (ref 12–46)
Neutro Abs: 8.7 10*3/uL — ABNORMAL HIGH (ref 1.7–7.7)
Neutrophils Relative %: 87 % — ABNORMAL HIGH (ref 43–77)

## 2011-04-04 ENCOUNTER — Encounter (HOSPITAL_BASED_OUTPATIENT_CLINIC_OR_DEPARTMENT_OTHER): Payer: Self-pay | Admitting: Oncology

## 2011-04-04 ENCOUNTER — Other Ambulatory Visit (HOSPITAL_COMMUNITY): Payer: Self-pay | Admitting: Oncology

## 2011-04-04 DIAGNOSIS — Z5111 Encounter for antineoplastic chemotherapy: Secondary | ICD-10-CM

## 2011-04-04 DIAGNOSIS — C8193 Hodgkin lymphoma, unspecified, intra-abdominal lymph nodes: Secondary | ICD-10-CM

## 2011-04-04 LAB — CBC WITH DIFFERENTIAL/PLATELET
EOS%: 3.6 % (ref 0.0–7.0)
Eosinophils Absolute: 0.2 10*3/uL (ref 0.0–0.5)
MCV: 92.9 fL (ref 79.3–98.0)
MONO%: 12.6 % (ref 0.0–14.0)
NEUT#: 4.7 10*3/uL (ref 1.5–6.5)
RBC: 3.95 10*6/uL — ABNORMAL LOW (ref 4.20–5.82)
RDW: 22.3 % — ABNORMAL HIGH (ref 11.0–14.6)
lymph#: 0.8 10*3/uL — ABNORMAL LOW (ref 0.9–3.3)
nRBC: 0 % (ref 0–0)

## 2011-04-11 ENCOUNTER — Encounter (HOSPITAL_BASED_OUTPATIENT_CLINIC_OR_DEPARTMENT_OTHER): Payer: Self-pay | Admitting: Oncology

## 2011-04-11 ENCOUNTER — Other Ambulatory Visit (HOSPITAL_COMMUNITY): Payer: Self-pay | Admitting: Oncology

## 2011-04-11 DIAGNOSIS — C8193 Hodgkin lymphoma, unspecified, intra-abdominal lymph nodes: Secondary | ICD-10-CM

## 2011-04-11 DIAGNOSIS — Z5111 Encounter for antineoplastic chemotherapy: Secondary | ICD-10-CM

## 2011-04-11 LAB — URIC ACID: Uric Acid, Serum: 4.2 mg/dL (ref 4.0–7.8)

## 2011-04-11 LAB — CBC WITH DIFFERENTIAL/PLATELET
Basophils Absolute: 0.1 10*3/uL (ref 0.0–0.1)
EOS%: 5.2 % (ref 0.0–7.0)
Eosinophils Absolute: 0.4 10*3/uL (ref 0.0–0.5)
HCT: 35.6 % — ABNORMAL LOW (ref 38.4–49.9)
HGB: 11.8 g/dL — ABNORMAL LOW (ref 13.0–17.1)
MCH: 31.1 pg (ref 27.2–33.4)
MCV: 93.9 fL (ref 79.3–98.0)
NEUT#: 4.2 10*3/uL (ref 1.5–6.5)
NEUT%: 62 % (ref 39.0–75.0)
RDW: 21.4 % — ABNORMAL HIGH (ref 11.0–14.6)
lymph#: 1.2 10*3/uL (ref 0.9–3.3)

## 2011-04-11 LAB — COMPREHENSIVE METABOLIC PANEL
ALT: <8 U/L (ref 0–53)
BUN: 9 mg/dL (ref 6–23)
CO2: 26 mEq/L (ref 19–32)
Calcium: 9.2 mg/dL (ref 8.4–10.5)
Creatinine, Ser: 0.64 mg/dL (ref 0.50–1.35)
Total Bilirubin: 0.6 mg/dL (ref 0.3–1.2)

## 2011-04-11 LAB — LACTATE DEHYDROGENASE: LDH: 171 U/L (ref 94–250)

## 2011-04-11 LAB — SEDIMENTATION RATE: Sed Rate: 22 mm/hr — ABNORMAL HIGH (ref 0–16)

## 2011-04-12 ENCOUNTER — Encounter (HOSPITAL_BASED_OUTPATIENT_CLINIC_OR_DEPARTMENT_OTHER): Payer: Self-pay | Admitting: Oncology

## 2011-04-12 DIAGNOSIS — Z5111 Encounter for antineoplastic chemotherapy: Secondary | ICD-10-CM

## 2011-04-12 DIAGNOSIS — Z5112 Encounter for antineoplastic immunotherapy: Secondary | ICD-10-CM

## 2011-04-12 DIAGNOSIS — C8193 Hodgkin lymphoma, unspecified, intra-abdominal lymph nodes: Secondary | ICD-10-CM

## 2011-04-13 ENCOUNTER — Encounter (HOSPITAL_BASED_OUTPATIENT_CLINIC_OR_DEPARTMENT_OTHER): Payer: Self-pay | Admitting: Oncology

## 2011-04-13 DIAGNOSIS — C8193 Hodgkin lymphoma, unspecified, intra-abdominal lymph nodes: Secondary | ICD-10-CM

## 2011-04-13 DIAGNOSIS — Z5189 Encounter for other specified aftercare: Secondary | ICD-10-CM

## 2011-04-27 ENCOUNTER — Encounter (HOSPITAL_BASED_OUTPATIENT_CLINIC_OR_DEPARTMENT_OTHER): Payer: Self-pay | Admitting: Oncology

## 2011-04-27 ENCOUNTER — Other Ambulatory Visit (HOSPITAL_COMMUNITY): Payer: Self-pay | Admitting: Oncology

## 2011-04-27 DIAGNOSIS — D702 Other drug-induced agranulocytosis: Secondary | ICD-10-CM

## 2011-04-27 DIAGNOSIS — Z5112 Encounter for antineoplastic immunotherapy: Secondary | ICD-10-CM

## 2011-04-27 DIAGNOSIS — C8193 Hodgkin lymphoma, unspecified, intra-abdominal lymph nodes: Secondary | ICD-10-CM

## 2011-04-27 DIAGNOSIS — Z5111 Encounter for antineoplastic chemotherapy: Secondary | ICD-10-CM

## 2011-04-27 LAB — CBC WITH DIFFERENTIAL/PLATELET
BASO%: 0.7 % (ref 0.0–2.0)
EOS%: 2.4 % (ref 0.0–7.0)
HCT: 34.3 % — ABNORMAL LOW (ref 38.4–49.9)
HGB: 11.4 g/dL — ABNORMAL LOW (ref 13.0–17.1)
MCV: 92.5 fL (ref 79.3–98.0)
NEUT#: 7.9 10*3/uL — ABNORMAL HIGH (ref 1.5–6.5)
Platelets: 231 10*3/uL (ref 140–400)
RBC: 3.71 10*6/uL — ABNORMAL LOW (ref 4.20–5.82)
WBC: 9.7 10*3/uL (ref 4.0–10.3)
lymph#: 0.7 10*3/uL — ABNORMAL LOW (ref 0.9–3.3)
nRBC: 0 % (ref 0–0)

## 2011-04-29 ENCOUNTER — Inpatient Hospital Stay (HOSPITAL_COMMUNITY)
Admission: EM | Admit: 2011-04-29 | Discharge: 2011-05-02 | DRG: 690 | Disposition: A | Discharge status: Home / Self Care | Payer: Medicaid Other | Attending: Internal Medicine | Admitting: Internal Medicine

## 2011-04-29 ENCOUNTER — Emergency Department (HOSPITAL_COMMUNITY): Payer: Medicaid Other

## 2011-04-29 DIAGNOSIS — J069 Acute upper respiratory infection, unspecified: Secondary | ICD-10-CM | POA: Diagnosis present

## 2011-04-29 DIAGNOSIS — I10 Essential (primary) hypertension: Secondary | ICD-10-CM | POA: Diagnosis present

## 2011-04-29 DIAGNOSIS — R627 Adult failure to thrive: Secondary | ICD-10-CM | POA: Diagnosis present

## 2011-04-29 DIAGNOSIS — F17200 Nicotine dependence, unspecified, uncomplicated: Secondary | ICD-10-CM | POA: Diagnosis present

## 2011-04-29 DIAGNOSIS — N390 Urinary tract infection, site not specified: Principal | ICD-10-CM | POA: Diagnosis present

## 2011-04-29 DIAGNOSIS — B961 Klebsiella pneumoniae [K. pneumoniae] as the cause of diseases classified elsewhere: Secondary | ICD-10-CM | POA: Diagnosis present

## 2011-04-29 DIAGNOSIS — E876 Hypokalemia: Secondary | ICD-10-CM | POA: Diagnosis present

## 2011-04-29 DIAGNOSIS — R059 Cough, unspecified: Secondary | ICD-10-CM | POA: Diagnosis present

## 2011-04-29 DIAGNOSIS — C8190 Hodgkin lymphoma, unspecified, unspecified site: Secondary | ICD-10-CM | POA: Diagnosis present

## 2011-04-29 DIAGNOSIS — R0602 Shortness of breath: Secondary | ICD-10-CM | POA: Diagnosis present

## 2011-04-29 DIAGNOSIS — E46 Unspecified protein-calorie malnutrition: Secondary | ICD-10-CM | POA: Diagnosis present

## 2011-04-29 DIAGNOSIS — R05 Cough: Secondary | ICD-10-CM | POA: Diagnosis present

## 2011-04-29 LAB — CBC
HCT: 36.1 % — ABNORMAL LOW (ref 39.0–52.0)
Hemoglobin: 12.1 g/dL — ABNORMAL LOW (ref 13.0–17.0)
MCH: 31.2 pg (ref 26.0–34.0)
MCHC: 33.5 g/dL (ref 30.0–36.0)
MCV: 93 fL (ref 78.0–100.0)
Platelets: 250 10*3/uL (ref 150–400)
RBC: 3.88 MIL/uL — ABNORMAL LOW (ref 4.22–5.81)
RDW: 21.5 % — ABNORMAL HIGH (ref 11.5–15.5)
WBC: 7.4 10*3/uL (ref 4.0–10.5)

## 2011-04-29 LAB — COMPREHENSIVE METABOLIC PANEL
ALT: <5 U/L (ref 0–53)
AST: 13 U/L (ref 0–37)
Albumin: 3.4 g/dL — ABNORMAL LOW (ref 3.5–5.2)
Alkaline Phosphatase: 197 U/L — ABNORMAL HIGH (ref 39–117)
BUN: 6 mg/dL (ref 6–23)
CO2: 28 mEq/L (ref 19–32)
Calcium: 9.5 mg/dL (ref 8.4–10.5)
Chloride: 101 mEq/L (ref 96–112)
Creatinine, Ser: 0.72 mg/dL (ref 0.50–1.35)
GFR calc Af Amer: >60 mL/min (ref >60)
GFR calc non Af Amer: >60 mL/min (ref >60)
Glucose, Bld: 86 mg/dL (ref 70–99)
Potassium: 2.8 mEq/L — ABNORMAL LOW (ref 3.5–5.1)
Sodium: 139 mEq/L (ref 135–145)
Total Bilirubin: 0.5 mg/dL (ref 0.3–1.2)
Total Protein: 7 g/dL (ref 6.0–8.3)

## 2011-04-29 LAB — DIFFERENTIAL
Basophils Absolute: 0.1 10*3/uL (ref 0.0–0.1)
Basophils Relative: 1 % (ref 0–1)
Eosinophils Absolute: 0.3 10*3/uL (ref 0.0–0.7)
Eosinophils Relative: 4 % (ref 0–5)
Lymphocytes Relative: 9 % — ABNORMAL LOW (ref 12–46)
Lymphs Abs: 0.7 10*3/uL (ref 0.7–4.0)
Monocytes Absolute: 1 10*3/uL (ref 0.1–1.0)
Monocytes Relative: 13 % — ABNORMAL HIGH (ref 3–12)
Neutro Abs: 5.3 10*3/uL (ref 1.7–7.7)
Neutrophils Relative %: 73 % (ref 43–77)

## 2011-04-29 LAB — URINALYSIS, ROUTINE W REFLEX MICROSCOPIC
Glucose, UA: NEGATIVE mg/dL
Hgb urine dipstick: NEGATIVE
Ketones, ur: NEGATIVE mg/dL
Nitrite: NEGATIVE
Protein, ur: NEGATIVE mg/dL
Specific Gravity, Urine: 1.015 (ref 1.005–1.030)
Urobilinogen, UA: 4 mg/dL — ABNORMAL HIGH (ref 0.0–1.0)
pH: 6.5 (ref 5.0–8.0)

## 2011-04-29 LAB — URINE MICROSCOPIC-ADD ON

## 2011-04-29 LAB — POCT I-STAT TROPONIN I: Troponin i, poc: 0.02 ng/mL (ref 0.00–0.08)

## 2011-04-29 LAB — LACTIC ACID, PLASMA: Lactic Acid, Venous: 1.6 mmol/L (ref 0.5–2.2)

## 2011-04-29 LAB — LIPASE, BLOOD: Lipase: 37 U/L (ref 11–59)

## 2011-04-30 LAB — BASIC METABOLIC PANEL
BUN: 5 mg/dL — ABNORMAL LOW (ref 6–23)
Creatinine, Ser: 0.69 mg/dL (ref 0.50–1.35)
GFR calc non Af Amer: >60 mL/min (ref >60)
Glucose, Bld: 77 mg/dL (ref 70–99)
Potassium: 3.5 mEq/L (ref 3.5–5.1)

## 2011-04-30 LAB — CBC
HCT: 31.3 % — ABNORMAL LOW (ref 39.0–52.0)
Hemoglobin: 10.3 g/dL — ABNORMAL LOW (ref 13.0–17.0)
MCHC: 32.9 g/dL (ref 30.0–36.0)
MCV: 94 fL (ref 78.0–100.0)

## 2011-04-30 LAB — DIFFERENTIAL
Basophils Absolute: 0.1 10*3/uL (ref 0.0–0.1)
Eosinophils Relative: 4 % (ref 0–5)
Lymphocytes Relative: 11 % — ABNORMAL LOW (ref 12–46)
Lymphs Abs: 0.8 10*3/uL (ref 0.7–4.0)
Monocytes Absolute: 0.8 10*3/uL (ref 0.1–1.0)
Monocytes Relative: 10 % (ref 3–12)
Neutro Abs: 5.4 10*3/uL (ref 1.7–7.7)

## 2011-05-01 DIAGNOSIS — C8190 Hodgkin lymphoma, unspecified, unspecified site: Secondary | ICD-10-CM

## 2011-05-01 DIAGNOSIS — R627 Adult failure to thrive: Secondary | ICD-10-CM

## 2011-05-01 LAB — BASIC METABOLIC PANEL WITH GFR
BUN: 4 mg/dL — ABNORMAL LOW (ref 6–23)
CO2: 23 meq/L (ref 19–32)
Calcium: 9.1 mg/dL (ref 8.4–10.5)
Chloride: 109 meq/L (ref 96–112)
Creatinine, Ser: 0.67 mg/dL (ref 0.50–1.35)
GFR calc Af Amer: >60 mL/min
GFR calc non Af Amer: >60 mL/min
Glucose, Bld: 104 mg/dL — ABNORMAL HIGH (ref 70–99)
Potassium: 4.1 meq/L (ref 3.5–5.1)
Sodium: 140 meq/L (ref 135–145)

## 2011-05-01 NOTE — H&P (Signed)
NAMEMarland Kitchen  KODI, STEIL NO.:  [id number]  MEDICAL RECORD NO.:  [id number]  LOCATION:  WLED                         FACILITY:  [facility]  PHYSICIAN:  Della Goo, M.D. DATE OF BIRTH:  [birth year]  DATE OF ADMISSION:  04/29/2011 DATE OF DISCHARGE:                             HISTORY & PHYSICAL   DATE OF ADMISSION:  April 29, 2011.  PRIMARY CARE PHYSICIAN:  None.  ONCOLOGIST:  [name], M.D.  CHIEF COMPLAINT:  Weakness, nausea.  HISTORY OF PRESENT ILLNESS:  This is a 60 year old male with a history of Hodgkin's disease who was currently on chemotherapy treatment, who presents to the emergency department secondary to complaints of increased weakness, poor intake of foods and liquids for the past 5 days along with nausea, but no vomiting and no diarrhea.  Patient reports having had cold symptoms, nasal congestion, and sinus pressure.  He denies having any fevers or chills.  Patient was seen in the emergencydepartment, evaluated, and found to have laboratory abnormalities of hypokalemia.  His urinalysis was also positive.  A chest x-ray was performed; however, this was negative for any findings of pneumonia. Patient was referred for medical admission.  PAST MEDICAL HISTORY: 1. Hodgkin's disease. 2. Hypertension.  PAST SURGICAL HISTORY:  None.  MEDICATIONS:  Medications will need to be further verified.  ALLERGIES:  Intolerance to ASPIRIN, which causes nausea.  SOCIAL HISTORY:  Patient lives alone.  He is a smoker.  He smokes one- pack of cigarettes daily for 30 years.  He is a nondrinker.  He denies any illicit drug usage.  FAMILY HISTORY:  Positive for coronary artery disease and hypertension in his mother, sister also has hypertension, and he also has 2 sisters with breast cancer.  REVIEW OF SYSTEMS:  Pertinent as mentioned above.  PHYSICAL EXAMINATION FINDINGS:  GENERAL:  This is a 60 year old thin cachectic-appearing African American  male, who is in no acute distress. VITAL SIGNS: Temperature 98.6, blood pressure 115/78, heart rate 71, respirations 18, and O2 saturations 100%. HEENT:  Normocephalic, atraumatic.  Pupils are equally round and reactive to light.  Extraocular movements are intact.  Funduscopic benign.  There is no scleral icterus.  Nares are patent bilaterally. Oropharynx is clear.  He is edentulous. NECK:  Supple.  Full range of motion.  No thyromegaly, adenopathy, or jugular venous distention. CARDIOVASCULAR:  Regular rate and rhythm.  Normal S1, S2.  No murmurs, gallops, or rubs appreciated. CHEST:  Chest wall nontender.  The patient has a Port-a-Cath present. The site is clean and is dressed. LUNGS:  Clear to auscultation bilaterally.  No rales, rhonchi, or wheezes. ABDOMEN:  Positive bowel sounds, soft, nontender, and nondistended.  No hepatosplenomegaly. EXTREMITIES:  Without cyanosis, clubbing, or edema. NEUROLOGIC:  Nonfocal.  LABORATORY STUDIES:  White blood cell count 7.4, hemoglobin 12.1, hematocrit 36.1, MCV 93.0, platelets 250, and neutrophils 73%.  Sodium 139, potassium 2.8, chloride 101, carbon dioxide 28, BUN 6, creatinine 0.72, glucose 86, albumin 3.4, AST 13, ALT less than 5, and lipase 37. Urinalysis reveals moderate leukocyte esterase.  Urine microscopic with 11-20 urine white blood cells, 0-2 red blood cells.  Chest x-ray reveals no acute cardiopulmonary disease findings.  ASSESSMENT:  [age]-year-old male being admitted with: 1. Failure to thrive syndrome. 2. Hypokalemia. 3. Urinary tract infection. 4. Upper respiratory infection most likely viral. 5. Hodgkin's disease. 6. Tobacco abuse.  PLAN:  Patient will be admitted, rehydration with IV fluids have been ordered.  Patient's potassium will be repleted and a magnesium level will also be checked.  A urine culture sensitivity will be sent. Patient will be started empirically on Macrobid therapy.  Antibiotic therapy will not  be ordered at this time for the upper respiratory infection; however, symptomatic therapies will be ordered, Mucinex will be ordered for congestion.  Antiemetics have also been ordered; however, patient's nausea may be secondary to the increased mucus from his upper respiratory infection.  Patient's regular medications will be further verified and nicotine patch will be ordered.  Code status is a full code at this time and DVT prophylaxis has been ordered.  Further workup will ensue pending results of patient's clinical course.     Della Goo, M.D.     HJ/MEDQ  D:  04/30/2011  T:  04/30/2011  Job:  [id number]  Electronically Signed by Della Goo M.D. on 05/01/2011 09:59:21 PM

## 2011-05-02 DIAGNOSIS — C8190 Hodgkin lymphoma, unspecified, unspecified site: Secondary | ICD-10-CM

## 2011-05-02 LAB — URINE CULTURE
Colony Count: >=100000
Culture  Setup Time: 201209150321

## 2011-05-02 LAB — BASIC METABOLIC PANEL
Calcium: 8.9 mg/dL (ref 8.4–10.5)
Creatinine, Ser: 0.62 mg/dL (ref 0.50–1.35)
GFR calc Af Amer: >60 mL/min (ref >60)
GFR calc non Af Amer: >60 mL/min (ref >60)

## 2011-05-06 LAB — CULTURE, BLOOD (ROUTINE X 2)
Culture  Setup Time: 201209150432
Culture  Setup Time: 201209150432
Culture: NO GROWTH
Culture: NO GROWTH

## 2011-05-09 ENCOUNTER — Other Ambulatory Visit (HOSPITAL_COMMUNITY): Payer: Self-pay | Admitting: Oncology

## 2011-05-09 ENCOUNTER — Encounter (HOSPITAL_BASED_OUTPATIENT_CLINIC_OR_DEPARTMENT_OTHER): Payer: Self-pay | Admitting: Oncology

## 2011-05-09 DIAGNOSIS — Z5112 Encounter for antineoplastic immunotherapy: Secondary | ICD-10-CM

## 2011-05-09 DIAGNOSIS — C8193 Hodgkin lymphoma, unspecified, intra-abdominal lymph nodes: Secondary | ICD-10-CM

## 2011-05-09 DIAGNOSIS — Z5111 Encounter for antineoplastic chemotherapy: Secondary | ICD-10-CM

## 2011-05-09 DIAGNOSIS — D702 Other drug-induced agranulocytosis: Secondary | ICD-10-CM

## 2011-05-09 LAB — CBC WITH DIFFERENTIAL/PLATELET
BASO%: 1.6 % (ref 0.0–2.0)
LYMPH%: 13.1 % — ABNORMAL LOW (ref 14.0–49.0)
MCHC: 33.1 g/dL (ref 32.0–36.0)
MONO#: 1.1 10*3/uL — ABNORMAL HIGH (ref 0.1–0.9)
Platelets: 252 10*3/uL (ref 140–400)
RBC: 3.87 10*6/uL — ABNORMAL LOW (ref 4.20–5.82)
WBC: 6.9 10*3/uL (ref 4.0–10.3)
lymph#: 0.9 10*3/uL (ref 0.9–3.3)
nRBC: 0 % (ref 0–0)

## 2011-05-09 LAB — CBC
Hemoglobin: 10.9 — ABNORMAL LOW
RDW: 16.2 — ABNORMAL HIGH

## 2011-05-10 ENCOUNTER — Encounter (HOSPITAL_BASED_OUTPATIENT_CLINIC_OR_DEPARTMENT_OTHER): Payer: Self-pay | Admitting: Oncology

## 2011-05-10 DIAGNOSIS — Z5111 Encounter for antineoplastic chemotherapy: Secondary | ICD-10-CM

## 2011-05-10 DIAGNOSIS — C8193 Hodgkin lymphoma, unspecified, intra-abdominal lymph nodes: Secondary | ICD-10-CM

## 2011-05-10 LAB — COMPREHENSIVE METABOLIC PANEL
ALT: <8 U/L (ref 0–53)
AST: 17 U/L (ref 0–37)
Albumin: 4 g/dL (ref 3.5–5.2)
Alkaline Phosphatase: 172 U/L — ABNORMAL HIGH (ref 39–117)
Glucose, Bld: 81 mg/dL (ref 70–99)
Potassium: 3.8 mEq/L (ref 3.5–5.3)
Sodium: 140 mEq/L (ref 135–145)
Total Bilirubin: 0.5 mg/dL (ref 0.3–1.2)
Total Protein: 6.6 g/dL (ref 6.0–8.3)

## 2011-05-10 LAB — CBC
HCT: 28.5 — ABNORMAL LOW
Hemoglobin: 9.9 — ABNORMAL LOW
RDW: 16.7 — ABNORMAL HIGH

## 2011-05-10 LAB — CROSSMATCH: Antibody Screen: NEGATIVE

## 2011-05-10 LAB — CULTURE, BLOOD (ROUTINE X 2)
Culture: NO GROWTH
Culture: NO GROWTH

## 2011-05-10 LAB — DIFFERENTIAL
Basophils Relative: 0
Monocytes Absolute: 1.2 — ABNORMAL HIGH
Monocytes Relative: 8
Neutro Abs: 10.5 — ABNORMAL HIGH

## 2011-05-10 LAB — SEDIMENTATION RATE: Sed Rate: 42 mm/hr — ABNORMAL HIGH (ref 0–16)

## 2011-05-11 ENCOUNTER — Encounter (HOSPITAL_BASED_OUTPATIENT_CLINIC_OR_DEPARTMENT_OTHER): Payer: Self-pay | Admitting: Oncology

## 2011-05-11 DIAGNOSIS — C8193 Hodgkin lymphoma, unspecified, intra-abdominal lymph nodes: Secondary | ICD-10-CM

## 2011-05-11 DIAGNOSIS — Z5189 Encounter for other specified aftercare: Secondary | ICD-10-CM

## 2011-05-23 ENCOUNTER — Encounter (HOSPITAL_BASED_OUTPATIENT_CLINIC_OR_DEPARTMENT_OTHER): Payer: Self-pay | Admitting: Oncology

## 2011-05-23 ENCOUNTER — Other Ambulatory Visit (HOSPITAL_COMMUNITY): Payer: Self-pay | Admitting: Oncology

## 2011-05-23 DIAGNOSIS — Z5111 Encounter for antineoplastic chemotherapy: Secondary | ICD-10-CM

## 2011-05-23 DIAGNOSIS — Z5112 Encounter for antineoplastic immunotherapy: Secondary | ICD-10-CM

## 2011-05-23 DIAGNOSIS — C8193 Hodgkin lymphoma, unspecified, intra-abdominal lymph nodes: Secondary | ICD-10-CM

## 2011-05-23 DIAGNOSIS — Z5189 Encounter for other specified aftercare: Secondary | ICD-10-CM

## 2011-05-23 LAB — CBC WITH DIFFERENTIAL/PLATELET
Basophils Absolute: 0.1 10*3/uL (ref 0.0–0.1)
Eosinophils Absolute: 0.5 10*3/uL (ref 0.0–0.5)
HCT: 33.4 % — ABNORMAL LOW (ref 38.4–49.9)
HGB: 10.9 g/dL — ABNORMAL LOW (ref 13.0–17.1)
LYMPH%: 9.1 % — ABNORMAL LOW (ref 14.0–49.0)
MCV: 94.6 fL (ref 79.3–98.0)
MONO%: 6.4 % (ref 0.0–14.0)
NEUT#: 12 10*3/uL — ABNORMAL HIGH (ref 1.5–6.5)
Platelets: 264 10*3/uL (ref 140–400)

## 2011-06-08 NOTE — Discharge Summary (Signed)
  Keith Bell, Keith Bell NO.:  [id number]  MEDICAL RECORD NO.:  [id number]  LOCATION:                                 FACILITY:  PHYSICIAN:  Zannie Cove, MD     DATE OF BIRTH:  1951/01/30  DATE OF ADMISSION:  04/29/2011 DATE OF DISCHARGE:  05/02/2011                              DISCHARGE SUMMARY   PRIMARY CARE PHYSICIAN:  None.  ONCOLOGIST:  [name], MD  DISCHARGE DIAGNOSES: 1. Klebsiella urinary tract infection. 2. Recurrent Hodgkin lymphoma. 3. Upper respiratory infection. 4. Protein-calorie malnutrition. 5. Adult failure to thrive. 6. Hyperkalemia resolved.  DISCHARGE MEDICATIONS: 1. Ciprofloxacin 500 mg p.o. b.i.d. for 4 days. 2. Guaifenesin 600 mg tab LA p.o. b.i.d. p.r.n. 3. Ensure 1 can b.i.d. 4. Norco 10/325 half to 1 tab q.4 h. p.r.n. for pain.  CONSULTANTS:  [facility].  DIAGNOSTICS:  Chest x-ray on May 29, 2011, no acute cardiopulmonary disease.  HOSPITAL COURSE:  Keith Bell is a 60 year old gentleman with history of recurrent Hodgkin lymphoma who just recently completed his sixth cycle of chemotherapy in the end of August presented to the hospital with increasing cough, weakness and nausea.  On evaluation, he was found to have a urinary tract infection, hypokalemia, and probably viral URI symptoms. 1. For his recurrent Hodgkin lymphoma, he was seen by Dr. Myna Hidalgo and     Associates through his hospital stay.  He will follow up with Dr.     Arline Asp after discharge. 2. URI felt to be viral.  Chest x-ray was unremarkable.  Initially     treated empirically with IV Avelox was subsequently discontinued.     Symptoms improved with Mucinex. 3. Klebsiella urinary tract infection, initially treated with Rocephin     and subsequently transitioned to ciprofloxacin, will go home on 4     more days of Cipro.  Rest of his chronic medical problems remained stable.  DISCHARGE CONDITION:   Stable.  DISCHARGE FOLLOWUP:  Dr. Arline Asp in 1-2 week's time.     Zannie Cove, MD     PJ/MEDQ  D:  06/02/2011  T:  06/02/2011  Job:  [id number]  Electronically Signed by Zannie Cove  on 06/08/2011 02:59:03 PM

## 2011-06-14 ENCOUNTER — Other Ambulatory Visit (HOSPITAL_COMMUNITY): Payer: Self-pay | Admitting: Oncology

## 2011-06-14 ENCOUNTER — Encounter (HOSPITAL_BASED_OUTPATIENT_CLINIC_OR_DEPARTMENT_OTHER): Payer: Medicaid Other | Admitting: Oncology

## 2011-06-14 DIAGNOSIS — C8193 Hodgkin lymphoma, unspecified, intra-abdominal lymph nodes: Secondary | ICD-10-CM

## 2011-06-14 DIAGNOSIS — Z5111 Encounter for antineoplastic chemotherapy: Secondary | ICD-10-CM

## 2011-06-14 DIAGNOSIS — Z5112 Encounter for antineoplastic immunotherapy: Secondary | ICD-10-CM

## 2011-06-14 DIAGNOSIS — Z5189 Encounter for other specified aftercare: Secondary | ICD-10-CM

## 2011-06-14 LAB — CBC WITH DIFFERENTIAL/PLATELET
BASO%: 1.2 % (ref 0.0–2.0)
Basophils Absolute: 0.1 10*3/uL (ref 0.0–0.1)
EOS%: 13.4 % — ABNORMAL HIGH (ref 0.0–7.0)
HCT: 38.9 % (ref 38.4–49.9)
HGB: 12.6 g/dL — ABNORMAL LOW (ref 13.0–17.1)
LYMPH%: 16 % (ref 14.0–49.0)
MCH: 30.7 pg (ref 27.2–33.4)
MCHC: 32.4 g/dL (ref 32.0–36.0)
MONO#: 0.7 10*3/uL (ref 0.1–0.9)
NEUT%: 58 % (ref 39.0–75.0)
Platelets: 228 10*3/uL (ref 140–400)

## 2011-06-14 LAB — COMPREHENSIVE METABOLIC PANEL
BUN: 11 mg/dL (ref 6–23)
CO2: 23 mEq/L (ref 19–32)
Creatinine, Ser: 0.96 mg/dL (ref 0.50–1.35)
Glucose, Bld: 119 mg/dL — ABNORMAL HIGH (ref 70–99)
Total Bilirubin: 0.3 mg/dL (ref 0.3–1.2)

## 2011-06-14 LAB — LACTATE DEHYDROGENASE: LDH: 182 U/L (ref 94–250)

## 2011-06-14 LAB — SEDIMENTATION RATE: Sed Rate: 38 mm/hr — ABNORMAL HIGH (ref 0–16)

## 2011-06-20 ENCOUNTER — Other Ambulatory Visit: Payer: Self-pay | Admitting: Oncology

## 2011-06-20 ENCOUNTER — Ambulatory Visit (HOSPITAL_BASED_OUTPATIENT_CLINIC_OR_DEPARTMENT_OTHER): Payer: Medicaid Other

## 2011-06-20 VITALS — BP 138/84 | HR 79 | Temp 98.4°F | Ht 65.5 in | Wt 132.6 lb

## 2011-06-20 DIAGNOSIS — C8190 Hodgkin lymphoma, unspecified, unspecified site: Secondary | ICD-10-CM

## 2011-06-20 DIAGNOSIS — Z5111 Encounter for antineoplastic chemotherapy: Secondary | ICD-10-CM

## 2011-06-20 DIAGNOSIS — C8193 Hodgkin lymphoma, unspecified, intra-abdominal lymph nodes: Secondary | ICD-10-CM

## 2011-06-20 MED ORDER — ONDANSETRON 16 MG/50ML IVPB (CHCC)
16.0000 mg | Freq: Once | INTRAVENOUS | Status: AC
  Administered 2011-06-20: 16 mg via INTRAVENOUS

## 2011-06-20 MED ORDER — DEXAMETHASONE SODIUM PHOSPHATE 10 MG/ML IJ SOLN
20.0000 mg | Freq: Once | INTRAMUSCULAR | Status: AC
  Administered 2011-06-20: 20 mg via INTRAVENOUS
  Filled 2011-06-20: qty 2

## 2011-06-20 MED ORDER — SODIUM CHLORIDE 0.9 % IV SOLN
114.0000 mg/m2 | Freq: Once | INTRAVENOUS | Status: AC
  Administered 2011-06-20: 190 mg via INTRAVENOUS
  Filled 2011-06-20: qty 38

## 2011-06-20 NOTE — Patient Instructions (Signed)
Pt d/c ambulatory with next appointment confirmed.

## 2011-06-21 ENCOUNTER — Ambulatory Visit (HOSPITAL_BASED_OUTPATIENT_CLINIC_OR_DEPARTMENT_OTHER): Payer: Medicaid Other

## 2011-06-21 ENCOUNTER — Encounter: Payer: Self-pay | Admitting: Certified Registered Nurse Anesthetist

## 2011-06-21 VITALS — BP 151/79 | HR 59 | Temp 98.9°F

## 2011-06-21 DIAGNOSIS — Z5111 Encounter for antineoplastic chemotherapy: Secondary | ICD-10-CM

## 2011-06-21 DIAGNOSIS — C8190 Hodgkin lymphoma, unspecified, unspecified site: Secondary | ICD-10-CM

## 2011-06-21 DIAGNOSIS — C8193 Hodgkin lymphoma, unspecified, intra-abdominal lymph nodes: Secondary | ICD-10-CM

## 2011-06-21 DIAGNOSIS — Z5112 Encounter for antineoplastic immunotherapy: Secondary | ICD-10-CM

## 2011-06-21 MED ORDER — ACETAMINOPHEN 325 MG PO TABS
650.0000 mg | ORAL_TABLET | Freq: Once | ORAL | Status: AC
  Administered 2011-06-21: 650 mg via ORAL

## 2011-06-21 MED ORDER — ONDANSETRON 16 MG/50ML IVPB (CHCC)
16.0000 mg | Freq: Once | INTRAVENOUS | Status: AC
  Administered 2011-06-21: 16 mg via INTRAVENOUS

## 2011-06-21 MED ORDER — SODIUM CHLORIDE 0.9 % IV SOLN
114.0000 mg/m2 | Freq: Once | INTRAVENOUS | Status: AC
  Administered 2011-06-21: 190 mg via INTRAVENOUS
  Filled 2011-06-21: qty 38

## 2011-06-21 MED ORDER — SODIUM CHLORIDE 0.9 % IV SOLN
375.0000 mg/m2 | Freq: Once | INTRAVENOUS | Status: AC
  Administered 2011-06-21: 600 mg via INTRAVENOUS
  Filled 2011-06-21: qty 60

## 2011-06-21 MED ORDER — SODIUM CHLORIDE 0.9 % IJ SOLN
10.0000 mL | INTRAMUSCULAR | Status: DC | PRN
  Administered 2011-06-21: 10 mL
  Filled 2011-06-21: qty 10

## 2011-06-21 MED ORDER — DIPHENHYDRAMINE HCL 50 MG/ML IJ SOLN
25.0000 mg | Freq: Once | INTRAMUSCULAR | Status: AC
  Administered 2011-06-21: 25 mg via INTRAVENOUS

## 2011-06-21 MED ORDER — HEPARIN SOD (PORK) LOCK FLUSH 100 UNIT/ML IV SOLN
500.0000 [IU] | Freq: Once | INTRAVENOUS | Status: AC | PRN
  Administered 2011-06-21: 500 [IU]
  Filled 2011-06-21: qty 5

## 2011-06-21 MED ORDER — DEXAMETHASONE SODIUM PHOSPHATE 10 MG/ML IJ SOLN
20.0000 mg | Freq: Once | INTRAMUSCULAR | Status: AC
  Administered 2011-06-21: 20 mg via INTRAVENOUS

## 2011-06-21 NOTE — Progress Notes (Signed)
Rituxan started at 0945 06/21/11 at 138mls/hr X . Pt tolerated well. 1015: rate increased to 224mls/hr X 

## 2011-06-22 ENCOUNTER — Other Ambulatory Visit: Payer: Self-pay | Admitting: Oncology

## 2011-06-22 ENCOUNTER — Ambulatory Visit (HOSPITAL_BASED_OUTPATIENT_CLINIC_OR_DEPARTMENT_OTHER): Payer: Medicaid Other

## 2011-06-22 ENCOUNTER — Other Ambulatory Visit: Payer: Self-pay | Admitting: Medical Oncology

## 2011-06-22 VITALS — BP 132/83 | HR 76 | Temp 97.2°F

## 2011-06-22 DIAGNOSIS — Z5189 Encounter for other specified aftercare: Secondary | ICD-10-CM

## 2011-06-22 DIAGNOSIS — C8190 Hodgkin lymphoma, unspecified, unspecified site: Secondary | ICD-10-CM

## 2011-06-22 DIAGNOSIS — C8193 Hodgkin lymphoma, unspecified, intra-abdominal lymph nodes: Secondary | ICD-10-CM

## 2011-06-22 MED ORDER — ONDANSETRON HCL 8 MG PO TABS: 8.0000 mg | ORAL_TABLET | Freq: Three times a day (TID) | ORAL | Status: AC | PRN

## 2011-06-22 MED ORDER — PEGFILGRASTIM INJECTION 6 MG/0.6ML
6.0000 mg | Freq: Once | SUBCUTANEOUS | Status: DC
  Filled 2011-06-22: qty 0.6

## 2011-06-22 MED ORDER — PROCHLORPERAZINE MALEATE 10 MG PO TABS: 10.0000 mg | ORAL_TABLET | Freq: Four times a day (QID) | ORAL | Status: DC | PRN

## 2011-06-22 MED ORDER — PEGFILGRASTIM INJECTION 6 MG/0.6ML
6.0000 mg | Freq: Once | SUBCUTANEOUS | Status: AC
  Administered 2011-06-22: 6 mg via SUBCUTANEOUS
  Filled 2011-06-22: qty 0.6

## 2011-06-22 NOTE — Telephone Encounter (Signed)
Called compazine to [facility]

## 2011-07-13 ENCOUNTER — Encounter: Payer: Self-pay | Admitting: Medical Oncology

## 2011-07-15 ENCOUNTER — Other Ambulatory Visit: Payer: Self-pay | Admitting: Nurse Practitioner

## 2011-07-17 ENCOUNTER — Other Ambulatory Visit: Payer: Self-pay | Admitting: Oncology

## 2011-07-18 ENCOUNTER — Ambulatory Visit: Payer: Medicaid Other | Admitting: Oncology

## 2011-07-18 ENCOUNTER — Telehealth: Payer: Self-pay | Admitting: Oncology

## 2011-07-18 ENCOUNTER — Telehealth: Payer: Self-pay

## 2011-07-18 ENCOUNTER — Other Ambulatory Visit (HOSPITAL_COMMUNITY): Payer: Self-pay | Admitting: Oncology

## 2011-07-18 ENCOUNTER — Ambulatory Visit (HOSPITAL_BASED_OUTPATIENT_CLINIC_OR_DEPARTMENT_OTHER): Payer: Medicaid Other

## 2011-07-18 ENCOUNTER — Other Ambulatory Visit (HOSPITAL_BASED_OUTPATIENT_CLINIC_OR_DEPARTMENT_OTHER): Payer: Medicaid Other

## 2011-07-18 VITALS — BP 159/83 | HR 64 | Temp 98.7°F

## 2011-07-18 DIAGNOSIS — C8190 Hodgkin lymphoma, unspecified, unspecified site: Secondary | ICD-10-CM

## 2011-07-18 DIAGNOSIS — J449 Chronic obstructive pulmonary disease, unspecified: Secondary | ICD-10-CM

## 2011-07-18 DIAGNOSIS — Z5111 Encounter for antineoplastic chemotherapy: Secondary | ICD-10-CM

## 2011-07-18 DIAGNOSIS — I739 Peripheral vascular disease, unspecified: Secondary | ICD-10-CM | POA: Insufficient documentation

## 2011-07-18 LAB — CBC WITH DIFFERENTIAL/PLATELET
BASO%: 0.7 % (ref 0.0–2.0)
Eosinophils Absolute: 0.4 10*3/uL (ref 0.0–0.5)
HCT: 35.4 % — ABNORMAL LOW (ref 38.4–49.9)
LYMPH%: 19.2 % (ref 14.0–49.0)
MONO#: 0.6 10*3/uL (ref 0.1–0.9)
NEUT#: 4.5 10*3/uL (ref 1.5–6.5)
Platelets: 184 10*3/uL (ref 140–400)
RBC: 3.75 10*6/uL — ABNORMAL LOW (ref 4.20–5.82)
WBC: 6.9 10*3/uL (ref 4.0–10.3)
lymph#: 1.3 10*3/uL (ref 0.9–3.3)
nRBC: 0 % (ref 0–0)

## 2011-07-18 LAB — COMPREHENSIVE METABOLIC PANEL
Albumin: 4.1 g/dL (ref 3.5–5.2)
BUN: 8 mg/dL (ref 6–23)
CO2: 25 mEq/L (ref 19–32)
Calcium: 9 mg/dL (ref 8.4–10.5)
Chloride: 108 mEq/L (ref 96–112)
Glucose, Bld: 85 mg/dL (ref 70–99)
Potassium: 3.1 mEq/L — ABNORMAL LOW (ref 3.5–5.3)

## 2011-07-18 LAB — SEDIMENTATION RATE: Sed Rate: 32 mm/hr — ABNORMAL HIGH (ref 0–16)

## 2011-07-18 MED ORDER — SODIUM CHLORIDE 0.9 % IV SOLN
114.0000 mg/m2 | Freq: Once | INTRAVENOUS | Status: AC
  Administered 2011-07-18: 190 mg via INTRAVENOUS
  Filled 2011-07-18: qty 38

## 2011-07-18 MED ORDER — SODIUM CHLORIDE 0.9 % IV SOLN
375.0000 mg/m2 | Freq: Once | INTRAVENOUS | Status: AC
  Administered 2011-07-18: 600 mg via INTRAVENOUS
  Filled 2011-07-18: qty 60

## 2011-07-18 MED ORDER — SODIUM CHLORIDE 0.9 % IV SOLN: 375.0000 mg/m2 | Freq: Once | INTRAVENOUS | Status: DC

## 2011-07-18 MED ORDER — ONDANSETRON 16 MG/50ML IVPB (CHCC)
16.0000 mg | Freq: Once | INTRAVENOUS | Status: AC
  Administered 2011-07-18: 16 mg via INTRAVENOUS

## 2011-07-18 MED ORDER — DIPHENHYDRAMINE HCL 25 MG PO CAPS
50.0000 mg | ORAL_CAPSULE | Freq: Once | ORAL | Status: AC
  Administered 2011-07-18: 50 mg via ORAL

## 2011-07-18 MED ORDER — SODIUM CHLORIDE 0.9 % IJ SOLN
10.0000 mL | INTRAMUSCULAR | Status: DC | PRN
  Administered 2011-07-18: 10 mL
  Filled 2011-07-18: qty 10

## 2011-07-18 MED ORDER — HEPARIN SOD (PORK) LOCK FLUSH 100 UNIT/ML IV SOLN
500.0000 [IU] | Freq: Once | INTRAVENOUS | Status: AC | PRN
  Administered 2011-07-18: 500 [IU]
  Filled 2011-07-18: qty 5

## 2011-07-18 MED ORDER — SODIUM CHLORIDE 0.9 % IV SOLN
Freq: Once | INTRAVENOUS | Status: AC
  Administered 2011-07-18: 12:00:00 via INTRAVENOUS

## 2011-07-18 MED ORDER — ACETAMINOPHEN 325 MG PO TABS
650.0000 mg | ORAL_TABLET | Freq: Once | ORAL | Status: AC
  Administered 2011-07-18: 650 mg via ORAL

## 2011-07-18 MED ORDER — DEXAMETHASONE SODIUM PHOSPHATE 4 MG/ML IJ SOLN
20.0000 mg | Freq: Once | INTRAMUSCULAR | Status: AC
  Administered 2011-07-18: 20 mg via INTRAVENOUS

## 2011-07-18 NOTE — Telephone Encounter (Signed)
gve the pt his jan 2013 appt calendar °

## 2011-07-18 NOTE — Patient Instructions (Signed)
1610-Pt discharged ambulatory with next appointment confirmed.  Pt aware to call with any questions or concerns.

## 2011-07-18 NOTE — Progress Notes (Signed)
This office note has been dictated.  #[id number]

## 2011-07-18 NOTE — Telephone Encounter (Signed)
I called Gus Puma at [facility] asking where the pt stands for treatment.  She said she would contact Dr Lance Bosch and call us back today or tomorrow as Dr Lance Bosch is out of office today. Kathie Rhodes had called [place] on 06/10/11 that the pt's medicaid is active.

## 2011-07-18 NOTE — Progress Notes (Signed)
CC:   [name], M.D. [name], MD Pamella Pert, MD Cindra Eves, D.D.S.  HISTORY:  Keith Bell was seen today for followup of his recurrent Hodgkin's lymphoma with diagnosis going back to March 2009 at which time Keith Bell presented with stage IVB disease.  Bone marrow was involved.  Keith Bell has been receiving bendamustine and Rituxan with Neulasta since November 16, 2010.  He has had Medicaid off and on.  We have been trying to get him to autologous bone marrow transplant preceded by high-dose chemotherapy. This plan has been complicated by intermittent losses of Medicaid.  Keith Bell tells me that Medicaid is now in effect.  When he was last here on 06/14/2011, his Medicaid jad just become active.  We had communicated with Melanie Crazier at [facility] to inform her and Dr. Lance Bosch that [name]'s Medicaid was in effect.  Unfortunately, we have not heard anything from them over the past month or so, nor has Darrie heard from them.  We are continuing to tread water with chemotherapy for now.  Keith Bell is here today for the beginning of his 9th cycle of this chemotherapy program.  Clinically, Zadyn denies any major changes.  He said his biggest problem is from the Neulasta.  He is having some nocturia but denies any dysuria.  In general, he has tolerated his treatments well without any major complications.  He continues to have claudication involving his right leg.  That has been ongoing problem.  Keith Bell denies any dyspnea on exertion although he does have COPD.  PROBLEM LIST: 1. Hodgkin's lymphoma, diagnosed in March 2009, stage IV with positive     bone marrow on 11/21/2007.  Keith Bell had a biopsy of a retroperitoneal     lymph node on 11/13/2007 to establish the diagnosis.  At that time,     he had intra-abdominal disease and splenomegaly.  He was having     night sweats and fevers so he was stage IVB.  He received     Adriamycin, Velban, DTIC and Decadron  approximately 4 and a half     cycles from  11/29/2007 through 04/03/2008 with only a partial     response.  Bleomycin was omitted because of Ad's underlying COPD.     He then received Rituxan with COPP for 6 cycles from 04/18/2008     through February 2010.  He had an initial partial response and then     progressive disease.  This was determined on 10/30/2008 by a PET-CT     scan.  Keith Bell then received the RICE program for 6 cycles from     12/15/2008 through late August 2010 with an apparent complete     remission.  He then had recurrence of disease noted and was treated     with Doxil, Navelbine and gemcitabine for 8 and a half cycles from     October 15, 2009 through April 21, 2010 with a partial response.     He then received brentuximab (Adcetris) from 05/25/2010 through     10/21/2010 with unfortunately progressive disease.  He was then     switched to the current program of bendamustine, Rituxan and     Neulasta on 11/16/2010 and has been receiving that treatment about     every month, currently on his 9th treatment.  His last PET scan on     03/11/2011 showed a complete response.  A bone marrow on 03/31/2011     was negative. 2. Chronic obstructive pulmonary disease. 3.  Peripheral vascular disease with claudication. 4. Recurrent hypokalemia. 5. History of hypertension. 6. Dyslipidemia. 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  CURRENT MEDICINES: 1. Vicodin. 2. Zofran as needed. 3. Keith Bell 20 mEq daily.  (I am not sure if the patient is being     compliant with this.) 4. Compazine as needed.  PHYSICAL EXAM:  General:  There is little change.  Keith Bell continues to look somewhat cachectic.  He has lost a considerable amount of weight through this illness.  Today his weight is 132.3 pounds.  Height 5 feet, 5-1/2 inches.  Body surface area 1.67 m squared, in the normal range. Vital Signs:  Blood pressure 159/83.  Keith Bell vital signs are normal.  The patient is afebrile.  HEENT:  There is no scleral icterus.  Mouth  and pharynx are benign.  Keith Bell is edentulous.  Lymphatics:  No peripheral adenopathy palpable.  No axillary or inguinal adenopathy.  Heart/Lungs: Normal.  Chest wall:  There is a right-sided Port-A-Cath.  Breath sounds in the past have been diminished.  Abdomen:  Benign with no organomegaly or masses palpable.  Extremities:  No peripheral edema.  No clubbing. Neurologic:  Exam is grossly normal.  LABORATORY DATA:  Today white count 6.9, ANC 4.5, hemoglobin 12.0, hematocrit 35.4, and platelets a 184,000.  Chemistries from today notable for a potassium of 3.1, alkaline phosphatase 183 which is stable, albumin of 4.1, LDH 210.  Sedimentation rate today is 32.  IMPRESSION AND PLAN:  We will go ahead with the 9th cycle of chemotherapy with bendamustine 190 mg today and tomorrow.  Rituxan will be given today 650 mg.  We will skip interim blood counts.  Noriel has an appointment to see [place] again on or about January 3rd at which time, he will have a CBC, chemistries with LDH and sedimentation rate.  In the meantime, we once again have called [facility].  We are trying to get Keith Bell an appointment there or at least to get some direction from them about what needs to be done in order for Keith Bell to have high-dose therapy and autologous stem cell rescue.  As stated previously, his PET scan on 03/11/2011 showed a complete remission.  I suspect he is going to need another PET scan.  His last bone marrow was on 03/31/2011, also with no evidence of disease.    ______________________________ [name], M.D. DSM/MEDQ  D:  07/18/2011  T:  07/18/2011  Job:  [id number]

## 2011-07-19 ENCOUNTER — Ambulatory Visit (HOSPITAL_BASED_OUTPATIENT_CLINIC_OR_DEPARTMENT_OTHER): Payer: Medicaid Other

## 2011-07-19 VITALS — BP 131/81 | HR 67 | Temp 98.3°F

## 2011-07-19 DIAGNOSIS — Z5111 Encounter for antineoplastic chemotherapy: Secondary | ICD-10-CM

## 2011-07-19 DIAGNOSIS — C8193 Hodgkin lymphoma, unspecified, intra-abdominal lymph nodes: Secondary | ICD-10-CM

## 2011-07-19 DIAGNOSIS — C8190 Hodgkin lymphoma, unspecified, unspecified site: Secondary | ICD-10-CM

## 2011-07-19 MED ORDER — ONDANSETRON 16 MG/50ML IVPB (CHCC)
16.0000 mg | Freq: Once | INTRAVENOUS | Status: AC
  Administered 2011-07-19: 16 mg via INTRAVENOUS

## 2011-07-19 MED ORDER — DEXAMETHASONE SODIUM PHOSPHATE 10 MG/ML IJ SOLN
20.0000 mg | Freq: Once | INTRAMUSCULAR | Status: AC
  Administered 2011-07-19: 20 mg via INTRAVENOUS

## 2011-07-19 MED ORDER — SODIUM CHLORIDE 0.9 % IV SOLN
114.0000 mg/m2 | Freq: Once | INTRAVENOUS | Status: AC
  Administered 2011-07-19: 190 mg via INTRAVENOUS
  Filled 2011-07-19: qty 38

## 2011-07-19 MED ORDER — SODIUM CHLORIDE 0.9 % IJ SOLN
10.0000 mL | INTRAMUSCULAR | Status: DC | PRN
  Administered 2011-07-19: 10 mL
  Filled 2011-07-19: qty 10

## 2011-07-19 MED ORDER — SODIUM CHLORIDE 0.9 % IV SOLN: Freq: Once | INTRAVENOUS | Status: DC

## 2011-07-19 MED ORDER — HEPARIN SOD (PORK) LOCK FLUSH 100 UNIT/ML IV SOLN
500.0000 [IU] | Freq: Once | INTRAVENOUS | Status: AC | PRN
  Administered 2011-07-19: 500 [IU]
  Filled 2011-07-19: qty 5

## 2011-07-20 ENCOUNTER — Telehealth: Payer: Self-pay | Admitting: Medical Oncology

## 2011-07-20 ENCOUNTER — Ambulatory Visit (HOSPITAL_BASED_OUTPATIENT_CLINIC_OR_DEPARTMENT_OTHER): Payer: Medicaid Other

## 2011-07-20 VITALS — BP 131/84 | HR 78 | Temp 98.0°F

## 2011-07-20 DIAGNOSIS — C8193 Hodgkin lymphoma, unspecified, intra-abdominal lymph nodes: Secondary | ICD-10-CM

## 2011-07-20 DIAGNOSIS — C8190 Hodgkin lymphoma, unspecified, unspecified site: Secondary | ICD-10-CM

## 2011-07-20 MED ORDER — PEGFILGRASTIM INJECTION 6 MG/0.6ML
6.0000 mg | Freq: Once | SUBCUTANEOUS | Status: AC
  Administered 2011-07-20: 6 mg via SUBCUTANEOUS
  Filled 2011-07-20: qty 0.6

## 2011-07-20 NOTE — Telephone Encounter (Signed)
Kathie Rhodes called this am and left a message asking for update on patients chemo and scans. I called her back and left her a message that his PET was 03/11/11 and bone marrow 03/31/11

## 2011-07-20 NOTE — Telephone Encounter (Signed)
Gus Puma, RN called to let us know she got the message regarding pt's scans and treatments. She requested that Dr. Arline Asp order a PET scan and if the results are negative to order PFT's and echo. Dr. Arline Asp notified.

## 2011-07-26 ENCOUNTER — Telehealth: Payer: Self-pay | Admitting: Medical Oncology

## 2011-07-26 ENCOUNTER — Other Ambulatory Visit: Payer: Self-pay | Admitting: Oncology

## 2011-07-26 DIAGNOSIS — C8590 Non-Hodgkin lymphoma, unspecified, unspecified site: Secondary | ICD-10-CM

## 2011-07-26 DIAGNOSIS — C8190 Hodgkin lymphoma, unspecified, unspecified site: Secondary | ICD-10-CM

## 2011-07-26 MED ORDER — HYDROCODONE-HOMATROPINE 5-1.5 MG/5ML PO SYRP: 5.0000 mL | ORAL_SOLUTION | Freq: Four times a day (QID) | ORAL | Status: AC | PRN

## 2011-07-26 NOTE — Telephone Encounter (Signed)
Pt called stating that he has a terrible cough. He would like to know if Dr. Arline Asp could call him some cough syrup. Per Dr. Arline Asp we can call him in hycodan.  I also informed pt that we are going to set him up for a PET scan. If this is negative we will need to set him up for PFT's and 2 echo.

## 2011-07-28 ENCOUNTER — Telehealth: Payer: Self-pay | Admitting: Oncology

## 2011-07-28 NOTE — Telephone Encounter (Signed)
S/w the pt and he is aware of bhis pet scan appt on 08/02/2011@7 :00am with [facility].

## 2011-08-02 ENCOUNTER — Encounter (HOSPITAL_COMMUNITY)
Admission: RE | Admit: 2011-08-02 | Discharge: 2011-08-02 | Disposition: A | Discharge status: Home / Self Care | Payer: Medicaid Other | Source: Ambulatory Visit | Attending: Oncology | Admitting: Oncology

## 2011-08-02 ENCOUNTER — Encounter (HOSPITAL_COMMUNITY): Payer: Self-pay

## 2011-08-02 DIAGNOSIS — I2510 Atherosclerotic heart disease of native coronary artery without angina pectoris: Secondary | ICD-10-CM | POA: Insufficient documentation

## 2011-08-02 DIAGNOSIS — C8190 Hodgkin lymphoma, unspecified, unspecified site: Secondary | ICD-10-CM

## 2011-08-02 LAB — GLUCOSE, CAPILLARY: Glucose-Capillary: 104 mg/dL — ABNORMAL HIGH (ref 70–99)

## 2011-08-02 MED ORDER — FLUDEOXYGLUCOSE F - 18 (FDG) INJECTION
15.7000 | Freq: Once | INTRAVENOUS | Status: AC | PRN
  Administered 2011-08-02: 15.7 via INTRAVENOUS

## 2011-08-03 ENCOUNTER — Telehealth: Payer: Self-pay | Admitting: *Deleted

## 2011-08-03 DIAGNOSIS — C8190 Hodgkin lymphoma, unspecified, unspecified site: Secondary | ICD-10-CM

## 2011-08-03 MED ORDER — LEVOFLOXACIN 500 MG PO TABS: 500.0000 mg | ORAL_TABLET | Freq: Every day | ORAL | Status: AC

## 2011-08-03 NOTE — Telephone Encounter (Signed)
Received call from pt stating :  The cough medicine that was prescribed to pt not helping much.  Pt still has very dry coughs - mostly at night which keeps pt from sleeping.   Pt stated he just has clear sputum when coughing is productive.   Pt stated he might need antibiotics which pt stated helped him in the past. Dr. Arline Asp notified.  Rx for Levaquin 500 mg po daily x 7 days called pt's pharmacy.   Spoke with pt at home and informed pt of above info.  Pt voiced understanding. Pt's   Phone  [phone].

## 2011-08-10 ENCOUNTER — Encounter: Payer: Self-pay | Admitting: Medical Oncology

## 2011-08-10 NOTE — Progress Notes (Signed)
PET scan 07/23/11  And office notes from 07/18/11 faxed to Dr. Lance Bosch [phone]

## 2011-08-18 ENCOUNTER — Ambulatory Visit (HOSPITAL_BASED_OUTPATIENT_CLINIC_OR_DEPARTMENT_OTHER): Payer: Medicaid Other | Admitting: Oncology

## 2011-08-18 ENCOUNTER — Other Ambulatory Visit (HOSPITAL_BASED_OUTPATIENT_CLINIC_OR_DEPARTMENT_OTHER): Payer: Medicare Other | Admitting: Lab

## 2011-08-18 ENCOUNTER — Encounter: Payer: Self-pay | Admitting: Oncology

## 2011-08-18 ENCOUNTER — Ambulatory Visit: Payer: Medicaid Other

## 2011-08-18 VITALS — BP 122/76 | HR 92 | Temp 97.8°F | Ht 65.5 in | Wt 127.4 lb

## 2011-08-18 DIAGNOSIS — C8190 Hodgkin lymphoma, unspecified, unspecified site: Secondary | ICD-10-CM

## 2011-08-18 LAB — CBC WITH DIFFERENTIAL/PLATELET
BASO%: 1.2 % (ref 0.0–2.0)
LYMPH%: 16.7 % (ref 14.0–49.0)
MCH: 32.6 pg (ref 27.2–33.4)
MCHC: 33.8 g/dL (ref 32.0–36.0)
MCV: 96.4 fL (ref 79.3–98.0)
MONO%: 10.3 % (ref 0.0–14.0)
Platelets: 212 10*3/uL (ref 140–400)
RBC: 3.84 10*6/uL — ABNORMAL LOW (ref 4.20–5.82)
nRBC: 0 % (ref 0–0)

## 2011-08-18 LAB — COMPREHENSIVE METABOLIC PANEL
ALT: 11 U/L (ref 0–53)
AST: 19 U/L (ref 0–37)
BUN: 11 mg/dL (ref 6–23)
Creatinine, Ser: 1.04 mg/dL (ref 0.50–1.35)
Total Bilirubin: 0.5 mg/dL (ref 0.3–1.2)

## 2011-08-18 LAB — LACTATE DEHYDROGENASE: LDH: 183 U/L (ref 94–250)

## 2011-08-18 NOTE — Progress Notes (Signed)
This office note has been dictated.  #[id number]

## 2011-08-18 NOTE — Progress Notes (Signed)
CC:   Keith Bell, M.D. Keith Ou, MD Keith Pert, MD Keith Bell, D.D.S.  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  Keith Bell had been receiving bendamustine, Rituxan and Neulasta since 11/16/2010.  We saw Keith Bell most recently on 07/18/2011, at which time he received his 9th cycle of treatment.  Keith Bell usually has some nausea and vomiting for which he takes Compazine and Zofran.  Symptoms are self-limited.  Keith Bell experienced some coughing and sore throat around December 11th.  He was not having any fever at that time.  We prescribed Levaquin for 7 days. He also took Hycodan.  Those symptoms have resolved.  Keith Bell's main problem at this point is some itching around his back and shoulders. This started about 3 weeks ago.  I have recommended that he buy over-the- counter Allegra 180 mg to take daily, or some other antihistamine, in the hopes that this will help.  Keith Bell underwent a PET scan on 08/02/2011.  Unfortunately, this shows that his disease has relapsed.  It will be recalled that a PET scan on 03/11/2011 showed a complete response to treatment.  Keith Bell's Medicaid has been off and on.  Most recently, we had thought that his Medicaid was in place, however, Keith Bell now is not sure of this.  Keith Bell denies any fever or night sweats.  He is currently eating well, although when he was sick several weeks ago he was not eating well and it is to that that he attributes his weight loss.  I have forwarded on to Dr. Lance Bell the PET scan and recent information by email and fax.  Dr. Lance Bell responded to me on December 26th and was not sure what treatment program to recommend for Keith Bell, since Keith Bell has had several agents previously.  Keith Bell tells me that he has an appointment to see Dr. Lance Bell at [facility] on January 8th.  I suggested  that he call to be sure that that appointment is still active.   PROBLEM LIST:   1. Hodgkin's lymphoma, diagnosed in March 2009, stage IV with positive  bone marrow on 11/21/2007. Keith Bell had a biopsy of a retroperitoneal  lymph node on 11/13/2007 to establish the diagnosis. At that time,  he had intra-abdominal disease and splenomegaly. He was having  night sweats and fevers so he was stage IVB. He received  Adriamycin, Velban, DTIC and Decadron approximately 4 and a half  cycles from 11/29/2007 through 04/03/2008 with only a partial  response. Bleomycin was omitted because of Keith Bell's underlying COPD.  He then received Rituxan with COPP for 6 cycles from 04/18/2008  through February 2010. He had an initial partial response and then  progressive disease. This was determined on 10/30/2008 by a PET-CT  scan. Keith Bell then received the RICE program for 6 cycles from  12/15/2008 through late August 2010 with an apparent complete  remission. He then had recurrence of disease noted and was treated  with Doxil, Navelbine and gemcitabine for 8 and a half cycles from  October 15, 2009 through April 21, 2010 with a partial response.  He then received brentuximab (Adcetris) from 05/25/2010 through  10/21/2010 with unfortunately progressive disease. He was then  switched to the current program of bendamustine, Rituxan and  Neulasta on 11/16/2010 and has been receiving that treatment about  every month and has completed 9 cycles.  A PET scan on  03/11/2011 showed a complete response. A bone marrow on 03/31/2011  was negative.  PET scan from 08/02/11 shows relapse.  2. Chronic obstructive pulmonary disease.  3. Peripheral vascular disease with claudication of right leg.  4. Recurrent hypokalemia.  5. History of hypertension.  6. Dyslipidemia.  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    CURRENT MEDICINES:  1. Vicodin once a day for right leg pain.  2. Zofran as needed.  3. K-Dur 20 mEq  twice daily   4. Compazine as needed.  PHYSICAL EXAMINATION:  Keith Bell looks thin and chronically ill.  He is in no acute distress.  His weight today is 127 pounds 6.4 ounces.  Height 5 feet 5-1/2 inches.  Body surface area 1.63 sq m.  Blood pressure 122/76. Other vital signs are normal.  He is afebrile.  On 07/18/2011, Keith Bell weighed 132.3 pounds.  Sclerae are markedly injected, but no scleral icterus.  Mouth and pharynx are benign.  Keith Bell is edentulous.  No adenopathy palpable.  No axillary or inguinal adenopathy.  Again, Keith Bell is cachectic.  There is a right-sided Port-A-Cath.  Lungs are clear. Cardiac:  Regular rhythm without murmur or rub.  There are some small papules over the upper back.  Keith Bell has been scratching himself there. No excoriations or ulcers.  Extremities:  No peripheral edema or clubbing.  Neurologic exam is grossly normal.  Keith Bell continues to have intermittent claudication in his right leg, for which he takes Vicodin daily.  LABORATORY DATA:  Today:  White count 8.5, ANC 5.4, hemoglobin 12.5 hematocrit 37.0, platelets 212,000.  Chemistries and sedimentation rate are pending.  Chemistries from 07/18/2011 notable for an alkaline phosphatase of 183; otherwise normal including an albumin of 4.1, LDH 210, BUN was 8, creatinine 0.79.  Sedimentation rate from 07/18/2011 was 32 as compared with 38 on 05/18/2011.  IMAGING DATA:  The PET scan carried out on 08/02/2011 showed the development of hypermetabolic nodes within the chest, abdomen and pelvis consistent with recurrent disease.  There is a probable low right cervical hypermetabolic node.  There is a hypermetabolic nodule at the right lung base measuring 7 mm with an SUV max of 3.6.  There are new hypermetabolic thoracic lymph nodes, retroperitoneal adenopathy which are small.  There is hypermetabolism in the right common iliac node. Again, the lymph nodes seem to be small.  It will be recalled that the PET scan from  03/11/2011 showed a complete response.  IMPRESSION AND PLAN:  Keith Bell's disease has once again recurred.  It has relapsed several times.  We are going to hold up on his planned chemotherapy for today.  I am hoping that Harrold will be able to see Dr. Lance Bell on January 8th.  I may be in contact with him.  I am not sure exactly where we go from here.  I was thinking of possibly a program that may utilize either cisplatin or carboplatin, ara-C and possibly Taxol or Taxotere.  Previous treatments are outlined above.   As stated, we will hold the chemotherapy schedule for today.  I have asked Brenon to return in about 2 weeks, at which time we will check CBC, chemistries and sedimentation rate.    ______________________________ [name], Keith Bell/MEDQ  D:  08/18/2011  T:  08/18/2011  Job:  [id number]

## 2011-08-18 NOTE — Progress Notes (Signed)
Spoke with the patient case worker today,and she said that the patient over qualify for medicaid because of his monthly income.

## 2011-08-19 ENCOUNTER — Ambulatory Visit: Payer: Medicaid Other

## 2011-08-19 ENCOUNTER — Telehealth: Payer: Self-pay | Admitting: Oncology

## 2011-08-19 ENCOUNTER — Telehealth: Payer: Self-pay | Admitting: Medical Oncology

## 2011-08-19 NOTE — Telephone Encounter (Signed)
S/w the pt and he is aware of his appts for 09/01/2011.

## 2011-08-19 NOTE — Telephone Encounter (Signed)
Keith Bell called from Dr. Murray Hodgkins office to let us know they are cancelling pt's appointment 08/23/11. Due to his progression of disease they do not see any benefit to see him. They just want to make sure Dr. Arline Asp is taking over his care. Dr. Arline Asp notified.

## 2011-08-20 ENCOUNTER — Ambulatory Visit: Payer: Medicaid Other

## 2011-09-01 ENCOUNTER — Encounter: Payer: Self-pay | Admitting: Oncology

## 2011-09-01 ENCOUNTER — Ambulatory Visit (HOSPITAL_BASED_OUTPATIENT_CLINIC_OR_DEPARTMENT_OTHER): Payer: Medicaid Other | Admitting: Oncology

## 2011-09-01 ENCOUNTER — Other Ambulatory Visit (HOSPITAL_BASED_OUTPATIENT_CLINIC_OR_DEPARTMENT_OTHER): Payer: Medicaid Other | Admitting: Lab

## 2011-09-01 ENCOUNTER — Ambulatory Visit: Payer: Self-pay

## 2011-09-01 ENCOUNTER — Other Ambulatory Visit: Payer: Self-pay | Admitting: Medical Oncology

## 2011-09-01 VITALS — BP 137/90 | HR 88 | Temp 97.0°F | Ht 65.5 in | Wt 128.1 lb

## 2011-09-01 DIAGNOSIS — C8190 Hodgkin lymphoma, unspecified, unspecified site: Secondary | ICD-10-CM

## 2011-09-01 LAB — CBC WITH DIFFERENTIAL/PLATELET
BASO%: 1 % (ref 0.0–2.0)
LYMPH%: 25.6 % (ref 14.0–49.0)
MCH: 32.4 pg (ref 27.2–33.4)
MCHC: 33.7 g/dL (ref 32.0–36.0)
MCV: 96 fL (ref 79.3–98.0)
MONO%: 11.6 % (ref 0.0–14.0)
Platelets: 208 10*3/uL (ref 140–400)
RBC: 3.74 10*6/uL — ABNORMAL LOW (ref 4.20–5.82)
nRBC: 0 % (ref 0–0)

## 2011-09-01 MED ORDER — PANTOPRAZOLE SODIUM 40 MG PO TBEC: 40.0000 mg | DELAYED_RELEASE_TABLET | Freq: Every day | ORAL | Status: AC

## 2011-09-01 NOTE — Progress Notes (Signed)
This office note has been dictated.  #[id number]

## 2011-09-01 NOTE — Progress Notes (Signed)
CC:   [name], M.D. [name], MD Pamella Pert, MD Cindra Keith Bell, D.D.S.  HISTORY:  Keith Bell was seen today for followup of his recurrent Hodgkin disease with diagnosis going back to March 2009, at which time Keith Bell presented with stage IVB disease.  He had bone marrow involvement at that time.  Keith Bell was last seen by [place] on 08/18/2011.  His last treatment with bendamustine, Rituxan and Neulasta was given on 07/18/2011.  Those treatments were started on 11/16/2010.  Keith Bell had a nice, complete response to that treatment initially with a PET scan on 03/11/2011.  Unfortunately, a PET scan on 08/02/2011 shows a multifocal relapse.  Keith Bell has been followed at [facility] by Dr. [name].  The patient is not a candidate for high-dose therapy and autologous stem cell rescue.  The appointment that Keith Bell had with Dr. Lance Bosch a couple of weeks ago was canceled.  Keith Bell is here today for further consideration of treatment.  In general, he seems to be doing well.  His major problem today is some difficulty swallowing, especially solids.  He is not having any dyspeptic symptoms.  Apparently his appetite is good.  He has had no fever or night sweats.  The pruritus that he was having previously his resolved with Allegra 180 mg daily.  Dyspnea on exertion remains but is stable without change.  Keith Bell also has intermittent claudication involving his right leg, and that too was stable.  All things considered, Keith Bell feels fairly well.   PROBLEM LIST:  1. Hodgkin's lymphoma, diagnosed in March 2009, stage IV with positive  bone marrow on 11/21/2007. Keith Bell had a biopsy of a retroperitoneal  lymph node on 11/13/2007 to establish the diagnosis. At that time,  he had intra-abdominal disease and splenomegaly. He was having  night sweats and fevers so he was stage IVB. He received  Adriamycin, Velban, DTIC and Decadron approximately 4 and a half  cycles from 11/29/2007 through 04/03/2008 with only a  partial  response. Bleomycin was omitted because of Keith Bell's underlying COPD.  He then received Rituxan with COPP for 6 cycles from 04/18/2008  through February 2010. He had an initial partial response and then  progressive disease. This was determined on 10/30/2008 by a PET-CT  scan. Keith Bell then received the RICE program for 6 cycles from  12/15/2008 through late August 2010 with an apparent complete  remission. He then had recurrence of disease noted and was treated  with Doxil, Navelbine and gemcitabine for 8 and a half cycles from  October 15, 2009 through April 21, 2010 with a partial response.  He then received brentuximab (Adcetris) from 05/25/2010 through  10/21/2010 with unfortunately progressive disease. He was then  switched to the current program of bendamustine, Rituxan and  Neulasta on 11/16/2010 and has received 9 cycles, with his most recent cycle given on 07/18/11. A PET scan on 03/11/2011 showed a  complete response. A bone marrow on 03/31/2011  was negative. PET scan from 08/02/11 shows relapse. Keith Bell has received  6 chemo regimens to date. 2. Chronic obstructive pulmonary disease.  3. Peripheral vascular disease with claudication of right leg.  4. Recurrent hypokalemia.  5. History of hypertension.  6. Dyslipidemia.  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   CURRENT MEDICINES:  1. Vicodin once a day for right leg pain.  2. Zofran as needed.  3. K-Dur 20 mEq twice daily  4. Compazine as needed. 5. Protonix 40 mg daily.   PHYSICAL EXAMINATION:  Keith Bell  is in no acute distress, looks thin and chronically ill.  Weight today is 128 pounds, height 5 feet 5-1/2 inches, body surface area 1.64 sq. m.  Blood pressure 137/90.  Other vital signs are normal.  O2 saturation on room air at rest was 100%. Physical exam will be extracted from the previous note.  We will delete the sentence about scratching, excoriations and ulcers.  LABORATORY DATA:  White count 6.1, ANC  3.3, hemoglobin 12.1, hematocrit 35.9, platelets 208,000.  Chemistries and sedimentation rate today are pending and will be drawn.  Chemistries from 08/18/2011 notable for an alkaline phosphatase of 181.  BUN was 11, creatinine 1.04.  Albumin 4.5. LDH 183.  Other liver function tests were normal.  Sedimentation rate was 34.  IMAGING STUDIES:  This will be extracted from the note of 08/18/2011.  IMPRESSION AND PLAN:  We have been looking into options for other treatment.  With the help of the pharmacy, we have found an abstract that reports on a phase 2 trial of the oral mTOR inhibitor everolimus (Afinitor) in relapsed Hodgkin lymphoma.  This abstract was published in the American Journal of Hematology May 2010, volume 85, number 5, pages 320 to 324.  In 19 patients that were enrolled with an age range of 66 to 42 and a history of extensive prior treatments with a median of 6 (range 3 to 14) and 84% of the patients having undergone a prior autologous stem cell transplant, the overall response rate was 47% with 8 patients achieving a partial response and 1 patient having a complete response.  THE MEDIAN TTP WAS 7.2 MONTHS.  Four responders remained progression-free at 12 months.  Unfortunately, 4 patients experienced grade 3 or higher pulmonary toxicity, and there was 1 death from infection.  Patients received everolimus 10 mg daily.  This may be an option for Keith Bell.  We are still looking at other possible options, which may include oral VP-16, BCNU, high-dose ara-C, cisplatin and possibly a taxane.  We will see if we can obtain everolimus.  Clearly, it is not an approved indication.  I have asked Keith Bell to return in 3 weeks, at which time we will check CBC, chemistries and sedimentation rate.  He will need to have his port flushed with heparin at that time.    ______________________________ [name], M.D. DSM/MEDQ  D:  09/01/2011  T:  09/01/2011  Job:  [id number]

## 2011-09-02 ENCOUNTER — Telehealth: Payer: Self-pay | Admitting: Medical Oncology

## 2011-09-02 LAB — COMPREHENSIVE METABOLIC PANEL
AST: 13 U/L (ref 0–37)
Alkaline Phosphatase: 188 U/L — ABNORMAL HIGH (ref 39–117)
BUN: 8 mg/dL (ref 6–23)
Calcium: 9.6 mg/dL (ref 8.4–10.5)
Creatinine, Ser: 1 mg/dL (ref 0.50–1.35)

## 2011-09-02 NOTE — Telephone Encounter (Signed)
I called pt to let him know that we got some samples of afinitor. Dr. Arline Asp would like to get him started as soon as possible. I asked him to come by Monday am to pick up so he can get started. He voiced understanding.  We have faxed the prescription to [facility].

## 2011-09-06 ENCOUNTER — Encounter: Payer: Self-pay | Admitting: Oncology

## 2011-09-06 NOTE — Progress Notes (Signed)
Called Mr. Pelzer about patient assistance for Afinitor and proof of income, lmovm.  After filling out the application and reading MD's office note this drug is not indicated for patient's diagnosis, therefore the drug company will not offer him free medication; left message on nurse's voicemail.

## 2011-09-08 ENCOUNTER — Telehealth: Payer: Self-pay | Admitting: Oncology

## 2011-09-08 NOTE — Telephone Encounter (Signed)
l/m with 2/5 appt info and mailed     aom

## 2011-09-14 ENCOUNTER — Other Ambulatory Visit: Payer: Self-pay | Admitting: Medical Oncology

## 2011-09-14 DIAGNOSIS — C8190 Hodgkin lymphoma, unspecified, unspecified site: Secondary | ICD-10-CM

## 2011-09-14 MED ORDER — HYDROCODONE-ACETAMINOPHEN 5-500 MG PO TABS: 1.0000 | ORAL_TABLET | Freq: Four times a day (QID) | ORAL | Status: DC | PRN

## 2011-09-15 ENCOUNTER — Other Ambulatory Visit: Payer: Self-pay | Admitting: Oncology

## 2011-09-15 ENCOUNTER — Telehealth: Payer: Self-pay | Admitting: Oncology

## 2011-09-15 NOTE — Telephone Encounter (Signed)
Received fax from Sharl Ma on [employer] regarding refill request for Norco 10-325 #60 1/2 - 1 tab q4h prn.  Per EPIC, Vicodin 5-500 was refilled yesterday by Zella Ball, RN.  Called and spoke with pharmacist, who states Vicodin was received, and pt had called yesterday to request refill on Norco 10-325.  Pharmacy has record of pt receiving Vicodin 5-500 before in March/2012.  Called and spoke with pt to ask what strength of pain medication he is taking.  Pt states he is using 5-500 which controls his pain.  Informed him this has been filled, and other medication request for Norco 10-325 will be denied.  He verbalizes understanding.  Called [facility] pharmacy back and informed them to deny request for Norco 10-325.

## 2011-09-15 NOTE — Telephone Encounter (Signed)
Returned call to patient, patient wanted to know how to get copies of bills I gave him the number for the billing office.

## 2011-09-15 NOTE — Telephone Encounter (Signed)
Returned a phone call to patient, patient was inquiring about getting copies of bills, I gave Mr. Keith Bell the phone number to billing to have copies of bills mailed to him.

## 2011-09-20 ENCOUNTER — Ambulatory Visit (HOSPITAL_BASED_OUTPATIENT_CLINIC_OR_DEPARTMENT_OTHER): Payer: Medicaid Other | Admitting: Physician Assistant

## 2011-09-20 ENCOUNTER — Ambulatory Visit (HOSPITAL_BASED_OUTPATIENT_CLINIC_OR_DEPARTMENT_OTHER): Payer: Self-pay

## 2011-09-20 ENCOUNTER — Other Ambulatory Visit: Payer: Self-pay

## 2011-09-20 ENCOUNTER — Telehealth: Payer: Self-pay | Admitting: Oncology

## 2011-09-20 VITALS — BP 138/99 | HR 86 | Temp 97.3°F | Ht 65.5 in | Wt 125.9 lb

## 2011-09-20 DIAGNOSIS — Z469 Encounter for fitting and adjustment of unspecified device: Secondary | ICD-10-CM

## 2011-09-20 DIAGNOSIS — C8193 Hodgkin lymphoma, unspecified, intra-abdominal lymph nodes: Secondary | ICD-10-CM

## 2011-09-20 DIAGNOSIS — D630 Anemia in neoplastic disease: Secondary | ICD-10-CM

## 2011-09-20 DIAGNOSIS — C8190 Hodgkin lymphoma, unspecified, unspecified site: Secondary | ICD-10-CM

## 2011-09-20 LAB — CBC WITH DIFFERENTIAL/PLATELET
EOS%: 14.1 % — ABNORMAL HIGH (ref 0.0–7.0)
Eosinophils Absolute: 0.3 10*3/uL (ref 0.0–0.5)
MCHC: 34 g/dL (ref 32.0–36.0)
MONO#: 0.2 10*3/uL (ref 0.1–0.9)
MONO%: 12 % (ref 0.0–14.0)
NEUT#: 0.5 10*3/uL — ABNORMAL LOW (ref 1.5–6.5)
Platelets: 104 10*3/uL — ABNORMAL LOW (ref 140–400)
RBC: 3.79 10*6/uL — ABNORMAL LOW (ref 4.20–5.82)
WBC: 1.9 10*3/uL — ABNORMAL LOW (ref 4.0–10.3)
lymph#: 0.9 10*3/uL (ref 0.9–3.3)

## 2011-09-20 MED ORDER — HEPARIN SOD (PORK) LOCK FLUSH 100 UNIT/ML IV SOLN
500.0000 [IU] | Freq: Once | INTRAVENOUS | Status: AC
  Administered 2011-09-20: 500 [IU] via INTRAVENOUS
  Filled 2011-09-20: qty 5

## 2011-09-20 MED ORDER — SODIUM CHLORIDE 0.9 % IJ SOLN
10.0000 mL | INTRAMUSCULAR | Status: DC | PRN
  Administered 2011-09-20: 10 mL via INTRAVENOUS
  Filled 2011-09-20: qty 10

## 2011-09-20 NOTE — Progress Notes (Signed)
This office note has been dictated.

## 2011-09-20 NOTE — Telephone Encounter (Signed)
called pt and informed her of appt for [id number]

## 2011-09-21 LAB — COMPREHENSIVE METABOLIC PANEL
AST: 26 U/L (ref 0–37)
Alkaline Phosphatase: 211 U/L — ABNORMAL HIGH (ref 39–117)
BUN: 11 mg/dL (ref 6–23)
Calcium: 9.3 mg/dL (ref 8.4–10.5)
Creatinine, Ser: 1.09 mg/dL (ref 0.50–1.35)
Glucose, Bld: 118 mg/dL — ABNORMAL HIGH (ref 70–99)

## 2011-09-21 LAB — SEDIMENTATION RATE: Sed Rate: 45 mm/hr — ABNORMAL HIGH (ref 0–16)

## 2011-09-21 NOTE — Progress Notes (Signed)
CC:   [name], M.D. [name], MD Pamella Pert, MD Cindra Eves, D.D.S.  INTERIM HISTORY:  Mr. Demtrius Rounds returns to the clinic for followup of his recurrent Hodgkin disease.  Since his last clinic visit on September 01, 2011 he began treatment with Afinitor 10 mg p.o. daily on September 06, 2011.  He states he has had some mild fatigue but no difficulty completing ADLs.  No fevers, chills, or night sweats.  He does report intermittent cough but states this has been chronic as he does continue to smoke 1 pack of cigarettes per day.  He does again reiterate that he is not interested in quitting at this time.  He has had no significant anorexia.  No nausea, vomiting, constipation, or diarrhea.  No abdominal pain or cramping or rectal bleeding.  No dysuria, frequency, or hematuria.  No alteration in sensation or balance or swelling of extremities.  He does report some intermittent calf pain in his legs, right side greater than the left and states this also has been a chronic problem.  He is not experiencing any discomfort today.  CURRENT MEDICATIONS:  Reviewed and recorded.  PHYSICAL EXAM:  Vital signs:  Temperature today 96.9, heart rate 83, respirations 20, blood pressure 128/90, weight 125.9 pounds.  General: This is a thin, black male in no acute distress.  HEENT:  Sclerae are slightly jaundiced.  There is no oral thrush or mucositis.  Skin: Without rashes or lesions.  Lymph:  No cervical, supraclavicular, axillary, or inguinal lymphadenopathy.  Cardiac:  Reveals regular rate and rhythm without murmurs or gallops.  Peripheral pulses are 2+.  He does have a right subclavian Port-A-Cath without signs of infection. Chest:  Lungs are clear to auscultation.  Abdomen:  Positive bowel sounds.  Soft, nontender, nondistended.  No organomegaly.  Extremities: Without edema, cyanosis or calf tenderness.  Neuro:  Alert and oriented x3.  Strength, sensation, and coordination  all grossly intact.  LABS:  CBC with diff reveals white blood count of 1.9.  Hemoglobin of 12.1, hematocrit 35.6, platelets of 104, ANC of 0.5 and MCV of 93.9. CMET, LDH and sedimentation rate are currently pending.  IMPRESSION/PLAN:  Mr. Delmar Arriaga is a 61 year old black male with recurrent Hodgkin's disease originally diagnosed in March of 2009 with stage IVB with bone marrow involvement.  He received Doxil Navelbine and gemcitabine through September 2011 with Neulasta support.  He then began treatment with brentuximab on May 25, 2010, receiving treatment every 3 weeks as he was awaiting consideration for high-dose therapy and stem cell transplant.  During this process due to issues with insurance he continued to receive rituximab through March of 2012 when PET scan reveal recurrent and progressive disease.  He was deemed not a candidate for transplant and began treatment with Treanda on November 16, 2010 with Neulasta support.  He proceeded to further treatment with Treanda and Rituxan until followup scan in July of 2012 which revealed complete remission.  Followup bone marrow biopsy and aspirate from March 31, 2011 was hypocellular but no evidence for involvement of the bone marrow by lymphoma.  The patient was then referred back to [facility] for consideration of transplant and at that time, still had issues with his Medicaid.  He continued with Rituxan and Treanda and ultimately was last treated on July 18, 2011.  Followup PET scan from August 02, 2011 revealed development of hypermetabolic nodes within the chest, abdomen and pelvis consistent with residual/recurrent disease, probable low right cervical hypermetabolic  node, right lung base hypermetabolic nodule, favor of lymphomatous involvement versus less likely infection/inflammation given centrilobular emphysema.  A primary bronchogenic carcinoma is possible but felt less likely.  This could be re-evaluated on followup  The  patient most recently started therapy with Afinitor for his relapsed Hodgkin lymphoma, starting 10 mg p.o. daily.  On September 06, 2011. Current labs do reveal some myelosuppression and per Dr. Arline Asp we will hold Afinitor and will assess weekly labs.  Based on lab results the patient will be advised when to resume his Afinitor.  He will also be scheduled for followup visit with Dr. Arline Asp in 4 weeks time at which time we will reassess CBC with diff, CMET and LDH.  The patient is advised to call in the interim with any questions or problems.  Also of note, the patient did have slightly jaundiced sclerae today and we will add a CMET to his weekly labs as well.  The patient will be called with results.    ______________________________ Michail Sermon, MSN, ANP, BC RH/MEDQ  D:  09/20/2011  T:  09/20/2011  Job:  [id number]

## 2011-10-04 ENCOUNTER — Other Ambulatory Visit: Payer: Self-pay | Admitting: Lab

## 2011-10-19 ENCOUNTER — Telehealth: Payer: Self-pay | Admitting: Oncology

## 2011-10-19 ENCOUNTER — Other Ambulatory Visit (HOSPITAL_BASED_OUTPATIENT_CLINIC_OR_DEPARTMENT_OTHER): Payer: Medicare Other | Admitting: Lab

## 2011-10-19 ENCOUNTER — Ambulatory Visit (HOSPITAL_BASED_OUTPATIENT_CLINIC_OR_DEPARTMENT_OTHER): Payer: Self-pay | Admitting: Oncology

## 2011-10-19 ENCOUNTER — Encounter: Payer: Self-pay | Admitting: Oncology

## 2011-10-19 VITALS — BP 126/87 | HR 80 | Temp 97.0°F | Ht 65.5 in | Wt 128.8 lb

## 2011-10-19 DIAGNOSIS — C8190 Hodgkin lymphoma, unspecified, unspecified site: Secondary | ICD-10-CM

## 2011-10-19 LAB — CBC WITH DIFFERENTIAL/PLATELET
BASO%: 0.8 % (ref 0.0–2.0)
LYMPH%: 33.3 % (ref 14.0–49.0)
MCH: 32 pg (ref 27.2–33.4)
MCHC: 32.8 g/dL (ref 32.0–36.0)
MCV: 97.5 fL (ref 79.3–98.0)
MONO%: 14.3 % — ABNORMAL HIGH (ref 0.0–14.0)
Platelets: 286 10*3/uL (ref 140–400)
RBC: 3.37 10*6/uL — ABNORMAL LOW (ref 4.20–5.82)

## 2011-10-19 LAB — COMPREHENSIVE METABOLIC PANEL
Alkaline Phosphatase: 184 U/L — ABNORMAL HIGH (ref 39–117)
Glucose, Bld: 106 mg/dL — ABNORMAL HIGH (ref 70–99)
Sodium: 144 mEq/L (ref 135–145)
Total Bilirubin: 0.7 mg/dL (ref 0.3–1.2)
Total Protein: 6.1 g/dL (ref 6.0–8.3)

## 2011-10-19 NOTE — Progress Notes (Signed)
CC:   [name], M.D. [name], MD   PROBLEM LIST:  1. Hodgkin's lymphoma, diagnosed in March 2009, stage IV with positive  bone marrow on 11/21/2007. Jahzier had a biopsy of a retroperitoneal  lymph node on 11/13/2007 to establish the diagnosis. At that time,  he had intra-abdominal disease and splenomegaly. He was having  night sweats and fevers so he was stage IVB. He received  Adriamycin, Velban, DTIC and Decadron approximately 4 and a half  cycles from 11/29/2007 through 04/03/2008 with only a partial  response. Bleomycin was omitted because of Devario's underlying COPD.  He then received Rituxan with COPP for 6 cycles from 04/18/2008  through February 2010. He had an initial partial response and then  progressive disease. This was determined on 10/30/2008 by a PET-CT  scan. Pauline then received the RICE program for 6 cycles from  12/15/2008 through late August 2010 with an apparent complete  remission. He then had recurrence of disease noted and was treated  with Doxil, Navelbine and gemcitabine for 8 and a half cycles from  October 15, 2009 through April 21, 2010 with a partial response.  He then received brentuximab (Adcetris) from 05/25/2010 through  10/21/2010 with unfortunately progressive disease. He was then  switched to the current program of bendamustine, Rituxan and  Neulasta on 11/16/2010 and has received 9 cycles, with his most recent  cycle given on 07/18/11. A PET scan on 03/11/2011 showed a  complete response. A bone marrow on 03/31/2011  was negative. PET scan from 08/02/11 shows relapse. Marqual has received  6 chemo regimens to date.  2. Chronic obstructive pulmonary disease.  3. Peripheral vascular disease with claudication of right leg.  4. Recurrent hypokalemia.  5. History of hypertension.  6. Dyslipidemia.  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   CURRENT MEDICINES:  1. Vicodin once a day for right leg pain.  2. Zofran as needed.  3.  K-Dur 20 mEq twice daily  4. Compazine as needed.  5. Protonix 40 mg daily 6. Afinitor 10 mg daily was started on September 06, 2011.    HISTORY:  Rangel Echeverri is a 61 year old man followed by [place] since he was diagnosed with stage IVB Hodgkin lymphoma in March 2009.  Askari was last seen by [place] on 09/20/2011.  Sloan had been started on Afinitor 10 mg daily around January 22nd.  He was seen by [place] 2 weeks later on February 5th at which time his white count had dropped to 1.9 with an ANC of 0.5 and a platelet count of a 104,000.  At that point, Afinitor was discontinued. Kylar was supposed to come in every week and have blood counts and then we were going to start up the Afinitor when his blood counts showed some recovery.  Apparently CBCs were not either scheduled or drawn.  Julia is seen here today for scheduled followup.  He has not been on Afinitor for since it was discontinued on February 5th.  He had been taking Afinitor for about 2 weeks.  Montrey is really without complaints today.  He says he feels good except for ongoing claudication pain in his right leg for which he takes Vicodin once a day.  He has dyspnea on exertion.  Aside from this, he feels well without any other types of pain, night sweats, fever.  His energy is good.  His appetite is okay.  His weight is more or less stable.  He is really without complaints today.  PHYSICAL EXAM:  Jaylon looks ill as he has before.  His weight today is 128.8 pounds up from 125.9 pounds on 09/20/2011.  Height is 5 feet 5-1/2 inches, body surface area 1.64 meters squared.  Blood pressure 126/87. Other vital signs are normal.  HEENT:  There is no apparent scleral icterus although we thought we had noted some scleral icterus in the past.  Mouth and pharynx benign.  No peripheral adenopathy palpable in any of the lymph node groups.  There is a right-sided Port-A-Cath.  This was last flushed with heparin apparently on February 5th.  Heart and lungs:   Normal.  Abdomen:  Benign with no organomegaly or masses palpable.  No tenderness.  Extremities:  No peripheral edema or clubbing.  Neurologic:  Exam is grossly normal.  LABORATORY DATA:  Today, white count 3.7, ANC 1.4, hemoglobin 10.8, hematocrit 32.9, platelets 286,000.  As stated, on 02/05 the white count was 1.9, ANC 0.5, hemoglobin 12.1, hematocrit 35.6, and platelets were 104,000.  Chemistries today are pending.  Chemistries from 09/20/2011 were essentially normal except for a glucose of 118 and alkaline phosphatase of 211.  Albumin was 4.1, LDH 191.  Sedimentation rate was 45 as compared with 32 on 117 and 34 on 08/18/2011.  Chemistries and sedimentation rate are pending today.  IMAGING DATA: The PET scan carried out on 08/02/2011 showed the  development of hypermetabolic nodes within the chest, abdomen and pelvis  consistent with recurrent disease. There is a probable low right  cervical hypermetabolic node. There is a hypermetabolic nodule at the  right lung base measuring 7 mm with an SUV max of 3.6. There are new  hypermetabolic thoracic lymph nodes, retroperitoneal adenopathy which  are small. There is hypermetabolism in the right common iliac node.  Again, the lymph nodes seem to be small. It will be recalled that the  PET scan from 03/11/2011 showed a complete response.   IMPRESSION AND PLAN:  Clinically, Areon seems to be doing well despite the extensive nature of his recurrent Hodgkin lymphoma.  Deborah has not been on Afinitor for the past month.  We are going to restart the Afinitor at 10 mg daily and check weekly CBCs.  We may need to have Orvin take 10 mg every other day if his blood counts drop again.  We may have to stop the Afinitor again after a week or 2.  We will be checking weekly CBCs.  We will plan to see Lessie again in approximately 5 weeks which will be April 10th at which time we will check CBC, chemistries, sedimentation rate, triglycerides and lipid panel.   Abdulkarim will need his Port-A-Cath flushed at that time.  I have also asked him if he would bring in his medicines with him.    ______________________________ [name], M.D. DSM/MEDQ  D:  10/19/2011  T:  10/19/2011  Job:  [id number]

## 2011-10-19 NOTE — Progress Notes (Signed)
This office note has been dictated.  #[id number]

## 2011-10-19 NOTE — Telephone Encounter (Signed)
Gv pt appt for march-april2013 

## 2011-10-20 ENCOUNTER — Telehealth: Payer: Self-pay | Admitting: Medical Oncology

## 2011-10-20 NOTE — Telephone Encounter (Signed)
I called pt and left him a message that I have samples of Afinitor for him. I asked him to call me to let me know when he can pick these up.

## 2011-10-26 ENCOUNTER — Other Ambulatory Visit: Payer: Self-pay | Admitting: Medical Oncology

## 2011-10-26 ENCOUNTER — Other Ambulatory Visit (HOSPITAL_BASED_OUTPATIENT_CLINIC_OR_DEPARTMENT_OTHER): Payer: Self-pay | Admitting: Lab

## 2011-10-26 DIAGNOSIS — C8190 Hodgkin lymphoma, unspecified, unspecified site: Secondary | ICD-10-CM

## 2011-10-26 LAB — CBC WITH DIFFERENTIAL/PLATELET
Eosinophils Absolute: 0.4 10*3/uL (ref 0.0–0.5)
LYMPH%: 31.7 % (ref 14.0–49.0)
MCHC: 32.9 g/dL (ref 32.0–36.0)
MCV: 94.8 fL (ref 79.3–98.0)
MONO%: 24.7 % — ABNORMAL HIGH (ref 0.0–14.0)
NEUT#: 1.2 10*3/uL — ABNORMAL LOW (ref 1.5–6.5)
Platelets: 195 10*3/uL (ref 140–400)
RBC: 3.46 10*6/uL — ABNORMAL LOW (ref 4.20–5.82)

## 2011-11-02 ENCOUNTER — Encounter: Payer: Self-pay | Admitting: Oncology

## 2011-11-02 ENCOUNTER — Telehealth: Payer: Self-pay

## 2011-11-02 ENCOUNTER — Other Ambulatory Visit (HOSPITAL_BASED_OUTPATIENT_CLINIC_OR_DEPARTMENT_OTHER): Payer: Self-pay | Admitting: Lab

## 2011-11-02 DIAGNOSIS — C8190 Hodgkin lymphoma, unspecified, unspecified site: Secondary | ICD-10-CM

## 2011-11-02 LAB — CBC WITH DIFFERENTIAL/PLATELET
BASO%: 1.8 % (ref 0.0–2.0)
EOS%: 10.8 % — ABNORMAL HIGH (ref 0.0–7.0)
LYMPH%: 44.6 % (ref 14.0–49.0)
MCH: 30.4 pg (ref 27.2–33.4)
MCHC: 32.7 g/dL (ref 32.0–36.0)
MONO#: 0.4 10*3/uL (ref 0.1–0.9)
MONO%: 14.4 % — ABNORMAL HIGH (ref 0.0–14.0)
Platelets: 153 10*3/uL (ref 140–400)
RBC: 3.68 10*6/uL — ABNORMAL LOW (ref 4.20–5.82)
WBC: 2.8 10*3/uL — ABNORMAL LOW (ref 4.0–10.3)
nRBC: 0 % (ref 0–0)

## 2011-11-02 NOTE — Progress Notes (Signed)
CBC today:  2.8, 0.8, 11.2, 34.2, 153k.  Patient taking Afinitor 10 mg daily.  Patient instructed to stop Afinitor.  We will check CBC weekly.  Next appointment is on 11/23/11.

## 2011-11-02 NOTE — Telephone Encounter (Signed)
S/w pt that Dr Arline Asp wants him to stop taking his afinitor. Also to continue his weekly lab checks. Pt expressed understanding

## 2011-11-09 ENCOUNTER — Other Ambulatory Visit (HOSPITAL_BASED_OUTPATIENT_CLINIC_OR_DEPARTMENT_OTHER): Payer: Self-pay | Admitting: Lab

## 2011-11-09 ENCOUNTER — Telehealth: Payer: Self-pay | Admitting: Medical Oncology

## 2011-11-09 DIAGNOSIS — C8190 Hodgkin lymphoma, unspecified, unspecified site: Secondary | ICD-10-CM

## 2011-11-09 LAB — CBC WITH DIFFERENTIAL/PLATELET
BASO%: 1 % (ref 0.0–2.0)
EOS%: 9.2 % — ABNORMAL HIGH (ref 0.0–7.0)
HGB: 11 g/dL — ABNORMAL LOW (ref 13.0–17.1)
MCH: 30 pg (ref 27.2–33.4)
MCHC: 32.8 g/dL (ref 32.0–36.0)
MONO%: 18.4 % — ABNORMAL HIGH (ref 0.0–14.0)
RBC: 3.67 10*6/uL — ABNORMAL LOW (ref 4.20–5.82)
RDW: 17.6 % — ABNORMAL HIGH (ref 11.0–14.6)
lymph#: 1.4 10*3/uL (ref 0.9–3.3)
nRBC: 0 % (ref 0–0)

## 2011-11-09 NOTE — Telephone Encounter (Signed)
Dr. Arline Asp reviewed lab results today.   Spoke with pt and instructed pt to continue to HOLD  Affinitor  As per md.   Reinforced neutropenic precautions with pt.   Confirmed next lab appt for  11/16/11.   Pt voiced understanding.

## 2011-11-16 ENCOUNTER — Encounter: Payer: Self-pay | Admitting: Oncology

## 2011-11-16 ENCOUNTER — Other Ambulatory Visit (HOSPITAL_BASED_OUTPATIENT_CLINIC_OR_DEPARTMENT_OTHER): Payer: Self-pay | Admitting: Lab

## 2011-11-16 ENCOUNTER — Telehealth: Payer: Self-pay

## 2011-11-16 DIAGNOSIS — C8190 Hodgkin lymphoma, unspecified, unspecified site: Secondary | ICD-10-CM

## 2011-11-16 LAB — CBC WITH DIFFERENTIAL/PLATELET
BASO%: 1.3 % (ref 0.0–2.0)
Basophils Absolute: 0.1 10*3/uL (ref 0.0–0.1)
Eosinophils Absolute: 0.2 10*3/uL (ref 0.0–0.5)
MCHC: 32.3 g/dL (ref 32.0–36.0)
MCV: 91.5 fL (ref 79.3–98.0)
MONO#: 1 10*3/uL — ABNORMAL HIGH (ref 0.1–0.9)
MONO%: 20.4 % — ABNORMAL HIGH (ref 0.0–14.0)
NEUT#: 2.1 10*3/uL (ref 1.5–6.5)
RBC: 3.18 10*6/uL — ABNORMAL LOW (ref 4.20–5.82)
RDW: 19.2 % — ABNORMAL HIGH (ref 11.0–14.6)
WBC: 4.7 10*3/uL (ref 4.0–10.3)

## 2011-11-16 NOTE — Telephone Encounter (Signed)
s/w pt that Dr Arline Asp wants him to take affinitor 10 mg every other day. And his f/u appt is 4/10. Pt expressed understanding.

## 2011-11-16 NOTE — Progress Notes (Signed)
Afinitor has been on hold since 3/20 due to an ANC of 0.8.  Today ANC was 2.1.  Afinitor will be restarted at 10 mg every other day.  It was a daily dose.  Next appointment is 4/10.

## 2011-11-16 NOTE — Progress Notes (Signed)
Talked with Keith Bell today because his Medicaid is inactive. [name] [phone] [facility] Caseworker) to check his Medicaid status.  No answer, Left message to call back. Advised [name] once I hear from his caseworker I will follow up with him.

## 2011-11-23 ENCOUNTER — Telehealth: Payer: Self-pay | Admitting: Oncology

## 2011-11-23 ENCOUNTER — Encounter: Payer: Self-pay | Admitting: Oncology

## 2011-11-23 ENCOUNTER — Ambulatory Visit (HOSPITAL_BASED_OUTPATIENT_CLINIC_OR_DEPARTMENT_OTHER): Payer: Self-pay | Admitting: Oncology

## 2011-11-23 ENCOUNTER — Other Ambulatory Visit (HOSPITAL_BASED_OUTPATIENT_CLINIC_OR_DEPARTMENT_OTHER): Payer: Medicare Other

## 2011-11-23 VITALS — BP 116/78 | HR 85 | Temp 97.3°F | Ht 65.5 in | Wt 129.6 lb

## 2011-11-23 DIAGNOSIS — C8190 Hodgkin lymphoma, unspecified, unspecified site: Secondary | ICD-10-CM

## 2011-11-23 DIAGNOSIS — D630 Anemia in neoplastic disease: Secondary | ICD-10-CM

## 2011-11-23 LAB — SEDIMENTATION RATE: Sed Rate: 58 mm/hr — ABNORMAL HIGH (ref 0–16)

## 2011-11-23 LAB — LIPID PANEL
Cholesterol: 187 mg/dL (ref 0–200)
HDL: 32 mg/dL — ABNORMAL LOW (ref >39)
LDL Cholesterol: 126 mg/dL — ABNORMAL HIGH (ref 0–99)
Triglycerides: 146 mg/dL (ref <150)

## 2011-11-23 LAB — CBC WITH DIFFERENTIAL/PLATELET
BASO%: 1 % (ref 0.0–2.0)
Eosinophils Absolute: 0.3 10*3/uL (ref 0.0–0.5)
HCT: 30.2 % — ABNORMAL LOW (ref 38.4–49.9)
LYMPH%: 22.5 % (ref 14.0–49.0)
MONO#: 1 10*3/uL — ABNORMAL HIGH (ref 0.1–0.9)
NEUT#: 2.4 10*3/uL (ref 1.5–6.5)
Platelets: 283 10*3/uL (ref 140–400)
RBC: 3.18 10*6/uL — ABNORMAL LOW (ref 4.20–5.82)
WBC: 4.8 10*3/uL (ref 4.0–10.3)
lymph#: 1.1 10*3/uL (ref 0.9–3.3)
nRBC: 0 % (ref 0–0)

## 2011-11-23 LAB — COMPREHENSIVE METABOLIC PANEL
ALT: <8 U/L (ref 0–53)
CO2: 26 mEq/L (ref 19–32)
Creatinine, Ser: 0.96 mg/dL (ref 0.50–1.35)
Total Bilirubin: 0.5 mg/dL (ref 0.3–1.2)

## 2011-11-23 LAB — LACTATE DEHYDROGENASE: LDH: 144 U/L (ref 94–250)

## 2011-11-23 MED ORDER — SODIUM CHLORIDE 0.9 % IJ SOLN
10.0000 mL | Freq: Once | INTRAMUSCULAR | Status: AC
  Administered 2011-11-23: 10 mL via INTRAVENOUS
  Filled 2011-11-23: qty 10

## 2011-11-23 MED ORDER — HEPARIN SOD (PORK) LOCK FLUSH 100 UNIT/ML IV SOLN
500.0000 [IU] | Freq: Once | INTRAVENOUS | Status: AC
  Administered 2011-11-23: 500 [IU] via INTRAVENOUS
  Filled 2011-11-23: qty 5

## 2011-11-23 NOTE — Progress Notes (Signed)
This office note has been dictated.  #[id number]

## 2011-11-23 NOTE — Progress Notes (Signed)
CC:   [name], M.D. [name], MD   PROBLEM LIST:  1. Hodgkin's lymphoma, diagnosed in March 2009, stage IV with positive  bone marrow on 11/21/2007. Keith Bell had a biopsy of a retroperitoneal  lymph node on 11/13/2007 to establish the diagnosis. At that time,  he had intra-abdominal disease and splenomegaly. He was having  night sweats and fevers so he was stage IVB. He received  Adriamycin, Velban, DTIC and Decadron approximately 4 and a half  cycles from 11/29/2007 through 04/03/2008 with only a partial  response. Bleomycin was omitted because of Keith Bell's underlying COPD.  He then received Rituxan with COPP for 6 cycles from 04/18/2008  through February 2010. He had an initial partial response and then  progressive disease. This was determined on 10/30/2008 by a PET-CT  scan. Keith Bell then received the RICE program for 6 cycles from  12/15/2008 through late August 2010 with an apparent complete  remission. He then had recurrence of disease noted and was treated  with Doxil, Navelbine and gemcitabine for 8 and a half cycles from  October 15, 2009 through April 21, 2010 with a partial response.  He then received brentuximab (Adcetris) from 05/25/2010 through  10/21/2010 with unfortunately progressive disease.   He was then switched to the program of bendamustine, Rituxan and Neulasta on 11/16/2010 and received 9 cycles. Most recent course with that program was given on 07/18/2011.  A PET scan on 03/11/2011 showed a complete response.  A bone marrow on 03/31/2011 was negative.  Unfortunately on 08/02/2011 PET scan showed relapse.  At that point, Keith Bell had received a total of 6 different chemotherapy regimens. Keith Bell was started on Afinitor 10 mg daily on September 06, 2011. Approximately 2 weeks later on February 5th, the white count had dropped to 1.9 with an ANC of 0.5 and a platelet count of a 104,000.  Afinitor was discontinued.  Afinitor was restarted on 10/19/2011,  however  by 03/20 the ANC was 0.8, and we once again held Afinitor.  On April 3rd Afinitor was started at 10 mg every other day, and so far, Keith Bell is tolerating this well. 2. Chronic obstructive pulmonary disease.  3. Peripheral vascular disease with claudication of right leg.  4. Recurrent hypokalemia.  5. History of hypertension.  6. Dyslipidemia.  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    CURRENT MEDICINES:  1. Vicodin once a day for right leg pain.  2. Zofran as needed.  3. K-Dur 20 mEq twice daily  4. Compazine as needed.  5. Protonix 40 mg daily  6. Current Afinitor dose is now 10 mg every other day as of April 3rd.     It will be recalled that Afinitor was started on September 06, 2011,     but needed to be stopped after approximately 2 weeks of treatment     around February 5th.  Afinitor was restarted at 10 mg daily around     March 20th, but then again need to be stopped because of     myelosuppression.  Afinitor was restarted at 10 mg every other day     on April 3rd.   HISTORY:  I saw Keith Bell today for followup of his relapsed stage IVB Hodgkin lymphoma which was diagnosed in March 2009.  He had stage IVB disease at that time.  Keith Bell has received multiple treatments.  At 1 point, he was being considered for high-dose therapy, but we have never been able to maintain his remission long  enough to go ahead with transplant.  As noted above, Keith Bell is on Afinitor 10 mg every other day which was started about a week ago.  Keith Bell denies any new problems.  He is tolerating the Afinitor well.  His only complaint is some ongoing claudication pain in his right leg for which he takes Vicodin once a day.  He has dyspnea on exertion, again nothing new.  No fever, chills, night sweats, any neuropathy, skin problems, diarrhea.  Appetite is okay.  PHYSICAL EXAM:  There is little change.  Keith Bell looks chronically ill, somewhat cachectic.  His weight is more or less stable at 129.6  pounds, height 5 feet 5-1/2 inches, body surface area 1.65 sq/m.  Blood pressure 116/78.  Other vital signs are normal.  Sclerae are muddy without icterus.  Mouth and pharynx benign.  No adenopathy in any of the lymph node groups.  There is a right-sided Port-A-Cath that will be flushed with heparin today.  This was last flushed on February 5th.  Lungs:  A few rhonchi which clear mostly with cough.  [name] continues to smoke about a half pack cigarettes a day.  Cardiac:  Regular rhythm without murmur or rub.  Abdomen:  Benign with no organomegaly or masses palpable.  Extremities:  No peripheral edema or clubbing.  No evidence of hand-foot syndrome.  Neurologic:  Nonfocal.  LABORATORY DATA:  Today, white count 4.8, ANC 2.4, hemoglobin 10.1, hematocrit 30.2, platelets 283,000.  Chemistries, sedimentation rate, lipid profile, and triglycerides are pending.  I do not think that Keith Bell was fasting for these studies.  Chemistries from 10/19/2011 notable for glucose of 106, alkaline phosphatase 184.  LDH was 191 on 09/20/2011, apparently not obtained on 03/06.  Albumin on 03/06 was 4.0. Sedimentation rate on 09/20/2011 was 45 as compared with 32 on 09/01/2011.  IMAGING STUDIES: 1. PET scan on 04/16/2010 showed persistent mesenteric and     retroperitoneal lymphadenopathy of the abdomen.  A spleen lesions     seen on the prior study of 01/12/2010  also a PET scan was no     longer visualized and no new disease was seen. 2. PET scan from 07/19/2010 showed stable size of     retroperitoneal/mesenteric adenopathy with decreased metabolic     activity, and no evidence of new or progressive disease.  This     study was compared with the previous study of 04/16/2010. 3. PET scan from 11/08/2010 showed lymphoma recurrence above and below     the diaphragm with new intense hypermetabolic adenopathy.     Hypermetabolic nodes include right supraclavicular, mediastinal and     subcarinal adenopathy.  There  was a new hypermetabolic pulmonary     nodule within the left lower lobe consistent with a pulmonary     metastasis.  There was bulky periaortic hypermetabolic lymph nodes     which were increased in size and metabolic activity.  There was     concern for new splenic metastatic focus.  There was a single     hypermetabolic right external iliac lymph node.  There was also     hypermetabolism within the cecum without evidence of mass. 4. PET scan from 03/11/2011 showed interval complete metabolic     response to therapy of the previously identified widespread Hodgkin     lymphoma above and below the diaphragm with no evidence for     residual metabolically active lymphoma.  The unenhanced CT scan     showed interval marked reduction  in lymphadenopathy in the     mediastinum and the abdominal retroperitoneum. 5. PET scan from 08/02/2011 showed development of hypermetabolic nodes     within the chest, abdomen and pelvis consistent with     residual/recurrent disease.  There was a probable low right     cervical hypermetabolic node.  There was a right lung base     hypermetabolic nodule.  The patient has underlying centrilobular     emphysema.  IMPRESSION AND PLAN:  Clinically Donis is holding his own.  As stated above, he is on Afinitor 10 mg every other day which was started a week ago on April 3rd.  It will be recalled that on March 20th the ANC was 0.8.  Back on February 5th the ANC was 0.5 and the platelet count was 104,000 when Afinitor was held.  Rowen seems to be tolerating the Afinitor fairly well at the present time.  We will continue with current dosage.  We will continue with weekly CBCs.  Port-A-Cath was flushed today and will continue to maintain the Port-A-Cath with heparin flushes every 2 months.  We will plan to see Adric again around May 15th which will be about 5 weeks.  We will check CBC, chemistries including LDH, sedimentation rate as well as lipids and triglycerides.  I  suggested that Nettie may want be fasting for those determinations.  Will also check a PET scan 1 week prior to his visit which will be around May 8th and compare that with the most recent PET scan of 08/02/2011.  It should be noted that the weekly CBCs were done on the smart sets.    ______________________________ [name], M.D. DSM/MEDQ  D:  11/23/2011  T:  11/23/2011  Job:  [id number]

## 2011-11-23 NOTE — Telephone Encounter (Signed)
Gv pt appt for april-may2013.  scheduled pet scan for 05/08 @ [facility]

## 2011-11-30 ENCOUNTER — Other Ambulatory Visit (HOSPITAL_BASED_OUTPATIENT_CLINIC_OR_DEPARTMENT_OTHER): Payer: Self-pay | Admitting: Lab

## 2011-11-30 DIAGNOSIS — C8190 Hodgkin lymphoma, unspecified, unspecified site: Secondary | ICD-10-CM

## 2011-11-30 LAB — CBC WITH DIFFERENTIAL/PLATELET
BASO%: 1.3 % (ref 0.0–2.0)
Eosinophils Absolute: 0.3 10*3/uL (ref 0.0–0.5)
HCT: 29.6 % — ABNORMAL LOW (ref 38.4–49.9)
LYMPH%: 39.6 % (ref 14.0–49.0)
MONO#: 0.5 10*3/uL (ref 0.1–0.9)
NEUT#: 1.1 10*3/uL — ABNORMAL LOW (ref 1.5–6.5)
NEUT%: 33.3 % — ABNORMAL LOW (ref 39.0–75.0)
Platelets: 219 10*3/uL (ref 140–400)
RBC: 3.23 10*6/uL — ABNORMAL LOW (ref 4.20–5.82)
WBC: 3.2 10*3/uL — ABNORMAL LOW (ref 4.0–10.3)
lymph#: 1.3 10*3/uL (ref 0.9–3.3)
nRBC: 0 % (ref 0–0)

## 2011-12-02 ENCOUNTER — Other Ambulatory Visit: Payer: Self-pay

## 2011-12-02 DIAGNOSIS — C8190 Hodgkin lymphoma, unspecified, unspecified site: Secondary | ICD-10-CM

## 2011-12-02 MED ORDER — HYDROCODONE-ACETAMINOPHEN 5-500 MG PO TABS: 1.0000 | ORAL_TABLET | Freq: Four times a day (QID) | ORAL | Status: DC | PRN

## 2011-12-02 NOTE — Telephone Encounter (Signed)
Pt called at 0914 asking for pain med refill. S/w DSM and faxed Rx to [facility]. Called pt that Rx sent. He expressed understanding.

## 2011-12-07 ENCOUNTER — Other Ambulatory Visit (HOSPITAL_BASED_OUTPATIENT_CLINIC_OR_DEPARTMENT_OTHER): Payer: Self-pay | Admitting: Lab

## 2011-12-07 DIAGNOSIS — C8190 Hodgkin lymphoma, unspecified, unspecified site: Secondary | ICD-10-CM

## 2011-12-07 LAB — CBC WITH DIFFERENTIAL/PLATELET
Basophils Absolute: 0 10*3/uL (ref 0.0–0.1)
EOS%: 6.2 % (ref 0.0–7.0)
Eosinophils Absolute: 0.2 10*3/uL (ref 0.0–0.5)
HCT: 32.1 % — ABNORMAL LOW (ref 38.4–49.9)
HGB: 10.4 g/dL — ABNORMAL LOW (ref 13.0–17.1)
MCH: 28.8 pg (ref 27.2–33.4)
NEUT#: 1.1 10*3/uL — ABNORMAL LOW (ref 1.5–6.5)
NEUT%: 28.6 % — ABNORMAL LOW (ref 39.0–75.0)
RDW: 18.8 % — ABNORMAL HIGH (ref 11.0–14.6)
lymph#: 1.7 10*3/uL (ref 0.9–3.3)

## 2011-12-14 ENCOUNTER — Other Ambulatory Visit (HOSPITAL_BASED_OUTPATIENT_CLINIC_OR_DEPARTMENT_OTHER): Payer: Medicare Other | Admitting: Lab

## 2011-12-14 DIAGNOSIS — C8190 Hodgkin lymphoma, unspecified, unspecified site: Secondary | ICD-10-CM

## 2011-12-14 LAB — CBC WITH DIFFERENTIAL/PLATELET
Basophils Absolute: 0 10*3/uL (ref 0.0–0.1)
Eosinophils Absolute: 0.2 10*3/uL (ref 0.0–0.5)
HCT: 31.1 % — ABNORMAL LOW (ref 38.4–49.9)
HGB: 9.9 g/dL — ABNORMAL LOW (ref 13.0–17.1)
LYMPH%: 40.5 % (ref 14.0–49.0)
MCV: 88.4 fL (ref 79.3–98.0)
MONO#: 0.6 10*3/uL (ref 0.1–0.9)
MONO%: 16.3 % — ABNORMAL HIGH (ref 0.0–14.0)
NEUT#: 1.4 10*3/uL — ABNORMAL LOW (ref 1.5–6.5)
NEUT%: 36.1 % — ABNORMAL LOW (ref 39.0–75.0)
Platelets: 258 10*3/uL (ref 140–400)
WBC: 3.9 10*3/uL — ABNORMAL LOW (ref 4.0–10.3)
nRBC: 0 % (ref 0–0)

## 2011-12-20 ENCOUNTER — Other Ambulatory Visit: Payer: Self-pay

## 2011-12-21 ENCOUNTER — Encounter (HOSPITAL_COMMUNITY): Payer: Self-pay

## 2011-12-21 ENCOUNTER — Telehealth: Payer: Self-pay | Admitting: Nurse Practitioner

## 2011-12-21 ENCOUNTER — Encounter (HOSPITAL_COMMUNITY)
Admission: RE | Admit: 2011-12-21 | Discharge: 2011-12-21 | Disposition: A | Discharge status: Home / Self Care | Payer: Self-pay | Source: Ambulatory Visit | Attending: Oncology | Admitting: Oncology

## 2011-12-21 ENCOUNTER — Other Ambulatory Visit (HOSPITAL_BASED_OUTPATIENT_CLINIC_OR_DEPARTMENT_OTHER): Payer: Self-pay | Admitting: Lab

## 2011-12-21 DIAGNOSIS — C8190 Hodgkin lymphoma, unspecified, unspecified site: Secondary | ICD-10-CM

## 2011-12-21 LAB — CBC WITH DIFFERENTIAL/PLATELET
Basophils Absolute: 0 10*3/uL (ref 0.0–0.1)
EOS%: 8.3 % — ABNORMAL HIGH (ref 0.0–7.0)
HCT: 30.4 % — ABNORMAL LOW (ref 38.4–49.9)
HGB: 9.7 g/dL — ABNORMAL LOW (ref 13.0–17.1)
LYMPH%: 41.7 % (ref 14.0–49.0)
MCH: 27.5 pg (ref 27.2–33.4)
MCHC: 31.9 g/dL — ABNORMAL LOW (ref 32.0–36.0)
MCV: 86.1 fL (ref 79.3–98.0)
NEUT%: 28.1 % — ABNORMAL LOW (ref 39.0–75.0)
Platelets: 216 10*3/uL (ref 140–400)
lymph#: 1.2 10*3/uL (ref 0.9–3.3)

## 2011-12-21 MED ORDER — FLUDEOXYGLUCOSE F - 18 (FDG) INJECTION
18.1000 | Freq: Once | INTRAVENOUS | Status: AC | PRN
  Administered 2011-12-21: 18.1 via INTRAVENOUS

## 2011-12-21 NOTE — Telephone Encounter (Signed)
Spoke with patient re: white count low- need to stop Affinitor x 1 week then recheck labs.  Instructed patient on nuetropenic precautions and to call for fever or other signs of infection.  Pt verbalized understanding.

## 2011-12-29 ENCOUNTER — Telehealth: Payer: Self-pay | Admitting: Oncology

## 2011-12-29 ENCOUNTER — Encounter: Payer: Self-pay | Admitting: Oncology

## 2011-12-29 ENCOUNTER — Other Ambulatory Visit (HOSPITAL_BASED_OUTPATIENT_CLINIC_OR_DEPARTMENT_OTHER): Payer: Medicare Other | Admitting: Lab

## 2011-12-29 ENCOUNTER — Ambulatory Visit (HOSPITAL_BASED_OUTPATIENT_CLINIC_OR_DEPARTMENT_OTHER): Payer: Self-pay | Admitting: Oncology

## 2011-12-29 DIAGNOSIS — D72819 Decreased white blood cell count, unspecified: Secondary | ICD-10-CM

## 2011-12-29 DIAGNOSIS — C8190 Hodgkin lymphoma, unspecified, unspecified site: Secondary | ICD-10-CM

## 2011-12-29 DIAGNOSIS — D709 Neutropenia, unspecified: Secondary | ICD-10-CM

## 2011-12-29 DIAGNOSIS — J449 Chronic obstructive pulmonary disease, unspecified: Secondary | ICD-10-CM

## 2011-12-29 DIAGNOSIS — C8193 Hodgkin lymphoma, unspecified, intra-abdominal lymph nodes: Secondary | ICD-10-CM

## 2011-12-29 LAB — CBC WITH DIFFERENTIAL/PLATELET
Eosinophils Absolute: 0.1 10*3/uL (ref 0.0–0.5)
HCT: 31.4 % — ABNORMAL LOW (ref 38.4–49.9)
LYMPH%: 26.1 % (ref 14.0–49.0)
MONO#: 0.7 10*3/uL (ref 0.1–0.9)
NEUT#: 1.4 10*3/uL — ABNORMAL LOW (ref 1.5–6.5)
NEUT%: 47 % (ref 39.0–75.0)
Platelets: 241 10*3/uL (ref 140–400)
WBC: 3 10*3/uL — ABNORMAL LOW (ref 4.0–10.3)

## 2011-12-29 NOTE — Telephone Encounter (Signed)
called pt with 6/10 appt info  aom

## 2011-12-29 NOTE — Progress Notes (Signed)
CC:   Keith Bell, M.D. [name], MD  PROBLEM LIST:  1. Hodgkin's lymphoma, diagnosed in March 2009, stage IV with positive  bone marrow on 11/21/2007. Keith Bell had a biopsy of a retroperitoneal  lymph node on 11/13/2007 to establish the diagnosis. At that time,  Keith Bell had intra-abdominal disease and splenomegaly. Keith Bell was having  night sweats and fevers so Keith Bell was stage IVB. Keith Bell received  Adriamycin, Velban, DTIC and Decadron approximately 4 and a half  cycles from 11/29/2007 through 04/03/2008 with only a partial  response. Bleomycin was omitted because of Keith Bell's underlying COPD.  Keith Bell then received Rituxan with COPP for 6 cycles from 04/18/2008  through February 2010. Keith Bell had an initial partial response and then  progressive disease. This was determined on 10/30/2008 by a PET-CT  scan. Keith Bell then received the RICE program for 6 cycles from  12/15/2008 through late August 2010 with an apparent complete  remission. Keith Bell then had recurrence of disease noted and was treated  with Doxil, Navelbine and gemcitabine for 8 and a half cycles from  October 15, 2009 through April 21, 2010 with a partial response.  Keith Bell then received brentuximab (Adcetris) from 05/25/2010 through  10/21/2010 with unfortunately progressive disease. Keith Bell was then switched to the program of  bendamustine, Rituxan and Neulasta on 11/16/2010 and received 9 cycles.  Most recent course with that program was given on 07/18/2011. A PET  scan on 03/11/2011 showed a complete response. A bone marrow on  03/31/2011 was negative. Unfortunately on 08/02/2011 PET scan showed  relapse. At that point, Keith Bell had received a total of 6 different  chemotherapy regimens.  Keith Bell was started on Afinitor 10 mg daily on September 06, 2011.  Approximately 2 weeks later on February 5th, the white count had dropped  to 1.9 with an ANC of 0.5 and a platelet count of a 104,000. Afinitor  was discontinued. Afinitor was restarted on 10/19/2011, however by    03/20 the ANC was 0.8, and we once again held Afinitor. On April 3rd  Afinitor was started at 10 mg every other day, and so far, Keith Bell is  tolerating this well.  2. Chronic obstructive pulmonary disease.  3. Peripheral vascular disease with claudication of right leg.  4. Recurrent hypokalemia.  5. History of hypertension.  6. Dyslipidemia.  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    CURRENT MEDICINES:  1. Vicodin once a day for right leg pain.  2. Zofran as needed.  3. K-Dur 20 mEq daily  4. Compazine as needed.  5. Protonix 40 mg daily  6. Current Afinitor dose is now 10 mg every other day as of April 3rd.  It will be recalled that Afinitor was started on September 06, 2011,  but needed to be stopped after approximately 2 weeks of treatment  around February 5th. Afinitor was restarted at 10 mg daily around  March 20th, but then again need to be stopped because of  myelosuppression. Afinitor was restarted at 10 mg every other day  on April 3rd. Afinitor was discontinued for approximately 1 week on 12/21/2011 because of a white count of 2.9, and an ANC is 0.8.  Afinitor was restarted on 12/29/2011.  White count was 3.0, ANC 1.4.     HISTORY:  Keith Bell was seen today for followup of his relapsed stage IVB Hodgkin lymphoma diagnosed in March 2009 when Keith Bell  presented with stage IVB disease.  Keith Bell was last seen by [place] on 11/23/2011.  Keith Bell lives  by himself.  I do not believe Keith Bell has a car, so Keith Bell is not driving.  His overall condition really has not changed appreciably.  Keith Bell may be having some night sweats, but not occurring every night.  Keith Bell denies any fever or pain except for the pain in his right leg from intermittent claudication.  We stopped his Afinitor a week ago because of an ANC of 0.8.  Will be restarting the Afinitor today.  Keith Bell underwent a PET scan on 12/21/2011 that showed and no significant changes.  PHYSICAL EXAMINATION:  There is little change.  Keith Bell looks  chronically ill and somewhat cachectic.  Weight is 131.1 pounds,  height 5 feet 5-1/2 inches.  Body surface area 1.66 sq m. Blood pressure 144/86.  Other vital signs are normal.  Keith Bell is afebrile.  There is no scleral icterus. Sclerae are muddy.  Mouth and pharynx benign.  No peripheral adenopathy palpable.  There is a right-sided Port-A-Cath that was flushed a month ago.  Lungs:  Clear.  Cardiac:  Regular rhythm without murmur or rub. Abdomen:  Benign, nontender.  Extremities:  No peripheral edema or clubbing.  No evidence for hand-foot syndrome.  Neurologic: Normal.  LABORATORY DATA:  Today, white count 3.0, ANC 1.4 hemoglobin 10.2, hematocrit 31.4, platelets 241,000.  Chemistries, LDH and sedimentation rate today are pending.  Chemistries from 11/23/2011 notable for an alkaline phosphatase of 173,  albumin was 3.9, LDH 144, creatinine 0.96. Lipid profile on 04/10 yielded triglycerides of that were normal of 146. Total cholesterol was 187, which is normal.  The LDL was 126, with normal being 0-99, and the HDL was 32 with normal being greater than 39. Sedimentation rate on 04/10 was 58 as compared with 45 on 02/15 and 32 on 01/17.  IMAGING STUDIES:  1. PET scan on 04/16/2010 showed persistent mesenteric and  retroperitoneal lymphadenopathy of the abdomen. A spleen lesions  seen on the prior study of 01/12/2010 also a PET scan was no  longer visualized and no new disease was seen.  2. PET scan from 07/19/2010 showed stable size of  retroperitoneal/mesenteric adenopathy with decreased metabolic  activity, and no evidence of new or progressive disease. This  study was compared with the previous study of 04/16/2010.  3. PET scan from 11/08/2010 showed lymphoma recurrence above and below  the diaphragm with new intense hypermetabolic adenopathy.  Hypermetabolic nodes include right supraclavicular, mediastinal and  subcarinal adenopathy. There was a new hypermetabolic pulmonary  nodule  within the left lower lobe consistent with a pulmonary  metastasis. There was bulky periaortic hypermetabolic lymph nodes  which were increased in size and metabolic activity. There was  concern for new splenic metastatic focus. There was a single  hypermetabolic right external iliac lymph node. There was also  hypermetabolism within the cecum without evidence of mass.  4. PET scan from 03/11/2011 showed interval complete metabolic  response to therapy of the previously identified widespread Hodgkin  lymphoma above and below the diaphragm with no evidence for  residual metabolically active lymphoma. The unenhanced CT scan  showed interval marked reduction in lymphadenopathy in the  mediastinum and the abdominal retroperitoneum.  5. PET scan from 08/02/2011 showed development of hypermetabolic nodes  within the chest, abdomen and pelvis consistent with  residual/recurrent disease. There was a probable low right  cervical hypermetabolic node. There was a right lung base  hypermetabolic nodule. The patient has underlying centrilobular  emphysema. 6. PET scan on 12/21/2011 showed minimal increase in the metabolic  activity associated with a right hilar lymph node and a left     periaortic lymph node.  There was no evidence for any new     adenopathy.  There were no hypermetabolic retroperitoneal lymph     nodes, or any abnormal activity within the spleen, pelvis or     inguinal lymph nodes.  No focal hypermetabolic activity to suggest     skeletal metastasis.  IMPRESSION AND PLAN:  Nollie's condition is generally stable.  It is reassuring that the PET scan from 12/21/2011 compared with the PET scan of 08/02/2011 showed no major changes.  Evann is holding his own.  We are having problems with leukopenia neutropenia for which Afinitor occasionally needs to be held for about a week.  Will restart the Afinitor 10 mg every other day.  We will check CBCs on a weekly basis.  This was ordered  through the [employer].  We will plan to see Vint again in 4 weeks which will be June 13th at which time we will check CBC, chemistries, including an LDH sed rate.  Taje will need his Port-A-Cath flushed with heparin at that time.    ______________________________ [name], M.D. DSM/MEDQ  D:  12/29/2011  T:  12/29/2011  Job:  [id number]

## 2011-12-29 NOTE — Progress Notes (Signed)
This office note has been dictated.  #[id number]

## 2011-12-30 ENCOUNTER — Telehealth: Payer: Self-pay

## 2011-12-30 LAB — LIPID PANEL
Cholesterol: 159 mg/dL (ref 0–200)
LDL Cholesterol: 103 mg/dL — ABNORMAL HIGH (ref 0–99)
Triglycerides: 87 mg/dL (ref <150)

## 2011-12-30 LAB — COMPREHENSIVE METABOLIC PANEL
ALT: <8 U/L (ref 0–53)
BUN: 10 mg/dL (ref 6–23)
CO2: 26 mEq/L (ref 19–32)
Calcium: 8.7 mg/dL (ref 8.4–10.5)
Chloride: 105 mEq/L (ref 96–112)
Creatinine, Ser: 1.02 mg/dL (ref 0.50–1.35)

## 2011-12-30 LAB — LACTATE DEHYDROGENASE: LDH: 121 U/L (ref 94–250)

## 2011-12-30 NOTE — Telephone Encounter (Signed)
S/w pt that DSM wants him to increase his potassium pills to 2/day from 1/day, his potassium level was 3.3. Pt expressed understanding.

## 2012-01-03 ENCOUNTER — Telehealth: Payer: Self-pay | Admitting: Oncology

## 2012-01-03 NOTE — Telephone Encounter (Signed)
Called pt, phone not taking calls , called pt contact talked to [facility] gave her appt for 6/11, moved from 6/10 per MD

## 2012-01-04 ENCOUNTER — Telehealth: Payer: Self-pay | Admitting: Medical Oncology

## 2012-01-04 ENCOUNTER — Other Ambulatory Visit (HOSPITAL_BASED_OUTPATIENT_CLINIC_OR_DEPARTMENT_OTHER): Payer: Self-pay | Admitting: Lab

## 2012-01-04 DIAGNOSIS — C8193 Hodgkin lymphoma, unspecified, intra-abdominal lymph nodes: Secondary | ICD-10-CM

## 2012-01-04 DIAGNOSIS — C8190 Hodgkin lymphoma, unspecified, unspecified site: Secondary | ICD-10-CM

## 2012-01-04 LAB — CBC WITH DIFFERENTIAL/PLATELET
Basophils Absolute: 0.1 10*3/uL (ref 0.0–0.1)
EOS%: 6.2 % (ref 0.0–7.0)
Eosinophils Absolute: 0.2 10*3/uL (ref 0.0–0.5)
HCT: 30.9 % — ABNORMAL LOW (ref 38.4–49.9)
HGB: 9.9 g/dL — ABNORMAL LOW (ref 13.0–17.1)
MCH: 27.4 pg (ref 27.2–33.4)
NEUT#: 1 10*3/uL — ABNORMAL LOW (ref 1.5–6.5)
NEUT%: 30.8 % — ABNORMAL LOW (ref 39.0–75.0)
lymph#: 1.4 10*3/uL (ref 0.9–3.3)

## 2012-01-04 NOTE — Telephone Encounter (Signed)
I called pt per Dr. Arline Asp to let him know to continue his afinitor 10 mg every other day. He voiced understanding.

## 2012-01-11 ENCOUNTER — Other Ambulatory Visit (HOSPITAL_BASED_OUTPATIENT_CLINIC_OR_DEPARTMENT_OTHER): Payer: Self-pay | Admitting: Lab

## 2012-01-11 DIAGNOSIS — C8190 Hodgkin lymphoma, unspecified, unspecified site: Secondary | ICD-10-CM

## 2012-01-11 LAB — CBC WITH DIFFERENTIAL/PLATELET
Basophils Absolute: 0 10*3/uL (ref 0.0–0.1)
Eosinophils Absolute: 0.4 10*3/uL (ref 0.0–0.5)
HCT: 29.1 % — ABNORMAL LOW (ref 38.4–49.9)
HGB: 9.4 g/dL — ABNORMAL LOW (ref 13.0–17.1)
LYMPH%: 24.1 % (ref 14.0–49.0)
MONO#: 0.7 10*3/uL (ref 0.1–0.9)
NEUT#: 1.4 10*3/uL — ABNORMAL LOW (ref 1.5–6.5)
NEUT%: 42 % (ref 39.0–75.0)
Platelets: 201 10*3/uL (ref 140–400)
WBC: 3.4 10*3/uL — ABNORMAL LOW (ref 4.0–10.3)
lymph#: 0.8 10*3/uL — ABNORMAL LOW (ref 0.9–3.3)

## 2012-01-13 ENCOUNTER — Telehealth: Payer: Self-pay | Admitting: Nurse Practitioner

## 2012-01-13 NOTE — Telephone Encounter (Signed)
Received note from [place], [place] that patient had called c/o "sharp pain under R shoulder blade for last week. night sweats for last week.  Increased fatigue.  No increase SOB. Occasional cough.  Taking Afinitor, vicodin, and ibuprofen".  Per Dr. Arline Asp "if pain continues, we should order a CT angio of chest with IVC."  Called to assess pt- received message that phone was not receiving calls.  Spoke with Venita Lick- pt emergency contact.  Requested patient to call this office and ask for Dr. Mamie Levers nurse so that he can be further assessed.

## 2012-01-17 ENCOUNTER — Telehealth: Payer: Self-pay | Admitting: Medical Oncology

## 2012-01-17 ENCOUNTER — Emergency Department (HOSPITAL_COMMUNITY)
Admission: EM | Admit: 2012-01-17 | Discharge: 2012-01-17 | Disposition: A | Discharge status: Home / Self Care | Payer: Medicare Other | Attending: Emergency Medicine | Admitting: Emergency Medicine

## 2012-01-17 ENCOUNTER — Emergency Department (HOSPITAL_COMMUNITY): Payer: Medicare Other

## 2012-01-17 ENCOUNTER — Encounter (HOSPITAL_COMMUNITY): Payer: Self-pay

## 2012-01-17 DIAGNOSIS — I10 Essential (primary) hypertension: Secondary | ICD-10-CM | POA: Insufficient documentation

## 2012-01-17 DIAGNOSIS — Z85028 Personal history of other malignant neoplasm of stomach: Secondary | ICD-10-CM | POA: Insufficient documentation

## 2012-01-17 DIAGNOSIS — J189 Pneumonia, unspecified organism: Secondary | ICD-10-CM

## 2012-01-17 DIAGNOSIS — Z79899 Other long term (current) drug therapy: Secondary | ICD-10-CM | POA: Insufficient documentation

## 2012-01-17 DIAGNOSIS — R059 Cough, unspecified: Secondary | ICD-10-CM | POA: Insufficient documentation

## 2012-01-17 DIAGNOSIS — R05 Cough: Secondary | ICD-10-CM | POA: Insufficient documentation

## 2012-01-17 DIAGNOSIS — E785 Hyperlipidemia, unspecified: Secondary | ICD-10-CM | POA: Insufficient documentation

## 2012-01-17 MED ORDER — GUAIFENESIN ER 1200 MG PO TB12: 1.0000 | ORAL_TABLET | Freq: Two times a day (BID) | ORAL | Status: DC

## 2012-01-17 MED ORDER — AZITHROMYCIN 250 MG PO TABS: ORAL_TABLET | ORAL | Status: DC

## 2012-01-17 MED ORDER — CEFTRIAXONE SODIUM 1 G IJ SOLR
1.0000 g | Freq: Once | INTRAMUSCULAR | Status: AC
  Administered 2012-01-17: 1 g via INTRAMUSCULAR
  Filled 2012-01-17: qty 10

## 2012-01-17 MED ORDER — ACETAMINOPHEN-CODEINE 120-12 MG/5ML PO SOLN: 10.0000 mL | ORAL | Status: DC | PRN

## 2012-01-17 NOTE — Discharge Instructions (Signed)
Return here as needed. Follow up with your doctor for a recheck. Increase your fluids.

## 2012-01-17 NOTE — ED Notes (Signed)
RT shoulder pain x 1 week.  Unsure of the cause. Denies any other issues.  States it hurts more a night.  Has been taking his vicodin 5/500mg  (Rx'd for cancer pain) which has been helping

## 2012-01-17 NOTE — Telephone Encounter (Signed)
Pt had called in last Friday with chest pain under his shoulder blade and some shortness of breath. I called him back to see how he is feeling. He states not too good and he actually was in the ER as I called. He states the pain is the worse when he coughs and he is short of breath. He just got to feeling worse so he went to the ER. I will notify Dr. Arline Asp.

## 2012-01-17 NOTE — ED Provider Notes (Signed)
History     CSN: [id number]  Arrival date & time 01/17/12  1126   First MD Initiated Contact with Patient 01/17/12 1201      Chief Complaint  Patient presents with  . Shoulder Pain    (Consider location/radiation/quality/duration/timing/severity/associated sxs/prior treatment) HPI The patient presents with cough and pain below his R scapula for the last week. The patient patient states that he has not had shortness of breath, nausea, vomiting, fever, weakness, abdominal pain or hemoptysis.  Patient, states that he was seen by his doctor and they are going to assess with x-rays, but never ordered them.  Patient states is not any shortness of breath with exertion.  Past Medical History  Diagnosis Date  . Dyslipidemia 2005  . Cancer     stomach  . Hypertension     No longer on BP pills x 4 years    History reviewed. No pertinent past surgical history.  Family History  Problem Relation Age of Onset  . Heart disease Mother   . Heart disease Father   . Cancer Father   . Cancer Sister   . Cancer Sister     History  Substance Use Topics  . Smoking status: Current Everyday Smoker -- 1.0 packs/day for 30 years  . Smokeless tobacco: Not on file  . Alcohol Use: No      Review of Systems All other systems negative except as documented in the HPI. All pertinent positives and negatives as reviewed in the HPI.  Allergies  Aspirin  Home Medications   Current Outpatient Rx  Name Route Sig Dispense Refill  . EVEROLIMUS 10 MG PO TABS Oral Take 10 mg by mouth every other day. Take 1 po every  Other day    . HYDROCODONE-ACETAMINOPHEN 5-500 MG PO TABS Oral Take 1 tablet by mouth every 6 (six) hours as needed for pain. 30 tablet 3  . ONE-DAILY MULTI VITAMINS PO TABS Oral Take 1 tablet by mouth daily.    Marland Kitchen ONDANSETRON HCL 8 MG PO TABS Oral Take 8 mg by mouth every 8 (eight) hours as needed. For nausea    . PANTOPRAZOLE SODIUM 40 MG PO TBEC Oral Take 1 tablet (40 mg total) by mouth  daily. 30 tablet 6  . POTASSIUM CHLORIDE CRYS ER 20 MEQ PO TBCR Oral Take 20 mEq by mouth daily.    Marland Kitchen PROCHLORPERAZINE MALEATE 10 MG PO TABS Oral Take 10 mg by mouth every 6 (six) hours as needed. For nausea    . PROCHLORPERAZINE MALEATE 10 MG PO TABS Oral Take 1 tablet (10 mg total) by mouth every 6 (six) hours as needed. 30 tablet 0    BP 108/77  Pulse 92  Temp 98.3 F (36.8 C)  Resp 18  Wt 131 lb (59.421 kg)  SpO2 100%  Physical Exam  Nursing note and vitals reviewed. Constitutional: He is oriented to person, place, and time. He appears well-developed and well-nourished. No distress.  HENT:  Head: Normocephalic and atraumatic.  Mouth/Throat: Oropharynx is clear and moist.  Eyes: Pupils are equal, round, and reactive to light.  Neck: Normal range of motion. Neck supple.  Cardiovascular: Normal rate, regular rhythm and normal heart sounds.  Exam reveals no gallop and no friction rub.   No murmur heard. Pulmonary/Chest: Effort normal. No respiratory distress. He has no wheezes. He exhibits no tenderness.       Patient has some mild crackles in the right upper lung field  Musculoskeletal:  Arms: Neurological: He is alert and oriented to person, place, and time.  Skin: Skin is warm and dry. No rash noted.    ED Course  Procedures (including critical care time)  Labs Reviewed - No data to display Dg Chest 2 View  01/17/2012  *RADIOLOGY REPORT*  Clinical Data: Pain in the right shoulder, fever, cough, history of Hodgkin's lymphoma  CHEST - 2 VIEW  Comparison: PET CT of 12/21/2011 and chest x-ray of 04/29/2011  Findings: There is parenchymal opacity within the right upper lung field suspicious for pneumonia.  Otherwise the lungs are clear.  No effusion is seen. Mediastinal contours appear normal.  A power port Port-A-Cath remains with the tip in the mid SVC.  Heart size is stable.  No bony abnormality is seen.  IMPRESSION:  1.  Right upper lobe opacity most consistent with  pneumonia. 2.  Port-A-Cath tip remains in the mid SVC.  Original Report Authenticated By: Juline Patch, M.D.   Dg Shoulder Right  01/17/2012  *RADIOLOGY REPORT*  Clinical Data: Pain in the right shoulder, fever  RIGHT SHOULDER - 2+ VIEW  Comparison: None.  Findings: There is parenchymal opacity in the right upper lobe most consistent with pneumonia.  The right humeral head is in normal position and the glenohumeral joint space appears normal.  There is mild right AC joint degenerative spurring present.  No acute bony abnormality is seen.  IMPRESSION:  1.  Mild degenerative change at the right [facility] joint. 2.  Opacity in the right upper lobe suspicious for pneumonia.  Original Report Authenticated By: Juline Patch, M.D.   Patient has stable vital signs and saturation here in the emergency department.  Patient is ambulated and has no difficulty or desaturation.  Patient be treated for community-acquired pneumonia and asked to follow up with his doctor, Dr. Petra Kuba.  He is told to return here to the emergency room for any worsening in his condition.  He is given an IM injection of Rocephin along with Zithromax and cough suppressant for home.     MDM   MDM Reviewed: nursing note and vitals Interpretation: x-ray           Carlyle Dolly, PA-C 01/18/12 1605

## 2012-01-18 ENCOUNTER — Other Ambulatory Visit: Payer: Self-pay

## 2012-01-19 NOTE — ED Provider Notes (Signed)
Medical screening examination/treatment/procedure(s) were performed by non-physician practitioner and as supervising physician I was immediately available for consultation/collaboration.   Dayton Bailiff, MD 01/19/12 [phone]

## 2012-01-20 ENCOUNTER — Encounter: Payer: Self-pay | Admitting: Medical Oncology

## 2012-01-20 NOTE — Progress Notes (Signed)
I called pt to see how he is feeling. He states that he is beginning to feel better. He was seen in the ER 01/17/12 and diagnosed with pneumonia. He was given Rocephin IM and he been taking zithromycin and cough medication. He does not have any fever and states the pain is much better. He states he is improving daily. Dr. Arline Asp notified.

## 2012-01-23 ENCOUNTER — Ambulatory Visit: Payer: Self-pay | Admitting: Oncology

## 2012-01-23 ENCOUNTER — Other Ambulatory Visit: Payer: Self-pay

## 2012-01-24 ENCOUNTER — Telehealth: Payer: Self-pay | Admitting: Oncology

## 2012-01-24 ENCOUNTER — Encounter: Payer: Self-pay | Admitting: Oncology

## 2012-01-24 ENCOUNTER — Ambulatory Visit (HOSPITAL_BASED_OUTPATIENT_CLINIC_OR_DEPARTMENT_OTHER): Payer: Medicare Other | Admitting: Oncology

## 2012-01-24 ENCOUNTER — Other Ambulatory Visit (HOSPITAL_BASED_OUTPATIENT_CLINIC_OR_DEPARTMENT_OTHER): Payer: Self-pay | Admitting: Lab

## 2012-01-24 VITALS — BP 116/80 | HR 115 | Temp 98.2°F | Ht 65.5 in | Wt 122.7 lb

## 2012-01-24 DIAGNOSIS — C8193 Hodgkin lymphoma, unspecified, intra-abdominal lymph nodes: Secondary | ICD-10-CM

## 2012-01-24 DIAGNOSIS — D649 Anemia, unspecified: Secondary | ICD-10-CM

## 2012-01-24 DIAGNOSIS — C8190 Hodgkin lymphoma, unspecified, unspecified site: Secondary | ICD-10-CM

## 2012-01-24 LAB — COMPREHENSIVE METABOLIC PANEL
AST: 12 U/L (ref 0–37)
Albumin: 2.9 g/dL — ABNORMAL LOW (ref 3.5–5.2)
Alkaline Phosphatase: 211 U/L — ABNORMAL HIGH (ref 39–117)
BUN: 17 mg/dL (ref 6–23)
Calcium: 9.8 mg/dL (ref 8.4–10.5)
Chloride: 103 mEq/L (ref 96–112)
Creatinine, Ser: 1.51 mg/dL — ABNORMAL HIGH (ref 0.50–1.35)
Glucose, Bld: 133 mg/dL — ABNORMAL HIGH (ref 70–99)
Potassium: 3.7 mEq/L (ref 3.5–5.3)

## 2012-01-24 LAB — CBC WITH DIFFERENTIAL/PLATELET
Basophils Absolute: 0 10*3/uL (ref 0.0–0.1)
EOS%: 7.8 % — ABNORMAL HIGH (ref 0.0–7.0)
Eosinophils Absolute: 0.2 10*3/uL (ref 0.0–0.5)
HCT: 25.2 % — ABNORMAL LOW (ref 38.4–49.9)
HGB: 8.1 g/dL — ABNORMAL LOW (ref 13.0–17.1)
MCH: 27 pg — ABNORMAL LOW (ref 27.2–33.4)
MCV: 84.4 fL (ref 79.3–98.0)
MONO%: 15.7 % — ABNORMAL HIGH (ref 0.0–14.0)
NEUT#: 1.5 10*3/uL (ref 1.5–6.5)
NEUT%: 49 % (ref 39.0–75.0)

## 2012-01-24 LAB — TECHNOLOGIST REVIEW

## 2012-01-24 LAB — SEDIMENTATION RATE: Sed Rate: 139 mm/hr — ABNORMAL HIGH (ref 0–16)

## 2012-01-24 NOTE — Progress Notes (Signed)
CC:   Keith Bell, M.D. Keith Ou, MD  PROBLEM LIST:  1. Hodgkin's lymphoma, diagnosed in March 2009, stage IV with positive  bone marrow on 11/21/2007. Keith Bell had a biopsy of a retroperitoneal  lymph node on 11/13/2007 to establish the diagnosis. At that time,  he had intra-abdominal disease and splenomegaly. He was having  night sweats and fevers so he was stage IVB. He received  Adriamycin, Velban, DTIC and Decadron approximately 4 and a half  cycles from 11/29/2007 through 04/03/2008 with only a partial  response. Bleomycin was omitted because of Keith Bell's underlying COPD.  He then received Rituxan with COPP for 6 cycles from 04/18/2008  through February 2010. He had an initial partial response and then  progressive disease. This was determined on 10/30/2008 by a PET-CT  scan. Keith Bell then received the RICE program for 6 cycles from  12/15/2008 through late August 2010 with an apparent complete  remission. He then had recurrence of disease noted and was treated  with Doxil, Navelbine and gemcitabine for 8 and a half cycles from  October 15, 2009 through April 21, 2010 with a partial response.  He then received brentuximab (Adcetris) from 05/25/2010 through  10/21/2010 with unfortunately progressive disease. He was then switched to the program of  bendamustine, Rituxan and Neulasta on 11/16/2010 and received 9 cycles.  Most recent course with that program was given on 07/18/2011. A PET  scan on 03/11/2011 showed a complete response. A bone marrow on  03/31/2011 was negative. Unfortunately on 08/02/2011 PET scan showed  relapse. At that point, Keith Bell had received a total of 6 different  chemotherapy regimens.  Keith Bell was started on Afinitor 10 mg daily on September 06, 2011.  Approximately 2 weeks later on February 5th, the white count had dropped  to 1.9 with an ANC of 0.5 and a platelet count of a 104,000. Afinitor  was discontinued. Afinitor was restarted on 10/19/2011, however by    03/20 the ANC was 0.8, and we once again held Afinitor. On April 3rd  Afinitor was started at 10 mg every other day, and so far, Keith Bell is  tolerating this well.  2. Chronic obstructive pulmonary disease.  3. Peripheral vascular disease with claudication of right leg.  4. Recurrent hypokalemia.  5. History of hypertension.  6. Dyslipidemia.  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 8. Weight loss. 9. Anemia felt to be most likely due to the anemia of the patient's     cancer and recent right upper lobe pneumonia. 10.Right upper lobe pneumonia diagnosed by chest x-ray on 06/04.  CURRENT MEDICINES:  1. Vicodin once a day for right leg pain.  2. Zofran as needed.  3. Keith Bell 20 mEq daily  4. Compazine as needed.  5. Protonix 40 mg daily. 6. Multivitamins one daily.  7. Current Afinitor dose is now 10 mg every other day as of April 3rd.  It will be recalled that Afinitor was started on September 06, 2011,  but needed to be stopped after approximately 2 weeks of treatment  around February 5th. Afinitor was restarted at 10 mg daily around  March 20th, but then again need to be stopped because of  myelosuppression. Afinitor was restarted at 10 mg every other day  on April 3rd. Afinitor was discontinued for approximately 1 week on 12/21/2011  because of a white count of 2.9, and an ANC is 0.8. Afinitor was  restarted on 12/29/2011. White count was 3.0, ANC 1.4.   HISTORY:  I saw Keith Bell today for followup of his relapsed stage IVB Hodgkin lymphoma diagnosed in March 2009 when Keith Bell presented with stage IVB disease.  Keith Bell was last seen by [place] on 12/29/2011.  He has continued on Afinitor for 10 mg every other day.  His last PET scan was carried out on 12/21/2011 and showed no major changes.  We have been checking weekly blood counts since Keith Bell has had some neutropenia in the past, most recently going back to 12/21/2011, when his ANC was 0.8. Since then, blood counts have held up  reasonably well.  Keith Bell started having pleuritic pain in the region of his right scapula, also night sweats about 7 to 10 days ago.  Initially it was not clear what the problem was.  Subsequently Keith Bell started having fever.  He went to the emergency room around June 4th at which time he had a chest x-ray which showed right upper lobe infiltrate.  I have reviewed the chest x- ray.  Keith Bell was given IV Rocephin and a Z-Pak.  He states that the night sweats have resolved.  His pleuritic chest pain is essentially unchanged.  He does not have pain if he does in cough or take a deep breath.  This is located in the region of his right scapula.  He feels generally weak.  He has had a poor appetite and he has lost about 8 or 9 pounds over the past month.  He is no longer having any fever and no longer having night sweats.  He has denies any shortness of breath.  He does feel generally weak and has some lightheadedness when he stands up. I believe he is living alone.  PHYSICAL EXAMINATION:  General:  Keith Bell looks cachectic, chronically ill but in no acute distress.  Vital signs:  Weight today is 122.7 pounds, height 5 feet 5 5-1/2 inches, body surface area 1.6 m squared.  Blood pressure 116/80.  Other vital signs are normal except for a regular tachycardia of about 110.  Temperature is 98.2, O2 saturation on room air at rest was 98%.  HEENT:  There is no scleral icterus.  Mouth and pharynx are benign.  No peripheral adenopathy palpable in the neck, supraclavicular, axillary or inguinal areas.  Lungs:  Clear.  I could not define any egophony, pleural rubs, rales, etc.  Cardiac:  Regular rhythm without murmur or rub.  There is a right-sided Port-A-Cath that was flushed with heparin today.  Abdomen:  Today raises concern about some adenopathy in the epigastric region.  I could not define any hepatomegaly or splenomegaly.  There is no ascites or other abdominal masses other than in the area of the  epigastrium,  No tenderness. Extremities:  No peripheral edema.  No evidence for hand-foot syndrome. Neurologic:  Exam is grossly normal.  LABORATORY DATA:  Today, white count 3.0, ANC 1.5, hemoglobin 8.1, hematocrit 25.2, platelets 212,000.  There were some large and giant platelets and rare metamyelocytes.  Chemistries today notable for a BUN of 17, creatinine was 1.51 as compared with 1.02 on 12/29/2011. Alkaline phosphatase 211, albumin 2.9, down from 3.7 on 05/16.  LDH was 138.  Sedimentation rate today is pending.  Sedimentation rate from 12/29/2011 was 51.  IMAGING STUDIES:  1. PET scan on 04/16/2010 showed persistent mesenteric and  retroperitoneal lymphadenopathy of the abdomen. A spleen lesions  seen on the prior study of 01/12/2010 also a PET scan was no  longer visualized and no new disease was seen.  2. PET  scan from 07/19/2010 showed stable size of  retroperitoneal/mesenteric adenopathy with decreased metabolic  activity, and no evidence of new or progressive disease. This  study was compared with the previous study of 04/16/2010.  3. PET scan from 11/08/2010 showed lymphoma recurrence above and below  the diaphragm with new intense hypermetabolic adenopathy.  Hypermetabolic nodes include right supraclavicular, mediastinal and  subcarinal adenopathy. There was a new hypermetabolic pulmonary  nodule within the left lower lobe consistent with a pulmonary  metastasis. There was bulky periaortic hypermetabolic lymph nodes  which were increased in size and metabolic activity. There was  concern for new splenic metastatic focus. There was a single  hypermetabolic right external iliac lymph node. There was also  hypermetabolism within the cecum without evidence of mass.  4. PET scan from 03/11/2011 showed interval complete metabolic  response to therapy of the previously identified widespread Hodgkin  lymphoma above and below the diaphragm with no evidence for  residual  metabolically active lymphoma. The unenhanced CT scan  showed interval marked reduction in lymphadenopathy in the  mediastinum and the abdominal retroperitoneum.  5. PET scan from 08/02/2011 showed development of hypermetabolic nodes  within the chest, abdomen and pelvis consistent with  residual/recurrent disease. There was a probable low right  cervical hypermetabolic node. There was a right lung base  hypermetabolic nodule. The patient has underlying centrilobular  emphysema.  6. PET scan on 12/21/2011 showed minimal increase in the metabolic  activity associated with a right hilar lymph node and a left  periaortic lymph node. There was no evidence for any new  adenopathy. There were no hypermetabolic retroperitoneal lymph  nodes, or any abnormal activity within the spleen, pelvis or  inguinal lymph nodes. No focal hypermetabolic activity to suggest  skeletal metastasis. 7. Chest x-ray, 2 view, and right shoulder, 2 view, from  01/17/2012 showed an opacity in the right upper lobe suspicious for pneumonia.  There were mild degenerative changes at the right [facility] joint.  The Port-A-Cath tip remained in the mid superior vena cava.  No effusion was seen.  Mediastinal contours appeared normal.   IMPRESSION AND PLAN:  I remain concerned about Verdell's overall condition. He has lost 8 or 9 pounds over the past month, probably related to his pneumonia but the possibly of progression of his underlying Hodgkin lymphoma cannot be excluded.  His symptoms have improved but not resolved.  He is no longer on the Z-Pak.  Of concern today aside from the weight loss is a drop in his hemoglobin and hematocrit with a hemoglobin today of 8.1, hematocrit 25.2, down from 10.2 and 31.4 back on 12/29/2011.  The patient's creatinine also is somewhat elevated to 1.51.  We will go ahead and try to set Ravin up for 2 units of packed red cells in the next few days.  We may want to consider putting him on  Aranesp 300 mcg subcu every couple of weeks.  He will need to go through the consent forms.  I am also going to give him a prescription for prednisone 20 mg to take every other day in the hopes that this might help with his appetite, sense of well being, and perhaps help with his pleuritic chest pain that hopefully will resolve with the resolution of pneumonia.  At some point, we will want to get another chest x-ray or CT scan, especially if symptoms do not resolve.  I spoke with Keith Bell about drinking plenty of fluids, specifically salt-containing fluids and also nutritional supplements.  I should mention that Durante denies any GI symptoms.  He denies any obvious blood or melena regarding his stools.  He is not having any falling at this point, but he generally feels weak.  We will plan to check weekly CBCs.  This these were ordered through the [employer].  We will continue the Afinitor 10 mg every other day.  If it appears that Jamario's disease is progressing, then we may want to consider something like oral VP-16 or perhaps the program call PEPC which I believe involves procarbazine, VP-16, prednisone and either Cytoxan or chlorambucil.  These are all given orally.  Lastly, I asked Binnie if he had a living will.  He does not.  I gave him a package of material going over advanced directives and living will issues.  We touched on the concept of DNR and no code blue, and the futility of heroic measures under these circumstances.  We will need to come to conclusions and finalize these issues.  We may want to consider getting a hospice/palliative care involved if Isrrael's condition continues to deteriorate.  The major obstacle to their participation in Chayton's care, however, has to do with the fact that he is on Afinitor.  Clearly if he is progressing on Afinitor, then we can stop the Afinitor and consider hospice participation in Bryndon's care.  As stated, we will be checking CBCs on a weekly  basis.  I have asked Cheryl to return in 4 weeks which will be around July 9th.  At that time, we will check CBC, chemistries and sedimentation rate.    ______________________________ [name], M.D. DSM/MEDQ  D:  01/24/2012  T:  01/24/2012  Job:  [id number]

## 2012-01-24 NOTE — Telephone Encounter (Signed)
Gv pt appt for june and july2013 

## 2012-01-24 NOTE — Progress Notes (Signed)
This office note has been dictated.  #[id number]

## 2012-01-25 ENCOUNTER — Other Ambulatory Visit: Payer: Self-pay

## 2012-01-25 ENCOUNTER — Telehealth: Payer: Self-pay

## 2012-01-25 ENCOUNTER — Encounter (HOSPITAL_COMMUNITY)
Admission: RE | Admit: 2012-01-25 | Discharge: 2012-01-25 | Disposition: A | Discharge status: Home / Self Care | Payer: Medicare Other | Source: Ambulatory Visit | Attending: Oncology | Admitting: Oncology

## 2012-01-25 DIAGNOSIS — D649 Anemia, unspecified: Secondary | ICD-10-CM

## 2012-01-25 NOTE — Telephone Encounter (Signed)
Called and left message on identified vm for pt re: appt for lab and 1 unit prbc's tomorrow, and 1 unit Friday, and that samples of Afinitor can be picked up from pharmacy tomorrow.

## 2012-01-26 ENCOUNTER — Ambulatory Visit (HOSPITAL_BASED_OUTPATIENT_CLINIC_OR_DEPARTMENT_OTHER): Payer: Medicare Other

## 2012-01-26 ENCOUNTER — Other Ambulatory Visit: Payer: Self-pay | Admitting: Lab

## 2012-01-26 ENCOUNTER — Encounter: Payer: Self-pay | Admitting: Oncology

## 2012-01-26 VITALS — BP 106/69 | HR 66 | Temp 97.7°F | Resp 20

## 2012-01-26 DIAGNOSIS — D649 Anemia, unspecified: Secondary | ICD-10-CM

## 2012-01-26 DIAGNOSIS — C8190 Hodgkin lymphoma, unspecified, unspecified site: Secondary | ICD-10-CM

## 2012-01-26 LAB — CBC WITH DIFFERENTIAL/PLATELET
Basophils Absolute: 0.1 10*3/uL (ref 0.0–0.1)
Eosinophils Absolute: 0 10*3/uL (ref 0.0–0.5)
HGB: 8.1 g/dL — ABNORMAL LOW (ref 13.0–17.1)
LYMPH%: 11.6 % — ABNORMAL LOW (ref 14.0–49.0)
MCV: 84 fL (ref 79.3–98.0)
MONO%: 9.5 % (ref 0.0–14.0)
NEUT#: 3.1 10*3/uL (ref 1.5–6.5)
Platelets: 232 10*3/uL (ref 140–400)

## 2012-01-26 MED ORDER — ACETAMINOPHEN 325 MG PO TABS
650.0000 mg | ORAL_TABLET | Freq: Once | ORAL | Status: AC
  Administered 2012-01-26: 650 mg via ORAL

## 2012-01-26 MED ORDER — SODIUM CHLORIDE 0.9 % IV SOLN
250.0000 mL | Freq: Once | INTRAVENOUS | Status: AC
  Administered 2012-01-26: 250 mL via INTRAVENOUS

## 2012-01-26 MED ORDER — HEPARIN SOD (PORK) LOCK FLUSH 100 UNIT/ML IV SOLN
500.0000 [IU] | Freq: Every day | INTRAVENOUS | Status: AC | PRN
  Administered 2012-01-26: 500 [IU]
  Filled 2012-01-26: qty 5

## 2012-01-26 MED ORDER — SODIUM CHLORIDE 0.9 % IJ SOLN
10.0000 mL | INTRAMUSCULAR | Status: AC | PRN
  Administered 2012-01-26: 10 mL
  Filled 2012-01-26: qty 10

## 2012-01-26 MED ORDER — DIPHENHYDRAMINE HCL 25 MG PO CAPS
25.0000 mg | ORAL_CAPSULE | Freq: Once | ORAL | Status: AC
  Administered 2012-01-26: 25 mg via ORAL

## 2012-01-26 NOTE — Patient Instructions (Signed)
Blood Transfusion Information  WHAT IS A BLOOD TRANSFUSION?  A transfusion is the replacement of blood or some of its parts. Blood is made up of multiple cells which provide different functions.   Red blood cells carry oxygen and are used for blood loss replacement.   White blood cells fight against infection.   Platelets control bleeding.   Plasma helps clot blood.   Other blood products are available for specialized needs, such as hemophilia or other clotting disorders.  BEFORE THE TRANSFUSION   Who gives blood for transfusions?    You may be able to donate blood to be used at a later date on yourself (autologous donation).   Relatives can be asked to donate blood. This is generally not any safer than if you have received blood from a stranger. The same precautions are taken to ensure safety when a relative's blood is donated.   Healthy volunteers who are fully evaluated to make sure their blood is safe. This is blood bank blood.  Transfusion therapy is the safest it has ever been in the practice of medicine. Before blood is taken from a donor, a complete history is taken to make sure that person has no history of diseases nor engages in risky social behavior (examples are intravenous drug use or sexual activity with multiple partners). The donor's travel history is screened to minimize risk of transmitting infections, such as malaria. The donated blood is tested for signs of infectious diseases, such as HIV and hepatitis. The blood is then tested to be sure it is compatible with you in order to minimize the chance of a transfusion reaction. If you or a relative donates blood, this is often done in anticipation of surgery and is not appropriate for emergency situations. It takes many days to process the donated blood.  RISKS AND COMPLICATIONS  Although transfusion therapy is very safe and saves many lives, the main dangers of transfusion include:    Getting an infectious disease.   Developing a  transfusion reaction. This is an allergic reaction to something in the blood you were given. Every precaution is taken to prevent this.  The decision to have a blood transfusion has been considered carefully by your caregiver before blood is given. Blood is not given unless the benefits outweigh the risks.  AFTER THE TRANSFUSION   Right after receiving a blood transfusion, you will usually feel much better and more energetic. This is especially true if your red blood cells have gotten low (anemic). The transfusion raises the level of the red blood cells which carry oxygen, and this usually causes an energy increase.   The nurse administering the transfusion will monitor you carefully for complications.  HOME CARE INSTRUCTIONS   No special instructions are needed after a transfusion. You may find your energy is better. Speak with your caregiver about any limitations on activity for underlying diseases you may have.  SEEK MEDICAL CARE IF:    Your condition is not improving after your transfusion.   You develop redness or irritation at the intravenous (IV) site.  SEEK IMMEDIATE MEDICAL CARE IF:   Any of the following symptoms occur over the next 12 hours:   Shaking chills.   You have a temperature by mouth above 102 F (38.9 C), not controlled by medicine.   Chest, back, or muscle pain.   People around you feel you are not acting correctly or are confused.   Shortness of breath or difficulty breathing.   Dizziness and fainting.     You get a rash or develop hives.   You have a decrease in urine output.   Your urine turns a dark color or changes to pink, red, or brown.  Any of the following symptoms occur over the next 10 days:   You have a temperature by mouth above 102 F (38.9 C), not controlled by medicine.   Shortness of breath.   Weakness after normal activity.   The white part of the eye turns yellow (jaundice).   You have a decrease in the amount of urine or are urinating less often.   Your  urine turns a dark color or changes to pink, red, or brown.  Document Released: 07/29/2000 Document Revised: 07/21/2011 Document Reviewed: 03/17/2008  [employer] Patient Information 2012 [employer]

## 2012-01-26 NOTE — Progress Notes (Signed)
The patient received 1 unit of packed red cells on 01/26/2012 and will receive another unit of packed red cells on 01/27/2012.  Hemoglobin was 8.1 on 6/11 and again on 6/13.  He received samples of afinitor.

## 2012-01-27 ENCOUNTER — Ambulatory Visit (HOSPITAL_BASED_OUTPATIENT_CLINIC_OR_DEPARTMENT_OTHER): Payer: Medicare Other

## 2012-01-27 ENCOUNTER — Other Ambulatory Visit: Payer: Self-pay

## 2012-01-27 VITALS — BP 155/82 | HR 57 | Temp 97.3°F

## 2012-01-27 DIAGNOSIS — D649 Anemia, unspecified: Secondary | ICD-10-CM

## 2012-01-27 DIAGNOSIS — C8190 Hodgkin lymphoma, unspecified, unspecified site: Secondary | ICD-10-CM

## 2012-01-27 MED ORDER — SODIUM CHLORIDE 0.9 % IJ SOLN
10.0000 mL | INTRAMUSCULAR | Status: AC | PRN
  Administered 2012-01-27: 10 mL
  Filled 2012-01-27: qty 10

## 2012-01-27 MED ORDER — SODIUM CHLORIDE 0.9 % IV SOLN
250.0000 mL | Freq: Once | INTRAVENOUS | Status: AC
  Administered 2012-01-27: 250 mL via INTRAVENOUS

## 2012-01-27 MED ORDER — PREDNISONE 20 MG PO TABS: 20.0000 mg | ORAL_TABLET | ORAL | Status: AC

## 2012-01-27 MED ORDER — SODIUM CHLORIDE 0.9 % IJ SOLN
3.0000 mL | INTRAMUSCULAR | Status: DC | PRN
  Filled 2012-01-27: qty 10

## 2012-01-27 MED ORDER — ACETAMINOPHEN 325 MG PO TABS
650.0000 mg | ORAL_TABLET | Freq: Once | ORAL | Status: AC
  Administered 2012-01-27: 650 mg via ORAL

## 2012-01-27 MED ORDER — DIPHENHYDRAMINE HCL 25 MG PO CAPS
25.0000 mg | ORAL_CAPSULE | Freq: Once | ORAL | Status: AC
  Administered 2012-01-27: 25 mg via ORAL

## 2012-01-27 MED ORDER — HEPARIN SOD (PORK) LOCK FLUSH 100 UNIT/ML IV SOLN
500.0000 [IU] | Freq: Every day | INTRAVENOUS | Status: AC | PRN
  Administered 2012-01-27: 500 [IU]
  Filled 2012-01-27: qty 5

## 2012-01-29 LAB — TYPE AND SCREEN: Unit division: 0

## 2012-02-01 ENCOUNTER — Other Ambulatory Visit (HOSPITAL_BASED_OUTPATIENT_CLINIC_OR_DEPARTMENT_OTHER): Payer: Medicare Other | Admitting: Lab

## 2012-02-01 DIAGNOSIS — C8190 Hodgkin lymphoma, unspecified, unspecified site: Secondary | ICD-10-CM

## 2012-02-01 LAB — CBC WITH DIFFERENTIAL/PLATELET
Eosinophils Absolute: 0.7 10*3/uL — ABNORMAL HIGH (ref 0.0–0.5)
HCT: 32 % — ABNORMAL LOW (ref 38.4–49.9)
LYMPH%: 10.9 % — ABNORMAL LOW (ref 14.0–49.0)
MCHC: 33.1 g/dL (ref 32.0–36.0)
MONO#: 1.2 10*3/uL — ABNORMAL HIGH (ref 0.1–0.9)
NEUT#: 4 10*3/uL (ref 1.5–6.5)
NEUT%: 59.8 % (ref 39.0–75.0)
Platelets: 122 10*3/uL — ABNORMAL LOW (ref 140–400)
WBC: 6.8 10*3/uL (ref 4.0–10.3)

## 2012-02-07 ENCOUNTER — Inpatient Hospital Stay (HOSPITAL_COMMUNITY)
Admission: EM | Admit: 2012-02-07 | Discharge: 2012-02-10 | DRG: 194 | Disposition: A | Discharge status: Home / Self Care | Payer: Medicare Other | Attending: Internal Medicine | Admitting: Internal Medicine

## 2012-02-07 ENCOUNTER — Emergency Department (HOSPITAL_COMMUNITY): Payer: Medicare Other

## 2012-02-07 ENCOUNTER — Encounter (HOSPITAL_COMMUNITY): Payer: Self-pay | Admitting: *Deleted

## 2012-02-07 DIAGNOSIS — J159 Unspecified bacterial pneumonia: Secondary | ICD-10-CM

## 2012-02-07 DIAGNOSIS — D631 Anemia in chronic kidney disease: Secondary | ICD-10-CM | POA: Diagnosis present

## 2012-02-07 DIAGNOSIS — R739 Hyperglycemia, unspecified: Secondary | ICD-10-CM

## 2012-02-07 DIAGNOSIS — E2749 Other adrenocortical insufficiency: Secondary | ICD-10-CM

## 2012-02-07 DIAGNOSIS — I10 Essential (primary) hypertension: Secondary | ICD-10-CM

## 2012-02-07 DIAGNOSIS — Z66 Do not resuscitate: Secondary | ICD-10-CM | POA: Diagnosis present

## 2012-02-07 DIAGNOSIS — J438 Other emphysema: Secondary | ICD-10-CM

## 2012-02-07 DIAGNOSIS — I739 Peripheral vascular disease, unspecified: Secondary | ICD-10-CM

## 2012-02-07 DIAGNOSIS — N182 Chronic kidney disease, stage 2 (mild): Secondary | ICD-10-CM | POA: Diagnosis present

## 2012-02-07 DIAGNOSIS — E785 Hyperlipidemia, unspecified: Secondary | ICD-10-CM

## 2012-02-07 DIAGNOSIS — I959 Hypotension, unspecified: Secondary | ICD-10-CM

## 2012-02-07 DIAGNOSIS — N179 Acute kidney failure, unspecified: Secondary | ICD-10-CM

## 2012-02-07 DIAGNOSIS — J4489 Other specified chronic obstructive pulmonary disease: Secondary | ICD-10-CM | POA: Diagnosis present

## 2012-02-07 DIAGNOSIS — C8190 Hodgkin lymphoma, unspecified, unspecified site: Secondary | ICD-10-CM

## 2012-02-07 DIAGNOSIS — E440 Moderate protein-calorie malnutrition: Secondary | ICD-10-CM | POA: Diagnosis present

## 2012-02-07 DIAGNOSIS — J189 Pneumonia, unspecified organism: Secondary | ICD-10-CM

## 2012-02-07 DIAGNOSIS — D509 Iron deficiency anemia, unspecified: Secondary | ICD-10-CM | POA: Diagnosis present

## 2012-02-07 DIAGNOSIS — R7309 Other abnormal glucose: Secondary | ICD-10-CM | POA: Diagnosis present

## 2012-02-07 DIAGNOSIS — D649 Anemia, unspecified: Secondary | ICD-10-CM

## 2012-02-07 DIAGNOSIS — J449 Chronic obstructive pulmonary disease, unspecified: Secondary | ICD-10-CM | POA: Diagnosis present

## 2012-02-07 DIAGNOSIS — I129 Hypertensive chronic kidney disease with stage 1 through stage 4 chronic kidney disease, or unspecified chronic kidney disease: Secondary | ICD-10-CM | POA: Diagnosis present

## 2012-02-07 LAB — COMPREHENSIVE METABOLIC PANEL
ALT: 10 U/L (ref 0–53)
Alkaline Phosphatase: 202 U/L — ABNORMAL HIGH (ref 39–117)
BUN: 35 mg/dL — ABNORMAL HIGH (ref 6–23)
CO2: 19 mEq/L (ref 19–32)
Chloride: 100 mEq/L (ref 96–112)
GFR calc Af Amer: 41 mL/min — ABNORMAL LOW (ref >90)
GFR calc non Af Amer: 35 mL/min — ABNORMAL LOW (ref >90)
Glucose, Bld: 210 mg/dL — ABNORMAL HIGH (ref 70–99)
Potassium: 3.8 mEq/L (ref 3.5–5.1)
Sodium: 134 mEq/L — ABNORMAL LOW (ref 135–145)
Total Bilirubin: 0.3 mg/dL (ref 0.3–1.2)
Total Protein: 7.2 g/dL (ref 6.0–8.3)

## 2012-02-07 LAB — DIFFERENTIAL
Eosinophils Relative: 6 % — ABNORMAL HIGH (ref 0–5)
Lymphs Abs: 0.4 10*3/uL — ABNORMAL LOW (ref 0.7–4.0)
Monocytes Absolute: 0.4 10*3/uL (ref 0.1–1.0)
Monocytes Relative: 6 % (ref 3–12)
Neutro Abs: 5.8 10*3/uL (ref 1.7–7.7)

## 2012-02-07 LAB — CBC
HCT: 28.7 % — ABNORMAL LOW (ref 39.0–52.0)
MCH: 26.3 pg (ref 26.0–34.0)
MCV: 81.3 fL (ref 78.0–100.0)
Platelets: 127 10*3/uL — ABNORMAL LOW (ref 150–400)
RBC: 3.53 MIL/uL — ABNORMAL LOW (ref 4.22–5.81)
WBC: 7 10*3/uL (ref 4.0–10.5)

## 2012-02-07 LAB — URINALYSIS, ROUTINE W REFLEX MICROSCOPIC
Bilirubin Urine: NEGATIVE
Ketones, ur: NEGATIVE mg/dL
Nitrite: NEGATIVE
Protein, ur: >300 mg/dL — AB
pH: 6.5 (ref 5.0–8.0)

## 2012-02-07 LAB — URINE MICROSCOPIC-ADD ON

## 2012-02-07 LAB — LACTIC ACID, PLASMA: Lactic Acid, Venous: 0.9 mmol/L (ref 0.5–2.2)

## 2012-02-07 MED ORDER — PIPERACILLIN-TAZOBACTAM 3.375 G IVPB
3.3750 g | INTRAVENOUS | Status: AC
  Administered 2012-02-07: 3.375 g via INTRAVENOUS
  Filled 2012-02-07: qty 50

## 2012-02-07 MED ORDER — EVEROLIMUS 10 MG PO TABS: 10.0000 mg | ORAL_TABLET | ORAL | Status: DC

## 2012-02-07 MED ORDER — SODIUM CHLORIDE 0.9 % IV SOLN: INTRAVENOUS | Status: AC

## 2012-02-07 MED ORDER — ENOXAPARIN SODIUM 40 MG/0.4ML ~~LOC~~ SOLN
40.0000 mg | Freq: Every day | SUBCUTANEOUS | Status: DC
  Administered 2012-02-08 – 2012-02-09 (×2): 40 mg via SUBCUTANEOUS
  Filled 2012-02-07 (×3): qty 0.4

## 2012-02-07 MED ORDER — ACETAMINOPHEN 325 MG PO TABS
975.0000 mg | ORAL_TABLET | Freq: Once | ORAL | Status: AC
  Administered 2012-02-07: 975 mg via ORAL
  Filled 2012-02-07: qty 3

## 2012-02-07 MED ORDER — VANCOMYCIN HCL 500 MG IV SOLR
500.0000 mg | Freq: Two times a day (BID) | INTRAVENOUS | Status: DC
  Administered 2012-02-08 – 2012-02-09 (×4): 500 mg via INTRAVENOUS
  Filled 2012-02-07 (×5): qty 500

## 2012-02-07 MED ORDER — PREDNISONE 20 MG PO TABS
20.0000 mg | ORAL_TABLET | ORAL | Status: DC
  Administered 2012-02-08 – 2012-02-10 (×2): 20 mg via ORAL
  Filled 2012-02-07 (×2): qty 1

## 2012-02-07 MED ORDER — PANTOPRAZOLE SODIUM 40 MG PO TBEC
40.0000 mg | DELAYED_RELEASE_TABLET | Freq: Every day | ORAL | Status: DC
  Administered 2012-02-08 – 2012-02-10 (×3): 40 mg via ORAL
  Filled 2012-02-07 (×3): qty 1

## 2012-02-07 MED ORDER — HYDROCORTISONE SOD SUCCINATE 100 MG IJ SOLR
50.0000 mg | Freq: Three times a day (TID) | INTRAMUSCULAR | Status: AC
  Administered 2012-02-08 (×3): 50 mg via INTRAVENOUS
  Filled 2012-02-07 (×3): qty 1

## 2012-02-07 MED ORDER — SODIUM CHLORIDE 0.9 % IV BOLUS (SEPSIS)
2000.0000 mL | Freq: Once | INTRAVENOUS | Status: AC
  Administered 2012-02-07: 2000 mL via INTRAVENOUS

## 2012-02-07 MED ORDER — VANCOMYCIN HCL IN DEXTROSE 1-5 GM/200ML-% IV SOLN
1000.0000 mg | INTRAVENOUS | Status: AC
  Administered 2012-02-07: 1000 mg via INTRAVENOUS
  Filled 2012-02-07: qty 200

## 2012-02-07 MED ORDER — PIPERACILLIN-TAZOBACTAM 3.375 G IVPB 30 MIN
3.3750 g | Freq: Three times a day (TID) | INTRAVENOUS | Status: DC
  Administered 2012-02-08 – 2012-02-09 (×4): 3.375 g via INTRAVENOUS
  Filled 2012-02-07 (×4): qty 50

## 2012-02-07 MED ORDER — ONDANSETRON HCL 4 MG/2ML IJ SOLN: 4.0000 mg | Freq: Four times a day (QID) | INTRAMUSCULAR | Status: DC | PRN

## 2012-02-07 MED ORDER — ONDANSETRON HCL 4 MG PO TABS: 4.0000 mg | ORAL_TABLET | Freq: Four times a day (QID) | ORAL | Status: DC | PRN

## 2012-02-07 MED ORDER — POTASSIUM CHLORIDE IN NACL 20-0.9 MEQ/L-% IV SOLN
INTRAVENOUS | Status: DC
  Administered 2012-02-07 – 2012-02-09 (×3): via INTRAVENOUS
  Administered 2012-02-09: 1000 mL via INTRAVENOUS
  Filled 2012-02-07 (×8): qty 1000

## 2012-02-07 MED ORDER — HYDROCODONE-ACETAMINOPHEN 5-325 MG PO TABS
1.0000 | ORAL_TABLET | ORAL | Status: DC | PRN
  Administered 2012-02-09 – 2012-02-10 (×3): 1 via ORAL
  Filled 2012-02-07 (×3): qty 1

## 2012-02-07 NOTE — ED Notes (Addendum)
Per EMS pt in from home c/o generalized weakness, pt hypotensive on EMS arrival. 72/42. Pt also c/o right flank pain worse with deep breath. Pt has stomach cancer.

## 2012-02-07 NOTE — ED Notes (Signed)
Pt made aware that urine sample is needed and pt given urinal

## 2012-02-07 NOTE — ED Notes (Signed)
RN notified for HR,BP, and RR.

## 2012-02-07 NOTE — Progress Notes (Deleted)
[facility] ED CM noted Cm consult from Admission Rn

## 2012-02-07 NOTE — H&P (Signed)
PCP:   Eino Farber, MD   Chief Complaint: Cough, fever and chills   HPI: Keith Bell is an 61 y.o. male with history of stomach cancer, COPD, hypertension, on and off Affinitor due to neutropenia in the past, presents to the emergency room at [facility] long because he was having clear productive cough, feeling subjective fever and a little bit of chills. He does have a right upper chest Port-A-Cath for quite a while without any evidence of infection there. He was recently placed on prednisone 20 mg every other day by Dr. Juluis Pitch to increase his appetite. Evaluation in emergency room included a chest x-ray which show worsening right upper lobe pneumonia. He also has a normal white count of 7K, hemoglobin of 9.3 g per DL, platelet count of 161 7K, and creatinine of 1.96. He was originally hypotensive, but was given 2 L of IV fluids and responded quite nicely. Hospitalist was asked to admit him for hypotension, volume depletion, and pneumonia.  Rewiew of Systems:  The patient denies anorexia, vision loss, decreased hearing, hoarseness, chest pain, syncope, dyspnea on exertion, peripheral edema, balance deficits, hemoptysis, abdominal pain, melena, hematochezia, severe indigestion/heartburn, hematuria, incontinence, genital sores, muscle weakness, suspicious skin lesions, transient blindness, difficulty walking, depression, unusual weight change, abnormal bleeding, enlarged lymph nodes, angioedema, and breast masses.   Past Medical History  Diagnosis Date  . Dyslipidemia 2005  . Cancer     stomach  . Hypertension     No longer on BP pills x 4 years    Past Surgical History  Procedure Date  . No past surgeries     Medications:  HOME MEDS: Prior to Admission medications   Medication Sig Start Date End Date Taking? Authorizing Provider  everolimus (AFINITOR) 10 MG tablet Take 10 mg by mouth every other day. Take 1 po every  Other day   Yes Historical Provider, MD  Guaifenesin  1200 MG TB12 Take 1 tablet (1,200 mg total) by mouth 2 (two) times daily. 01/17/12  Yes Jamesetta Orleans Lawyer, PA-C  HYDROcodone-acetaminophen (VICODIN) 5-500 MG per tablet Take 1 tablet by mouth every 6 (six) hours as needed for pain. 12/02/11  Yes Gerarda Fraction Murinson, MD  ondansetron (ZOFRAN) 8 MG tablet Take 8 mg by mouth every 8 (eight) hours as needed. For nausea   Yes Historical Provider, MD  pantoprazole (PROTONIX) 40 MG tablet Take 1 tablet (40 mg total) by mouth daily. 09/01/11 08/31/12 Yes Gerarda Fraction Murinson, MD  potassium chloride SA (K-DUR,KLOR-CON) 20 MEQ tablet Take 20 mEq by mouth daily.   Yes Historical Provider, MD  prochlorperazine (COMPAZINE) 10 MG tablet Take 10 mg by mouth every 6 (six) hours as needed. For nausea   Yes Historical Provider, MD  predniSONE (DELTASONE) 20 MG tablet Take 1 tablet (20 mg total) by mouth every other day. 01/27/12 02/06/12  [name], MD  prochlorperazine (COMPAZINE) 10 MG tablet Take 1 tablet (10 mg total) by mouth every 6 (six) hours as needed. 06/22/11 06/29/11  [name], MD     Allergies:  Allergies  Allergen Reactions  . Aspirin Nausea Only    Social History:   reports that he has been smoking.  He has never used smokeless tobacco. He reports that he does not drink alcohol or use illicit drugs.  Family History: Family History  Problem Relation Age of Onset  . Heart disease Mother   . Heart disease Father   . Cancer Father   . Cancer Sister   .  Cancer Sister      Physical Exam: Filed Vitals:   02/07/12 1800 02/07/12 1830 02/07/12 1949 02/07/12 2159  BP: 103/66 107/65 102/72 109/73  Pulse: 74 83 83 77  Temp:   97.9 F (36.6 C) 97.6 F (36.4 C)  TempSrc:   Oral Oral  Resp: 22 23 24    SpO2: 90% 94% 97% 98%   Blood pressure 109/73, pulse 77, temperature 97.6 F (36.4 C), temperature source Oral, resp. rate 24, SpO2 98.00%.  GEN:  Pleasant person lying in the stretcher in no acute distress; cooperative with  exam PSYCH:  alert and oriented x4; does not appear anxious or depressed; affect is appropriate. HEENT: Mucous membranes pink and anicteric; PERRLA; EOM intact; no cervical lymphadenopathy nor thyromegaly or carotid bruit; no JVD; Breasts:: Not examined CHEST WALL: No tenderness, Port-A-Cath right upper chest wall with no tenderness or erythema. CHEST: Normal respiration, with coarse rhonchi over the right side. No wheezes or fine crackles HEART: Regular rate and rhythm; no murmurs rubs or gallops BACK: No kyphosis or scoliosis; no CVA tenderness ABDOMEN: soft non-tender; no masses, no organomegaly, normal abdominal bowel sounds; no pannus; no intertriginous candida. Rectal Exam: Not done EXTREMITIES: No bone or joint deformity; age-appropriate arthropathy of the hands and knees; no edema; no ulcerations. Genitalia: not examined PULSES: 2+ and symmetric SKIN: Normal hydration no rash or ulceration CNS: Cranial nerves 2-12 grossly intact no focal lateralizing neurologic deficit   Labs & Imaging Results for orders placed during the hospital encounter of 02/07/12 (from the past 48 hour(s))  LACTIC ACID, PLASMA     Status: Normal   Collection Time   02/07/12  2:38 PM      Component Value Range Comment   Lactic Acid, Venous 0.9  0.5 - 2.2 mmol/L   GLUCOSE, CAPILLARY     Status: Abnormal   Collection Time   02/07/12  3:02 PM      Component Value Range Comment   Glucose-Capillary 199 (*) 70 - 99 mg/dL    Comment 1 Notify RN      Comment 2 Documented in Chart     CBC     Status: Abnormal   Collection Time   02/07/12  3:05 PM      Component Value Range Comment   WBC 7.0  4.0 - 10.5 K/uL    RBC 3.53 (*) 4.22 - 5.81 MIL/uL    Hemoglobin 9.3 (*) 13.0 - 17.0 g/dL    HCT 96.0 (*) 45.4 - 52.0 %    MCV 81.3  78.0 - 100.0 fL    MCH 26.3  26.0 - 34.0 pg    MCHC 32.4  30.0 - 36.0 g/dL    RDW 09.8 (*) 11.9 - 15.5 %    Platelets 127 (*) 150 - 400 K/uL   DIFFERENTIAL     Status: Abnormal    Collection Time   02/07/12  3:05 PM      Component Value Range Comment   Neutrophils Relative 83 (*) 43 - 77 %    Lymphocytes Relative 5 (*) 12 - 46 %    Monocytes Relative 6  3 - 12 %    Eosinophils Relative 6 (*) 0 - 5 %    Basophils Relative 0  0 - 1 %    Neutro Abs 5.8  1.7 - 7.7 K/uL    Lymphs Abs 0.4 (*) 0.7 - 4.0 K/uL    Monocytes Absolute 0.4  0.1 - 1.0 K/uL    Eosinophils  Absolute 0.4  0.0 - 0.7 K/uL    Basophils Absolute 0.0  0.0 - 0.1 K/uL    RBC Morphology POLYCHROMASIA PRESENT      WBC Morphology DOHLE BODIES      Smear Review LARGE PLATELETS PRESENT     COMPREHENSIVE METABOLIC PANEL     Status: Abnormal   Collection Time   02/07/12  3:05 PM      Component Value Range Comment   Sodium 134 (*) 135 - 145 mEq/L    Potassium 3.8  3.5 - 5.1 mEq/L    Chloride 100  96 - 112 mEq/L    CO2 19  19 - 32 mEq/L    Glucose, Bld 210 (*) 70 - 99 mg/dL    BUN 35 (*) 6 - 23 mg/dL    Creatinine, Ser 0.86 (*) 0.50 - 1.35 mg/dL    Calcium 9.7  8.4 - 57.8 mg/dL    Total Protein 7.2  6.0 - 8.3 g/dL    Albumin 2.1 (*) 3.5 - 5.2 g/dL    AST 15  0 - 37 U/L    ALT 10  0 - 53 U/L    Alkaline Phosphatase 202 (*) 39 - 117 U/L    Total Bilirubin 0.3  0.3 - 1.2 mg/dL    GFR calc non Af Amer 35 (*) >90 mL/min    GFR calc Af Amer 41 (*) >90 mL/min   URINALYSIS, ROUTINE W REFLEX MICROSCOPIC     Status: Abnormal   Collection Time   02/07/12  3:18 PM      Component Value Range Comment   Color, Urine YELLOW  YELLOW    APPearance CLOUDY (*) CLEAR    Specific Gravity, Urine 1.023  1.005 - 1.030    pH 6.5  5.0 - 8.0    Glucose, UA 500 (*) NEGATIVE mg/dL    Hgb urine dipstick TRACE (*) NEGATIVE    Bilirubin Urine NEGATIVE  NEGATIVE    Ketones, ur NEGATIVE  NEGATIVE mg/dL    Protein, ur >469 (*) NEGATIVE mg/dL    Urobilinogen, UA 1.0  0.0 - 1.0 mg/dL    Nitrite NEGATIVE  NEGATIVE    Leukocytes, UA NEGATIVE  NEGATIVE   URINE MICROSCOPIC-ADD ON     Status: Abnormal   Collection Time   02/07/12  3:18  PM      Component Value Range Comment   Squamous Epithelial / LPF FEW (*) RARE    WBC, UA 3-6  <3 WBC/hpf    RBC / HPF 0-2  <3 RBC/hpf    Bacteria, UA MANY (*) RARE    Dg Chest Port 1 View  02/07/2012  *RADIOLOGY REPORT*  Clinical Data: Cough, fever, history of gastric cancer  PORTABLE CHEST - 1 VIEW  Comparison: 01/17/2012; 04/29/2011; PET CT - 12/21/2011  Findings: Unchanged cardiac silhouette and mediastinal contours. Stable positioning of support apparatus.  Increased right upper lung heterogeneous air space opacities worrisome for infection. The left hemithorax is unchanged.  No definite pleural effusion.  A skin fold overlies the peripheral aspect of the left upper lung. No definite pneumothorax.  Unchanged bones.  IMPRESSION: Worsening right upper lung pneumonia.  A follow-up chest radiograph in 4 to 6 weeks after treatment is recommended to ensure resolution.  Original Report Authenticated By: Waynard Reeds, M.D.      Assessment Present on Admission:  .PNA (pneumonia) .Hodgkin's lymphoma in relapse .COPD (chronic obstructive pulmonary disease) .HTN (hypertension) .Hypotension   PLAN:  Will admit  him to general medical floor. He was started on vancomycin and Zosyn in the emergency room, and I will continue these antibiotics. He has been on Decadron before for his chemotherapy, and now is on prednisone by mouth every other day, and I wonder if he has some degree of adrenal insufficiency causing hypotension with his pneumonia. Will continue the prednisone, give stress dose Solu-Cortef, and continue IV fluid. He looked much better now. I will continue his other medication. I have contemplated on stopping the Affinitor, but given his normal white count, we'll continue for now. He is otherwise very stable, and will be admitted to general medical floor. I readdress his CODE STATUS, as started by Dr. Juluis Pitch, and he confirmed unequivocally that he would like DO NOT RESUSCITATE. We'll admit  him to [facility] hospitalist service.  He does have elevated creatinine, please follow closely with IV fluid given.   Other plans as per orders.    Emit Kuenzel 02/07/2012, 10:09 PM

## 2012-02-07 NOTE — ED Notes (Signed)
ZOX:[id number]<AV> Expected date:02/07/12<BR> Expected time:<BR> Means of arrival:<BR> Comments:<BR> M70 - 60yom - weakness / flank pain

## 2012-02-07 NOTE — ED Notes (Signed)
Report given to Lauren, RN on 3E 

## 2012-02-07 NOTE — ED Provider Notes (Addendum)
History     CSN: [id number]  Arrival date & time 02/07/12  1430   First MD Initiated Contact with Patient 02/07/12 1457      Chief Complaint  Patient presents with  . Weakness  . Hypotension    (Consider location/radiation/quality/duration/timing/severity/associated sxs/prior treatment) HPI Complains of generalized weakness and shortness of breath onset 2 days ago. Admits to nonproductive cough, other associated symptoms include subjective fever and pain at his Port-A-Cath site at his right chest which is brought on by coughing. Symptoms remain unchanged no treatment prior to coming here no other complaint. Last bowel movement today, normal Past Medical History  Diagnosis Date  . Dyslipidemia 2005  . Cancer     stomach  . Hypertension     No longer on BP pills x 4 years    No past surgical history on file.  Family History  Problem Relation Age of Onset  . Heart disease Mother   . Heart disease Father   . Cancer Father   . Cancer Sister   . Cancer Sister     History  Substance Use Topics  . Smoking status: Current Everyday Smoker -- 1.0 packs/day for 30 years  . Smokeless tobacco: Not on file  . Alcohol Use: No      Review of Systems  Constitutional: Positive for fever and fatigue.  HENT: Negative.   Respiratory: Positive for cough and shortness of breath.   Cardiovascular: Negative.   Gastrointestinal: Negative.   Musculoskeletal: Negative.   Skin: Negative.   Neurological: Positive for weakness.  Hematological: Negative.   Psychiatric/Behavioral: Negative.   All other systems reviewed and are negative.    Allergies  Aspirin  Home Medications   Current Outpatient Rx  Name Route Sig Dispense Refill  . EVEROLIMUS 10 MG PO TABS Oral Take 10 mg by mouth every other day. Take 1 po every  Other day    . GUAIFENESIN ER 1200 MG PO TB12 Oral Take 1 tablet (1,200 mg total) by mouth 2 (two) times daily. 20 each 0  . HYDROCODONE-ACETAMINOPHEN 5-500 MG PO  TABS Oral Take 1 tablet by mouth every 6 (six) hours as needed for pain. 30 tablet 3  . ONE-DAILY MULTI VITAMINS PO TABS Oral Take 1 tablet by mouth daily.    Marland Kitchen ONDANSETRON HCL 8 MG PO TABS Oral Take 8 mg by mouth every 8 (eight) hours as needed. For nausea    . PANTOPRAZOLE SODIUM 40 MG PO TBEC Oral Take 1 tablet (40 mg total) by mouth daily. 30 tablet 6  . POTASSIUM CHLORIDE CRYS ER 20 MEQ PO TBCR Oral Take 20 mEq by mouth daily.    Marland Kitchen PREDNISONE 20 MG PO TABS Oral Take 1 tablet (20 mg total) by mouth every other day. 30 tablet 11  . PROCHLORPERAZINE MALEATE 10 MG PO TABS Oral Take 1 tablet (10 mg total) by mouth every 6 (six) hours as needed. 30 tablet 0  . PROCHLORPERAZINE MALEATE 10 MG PO TABS Oral Take 10 mg by mouth every 6 (six) hours as needed. For nausea      BP 81/59  Pulse 122  Temp 101.5 F (38.6 C) (Rectal)  Resp 25  SpO2 98%  Physical Exam  Nursing note and vitals reviewed. Constitutional: He is oriented to person, place, and time.       Cachectic chronically ill-appearing  HENT:  Head: Normocephalic and atraumatic.       Mucous membranes dry  Eyes: Conjunctivae are normal. Pupils are equal,  round, and reactive to light.  Neck: Neck supple. No tracheal deviation present. No thyromegaly present.  Cardiovascular: Regular rhythm.   No murmur heard.      Tachycardic  Pulmonary/Chest: Effort normal and breath sounds normal.       Port-A-Cath in place right chest, site not red or tender  Abdominal: Soft. Bowel sounds are normal. He exhibits no distension. There is no tenderness.  Genitourinary: Penis normal.  Musculoskeletal: Normal range of motion. He exhibits no edema and no tenderness.  Neurological: He is alert and oriented to person, place, and time. No cranial nerve deficit. Coordination normal.  Skin: Skin is warm and dry. No rash noted.  Psychiatric: He has a normal mood and affect.    ED Course  Procedures (including critical care time)   Labs Reviewed    CBC  DIFFERENTIAL  COMPREHENSIVE METABOLIC PANEL  URINALYSIS, ROUTINE W REFLEX MICROSCOPIC  URINE CULTURE  LACTIC ACID, PLASMA  CULTURE, BLOOD (ROUTINE X 2)  CULTURE, BLOOD (ROUTINE X 2)   No results found.  Chest x-ray is consistent with right upper lobe pneumonia as interpreted by me No diagnosis found.    Date: 02/07/2012  Rate: 115  Rhythm: sinus tachycardia  QRS Axis: normal  Intervals: normal  ST/T Wave abnormalities: normal  Conduction Disutrbances:none  Narrative Interpretation:   Old EKG Reviewed: unchanged Unchanged from 11/19/2010 interpreted by me Results for orders placed during the hospital encounter of 02/07/12  CBC      Component Value Range   WBC 7.0  4.0 - 10.5 K/uL   RBC 3.53 (*) 4.22 - 5.81 MIL/uL   Hemoglobin 9.3 (*) 13.0 - 17.0 g/dL   HCT 19.1 (*) 47.8 - 29.5 %   MCV 81.3  78.0 - 100.0 fL   MCH 26.3  26.0 - 34.0 pg   MCHC 32.4  30.0 - 36.0 g/dL   RDW 62.1 (*) 30.8 - 65.7 %   Platelets 127 (*) 150 - 400 K/uL  DIFFERENTIAL      Component Value Range   Neutrophils Relative 83 (*) 43 - 77 %   Lymphocytes Relative 5 (*) 12 - 46 %   Monocytes Relative 6  3 - 12 %   Eosinophils Relative 6 (*) 0 - 5 %   Basophils Relative 0  0 - 1 %   Neutro Abs 5.8  1.7 - 7.7 K/uL   Lymphs Abs 0.4 (*) 0.7 - 4.0 K/uL   Monocytes Absolute 0.4  0.1 - 1.0 K/uL   Eosinophils Absolute 0.4  0.0 - 0.7 K/uL   Basophils Absolute 0.0  0.0 - 0.1 K/uL   RBC Morphology POLYCHROMASIA PRESENT     WBC Morphology DOHLE BODIES     Smear Review LARGE PLATELETS PRESENT    COMPREHENSIVE METABOLIC PANEL      Component Value Range   Sodium 134 (*) 135 - 145 mEq/L   Potassium 3.8  3.5 - 5.1 mEq/L   Chloride 100  96 - 112 mEq/L   CO2 19  19 - 32 mEq/L   Glucose, Bld 210 (*) 70 - 99 mg/dL   BUN 35 (*) 6 - 23 mg/dL   Creatinine, Ser 8.46 (*) 0.50 - 1.35 mg/dL   Calcium 9.7  8.4 - 96.2 mg/dL   Total Protein 7.2  6.0 - 8.3 g/dL   Albumin 2.1 (*) 3.5 - 5.2 g/dL   AST 15  0 - 37 U/L    ALT 10  0 - 53 U/L  Alkaline Phosphatase 202 (*) 39 - 117 U/L   Total Bilirubin 0.3  0.3 - 1.2 mg/dL   GFR calc non Af Amer 35 (*) >90 mL/min   GFR calc Af Amer 41 (*) >90 mL/min  URINALYSIS, ROUTINE W REFLEX MICROSCOPIC      Component Value Range   Color, Urine YELLOW  YELLOW   APPearance CLOUDY (*) CLEAR   Specific Gravity, Urine 1.023  1.005 - 1.030   pH 6.5  5.0 - 8.0   Glucose, UA 500 (*) NEGATIVE mg/dL   Hgb urine dipstick TRACE (*) NEGATIVE   Bilirubin Urine NEGATIVE  NEGATIVE   Ketones, ur NEGATIVE  NEGATIVE mg/dL   Protein, ur >161 (*) NEGATIVE mg/dL   Urobilinogen, UA 1.0  0.0 - 1.0 mg/dL   Nitrite NEGATIVE  NEGATIVE   Leukocytes, UA NEGATIVE  NEGATIVE  LACTIC ACID, PLASMA      Component Value Range   Lactic Acid, Venous 0.9  0.5 - 2.2 mmol/L  URINE MICROSCOPIC-ADD ON      Component Value Range   Squamous Epithelial / LPF FEW (*) RARE   WBC, UA 3-6  <3 WBC/hpf   RBC / HPF 0-2  <3 RBC/hpf   Bacteria, UA MANY (*) RARE  GLUCOSE, CAPILLARY      Component Value Range   Glucose-Capillary 199 (*) 70 - 99 mg/dL   Comment 1 Notify RN     Comment 2 Documented in Chart     Dg Chest 2 View  01/17/2012  *RADIOLOGY REPORT*  Clinical Data: Pain in the right shoulder, fever, cough, history of Hodgkin's lymphoma  CHEST - 2 VIEW  Comparison: PET CT of 12/21/2011 and chest x-ray of 04/29/2011  Findings: There is parenchymal opacity within the right upper lung field suspicious for pneumonia.  Otherwise the lungs are clear.  No effusion is seen. Mediastinal contours appear normal.  A power port Port-A-Cath remains with the tip in the mid SVC.  Heart size is stable.  No bony abnormality is seen.  IMPRESSION:  1.  Right upper lobe opacity most consistent with pneumonia. 2.  Port-A-Cath tip remains in the mid SVC.  Original Report Authenticated By: Juline Patch, M.D.   Dg Shoulder Right  01/17/2012  *RADIOLOGY REPORT*  Clinical Data: Pain in the right shoulder, fever  RIGHT SHOULDER - 2+  VIEW  Comparison: None.  Findings: There is parenchymal opacity in the right upper lobe most consistent with pneumonia.  The right humeral head is in normal position and the glenohumeral joint space appears normal.  There is mild right AC joint degenerative spurring present.  No acute bony abnormality is seen.  IMPRESSION:  1.  Mild degenerative change at the right [facility] joint. 2.  Opacity in the right upper lobe suspicious for pneumonia.  Original Report Authenticated By: Juline Patch, M.D  .  Today's chest x-ray interpreted by me as new right upper lobe infiltrate consistent with pneumonia  MDM   Spoke with Dr.Le Plan admit medical surgical floor Diagnosis#1 healthcare associated pneumonia #2 sepsis #3 thrombocytopenia      Doug Sou, MD 02/07/12 1939  Doug Sou, MD 02/07/12 0960

## 2012-02-07 NOTE — Progress Notes (Signed)
ANTIBIOTIC CONSULT NOTE - INITIAL  Pharmacy Consult for Vancomycin Indication: pneumonia  Allergies  Allergen Reactions  . Aspirin Nausea Only    Patient Measurements: Height: 5\' 5"  (165.1 cm) Weight: 120 lb (54.432 kg) IBW/kg (Calculated) : 61.5    Vital Signs: Temp: 98.2 F (36.8 C) (06/25 2251) Temp src: Oral (06/25 2251) BP: 99/66 mmHg (06/25 2251) Pulse Rate: 71  (06/25 2251) Intake/Output from previous day:   Intake/Output from this shift: Total I/O In: -  Out: 375 [Urine:375]  Labs:  [facility] 02/07/12 1505  WBC 7.0  HGB 9.3*  PLT 127*  LABCREA --  CREATININE 1.96*   Estimated Creatinine Clearance: 30.8 ml/min (by C-G formula based on Cr of 1.96). No results found for this basename: VANCOTROUGH:2,VANCOPEAK:2,VANCORANDOM:2,GENTTROUGH:2,GENTPEAK:2,GENTRANDOM:2,TOBRATROUGH:2,TOBRAPEAK:2,TOBRARND:2,AMIKACINPEAK:2,AMIKACINTROU:2,AMIKACIN:2, in the last 72 hours   Microbiology: Recent Results (from the past 720 hour(s))  TECHNOLOGIST REVIEW     Status: Normal   Collection Time   01/24/12  3:53 PM      Component Value Range Status Comment   Technologist Review Rare meta, large and giant plts   Final   TECHNOLOGIST REVIEW     Status: Normal   Collection Time   01/26/12  8:18 AM      Component Value Range Status Comment   Technologist Review Occ Metas and Myelocytes present, rouleaux   Final   TECHNOLOGIST REVIEW     Status: Normal   Collection Time   02/01/12  7:45 AM      Component Value Range Status Comment   Technologist Review Metas and Myelocytes present   Final     Medical History: Past Medical History  Diagnosis Date  . Dyslipidemia 2005  . Cancer     stomach  . Hypertension     No longer on BP pills x 4 years    Medications:  Scheduled:    . sodium chloride   Intravenous STAT  . acetaminophen  975 mg Oral Once  . enoxaparin  40 mg Subcutaneous QHS  . everolimus  10 mg Oral QODAY  . hydrocortisone sod succinate (SOLU-CORTEF) injection  50  mg Intravenous Q8H  . pantoprazole  40 mg Oral Daily  . piperacillin-tazobactam (ZOSYN)  IV  3.375 g Intravenous To ER  . piperacillin-tazobactam  3.375 g Intravenous Q8H  . predniSONE  20 mg Oral QODAY  . sodium chloride  2,000 mL Intravenous Once  . vancomycin  1,000 mg Intravenous To ER   Infusions:    . 0.9 % NaCl with KCl 20 mEq / L     Assessment: 61 yo with history of stomach cancer admitted with cough, fever and chills.  MD ordering Vancomycin for suspected PNA.  Goal of Therapy:  Vancomycin trough level 15-20 mcg/ml  Plan:   Vancomycin 500mg  IV q12h.  CrCl~40 (N)  F/u SCr/levels as needed.  Susanne Greenhouse R 02/07/2012,11:24 PM

## 2012-02-07 NOTE — Progress Notes (Signed)
[facility] ED CM noted CM consult from Admission RN related trouble with obtaining medications CM spoke with pt who confirms he is Medicaid but reports that each month his medicaid is inactive when he attempts to purchase medications from his local pharmacy This causes him to have to pay full price for his medications.  CM referred pt back to DSS to correct his medicaid CM and pt agreed financial counselor consult 24 hrs after admission.  CM reviewed and provided written resources for needymeds[employer] (toll free number) discounted pharmacies including [facility] outpatient pharmacies, financial assistance resources and DSS contact information Pt voices understanding and appreciation of services

## 2012-02-08 ENCOUNTER — Other Ambulatory Visit: Payer: Self-pay | Admitting: Lab

## 2012-02-08 DIAGNOSIS — R739 Hyperglycemia, unspecified: Secondary | ICD-10-CM | POA: Diagnosis present

## 2012-02-08 DIAGNOSIS — E2749 Other adrenocortical insufficiency: Secondary | ICD-10-CM

## 2012-02-08 DIAGNOSIS — I959 Hypotension, unspecified: Secondary | ICD-10-CM

## 2012-02-08 DIAGNOSIS — N189 Chronic kidney disease, unspecified: Secondary | ICD-10-CM | POA: Diagnosis present

## 2012-02-08 DIAGNOSIS — J159 Unspecified bacterial pneumonia: Secondary | ICD-10-CM

## 2012-02-08 DIAGNOSIS — D509 Iron deficiency anemia, unspecified: Secondary | ICD-10-CM

## 2012-02-08 DIAGNOSIS — J438 Other emphysema: Secondary | ICD-10-CM

## 2012-02-08 DIAGNOSIS — N179 Acute kidney failure, unspecified: Secondary | ICD-10-CM | POA: Diagnosis present

## 2012-02-08 HISTORY — DX: Iron deficiency anemia, unspecified: D50.9

## 2012-02-08 LAB — BASIC METABOLIC PANEL
CO2: 18 mEq/L — ABNORMAL LOW (ref 19–32)
Chloride: 108 mEq/L (ref 96–112)
GFR calc Af Amer: 48 mL/min — ABNORMAL LOW (ref >90)
Potassium: 3.5 mEq/L (ref 3.5–5.1)

## 2012-02-08 LAB — GLUCOSE, CAPILLARY
Glucose-Capillary: 194 mg/dL — ABNORMAL HIGH (ref 70–99)
Glucose-Capillary: 160 mg/dL — ABNORMAL HIGH (ref 70–99)
Glucose-Capillary: 140 mg/dL — ABNORMAL HIGH (ref 70–99)

## 2012-02-08 LAB — MRSA PCR SCREENING: MRSA by PCR: NEGATIVE

## 2012-02-08 LAB — CBC
HCT: 23.3 % — ABNORMAL LOW (ref 39.0–52.0)
Hemoglobin: 7.7 g/dL — ABNORMAL LOW (ref 13.0–17.0)
MCV: 80.9 fL (ref 78.0–100.0)
RBC: 2.88 MIL/uL — ABNORMAL LOW (ref 4.22–5.81)
WBC: 7 10*3/uL (ref 4.0–10.5)

## 2012-02-08 LAB — HEMOGLOBIN A1C
Hgb A1c MFr Bld: 6.9 % — ABNORMAL HIGH (ref <5.7)
Mean Plasma Glucose: 151 mg/dL — ABNORMAL HIGH (ref <117)

## 2012-02-08 LAB — URINE CULTURE: Culture  Setup Time: 201306252210

## 2012-02-08 MED ORDER — GUAIFENESIN-DM 100-10 MG/5ML PO SYRP
10.0000 mL | ORAL_SOLUTION | ORAL | Status: DC | PRN
  Administered 2012-02-08 – 2012-02-10 (×5): 10 mL via ORAL
  Filled 2012-02-08 (×5): qty 10

## 2012-02-08 MED ORDER — ENSURE COMPLETE PO LIQD
237.0000 mL | Freq: Two times a day (BID) | ORAL | Status: DC
  Administered 2012-02-09 (×2): 237 mL via ORAL

## 2012-02-08 MED ORDER — INSULIN ASPART 100 UNIT/ML ~~LOC~~ SOLN
0.0000 [IU] | SUBCUTANEOUS | Status: DC
  Administered 2012-02-08 (×2): 2 [IU] via SUBCUTANEOUS
  Administered 2012-02-08: 1 [IU] via SUBCUTANEOUS
  Administered 2012-02-08: 2 [IU] via SUBCUTANEOUS
  Administered 2012-02-09: 1 [IU] via SUBCUTANEOUS
  Administered 2012-02-09 (×3): 2 [IU] via SUBCUTANEOUS
  Administered 2012-02-10: 5 [IU] via SUBCUTANEOUS

## 2012-02-08 NOTE — Progress Notes (Signed)
Care notes re; Pneumonia rendered to patient.

## 2012-02-08 NOTE — Progress Notes (Addendum)
Patient ID: Keith Bell, male   DOB: 09/02/1950, 61 y.o.   MRN: [id number]  [facility] PROGRESS NOTE  Keith Bell:[id number] DOB: July 30, 1951 DOA: 02/07/2012 PCP: Eino Farber, MD  Brief narrative: Pt 61 y.o. male with history of stomach cancer, COPD, hypertension, on and off [employer] due to neutropenia in the past, admitted 02/07/2012 to [facility] due to progressively worsening productive cough, fevers, chills, and is currently being treated for Right upper lobe PNA. Please note that pt has a right upper chest Port-A-Cath for quite a while without any evidence of infection there. He was recently placed on prednisone 20 mg every other day by Dr. Arline Asp to increase his appetite.   Assessment/Plan:  Principal Problem:  *PNA (pneumonia) - pt is clinically improving and is maintaining oxygen saturations > 97% on 2 L  - will continue broad spectrum antibiotics now, Vancomycin and Zosyn (day #2) - continue to monitor vitals per floor protocol  Active Problems:  Hodgkin's lymphoma in relapse - will notify primary oncologist of pt's admission   COPD (chronic obstructive pulmonary disease) - this appears to be sable at this point - will continue supportive management for now - pt is maintaining oxygen saturations > 97%   HTN (hypertension) - stable - will continue to monitor vitals per floor protocol   Hyperlipidemia - will continue statin   Hypotension - now stable but somewhat on soft side - will monitor  Anemia of chronic disease - Hg drop since yesterday - will proceed with transfusion, 1 unit PRBC's  Acute on chronic renal failure, stage II - creatinine trending down - BMP in AM  Hyperglycemia - secondary to steroid use - will check A1C and continue SSI for now  Code Status: DNR Family Communication: Pt at bedside Disposition Plan: PT evaluation  Manson Passey, MD  [facility] Pager [phone]  If 7PM-7AM, please contact  night-coverage [email or web address] Password [facility] 02/08/2012, 9:27 AM   LOS: 1 day   Consultants:  None  Procedures:  None  Antibiotics:  Vancomycin 02/07/2012 -->  Zosyn 02/07/2012 -->  HPI/Subjective: No events overnight.  Objective: Filed Vitals:   02/07/12 1949 02/07/12 2159 02/07/12 2251 02/08/12 0551  BP: 102/72 109/73 99/66 91/61   Pulse: 83 77 71 82  Temp: 97.9 F (36.6 C) 97.6 F (36.4 C) 98.2 F (36.8 C) 97.5 F (36.4 C)  TempSrc: Oral Oral Oral Oral  Resp: 24   20  Height:   5\' 5"  (1.651 m)   Weight:   120 lb (54.432 kg)   SpO2: 97% 98% 97% 97%    Intake/Output Summary (Last 24 hours) at 02/08/12 0927 Last data filed at 02/08/12 0559  Gross per 24 hour  Intake   1800 ml  Output   1225 ml  Net    575 ml    Exam:   General:  Pt is alert, follows commands appropriately, not in acute distress  Cardiovascular: Regular rate and rhythm, S1/S2, no murmurs, no rubs, no gallops  Respiratory: Clear to auscultation bilaterally but decreased at bases, no wheezing, no crackles, no rhonchi  Abdomen: Soft, non tender, non distended, bowel sounds present, no guarding  Extremities: No edema, pulses DP and PT palpable bilaterally  Neuro: Grossly nonfocal  Data Reviewed: Basic Metabolic Panel:  Lab 02/08/12 8657 02/07/12 1505  NA 138 134*  K 3.5 3.8  CL 108 100  CO2 18* 19  GLUCOSE 226* 210*  BUN 30* 35*  CREATININE 1.73* 1.96*  CALCIUM 8.8  9.7  MG -- --  PHOS -- --   Liver Function Tests:  Lab 02/07/12 1505  AST 15  ALT 10  ALKPHOS 202*  BILITOT 0.3  PROT 7.2  ALBUMIN 2.1*   CBC:  Lab 02/08/12 0600 02/07/12 1505  WBC 7.0 7.0  NEUTROABS -- 5.8  HGB 7.7* 9.3*  HCT 23.3* 28.7*  MCV 80.9 81.3  PLT 105* 127*   CBG:  Lab 02/07/12 1502  GLUCAP 199*    Recent Results (from the past 240 hour(s))  TECHNOLOGIST REVIEW     Status: Normal   Collection Time   02/01/12  7:45 AM      Component Value Range Status Comment   Technologist Review  Metas and Myelocytes present   Final   MRSA PCR SCREENING     Status: Normal   Collection Time   02/08/12 12:52 AM      Component Value Range Status Comment   MRSA by PCR NEGATIVE  NEGATIVE Final      Studies:  Dg Chest Port 1 View 02/07/2012    IMPRESSION:  Worsening right upper lung pneumonia.   A follow-up chest radiograph in 4 to 6 weeks after treatment is recommended to ensure resolution.   Scheduled Meds:   . acetaminophen  975 mg Oral Once  . enoxaparin  40 mg Subcutaneous QHS  .  SOLU-CORTEF  50 mg Intravenous Q8H  . pantoprazole  40 mg Oral Daily  . piperacillin-tazobactam  3.375 g Intravenous Q8H  . predniSONE  20 mg Oral QODAY  . sodium chloride  2,000 mL Intravenous Once  . vancomycin  500 mg Intravenous Q12H   Continuous Infusions:   . 0.9 % NaCl with KCl 20 mEq / L 75 mL/hr at 02/07/12 2334

## 2012-02-08 NOTE — Progress Notes (Signed)
INITIAL ADULT NUTRITION ASSESSMENT Date: 02/08/2012   Time: 1:54 PM Reason for Assessment: Nutrition risk unintentional weight loss  ASSESSMENT: Male 61 y.o.  Dx: PNA (pneumonia)  Hx:  Past Medical History  Diagnosis Date  . Dyslipidemia 2005  . Cancer     stomach  . Hypertension     No longer on BP pills x 4 years    Related Meds:  Scheduled Meds:   . sodium chloride   Intravenous STAT  . acetaminophen  975 mg Oral Once  . enoxaparin  40 mg Subcutaneous QHS  . hydrocortisone sod succinate (SOLU-CORTEF) injection  50 mg Intravenous Q8H  . insulin aspart  0-9 Units Subcutaneous Q4H  . pantoprazole  40 mg Oral Daily  . piperacillin-tazobactam (ZOSYN)  IV  3.375 g Intravenous To ER  . piperacillin-tazobactam  3.375 g Intravenous Q8H  . predniSONE  20 mg Oral QODAY  . sodium chloride  2,000 mL Intravenous Once  . vancomycin  500 mg Intravenous Q12H  . vancomycin  1,000 mg Intravenous To ER  . DISCONTD: everolimus  10 mg Oral QODAY   Continuous Infusions:   . 0.9 % NaCl with KCl 20 mEq / L 75 mL/hr at 02/07/12 2334   PRN Meds:.HYDROcodone-acetaminophen, ondansetron (ZOFRAN) IV, ondansetron   Ht: 5\' 5"  (165.1 cm)  Wt: 120 lb (54.432 kg)  Ideal Wt: 61.8 kg % Ideal Wt: 88.2% Wt Readings from Last 10 Encounters:  02/07/12 120 lb (54.432 kg)  01/24/12 122 lb 11.2 oz (55.656 kg)  01/17/12 131 lb (59.421 kg)  12/29/11 131 lb 1.6 oz (59.467 kg)  11/23/11 129 lb 9.6 oz (58.786 kg)  10/19/11 128 lb 12.8 oz (58.423 kg)  09/20/11 125 lb 14.4 oz (57.108 kg)  09/01/11 128 lb 1.6 oz (58.106 kg)  08/18/11 127 lb 6.4 oz (57.788 kg)  07/18/11 132 lb 4.8 oz (60.011 kg)    Body mass index is 19.97 kg/(m^2). (WNL)  Food/Nutrition Related Hx: Patient appears thin. He reported difficulty chewing due to no teeth. He reported his appetite and intake were poor for the past week. He reported a PO intake of <50% of meals over the last week. Today he ate well at lunch meal, PO intake  75%.   Labs:  CMP     Component Value Date/Time   NA 138 02/08/2012 0600   K 3.5 02/08/2012 0600   CL 108 02/08/2012 0600   CO2 18* 02/08/2012 0600   GLUCOSE 226* 02/08/2012 0600   BUN 30* 02/08/2012 0600   CREATININE 1.73* 02/08/2012 0600   CALCIUM 8.8 02/08/2012 0600   PROT 7.2 02/07/2012 1505   ALBUMIN 2.1* 02/07/2012 1505   AST 15 02/07/2012 1505   ALT 10 02/07/2012 1505   ALKPHOS 202* 02/07/2012 1505   BILITOT 0.3 02/07/2012 1505   GFRNONAA 41* 02/08/2012 0600   GFRAA 48* 02/08/2012 0600     Intake/Output Summary (Last 24 hours) at 02/08/12 1355 Last data filed at 02/08/12 1334  Gross per 24 hour  Intake 2971.25 ml  Output   1475 ml  Net 1496.25 ml     Diet Order: Cardiac  Supplements/Tube Feeding: none at this time  IVF:    0.9 % NaCl with KCl 20 mEq / L Last Rate: 75 mL/hr at 02/07/12 2334    Estimated Nutritional Needs:   Kcal: 1610-9604 Protein: 74-86 grams Fluid: 1 ml per kcal intake  NUTRITION DIAGNOSIS: -Inadequate oral intake (NI-2.1).  Status: Ongoing  RELATED TO: poor appetite  AS EVIDENCE  BY: patient reported poor PO intake over the past week of < 50% of meals.   MONITORING/EVALUATION(Goals): PO intake, weights, labs, I/O's, chewing ability.  1. PO intake > 75% of meals and supplements. 2. Ease of chewing upon down grade of diet.   EDUCATION NEEDS: -No education needs identified at this time  INTERVENTION: 1. Will order patient Ensure nutrition supplement BID. Provides 500 kcal and 18 grams of protein.  2. Will modify patient's diet to chopped for ease of chewing.  3. RD to follow for nutrition plan of care.   Dietitian [phone]  DOCUMENTATION CODES Per approved criteria  -Severe malnutrition in the context of acute illness or injury  *Patient meets malnutrition criteria due to PO intake < 50 % of estimated energy needs over the past 5 days and apparent moderate wasting of both muscle mass and body fat.   Iven Finn  [facility] 02/08/2012, 1:54 PM

## 2012-02-09 ENCOUNTER — Encounter (HOSPITAL_COMMUNITY): Payer: Self-pay | Admitting: *Deleted

## 2012-02-09 DIAGNOSIS — E2749 Other adrenocortical insufficiency: Secondary | ICD-10-CM

## 2012-02-09 DIAGNOSIS — J159 Unspecified bacterial pneumonia: Secondary | ICD-10-CM

## 2012-02-09 DIAGNOSIS — J438 Other emphysema: Secondary | ICD-10-CM

## 2012-02-09 DIAGNOSIS — I959 Hypotension, unspecified: Secondary | ICD-10-CM

## 2012-02-09 LAB — BASIC METABOLIC PANEL
CO2: 17 mEq/L — ABNORMAL LOW (ref 19–32)
Chloride: 109 mEq/L (ref 96–112)
Creatinine, Ser: 1.64 mg/dL — ABNORMAL HIGH (ref 0.50–1.35)
GFR calc Af Amer: 51 mL/min — ABNORMAL LOW (ref >90)
Sodium: 138 mEq/L (ref 135–145)

## 2012-02-09 LAB — GLUCOSE, CAPILLARY
Glucose-Capillary: 166 mg/dL — ABNORMAL HIGH (ref 70–99)
Glucose-Capillary: 169 mg/dL — ABNORMAL HIGH (ref 70–99)
Glucose-Capillary: 167 mg/dL — ABNORMAL HIGH (ref 70–99)
Glucose-Capillary: 129 mg/dL — ABNORMAL HIGH (ref 70–99)

## 2012-02-09 LAB — CBC
Platelets: 93 10*3/uL — ABNORMAL LOW (ref 150–400)
RBC: 3.15 MIL/uL — ABNORMAL LOW (ref 4.22–5.81)
RDW: 19.5 % — ABNORMAL HIGH (ref 11.5–15.5)
WBC: 8.7 10*3/uL (ref 4.0–10.5)

## 2012-02-09 LAB — VANCOMYCIN, TROUGH: Vancomycin Tr: 20.7 ug/mL — ABNORMAL HIGH (ref 10.0–20.0)

## 2012-02-09 MED ORDER — POTASSIUM CHLORIDE CRYS ER 20 MEQ PO TBCR
40.0000 meq | EXTENDED_RELEASE_TABLET | Freq: Once | ORAL | Status: AC
  Administered 2012-02-09: 40 meq via ORAL
  Filled 2012-02-09: qty 2

## 2012-02-09 MED ORDER — PIPERACILLIN-TAZOBACTAM 3.375 G IVPB
3.3750 g | Freq: Three times a day (TID) | INTRAVENOUS | Status: DC
  Administered 2012-02-09 – 2012-02-10 (×3): 3.375 g via INTRAVENOUS
  Filled 2012-02-09 (×6): qty 50

## 2012-02-09 MED ORDER — VANCOMYCIN HCL 1000 MG IV SOLR
750.0000 mg | INTRAVENOUS | Status: DC
  Filled 2012-02-09: qty 750

## 2012-02-09 NOTE — Progress Notes (Signed)
ANTIBIOTIC CONSULT NOTE - FOLLOW UP  Pharmacy Consult for Vancomycin  Indication: pneumonia  Allergies  Allergen Reactions  . Aspirin Nausea Only    Patient Measurements: Height: 5\' 5"  (165.1 cm) Weight: 120 lb (54.432 kg) IBW/kg (Calculated) : 61.5  Adjusted Body Weight:   Vital Signs: Temp: 98.6 F (37 C) (06/27 1351) Temp src: Oral (06/27 1351) BP: 123/73 mmHg (06/27 1351) Pulse Rate: 77  (06/27 1351) Intake/Output from previous day: 06/26 0701 - 06/27 0700 In: 3547.7 [P.O.:1180; I.V.:1990.8; Blood:12.5; IV Piggyback:364.4] Out: 1750 [Urine:1750] Intake/Output from this shift:    Labs:  Basename 02/09/12 0620 02/08/12 0600 02/07/12 1505  WBC 8.7 7.0 7.0  HGB 8.7* 7.7* 9.3*  PLT 93* 105* 127*  LABCREA -- -- --  CREATININE 1.64* 1.73* 1.96*   Estimated Creatinine Clearance: 36.9 ml/min (by C-G formula based on Cr of 1.64).  Basename 02/09/12 2015  VANCOTROUGH 20.7*  VANCOPEAK --  Drue Dun --  GENTTROUGH --  GENTPEAK --  GENTRANDOM --  TOBRATROUGH --  TOBRAPEAK --  TOBRARND --  AMIKACINPEAK --  AMIKACINTROU --  AMIKACIN --     Microbiology: Recent Results (from the past 720 hour(s))  TECHNOLOGIST REVIEW     Status: Normal   Collection Time   01/24/12  3:53 PM      Component Value Range Status Comment   Technologist Review Rare meta, large and giant plts   Final   TECHNOLOGIST REVIEW     Status: Normal   Collection Time   01/26/12  8:18 AM      Component Value Range Status Comment   Technologist Review Occ Metas and Myelocytes present, rouleaux   Final   TECHNOLOGIST REVIEW     Status: Normal   Collection Time   02/01/12  7:45 AM      Component Value Range Status Comment   Technologist Review Metas and Myelocytes present   Final   URINE CULTURE     Status: Normal   Collection Time   02/07/12  3:18 PM      Component Value Range Status Comment   Specimen Description URINE, CLEAN CATCH   Final    Special Requests NONE   Final    Culture  Setup  Time [id number]   Final    Colony Count [id number]   Final    Culture     Final    Value: Multiple bacterial morphotypes present, none predominant. Suggest appropriate recollection if clinically indicated.   Report Status 02/08/2012 FINAL   Final   CULTURE, BLOOD (ROUTINE X 2)     Status: Normal (Preliminary result)   Collection Time   02/07/12  3:37 PM      Component Value Range Status Comment   Specimen Description BLOOD LEFT ANTECUBITAL   Final    Special Requests BOTTLES DRAWN AEROBIC AND ANAEROBIC 5 CC EACH   Final    Culture  Setup Time [id number]   Final    Culture     Final    Value:        BLOOD CULTURE RECEIVED NO GROWTH TO DATE CULTURE WILL BE HELD FOR 5 DAYS BEFORE ISSUING A FINAL NEGATIVE REPORT   Report Status PENDING   Incomplete   CULTURE, BLOOD (ROUTINE X 2)     Status: Normal (Preliminary result)   Collection Time   02/07/12  4:19 PM      Component Value Range Status Comment   Specimen Description BLOOD PORTA CATH   Final    Special  Requests BOTTLES DRAWN AEROBIC AND ANAEROBIC [facility]   Final    Culture  Setup Time [id number]   Final    Culture     Final    Value:        BLOOD CULTURE RECEIVED NO GROWTH TO DATE CULTURE WILL BE HELD FOR 5 DAYS BEFORE ISSUING A FINAL NEGATIVE REPORT   Report Status PENDING   Incomplete   MRSA PCR SCREENING     Status: Normal   Collection Time   02/08/12 12:52 AM      Component Value Range Status Comment   MRSA by PCR NEGATIVE  NEGATIVE Final     Anti-infectives     Start     Dose/Rate Route Frequency Ordered Stop   02/09/12 0430  piperacillin-tazobactam (ZOSYN) IVPB 3.375 g       3.375 g 12.5 mL/hr over 240 Minutes Intravenous 3 times per day 02/09/12 0424     02/08/12 0800   vancomycin (VANCOCIN) 500 mg in sodium chloride 0.9 % 100 mL IVPB        500 mg 100 mL/hr over 60 Minutes Intravenous Every 12 hours 02/07/12 2331     02/08/12 0400   piperacillin-tazobactam (ZOSYN) IVPB 3.375 g  Status:  Discontinued        3.375  g 100 mL/hr over 30 Minutes Intravenous Every 8 hours 02/07/12 2249 02/09/12 0425   02/07/12 1930   vancomycin (VANCOCIN) IVPB 1000 mg/200 mL premix        1,000 mg 200 mL/hr over 60 Minutes Intravenous To Emergency Dept 02/07/12 1843 02/07/12 2137   02/07/12 1930  piperacillin-tazobactam (ZOSYN) IVPB 3.375 g       3.375 g 100 mL/hr over 30 Minutes Intravenous To Emergency Dept 02/07/12 1843 02/07/12 2018          Assessment: 61 yo M with hx of stomach cancer admitted with cough, fever,and chills. Vancomycin for suspected PNA Vanc trough= 20.7 drawn at 2015  Goal of Therapy:  Vancomycin trough level 15-20 mcg/ml  Plan:   Follow up culture results  Change Vancomycin to 750mg  iv  24 hours to aim for acceptable trough level  Follow renal function/steady state levels on new dosing regimen   Keith Bell 02/09/2012,9:28 PM

## 2012-02-09 NOTE — Evaluation (Signed)
Physical Therapy Evaluation Patient Details Name: OSLO HUNTSMAN MRN: [id number] DOB: [birth year] Today's Date: 02/09/2012 Time: 4098-1191 PT Time Calculation (min): 17 min  PT Assessment / Plan / Recommendation Clinical Impression  61 y.o. male admitted with PNA. SaO2 99% on RA with walking, HR 84.  Pt independent with mobility, no further PT needed. Encouraged pt to ambulate in halls 2-3x/day. NO follow up PT nor DME needed. Pt states family can assist at home prn.    PT Assessment  Patent does not need any further PT services    Follow Up Recommendations  No PT follow up    Barriers to Discharge        Equipment Recommendations  None recommended by PT    Recommendations for Other Services     Frequency      Precautions / Restrictions Restrictions Weight Bearing Restrictions: No   Pertinent Vitals/Pain **no c/o pain HR 84 with walking, SaO2 99% on RA with walking*      Mobility  Bed Mobility Bed Mobility: Supine to Sit Supine to Sit: 7: Independent Transfers Transfers: Sit to Stand;Stand to Sit Sit to Stand: 7: Independent;From bed Stand to Sit: 7: Independent;To chair/3-in-1 Ambulation/Gait Ambulation/Gait Assistance: 7: Independent Ambulation Distance (Feet): 350 Feet Assistive device: None Ambulation/Gait Assistance Details: SaO2 99% with walking, HR 84, no LOB Gait Pattern: Within Functional Limits    Exercises     PT Diagnosis:    PT Problem List:   PT Treatment Interventions:     PT Goals    Visit Information  Last PT Received On: 02/09/12 Assistance Needed: +1    Subjective Data  Subjective: I feel a little weak. I walked around in the room yesterday and did fine. Patient Stated Goal: none reported   Prior Functioning  Home Living Lives With: Alone Available Help at Discharge: Family Type of Home: Mobile home Home Access: Stairs to enter Entrance Stairs-Number of Steps: 3 Entrance Stairs-Rails: Right Home Layout: One level Home Adaptive  Equipment: Straight cane Prior Function Level of Independence: Independent Able to Take Stairs?: Yes Driving: No Communication Communication: No difficulties    Cognition  Overall Cognitive Status: Appears within functional limits for tasks assessed/performed Arousal/Alertness: Awake/alert Orientation Level: Appears intact for tasks assessed Behavior During Session: [facility] for tasks performed    Extremity/Trunk Assessment Right Upper Extremity Assessment RUE ROM/Strength/Tone: [facility] for tasks assessed Left Upper Extremity Assessment LUE ROM/Strength/Tone: WFL for tasks assessed Right Lower Extremity Assessment RLE ROM/Strength/Tone: Within functional levels RLE Sensation: WFL - Light Touch RLE Coordination: WFL - gross/fine motor Left Lower Extremity Assessment LLE ROM/Strength/Tone: Within functional levels LLE Sensation: WFL - Light Touch LLE Coordination: WFL - gross/fine motor Trunk Assessment Trunk Assessment: Normal   Balance    End of Session PT - End of Session Equipment Utilized During Treatment: Gait belt Patient left: in chair Nurse Communication: Mobility status  GP     Ralene Bathe Kistler 02/09/2012, 12:10 PM [phone]

## 2012-02-09 NOTE — Progress Notes (Signed)
[facility] PROGRESS NOTE  Keith Bell:[id number] DOB: 1951/03/10 DOA: 02/07/2012 PCP: Eino Farber, MD  Brief narrative:  Pt 61 y.o. male with history of stomach cancer, COPD, hypertension, on and off [employer] due to neutropenia in the past, admitted 02/07/2012 to [facility] due to progressively worsening productive cough, fevers, chills, and is currently being treated for Right upper lobe PNA. Please note that pt has a right upper chest Port-A-Cath for quite a while without any evidence of infection there. He was recently placed on prednisone 20 mg every other day by Dr. Arline Asp to increase his appetite.   Assessment/Plan:   Principal Problem:  *PNA (pneumonia)  - pt is clinically improving and is maintaining oxygen saturations > 97% on 2 L [place]  - will continue broad spectrum antibiotics now, Vancomycin and Zosyn (day #2)  - continue to monitor vitals per floor protocol   Active Problems:  Hodgkin's lymphoma in relapse  - per oncology this is an advanced cancer and patient is not likely candidate for further treatment  COPD (chronic obstructive pulmonary disease)  - this appears to be sable at this point  - will continue supportive management for now  - pt is maintaining oxygen saturations > 97%   HTN (hypertension)  - stable  - will continue to monitor vitals per floor protocol   Hyperlipidemia  - will continue statin   Hypotension  - now stable but somewhat on soft side  - will monitor   Anemia of chronic disease  - Hg stable status post 1 unit PRBC transfusion  Acute on chronic renal failure, stage II  - creatinine trending down    Hyperglycemia  - secondary to steroid use  - will check A1C and continue SSI for now   Code Status: DNR  Family Communication: Pt at bedside  Disposition Plan: PT evaluation   Manson Passey, MD  [facility] Pager [phone]  If 7PM-7AM, please contact night-coverage [email or web address] Password  [email or web address] 02/09/2012, 1:37 PM   LOS: 2 days   HPI/Subjective: No acute events  Objective: Filed Vitals:   02/08/12 1950 02/08/12 2100 02/09/12 0500 02/09/12 1205  BP:  116/75 102/64   Pulse: 78 75 90 84  Temp:  98.1 F (36.7 C) 98.1 F (36.7 C)   TempSrc:  Oral Oral   Resp:  18 18   Height:      Weight:      SpO2:  97% 99% 99%    Intake/Output Summary (Last 24 hours) at 02/09/12 1337 Last data filed at 02/09/12 1319  Gross per 24 hour  Intake 2886.48 ml  Output   1100 ml  Net 1786.48 ml    Exam:   General:  Pt is alert, follows commands appropriately, not in acute distress  Cardiovascular: Regular rate and rhythm, S1/S2, no murmurs, no rubs, no gallops  Respiratory: Clear to auscultation bilaterally, no wheezing, no crackles, no rhonchi  Abdomen: Soft, non tender, non distended, bowel sounds present, no guarding  Extremities: No edema, pulses DP and PT palpable bilaterally  Neuro: Grossly nonfocal  Data Reviewed: Basic Metabolic Panel:  Lab 02/09/12 8295 02/08/12 0600 02/07/12 1505  NA 138 138 134*  K 3.4* 3.5 3.8  CL 109 108 100  CO2 17* 18* 19  GLUCOSE 106* 226* 210*  BUN 28* 30* 35*  CREATININE 1.64* 1.73* 1.96*  CALCIUM 8.6 8.8 9.7  MG -- -- --  PHOS -- -- --   Liver Function Tests:  Lab 02/07/12 1505  AST 15  ALT 10  ALKPHOS 202*  BILITOT 0.3  PROT 7.2  ALBUMIN 2.1*   No results found for this basename: LIPASE:5,AMYLASE:5 in the last 168 hours No results found for this basename: AMMONIA:5 in the last 168 hours CBC:  Lab 02/09/12 0620 02/08/12 0600 02/07/12 1505  WBC 8.7 7.0 7.0  NEUTROABS -- -- 5.8  HGB 8.7* 7.7* 9.3*  HCT 25.6* 23.3* 28.7*  MCV 81.3 80.9 81.3  PLT 93* 105* 127*   Cardiac Enzymes: No results found for this basename: CKTOTAL:5,CKMB:5,CKMBINDEX:5,TROPONINI:5 in the last 168 hours BNP: No components found with this basename: POCBNP:5 CBG:  Lab 02/09/12 1135 02/09/12 0728 02/09/12 0413 02/08/12 2349 02/08/12 1949    GLUCAP 167* 169* 166* 163* 140*    Recent Results (from the past 240 hour(s))  TECHNOLOGIST REVIEW     Status: Normal   Collection Time   02/01/12  7:45 AM      Component Value Range Status Comment   Technologist Review Metas and Myelocytes present   Final   URINE CULTURE     Status: Normal   Collection Time   02/07/12  3:18 PM      Component Value Range Status Comment   Specimen Description URINE, CLEAN CATCH   Final    Special Requests NONE   Final    Culture  Setup Time [id number]   Final    Colony Count [id number]   Final    Culture     Final    Value: Multiple bacterial morphotypes present, none predominant. Suggest appropriate recollection if clinically indicated.   Report Status 02/08/2012 FINAL   Final   CULTURE, BLOOD (ROUTINE X 2)     Status: Normal (Preliminary result)   Collection Time   02/07/12  3:37 PM      Component Value Range Status Comment   Specimen Description BLOOD LEFT ANTECUBITAL   Final    Special Requests BOTTLES DRAWN AEROBIC AND ANAEROBIC 5 CC EACH   Final    Culture  Setup Time [id number]   Final    Culture     Final    Value:        BLOOD CULTURE RECEIVED NO GROWTH TO DATE CULTURE WILL BE HELD FOR 5 DAYS BEFORE ISSUING A FINAL NEGATIVE REPORT   Report Status PENDING   Incomplete   CULTURE, BLOOD (ROUTINE X 2)     Status: Normal (Preliminary result)   Collection Time   02/07/12  4:19 PM      Component Value Range Status Comment   Specimen Description BLOOD PORTA CATH   Final    Special Requests BOTTLES DRAWN AEROBIC AND ANAEROBIC [facility] EACH   Final    Culture  Setup Time [id number]   Final    Culture     Final    Value:        BLOOD CULTURE RECEIVED NO GROWTH TO DATE CULTURE WILL BE HELD FOR 5 DAYS BEFORE ISSUING A FINAL NEGATIVE REPORT   Report Status PENDING   Incomplete   MRSA PCR SCREENING     Status: Normal   Collection Time   02/08/12 12:52 AM      Component Value Range Status Comment   MRSA by PCR NEGATIVE  NEGATIVE Final       Studies: Dg Chest Port 1 View  02/07/2012  *RADIOLOGY REPORT*  Clinical Data: Cough, fever, history of gastric cancer  PORTABLE CHEST - 1 VIEW  Comparison: 01/17/2012; 04/29/2011; PET CT - 12/21/2011  Findings: Unchanged cardiac silhouette and mediastinal contours. Stable positioning of support apparatus.  Increased right upper lung heterogeneous air space opacities worrisome for infection. The left hemithorax is unchanged.  No definite pleural effusion.  A skin fold overlies the peripheral aspect of the left upper lung. No definite pneumothorax.  Unchanged bones.  IMPRESSION: Worsening right upper lung pneumonia.  A follow-up chest radiograph in 4 to 6 weeks after treatment is recommended to ensure resolution.  Original Report Authenticated By: Waynard Reeds, M.D.    Scheduled Meds:   . sodium chloride   Intravenous STAT  . enoxaparin  40 mg Subcutaneous QHS  . feeding supplement  237 mL Oral BID BM  . hydrocortisone sod succinate (SOLU-CORTEF) injection  50 mg Intravenous Q8H  . insulin aspart  0-9 Units Subcutaneous Q4H  . pantoprazole  40 mg Oral Daily  . piperacillin-tazobactam  3.375 g Intravenous Q8H  . potassium chloride  40 mEq Oral Once  . predniSONE  20 mg Oral QODAY  . vancomycin  500 mg Intravenous Q12H  . DISCONTD: piperacillin-tazobactam  3.375 g Intravenous Q8H   Continuous Infusions:   . 0.9 % NaCl with KCl 20 mEq / L 75 mL/hr at 02/09/12 0746

## 2012-02-09 NOTE — Clinical Documentation Improvement (Signed)
MALNUTRITION DOCUMENTATION CLARIFICATION  THIS DOCUMENT IS NOT A PERMANENT PART OF THE MEDICAL RECORD  TO RESPOND TO THE THIS QUERY, FOLLOW THE INSTRUCTIONS BELOW:  1. If needed, update documentation for the patient's encounter via the notes activity.  2. Access this query again and click edit on the In [employer].  3. After updating, or not, click F2 to complete all highlighted (required) fields concerning your review. Select "additional documentation in the medical record" OR "no additional documentation provided".  4. Click Sign note button.  5. The deficiency will fall out of your In Basket *Please let us know if you are not able to complete this workflow by phone or e-mail (listed below).  Please update your documentation within the medical record to reflect your response to this query.                                                                                        02/10/2012   Dear Dr.Kalissa Grays / [facility],  In a better effort to capture your patient's severity of illness, reflect appropriate length of stay and utilization of resources, a review of the patient medical record has revealed the following indicators.    Based on your clinical judgment, please clarify and document in a progress note and/or discharge summary the clinical condition associated with the following supporting information:  In responding to this query please exercise your independent judgment.  The fact that a query is asked, does not imply that any particular answer is desired or expected. Pt admitted with pneumonia. According to  nutritional consult note on 02/08/12  patient meets criteria for "severe malnutrition in the context of chronic illness"  If this is an appropriate diagnosis please document, if not please clarify the malnutrition status of patient if known.    Moderate Malnutrition Severe Malnutrition   Protein Calorie Malnutrition Severe Protein Calorie Malnutrition    _______Other  Condition________________ _______Cannot clinically determine     Supporting Information: Risk Factors:  PNA , Gastric cancer  Signs & Symptoms: per RD note:"appears thin.  reported difficulty chewing due to no teeth. He reported his appetite and intake were poor for the past week" -Ht: 22ft 5in       Wt:120lbs  -BMI:19.9  -Albumin level: 2.1  -Total Protein: 7.2  -Calcium level: 8.8 -Glu: 226  Treatments: NS with KCL 20 mEq /L@75ml /h Ensure BID , Modify diet to chopped  -Medications:Protonix, Insulin, Prednisone, Solu-Cortef  -Nutrition Consult:  02/08/12   Severe malnutrition in the context of acute illness or injury    *Patient meets malnutrition criteria due to PO intake < 50 % of estimated energy needs over the past 5 days and apparent moderate wasting of both muscle mass and body fat   You may use possible, probable, or suspect with inpatient documentation. possible, probable, suspected diagnoses MUST be documented at the time of discharge  Reviewed: additional information provided in d/c summary 02/10/2012  Thank You,  Andy Gauss RN  Clinical Documentation Specialist:  Pager [phone] E-mail [email or web address]  Health Information Management [place]

## 2012-02-10 DIAGNOSIS — J438 Other emphysema: Secondary | ICD-10-CM

## 2012-02-10 DIAGNOSIS — J159 Unspecified bacterial pneumonia: Secondary | ICD-10-CM

## 2012-02-10 DIAGNOSIS — E2749 Other adrenocortical insufficiency: Secondary | ICD-10-CM

## 2012-02-10 DIAGNOSIS — I959 Hypotension, unspecified: Secondary | ICD-10-CM

## 2012-02-10 LAB — GLUCOSE, CAPILLARY
Glucose-Capillary: 114 mg/dL — ABNORMAL HIGH (ref 70–99)
Glucose-Capillary: 164 mg/dL — ABNORMAL HIGH (ref 70–99)

## 2012-02-10 MED ORDER — HYDROCODONE-ACETAMINOPHEN 5-500 MG PO TABS: 1.0000 | ORAL_TABLET | Freq: Four times a day (QID) | ORAL | Status: DC | PRN

## 2012-02-10 MED ORDER — LEVOFLOXACIN 500 MG PO TABS: 500.0000 mg | ORAL_TABLET | Freq: Every day | ORAL | Status: AC

## 2012-02-10 MED ORDER — DM-GUAIFENESIN ER 30-600 MG PO TB12
1.0000 | ORAL_TABLET | Freq: Two times a day (BID) | ORAL | Status: DC
  Administered 2012-02-10: 1 via ORAL
  Filled 2012-02-10 (×2): qty 1

## 2012-02-10 MED ORDER — ONDANSETRON HCL 8 MG PO TABS: 8.0000 mg | ORAL_TABLET | Freq: Three times a day (TID) | ORAL | Status: DC | PRN

## 2012-02-10 MED ORDER — HEPARIN SOD (PORK) LOCK FLUSH 100 UNIT/ML IV SOLN
INTRAVENOUS | Status: AC
  Filled 2012-02-10: qty 5

## 2012-02-10 MED ORDER — DM-GUAIFENESIN ER 30-600 MG PO TB12: 1.0000 | ORAL_TABLET | Freq: Two times a day (BID) | ORAL | Status: AC

## 2012-02-10 NOTE — Discharge Summary (Addendum)
Physician Discharge Summary  Keith Bell ZOX:[id number] DOB: 1950/09/10 DOA: 02/07/2012  PCP: Eino Farber, MD  Admit date: 02/07/2012 Discharge date: 02/10/2012  Discharge Condition: medically stable for discharge and clinically appears well, patient verbalized he will follow up with PCP and dprimary oncologist in regards to cancer related issues  Diet recommendation: regular  History of present illness:   Pt 61 y.o. male with history of stomach cancer, COPD, hypertension, on and off [employer] due to neutropenia in the past, admitted 02/07/2012 to [facility] due to progressively worsening productive cough, fevers, chills, and is currently being treated for Right upper lobe PNA. Please note that pt has a right upper chest Port-A-Cath for quite a while without any evidence of infection there. He was recently placed on prednisone 20 mg every other day by Dr. Arline Asp to increase his appetite.   Assessment/Plan:   Principal Problem:  *PNA (pneumonia)  - pt is clinically improving and is maintaining oxygen saturations > 97% on 2 L [place]  - will continue broad spectrum antibiotics now, Vancomycin and Zosyn (day #4)  - may be discharged home with Levaquin for next 10 days  Active Problems:  Hodgkin's lymphoma in relapse  - per oncology this is an advanced cancer and patient is not likely candidate for further treatment   COPD (chronic obstructive pulmonary disease)  - this appears to be sable at this point  - will continue supportive management for now  - pt is maintaining oxygen saturations > 97%   HTN (hypertension)  - stable  - BP was on soft side throughout the hospital stay and although more stable today i would not start any BP meds as he is slowly trying to get his appetite back and may have variations in his BP.  Hyperlipidemia  - will continue statin   Hypotension  - resolved - BP 144/96 on discahrge  Anemia of chronic disease  - Hg stable status post 1 unit  PRBC transfusion   Acute on chronic renal failure, stage II  - creatinine trending down   Hyperglycemia  - secondary to steroid use  - CBG 164, 102  Moderate protein calorie malnutrition - encouraged PO intake  Code Status: DNR  Family Communication: Pt at bedside  Disposition Plan: PT evaluation   Discharge Exam: Filed Vitals:   02/10/12 0549  BP: 144/96  Pulse: 90  Temp: 98.1 F (36.7 C)  Resp: 20   Filed Vitals:   02/09/12 1351 02/09/12 2015 02/10/12 0010 02/10/12 0549  BP: 123/73  102/72 144/96  Pulse: 77 64 82 90  Temp: 98.6 F (37 C)  98 F (36.7 C) 98.1 F (36.7 C)  TempSrc: Oral  Oral Oral  Resp: 18  18 20   Height:      Weight:      SpO2: 99%  97% 98%    General: Pt is alert, follows commands appropriately, not in acute distress Cardiovascular: Regular rate and rhythm, S1/S2 +, no murmurs, no rubs, no gallops Respiratory: Clear to auscultation bilaterally, no wheezing, no crackles, no rhonchi Abdominal: Soft, non tender, non distended, bowel sounds +, no guarding Extremities: no edema, no cyanosis, pulses palpable bilaterally DP and PT Neuro: Grossly nonfocal  Discharge Instructions  Discharge Orders    Future Appointments: Provider: Department: Dept Phone: Center:   02/15/2012 8:00 AM Dava Najjar Idelle Jo [facility] [phone] None   02/24/2012 3:30 PM Alvina Filbert [facility] [phone] None   02/24/2012 4:00 PM [name], MD [facility] [phone] None     Future Orders Please Complete By Expires   Diet - low sodium heart healthy      Increase activity slowly      Call MD for:  persistant nausea and vomiting      Call MD for:  severe uncontrolled pain      Call MD for:  difficulty breathing, headache or visual disturbances      Call MD for:  persistant dizziness or light-headedness        Medication List  As of 02/10/2012 12:24 PM   STOP taking these medications         predniSONE 20 MG tablet         TAKE these  medications         dextromethorphan-guaiFENesin 30-600 MG per 12 hr tablet   Commonly known as: MUCINEX DM   Take 1 tablet by mouth 2 (two) times daily.      everolimus 10 MG tablet   Commonly known as: AFINITOR   Take 10 mg by mouth every other day. Take 1 po every  Other day      Guaifenesin 1200 MG Tb12   Take 1 tablet (1,200 mg total) by mouth 2 (two) times daily.      HYDROcodone-acetaminophen 5-500 MG per tablet   Commonly known as: VICODIN   Take 1 tablet by mouth every 6 (six) hours as needed for pain.      levofloxacin 500 MG tablet   Commonly known as: LEVAQUIN   Take 1 tablet (500 mg total) by mouth daily.      ondansetron 8 MG tablet   Commonly known as: ZOFRAN   Take 1 tablet (8 mg total) by mouth every 8 (eight) hours as needed. For nausea      pantoprazole 40 MG tablet   Commonly known as: PROTONIX   Take 1 tablet (40 mg total) by mouth daily.      potassium chloride SA 20 MEQ tablet   Commonly known as: K-DUR,KLOR-CON   Take 20 mEq by mouth daily.      prochlorperazine 10 MG tablet   Commonly known as: COMPAZINE   Take 10 mg by mouth every 6 (six) hours as needed. For nausea      prochlorperazine 10 MG tablet   Commonly known as: COMPAZINE   Take 1 tablet (10 mg total) by mouth every 6 (six) hours as needed.           Follow-up Information    Follow up with [facility],GEORGE R, MD in 1 week.   Contact information:   Individual [address] [place] [address] [phone]           The results of significant diagnostics from this hospitalization (including imaging, microbiology, ancillary and laboratory) are listed below for reference.    Significant Diagnostic Studies: Dg Chest 2 View 01/17/2012  *RADIOLOGY REPORT*  Clinical Data: Pain in the right shoulder, fever, cough, history of Hodgkin's lymphoma  CHEST - 2 VIEW  Comparison: PET CT of 12/21/2011 and chest x-ray of 04/29/2011  Findings: There is parenchymal  opacity within the right upper lung field suspicious for pneumonia.  Otherwise the lungs are clear.  No effusion is seen. Mediastinal contours appear normal.  A power port Port-A-Cath remains with the tip in the mid SVC.  Heart size is stable.  No bony abnormality is seen.  IMPRESSION:  1.  Right upper lobe opacity most consistent with pneumonia. 2.  Port-A-Cath tip remains in the mid SVC.  Original Report Authenticated By: Juline Patch, M.D.   Dg Shoulder Right 01/17/2012  *RADIOLOGY REPORT*  Clinical Data: Pain in the right shoulder, fever  RIGHT SHOULDER - 2+ VIEW  Comparison: None.  Findings: There is parenchymal opacity in the right upper lobe most consistent with pneumonia.  The right humeral head is in normal position and the glenohumeral joint space appears normal.  There is mild right AC joint degenerative spurring present.  No acute bony abnormality is seen.  IMPRESSION:  1.  Mild degenerative change at the right [facility] joint. 2.  Opacity in the right upper lobe suspicious for pneumonia.  Original Report Authenticated By: Juline Patch, M.D.   Dg Chest Port 1 View 02/07/2012  *RADIOLOGY REPORT*  Clinical Data: Cough, fever, history of gastric cancer  PORTABLE CHEST - 1 VIEW  Comparison: 01/17/2012; 04/29/2011; PET CT - 12/21/2011  Findings: Unchanged cardiac silhouette and mediastinal contours. Stable positioning of support apparatus.  Increased right upper lung heterogeneous air space opacities worrisome for infection. The left hemithorax is unchanged.  No definite pleural effusion.  A skin fold overlies the peripheral aspect of the left upper lung. No definite pneumothorax.  Unchanged bones.  IMPRESSION: Worsening right upper lung pneumonia.  A follow-up chest radiograph in 4 to 6 weeks after treatment is recommended to ensure resolution.  Original Report Authenticated By: Waynard Reeds, M.D.    Microbiology: URINE CULTURE     Status: Normal   Collection Time   02/07/12  3:18 PM      Component  Value Range Status Comment   Specimen Description URINE,  Final    Culture     Final    Value: Multiple bacterial morphotypes present, none predominant. Suggest appropriate recollection if clinically indicated.   Report Status 02/08/2012 FINAL   Final   CULTURE, BLOOD (ROUTINE X 2)     Status: Normal (Preliminary result)   Collection Time   02/07/12  3:37 PM      Component Value Range Status Comment   Culture     Final    Value:        BLOOD CULTURE RECEIVED NO GROWTH TO DATE    Report Status PENDING   Incomplete   CULTURE, BLOOD (ROUTINE X 2)     Status: Normal (Preliminary result)   Collection Time   02/07/12  4:19 PM      Component Value Range Status Comment   Culture     Final    Value:        BLOOD CULTURE RECEIVED NO GROWTH TO DATE   Report Status PENDING   Incomplete   MRSA PCR SCREENING     Status: Normal   Collection Time   02/08/12 12:52 AM      Component Value Range Status Comment   MRSA by PCR NEGATIVE  NEGATIVE Final      Labs: Basic Metabolic Panel:  Lab 02/09/12 8469 02/08/12 0600 02/07/12 1505  NA 138 138 134*  K 3.4* 3.5 3.8  CL 109 108 100  CO2 17* 18* 19  GLUCOSE 106* 226* 210*  BUN 28* 30* 35*  CREATININE 1.64* 1.73* 1.96*  CALCIUM 8.6 8.8 9.7   Liver Function Tests:  Lab 02/07/12 1505  AST 15  ALT 10  ALKPHOS 202*  BILITOT 0.3  PROT 7.2  ALBUMIN 2.1*   CBC:  Lab 02/09/12 0620 02/08/12 0600 02/07/12 1505  WBC 8.7 7.0 7.0  HGB  8.7* 7.7* 9.3*  HCT 25.6* 23.3* 28.7*  MCV 81.3 80.9 81.3  PLT 93* 105* 127*   CBG:  Lab 02/10/12 1037 02/10/12 0825 02/10/12 0435 02/10/12 0009 02/09/12 2005  GLUCAP 164* 102* 86 114* 103*    Time coordinating discharge: Over 30 minutes  Signed:  Manson Passey, MD  [facility] 02/10/2012, 12:24 PM  Pager #: [phone]

## 2012-02-10 NOTE — Discharge Instructions (Signed)

## 2012-02-12 LAB — TYPE AND SCREEN
ABO/RH(D): O POS
Antibody Screen: NEGATIVE
Unit division: 0
Unit division: 0

## 2012-02-14 LAB — CULTURE, BLOOD (ROUTINE X 2)

## 2012-02-15 ENCOUNTER — Encounter: Payer: Self-pay | Admitting: Dietician

## 2012-02-15 ENCOUNTER — Other Ambulatory Visit: Payer: Self-pay | Admitting: Lab

## 2012-02-15 NOTE — Progress Notes (Signed)
Brief Out-patient Oncology Nutrition Note  Reason: Patient screened positive for nutrition risk for unintentional weight loss and decreased appetite.   Mr. Keith Bell is a 61 year old patient of Dr. Arline Asp, diagnosed with lymphoma. Contacted patient via telephone for nutrition risk. Patient's wife asked if I speak with her. She reported patient's appetite is not good. She reported patient mostly eats a banana and milk for a meal. She stated patient will drink a little of an Ensure.   Wt Readings from Last 10 Encounters:  02/07/12 120 lb (54.432 kg)  01/24/12 122 lb 11.2 oz (55.656 kg)  01/17/12 131 lb (59.421 kg)  12/29/11 131 lb 1.6 oz (59.467 kg)  11/23/11 129 lb 9.6 oz (58.786 kg)  10/19/11 128 lb 12.8 oz (58.423 kg)  09/20/11 125 lb 14.4 oz (57.108 kg)  09/01/11 128 lb 1.6 oz (58.106 kg)  08/18/11 127 lb 6.4 oz (57.788 kg)  07/18/11 132 lb 4.8 oz (60.011 kg)  *Unintentional weight loss of 12 lb over 6 months, 9% from baseline.   BMI: 19.96 kg/ m^2  I have educated the patient's wife on nutritional symptom management for nausea and high-calorie, high protein diet. I suggested adding carnation instant breakfast or protein powder to patient's mil to increase calories. We discussed other strategies to increase calorie and protein intake. She asked good questions and is without further nutrition questions at this time. She denies nutrition appointment but would like to receive nutrition information through the mail. RD contact information provided. Instructed patient's wife to contact RD for questions or concerns.  Iven Finn [facility] [phone]

## 2012-02-17 ENCOUNTER — Telehealth: Payer: Self-pay | Admitting: *Deleted

## 2012-02-17 NOTE — Telephone Encounter (Signed)
Received fax request for refill on vicodin.  Called pharmacy and pt. Got #30 on 02/06/12.  EMR chart shows that patient got script for #30 more from Dr. Manson Passey @[facility]  on 02/10/12.  His regular pharmacy [facility] [address] Says he did not get this filled there. Called patient to see how he is doing pain-wise.  Pt. States that he  Did not call this in ("she must have done it").  Pt. States he has enough.  He states that he will see Dr. Arline Asp this coming Friday and will get more at that appt.. Let him know that if he needs more pain medications before then, he is to call us.

## 2012-02-22 ENCOUNTER — Encounter (HOSPITAL_COMMUNITY): Payer: Self-pay | Admitting: *Deleted

## 2012-02-22 ENCOUNTER — Other Ambulatory Visit: Payer: Self-pay

## 2012-02-22 ENCOUNTER — Emergency Department (HOSPITAL_COMMUNITY): Payer: Medicare Other

## 2012-02-22 ENCOUNTER — Inpatient Hospital Stay (HOSPITAL_COMMUNITY)
Admission: EM | Admit: 2012-02-22 | Discharge: 2012-02-27 | DRG: 177 | Disposition: A | Discharge status: Home / Self Care | Payer: Medicare Other | Attending: Internal Medicine | Admitting: Internal Medicine

## 2012-02-22 DIAGNOSIS — C169 Malignant neoplasm of stomach, unspecified: Secondary | ICD-10-CM

## 2012-02-22 DIAGNOSIS — J852 Abscess of lung without pneumonia: Principal | ICD-10-CM | POA: Diagnosis present

## 2012-02-22 DIAGNOSIS — J850 Gangrene and necrosis of lung: Secondary | ICD-10-CM | POA: Diagnosis present

## 2012-02-22 DIAGNOSIS — I739 Peripheral vascular disease, unspecified: Secondary | ICD-10-CM | POA: Diagnosis present

## 2012-02-22 DIAGNOSIS — E872 Acidosis, unspecified: Secondary | ICD-10-CM | POA: Diagnosis present

## 2012-02-22 DIAGNOSIS — D509 Iron deficiency anemia, unspecified: Secondary | ICD-10-CM | POA: Diagnosis present

## 2012-02-22 DIAGNOSIS — J449 Chronic obstructive pulmonary disease, unspecified: Secondary | ICD-10-CM | POA: Diagnosis present

## 2012-02-22 DIAGNOSIS — N189 Chronic kidney disease, unspecified: Secondary | ICD-10-CM

## 2012-02-22 DIAGNOSIS — I10 Essential (primary) hypertension: Secondary | ICD-10-CM | POA: Diagnosis present

## 2012-02-22 DIAGNOSIS — J4489 Other specified chronic obstructive pulmonary disease: Secondary | ICD-10-CM | POA: Diagnosis present

## 2012-02-22 DIAGNOSIS — I129 Hypertensive chronic kidney disease with stage 1 through stage 4 chronic kidney disease, or unspecified chronic kidney disease: Secondary | ICD-10-CM | POA: Diagnosis present

## 2012-02-22 DIAGNOSIS — E119 Type 2 diabetes mellitus without complications: Secondary | ICD-10-CM | POA: Diagnosis present

## 2012-02-22 DIAGNOSIS — N179 Acute kidney failure, unspecified: Secondary | ICD-10-CM | POA: Diagnosis present

## 2012-02-22 DIAGNOSIS — Z66 Do not resuscitate: Secondary | ICD-10-CM | POA: Diagnosis present

## 2012-02-22 DIAGNOSIS — R112 Nausea with vomiting, unspecified: Secondary | ICD-10-CM | POA: Diagnosis present

## 2012-02-22 DIAGNOSIS — N183 Chronic kidney disease, stage 3 unspecified: Secondary | ICD-10-CM | POA: Diagnosis present

## 2012-02-22 DIAGNOSIS — R739 Hyperglycemia, unspecified: Secondary | ICD-10-CM

## 2012-02-22 DIAGNOSIS — I70219 Atherosclerosis of native arteries of extremities with intermittent claudication, unspecified extremity: Secondary | ICD-10-CM | POA: Diagnosis present

## 2012-02-22 DIAGNOSIS — D649 Anemia, unspecified: Secondary | ICD-10-CM | POA: Diagnosis present

## 2012-02-22 DIAGNOSIS — C8190 Hodgkin lymphoma, unspecified, unspecified site: Secondary | ICD-10-CM | POA: Diagnosis present

## 2012-02-22 DIAGNOSIS — E785 Hyperlipidemia, unspecified: Secondary | ICD-10-CM | POA: Diagnosis present

## 2012-02-22 DIAGNOSIS — J189 Pneumonia, unspecified organism: Secondary | ICD-10-CM | POA: Diagnosis present

## 2012-02-22 DIAGNOSIS — C8590 Non-Hodgkin lymphoma, unspecified, unspecified site: Secondary | ICD-10-CM

## 2012-02-22 LAB — CBC WITH DIFFERENTIAL/PLATELET
Basophils Absolute: 0 10*3/uL (ref 0.0–0.1)
Eosinophils Absolute: 1.4 10*3/uL — ABNORMAL HIGH (ref 0.0–0.7)
HCT: 20.5 % — ABNORMAL LOW (ref 39.0–52.0)
Lymphocytes Relative: 8 % — ABNORMAL LOW (ref 12–46)
Lymphs Abs: 1 10*3/uL (ref 0.7–4.0)
MCH: 26.8 pg (ref 26.0–34.0)
MCHC: 34.1 g/dL (ref 30.0–36.0)
MCV: 78.5 fL (ref 78.0–100.0)
Monocytes Absolute: 1.2 10*3/uL — ABNORMAL HIGH (ref 0.1–1.0)
Neutro Abs: 9.5 10*3/uL — ABNORMAL HIGH (ref 1.7–7.7)
RDW: 20.9 % — ABNORMAL HIGH (ref 11.5–15.5)

## 2012-02-22 LAB — LACTIC ACID, PLASMA: Lactic Acid, Venous: 1 mmol/L (ref 0.5–2.2)

## 2012-02-22 LAB — COMPREHENSIVE METABOLIC PANEL
BUN: 39 mg/dL — ABNORMAL HIGH (ref 6–23)
CO2: 13 mEq/L — ABNORMAL LOW (ref 19–32)
Calcium: 9.5 mg/dL (ref 8.4–10.5)
Creatinine, Ser: 1.91 mg/dL — ABNORMAL HIGH (ref 0.50–1.35)
GFR calc Af Amer: 42 mL/min — ABNORMAL LOW (ref >90)
GFR calc non Af Amer: 37 mL/min — ABNORMAL LOW (ref >90)
Glucose, Bld: 104 mg/dL — ABNORMAL HIGH (ref 70–99)
Total Protein: 7 g/dL (ref 6.0–8.3)

## 2012-02-22 LAB — PROCALCITONIN: Procalcitonin: 3.27 ng/mL

## 2012-02-22 LAB — D-DIMER, QUANTITATIVE: D-Dimer, Quant: 5.81 ug/mL-FEU — ABNORMAL HIGH (ref 0.00–0.48)

## 2012-02-22 LAB — TROPONIN I: Troponin I: <0.3 ng/mL (ref <0.30)

## 2012-02-22 MED ORDER — IPRATROPIUM BROMIDE 0.02 % IN SOLN
0.5000 mg | Freq: Three times a day (TID) | RESPIRATORY_TRACT | Status: DC
  Administered 2012-02-22: 0.5 mg via RESPIRATORY_TRACT
  Filled 2012-02-22: qty 2.5

## 2012-02-22 MED ORDER — SODIUM CHLORIDE 0.9 % IV SOLN
INTRAVENOUS | Status: DC
  Administered 2012-02-22 – 2012-02-24 (×4): via INTRAVENOUS

## 2012-02-22 MED ORDER — LEVALBUTEROL HCL 0.63 MG/3ML IN NEBU
0.6300 mg | INHALATION_SOLUTION | Freq: Four times a day (QID) | RESPIRATORY_TRACT | Status: DC
  Administered 2012-02-22 – 2012-02-27 (×18): 0.63 mg via RESPIRATORY_TRACT
  Filled 2012-02-22 (×23): qty 3

## 2012-02-22 MED ORDER — LEVOFLOXACIN IN D5W 750 MG/150ML IV SOLN
750.0000 mg | INTRAVENOUS | Status: DC
  Filled 2012-02-22: qty 150

## 2012-02-22 MED ORDER — POTASSIUM CHLORIDE CRYS ER 20 MEQ PO TBCR
40.0000 meq | EXTENDED_RELEASE_TABLET | Freq: Once | ORAL | Status: AC
  Administered 2012-02-22: 40 meq via ORAL
  Filled 2012-02-22: qty 2

## 2012-02-22 MED ORDER — PANTOPRAZOLE SODIUM 40 MG PO TBEC
40.0000 mg | DELAYED_RELEASE_TABLET | Freq: Every day | ORAL | Status: DC
  Administered 2012-02-23 – 2012-02-27 (×5): 40 mg via ORAL
  Filled 2012-02-22 (×5): qty 1

## 2012-02-22 MED ORDER — DEXTROSE 5 % IV SOLN
1.0000 g | INTRAVENOUS | Status: DC
  Administered 2012-02-23: 1 g via INTRAVENOUS
  Filled 2012-02-22 (×2): qty 1

## 2012-02-22 MED ORDER — LEVOFLOXACIN IN D5W 750 MG/150ML IV SOLN
750.0000 mg | INTRAVENOUS | Status: AC
  Administered 2012-02-22 – 2012-02-25 (×2): 750 mg via INTRAVENOUS
  Filled 2012-02-22 (×2): qty 150

## 2012-02-22 MED ORDER — HEPARIN SOD (PORK) LOCK FLUSH 100 UNIT/ML IV SOLN
250.0000 [IU] | INTRAVENOUS | Status: DC | PRN
  Filled 2012-02-22: qty 3

## 2012-02-22 MED ORDER — SODIUM CHLORIDE 0.9 % IJ SOLN: 3.0000 mL | INTRAMUSCULAR | Status: DC | PRN

## 2012-02-22 MED ORDER — LEVALBUTEROL HCL 0.63 MG/3ML IN NEBU
0.6300 mg | INHALATION_SOLUTION | RESPIRATORY_TRACT | Status: DC | PRN
  Filled 2012-02-22: qty 3

## 2012-02-22 MED ORDER — TECHNETIUM TO 99M ALBUMIN AGGREGATED
3.3000 | Freq: Once | INTRAVENOUS | Status: AC | PRN
  Administered 2012-02-22: 3 via INTRAVENOUS

## 2012-02-22 MED ORDER — VANCOMYCIN HCL 500 MG IV SOLR
500.0000 mg | INTRAVENOUS | Status: DC
  Administered 2012-02-22 – 2012-02-23 (×2): 500 mg via INTRAVENOUS
  Filled 2012-02-22 (×2): qty 500

## 2012-02-22 MED ORDER — DEXTROSE 5 % IV SOLN
1.0000 g | Freq: Three times a day (TID) | INTRAVENOUS | Status: DC
  Administered 2012-02-22: 1 g via INTRAVENOUS
  Filled 2012-02-22 (×3): qty 1

## 2012-02-22 MED ORDER — ADULT MULTIVITAMIN W/MINERALS CH
1.0000 | ORAL_TABLET | Freq: Every day | ORAL | Status: DC
  Administered 2012-02-23 – 2012-02-27 (×5): 1 via ORAL
  Filled 2012-02-22 (×5): qty 1

## 2012-02-22 MED ORDER — DEXTROSE 5 % IV SOLN: 1.0000 g | Freq: Three times a day (TID) | INTRAVENOUS | Status: DC

## 2012-02-22 MED ORDER — EVEROLIMUS 10 MG PO TABS: 10.0000 mg | ORAL_TABLET | ORAL | Status: DC

## 2012-02-22 MED ORDER — HYDROCODONE-ACETAMINOPHEN 5-325 MG PO TABS
1.0000 | ORAL_TABLET | Freq: Four times a day (QID) | ORAL | Status: DC | PRN
  Administered 2012-02-23 – 2012-02-26 (×4): 1 via ORAL
  Filled 2012-02-22: qty 2
  Filled 2012-02-22 (×2): qty 1
  Filled 2012-02-22: qty 2

## 2012-02-22 MED ORDER — POTASSIUM CHLORIDE CRYS ER 20 MEQ PO TBCR
20.0000 meq | EXTENDED_RELEASE_TABLET | Freq: Every day | ORAL | Status: DC
  Administered 2012-02-23 – 2012-02-26 (×4): 20 meq via ORAL
  Filled 2012-02-22 (×5): qty 1

## 2012-02-22 MED ORDER — METHYLPREDNISOLONE SODIUM SUCC 40 MG IJ SOLR
40.0000 mg | Freq: Two times a day (BID) | INTRAMUSCULAR | Status: DC
  Administered 2012-02-22 – 2012-02-24 (×4): 40 mg via INTRAVENOUS
  Filled 2012-02-22 (×5): qty 1

## 2012-02-22 MED ORDER — METOCLOPRAMIDE HCL 5 MG/ML IJ SOLN
10.0000 mg | Freq: Once | INTRAMUSCULAR | Status: AC
  Administered 2012-02-22: 10 mg via INTRAVENOUS
  Filled 2012-02-22: qty 2

## 2012-02-22 NOTE — ED Provider Notes (Signed)
History     CSN: [id number]  Arrival date & time 02/22/12  1359   First MD Initiated Contact with Patient 02/22/12 1502      Chief Complaint  Patient presents with  . Shortness of Breath  . Hiccups  . Dizziness    (Consider location/radiation/quality/duration/timing/severity/associated sxs/prior treatment) HPI Comments: Keith Bell 61 y.o. male   The chief complaint is: Patient presents with:   Shortness of Breath   Hiccups   Dizziness   The patient has medical history significant for:   Past Medical History:   Dyslipidemia                                    2005         Cancer                                                         Comment:stomach   Hypertension                                                   Comment:No longer on BP pills x 4 years  61 y/o male with history signifcant for non-hodgkins lymphoma, gastric cancer, CHF, and COPD who presents with SOB that began Sunday. Patient states that symptoms have been present and gradually worsening. There is some associated pain behind the right shoulder blade.Patient also reports hiccups which have persisted over 4 days and are not relieved with drinking fluids or Zantac.  There is also a productive cough that has been ongoing for several weeks with clear white sputum. Patient was recently discharged on 02/07/12 for community acquired PNA. He states that he has not had a bowel movement since discharge but also has not been eating much. Denies fever, chills, night sweats.Denies CP,palpitations, but report SOB. Denies abdominal pain, but post-tussive emesis.  Patient denies sore throat but reports rhinorrhea.  Patient is a 61 y.o. male presenting with shortness of breath.  Shortness of Breath  Associated symptoms include rhinorrhea and shortness of breath. Pertinent negatives include no chest pain and no fever.    Past Medical History  Diagnosis Date  . Dyslipidemia 2005  . Cancer     stomach  . Hypertension    No longer on BP pills x 4 years    Past Surgical History  Procedure Date  . No past surgeries     Family History  Problem Relation Age of Onset  . Heart disease Mother   . Heart disease Father   . Cancer Father   . Cancer Sister   . Cancer Sister     History  Substance Use Topics  . Smoking status: Former Smoker -- 1.0 packs/day for 30 years  . Smokeless tobacco: Never Used  . Alcohol Use: No      Review of Systems  Constitutional: Negative for fever, chills and diaphoresis.  HENT: Positive for rhinorrhea. Negative for congestion.   Respiratory: Positive for shortness of breath.   Cardiovascular: Positive for leg swelling. Negative for chest pain and palpitations.  Gastrointestinal: Positive for nausea, vomiting and constipation. Negative for  diarrhea.       Nausea and vomiting associated post-tussive emesis.    Allergies  Aspirin  Home Medications   Current Outpatient Rx  Name Route Sig Dispense Refill  . EVEROLIMUS 10 MG PO TABS Oral Take 10 mg by mouth every other day. Take 1 po every  Other day    . HYDROCODONE-ACETAMINOPHEN 5-500 MG PO TABS Oral Take 1 tablet by mouth every 6 (six) hours as needed.    Marland Kitchen LEVOFLOXACIN 500 MG PO TABS Oral Take 500 mg by mouth daily.    . ADULT MULTIVITAMIN W/MINERALS CH Oral Take 1 tablet by mouth daily.    Marland Kitchen ONDANSETRON HCL 8 MG PO TABS Oral Take 8 mg by mouth every 8 (eight) hours as needed. For nausea    . PANTOPRAZOLE SODIUM 40 MG PO TBEC Oral Take 1 tablet (40 mg total) by mouth daily. 30 tablet 6  . POTASSIUM CHLORIDE CRYS ER 20 MEQ PO TBCR Oral Take 20 mEq by mouth daily.    Marland Kitchen PREDNISONE 20 MG PO TABS Oral Take 20 mg by mouth every other day.    Marland Kitchen PROCHLORPERAZINE MALEATE 10 MG PO TABS Oral Take 1 tablet (10 mg total) by mouth every 6 (six) hours as needed. 30 tablet 0    BP 103/77  Pulse 108  Temp 98.5 F (36.9 C) (Oral)  Resp 16  SpO2 100%  Physical Exam  Constitutional: He appears well-developed and  well-nourished.  HENT:  Head: Normocephalic and atraumatic.  Mouth/Throat: Oropharynx is clear and moist.  Eyes: No scleral icterus.  Neck: Neck supple.  Cardiovascular: Normal rate, regular rhythm, normal heart sounds and intact distal pulses.        No pitting edema noted on extremities bilaterally.  Pulmonary/Chest: Effort normal and breath sounds normal.  Abdominal: Soft. Bowel sounds are normal. There is no tenderness.  Lymphadenopathy:    He has no cervical adenopathy.  Neurological: He is alert.  Skin: Skin is dry.    ED Course  Procedures (including critical care time)  Labs Reviewed  CBC WITH DIFFERENTIAL - Abnormal; Notable for the following:    WBC 13.1 (*)     RBC 2.61 (*)     Hemoglobin 7.0 (*)     HCT 20.5 (*)     RDW 20.9 (*)     Lymphocytes Relative 8 (*)     Eosinophils Relative 11 (*)     Neutro Abs 9.5 (*)     Monocytes Absolute 1.2 (*)     Eosinophils Absolute 1.4 (*)     All other components within normal limits  COMPREHENSIVE METABOLIC PANEL - Abnormal; Notable for the following:    Potassium 3.1 (*)     CO2 13 (*)     Glucose, Bld 104 (*)     BUN 39 (*)     Creatinine, Ser 1.91 (*)     Albumin 2.1 (*)     Alkaline Phosphatase 236 (*)     GFR calc non Af Amer 37 (*)     GFR calc Af Amer 42 (*)     All other components within normal limits  D-DIMER, QUANTITATIVE - Abnormal; Notable for the following:    D-Dimer, Quant 5.81 (*)     All other components within normal limits  PRO B NATRIURETIC PEPTIDE - Abnormal; Notable for the following:    Pro B Natriuretic peptide (BNP) 1118.0 (*)     All other components within normal limits  TROPONIN I  MAGNESIUM  PREPARE RBC (CROSSMATCH)   Dg Chest 2 View  02/22/2012  *RADIOLOGY REPORT*  Clinical Data: Short of breath.  Weakness and dizziness. Productive cough.  CHEST - 2 VIEW  Comparison: 02/07/2012 and multiple previous  Findings: Power port remains in place with its tip in the SVC 4 cm above the right  atrium.  There is been further worsening of consolidation and volume loss in the right upper lobe.  The remainder of the lungs are clear.  No effusions.  No bony abnormalities.  Impression:  Further worsening of consolidation and volume loss in the right upper lobe.  Original Report Authenticated By: Thomasenia Sales, M.D.   Nm Pulmonary Per & Vent  02/22/2012  *RADIOLOGY REPORT*  Clinical Data: Elevated D-dimer.  Abnormal chest radiograph.  NM PULMONARY VENTILATION AND PERFUSION SCAN  Radiopharmaceutical: CURIE MAA TECHNETIUM TO 80M ALBUMIN AGGREGATED  Comparison: Chest radiography same day  Findings: Perfusion of the left lung is normal.  There is diminished perfusion of the right upper lobe, matching the dense pulmonary infiltrate.  No defects seen in the right lower lobe.  IMPRESSION: Low probability of pulmonary embolism.  No defects in the lung that is normally aerated at radiography.  Matched defect in the right upper lobe to the region of dense pulmonary infiltrate.  Original Report Authenticated By: Thomasenia Sales, M.D.    Date: 02/22/2012  Rate: 107  Rhythm: sinus tachycardia  QRS Axis: normal  Intervals: normal  ST/T Wave abnormalities: normal  Conduction Disutrbances:none  Narrative Interpretation: Borderline with right axis deviation  Old EKG Reviewed: none available EKG personally reviewed by Dr. Lynelle Doctor    1. Non Hodgkin's lymphoma   2. Gastric cancer   3. HAP (hospital-acquired pneumonia)     MDM  61 year old male with history significant for non-hodgkin's lymphoma and gastric cancer, COPD that presented with SOB. CBC: remarkable for anemia(HGB 7), type and screen ordered, 2 units of blood will be transfused at admission. Hemeoccult: negative  CMP: remarkable for hypokalemia, magnesium normal, potassium replacement given. D-Dimer elevated and VQ scan ordered revealing low suspicion for PE. BNP elevated consistent with his history of CHF. CXR: did not reveal evidence of  pleural effusion or volume overload but did show a worsening of consolidation in the RUL. Patient discharged 02/07/12 for community acquired PNA. CXR warrants treatment for hospital acquired PNA and was initiated on this visit with Cefepime. Patient will be admitted to Hospitalist service per Dr. Lynelle Doctor.        Pixie Casino, PA-C 02/22/12 2110

## 2012-02-22 NOTE — H&P (Signed)
[facility] History and Physical  LONELL STAMOS ZOX:[id number] DOB: Feb 05, 1951 DOA: 02/22/2012  Referring physician: Linwood Dibbles, ER Physician PCP: Eino Farber, MD  Oncologist: Kimberlee Nearing  Chief Complaint: Shortness of breath  HPI:  Patient is a 61 year old African American male with past medical history of stage IV Hodgkin's lymphoma as well as COPD, chronic kidney disease and peripheral vascular disease who was just discharged from the hospital approximately 2 weeks ago for COPD and pneumonia. He initially had been feeling well, but for the last few days has had progressively worsening shortness of breath along with cough and generalized weakness and came into the emergency room.  In the emergency room, he was noted by x-ray to have worsening consolidation as seen on his previous x-ray from last admission. His creatinine was slightly elevated at 1.9 and on discharge was 1.6. His white blood cell count had increased to 13.1 and his hemoglobin was down to 7.0 (was 8.7 on discharge.) Most concerning was an anion gap at 18.  Patient underwent Hemoccult which was negative. He was put on oxygen and breathing treatments and started to feel a little bit better. Hospitalists were called for further evaluation and admission.  Review of Systems:  When I saw the patient in the emergency room, he was feeling a little bit better. He denied any headaches, vision changes, dysphasia, chest pain or palpitations. He did complain of worsening shortness of breath, although better than when he first came in. He did complain of wheezing and some nonproductive cough also increased. He denied any abdominal pain or hematuria or dysuria. No constipation or diarrhea. No focal extremity numbness weakness or pain although overall he did feel quite fatigued. His only other complaint was of no appetite which is more chronic in nature. Review of systems otherwise negative.  Past Medical History  Diagnosis  Date  . Dyslipidemia 2005  . Cancer     stomach  . Hypertension     No longer on BP pills x 4 years   Past Surgical History  Procedure Date  . No past surgeries    Social History:  reports that he has quit smoking. He has never used smokeless tobacco. He reports that he does not drink alcohol or use illicit drugs.  Allergies  Allergen Reactions  . Aspirin Nausea Only    Family History  Problem Relation Age of Onset  . Heart disease Mother   . Heart disease Father   . Cancer Father   . Cancer Sister   . Cancer Sister     Prior to Admission medications   Medication Sig Start Date End Date Taking? Authorizing Provider  everolimus (AFINITOR) 10 MG tablet Take 10 mg by mouth every other day. Take 1 po every  Other day   Yes Historical Provider, MD  HYDROcodone-acetaminophen (VICODIN) 5-500 MG per tablet Take 1 tablet by mouth every 6 (six) hours as needed. 02/10/12  Yes Alison Murray, MD  levofloxacin (LEVAQUIN) 500 MG tablet Take 500 mg by mouth daily. 02/14/12 02/24/12 Yes Historical Provider, MD  Multiple Vitamin (MULTIVITAMIN WITH MINERALS) TABS Take 1 tablet by mouth daily.   Yes Historical Provider, MD  ondansetron (ZOFRAN) 8 MG tablet Take 8 mg by mouth every 8 (eight) hours as needed. For nausea 02/10/12  Yes Alison Murray, MD  pantoprazole (PROTONIX) 40 MG tablet Take 1 tablet (40 mg total) by mouth daily. 09/01/11 08/31/12 Yes Gerarda Fraction Murinson, MD  potassium chloride SA (K-DUR,KLOR-CON) 20 MEQ tablet  Take 20 mEq by mouth daily.   Yes Historical Provider, MD  predniSONE (DELTASONE) 20 MG tablet Take 20 mg by mouth every other day.   Yes Historical Provider, MD  prochlorperazine (COMPAZINE) 10 MG tablet Take 1 tablet (10 mg total) by mouth every 6 (six) hours as needed. 06/22/11 06/29/11  [name], MD   Physical Exam: Filed Vitals:   02/22/12 1424 02/22/12 1538 02/22/12 1912  BP: 127/78 103/77 119/74  Pulse: 107 108 109  Temp: 98.5 F (36.9 C)  98.3 F (36.8 C)    TempSrc: Oral  Oral  Resp: 23 16 26   SpO2: 100% 100% 99%     General:  Alert and oriented x3, no acute distress, fatigued, looks older than stated age  Eyes: Sclera mildly icteric, extraocular movements intact  ENT: Cephalic, atraumatic, mucous membranes are dry  Neck: No thyromegaly, supple no JVD  Cardiovascular: Regular rhythm, mild tachycardia, soft 2/6 systolic ejection murmur  Respiratory: Decreased breath sounds throughout with mild bilateral end expiratory wheeze  Abdomen: Soft, nontender, nondistended, hypoactive bowel sounds  Skin: No skin tears or breaks  Musculoskeletal: Generalized symmetric weakness, 5 minus out of 5 on both sides upper and lower  Psychiatric: No psychoses, patient is appropriate  Neurologic: No focal neurological deficits  Labs on Admission:  Basic Metabolic Panel:  Lab 02/22/12 4098 02/22/12 1600  NA -- 137  K -- 3.1*  CL -- 106  CO2 -- 13*  GLUCOSE -- 104*  BUN -- 39*  CREATININE -- 1.91*  CALCIUM -- 9.5  MG 2.1 --  PHOS -- --   Liver Function Tests:  Lab 02/22/12 1600  AST 18  ALT 12  ALKPHOS 236*  BILITOT 0.4  PROT 7.0  ALBUMIN 2.1*   CBC:  Lab 02/22/12 1600  WBC 13.1*  NEUTROABS 9.5*  HGB 7.0*  HCT 20.5*  MCV 78.5  PLT 166   Cardiac Enzymes:  Lab 02/22/12 1630  CKTOTAL --  CKMB --  CKMBINDEX --  TROPONINI <0.30   BNP at 1118  Radiological Exams on Admission: Dg Chest 2 View  02/22/2012    Impression:  Further worsening of consolidation and volume loss in the right upper lobe.  Nm Pulmonary Per & Vent  02/22/2012  .  IMPRESSION: Low probability of pulmonary embolism.  No defects in the lung that is normally aerated at radiography.  Matched defect in the right upper lobe to the region of dense pulmonary infiltrate.  Original Report Authenticated By: Thomasenia Sales, M.D.    EKG: Independently reviewed. Sinus tachycardia with right atrial abnormality but within normal limits  Assessment/Plan Active  Problems:  Hodgkin's lymphoma in relapse: Patient currently getting active chemotherapy every other day. His chemotherapy drug is on hold given low blood level.  Metabolic acidosis: Could be from uremia given worsening renal function versus early severe infection. Have ordered pro calcitonin and lactic acid levels which are pending. Recheck labs in the morning. Patient is stable.   COPD (chronic obstructive pulmonary disease): Treat with nebulizers plus antibiotics plus steroids plus oxygen. See below.   Peripheral vascular disease with claudication: Stable. Patient has an allergy to aspirin as of now.   PNA (pneumonia): Patient was recently discharged from the hospital and so will treat as healthcare associated pneumonia with broad-spectrum antibiotics. Given that he was treated with the same regimen last admission, will go ahead and check a swallow evaluation.   HTN (hypertension): Hold antihypertensives. Patient mildly dehydrated.   Hyperlipidemia: Stable. Continue  statin.   Acute on chronic renal failure: We'll gently hydrate. I do not think it is elevated BNP status is from congestive heart failure, and more unlikely sign of acute renal failure.   Anemia: Suspect that this is from chronic disease. Patient received a blood transfusion during last admission. Transfuse again. He may need he begin and we'll defer this to oncology.   CKD (chronic kidney disease), stage III colon see above. Recommend referral to outpatient nephrology on discharge  Code Status: I discussed with the patient and he confirms that he is indeed a DO NOT RESUSCITATE.  Family Communication: Patient has no family nearby. His closest contact is a friend named Venita Lick: [phone]  Disposition Plan: Treat anemia, COPD and pneumonia. Likely will be in the hospital for at least the next 2-3 days.  Hollice Espy [facility] Pager [phone]  If 7PM-7AM, please contact  night-coverage [email or web address] Password [email or web address] 02/22/2012, 8:57 PM

## 2012-02-22 NOTE — Progress Notes (Signed)
ANTIBIOTIC CONSULT NOTE - INITIAL  Pharmacy Consult for VANCOMYCIN Indication: Suspected Pneumonia  Allergies  Allergen Reactions  . Aspirin Nausea Only    Patient Measurements: Height: 5\' 6"  (167.6 cm) Weight: 109 lb 9.1 oz (49.7 kg) IBW/kg (Calculated) : 63.8    Vital Signs: Temp: 98.3 F (36.8 C) (07/10 2213) Temp src: Oral (07/10 2213) BP: 101/68 mmHg (07/10 2213) Pulse Rate: 119  (07/10 2213) Intake/Output from previous day:   Intake/Output from this shift: Total I/O In: -  Out: 700 [Urine:700]  Labs:  Basename 02/22/12 1600  WBC 13.1*  HGB 7.0*  PLT 166  LABCREA --  CREATININE 1.91*   Estimated Creatinine Clearance: 28.9 ml/min (by C-G formula based on Cr of 1.91). No results found for this basename: VANCOTROUGH:2,VANCOPEAK:2,VANCORANDOM:2,GENTTROUGH:2,GENTPEAK:2,GENTRANDOM:2,TOBRATROUGH:2,TOBRAPEAK:2,TOBRARND:2,AMIKACINPEAK:2,AMIKACINTROU:2,AMIKACIN:2, in the last 72 hours   Microbiology: Recent Results (from the past 720 hour(s))  TECHNOLOGIST REVIEW     Status: Normal   Collection Time   01/24/12  3:53 PM      Component Value Range Status Comment   Technologist Review Rare meta, large and giant plts   Final   TECHNOLOGIST REVIEW     Status: Normal   Collection Time   01/26/12  8:18 AM      Component Value Range Status Comment   Technologist Review Occ Metas and Myelocytes present, rouleaux   Final   TECHNOLOGIST REVIEW     Status: Normal   Collection Time   02/01/12  7:45 AM      Component Value Range Status Comment   Technologist Review Metas and Myelocytes present   Final   URINE CULTURE     Status: Normal   Collection Time   02/07/12  3:18 PM      Component Value Range Status Comment   Specimen Description URINE, CLEAN CATCH   Final    Special Requests NONE   Final    Culture  Setup Time [id number]   Final    Colony Count [id number]   Final    Culture     Final    Value: Multiple bacterial morphotypes present, none predominant. Suggest  appropriate recollection if clinically indicated.   Report Status 02/08/2012 FINAL   Final   CULTURE, BLOOD (ROUTINE X 2)     Status: Normal   Collection Time   02/07/12  3:37 PM      Component Value Range Status Comment   Specimen Description BLOOD LEFT ANTECUBITAL   Final    Special Requests BOTTLES DRAWN AEROBIC AND ANAEROBIC 5 CC EACH   Final    Culture  Setup Time 02/08/2012 01:32   Final    Culture NO GROWTH 5 DAYS   Final    Report Status 02/14/2012 FINAL   Final   CULTURE, BLOOD (ROUTINE X 2)     Status: Normal   Collection Time   02/07/12  4:19 PM      Component Value Range Status Comment   Specimen Description BLOOD PORTA CATH   Final    Special Requests BOTTLES DRAWN AEROBIC AND ANAEROBIC [facility] EACH   Final    Culture  Setup Time 02/08/2012 01:32   Final    Culture NO GROWTH 5 DAYS   Final    Report Status 02/14/2012 FINAL   Final   MRSA PCR SCREENING     Status: Normal   Collection Time   02/08/12 12:52 AM      Component Value Range Status Comment   MRSA by PCR NEGATIVE  NEGATIVE Final  Medical History: Past Medical History  Diagnosis Date  . Dyslipidemia 2005  . Cancer     stomach  . Hypertension     No longer on BP pills x 4 years    Medications:  Scheduled:    . ceFEPime (MAXIPIME) IV  1 g Intravenous Q8H  . ipratropium  0.5 mg Nebulization Q8H  . levalbuterol  0.63 mg Nebulization Q6H  . levofloxacin (LEVAQUIN) IV  750 mg Intravenous Q24H  . methylPREDNISolone (SOLU-MEDROL) injection  40 mg Intravenous Q12H  . metoCLOPramide (REGLAN) injection  10 mg Intravenous Once  . multivitamin with minerals  1 tablet Oral Daily  . pantoprazole  40 mg Oral Daily  . potassium chloride SA  20 mEq Oral Daily  . potassium chloride  40 mEq Oral Once  . DISCONTD: ceFEPime (MAXIPIME) IV  1 g Intravenous Q8H  . DISCONTD: everolimus  10 mg Oral QODAY   Infusions:    . sodium chloride     Assessment:  61 year male with Hodgkins lymphoma, COPD with recent  hospitalization for COPD and pneumonia  Presents to ED today with complaint of shortness of breath  CrCl ~ 29 ml/min  IV Cefepime, Levaquin and Vancomycin to begin for suspected pneumonia  Pharmacy asked to dose vancomycin and to adjust other antibiotic dosing based on renal function  Goal of Therapy:  Vancomycin trough level 15-20 mcg/ml  Plan:   Change Cefepime to 1gm IV q24h x 8 days therapy  Change Levaquin to 750mg  q48h x 3 days therapy  Vancomycin 500mg  IV q24h x 8 days therapy  Monitor renal function & culture data  Check vancomycin trough level as needed  Louis Ivery, Joselyn Glassman, PharmD 02/22/2012,10:21 PM

## 2012-02-22 NOTE — ED Notes (Signed)
Contact when patient is back in room  Rudene Re Girlfriend [phone]

## 2012-02-22 NOTE — ED Notes (Signed)
Urinal given to pt 

## 2012-02-22 NOTE — ED Notes (Signed)
Pt states he has been short of breath with dizziness since Sunday, also reports hiccups, no distress noted in triage

## 2012-02-22 NOTE — ED Provider Notes (Signed)
Medical screening examination/treatment/procedure(s) were conducted as a shared visit with non-physician practitioner(s) and myself.  I personally evaluated the patient during the encounter  Pt with complex history.  Presents with recurrent cough and hiccough.  On exam, lungs CTA.  Overall appears cachectic and ill.  Will check labs, CXR and try treating his hiccough.    No PE on VQ.  Treated for recurrent pna.  May end up needing bronch.  Will transfuse blood, check guaic  Celene Kras, MD 02/22/12 2031

## 2012-02-23 ENCOUNTER — Inpatient Hospital Stay (HOSPITAL_COMMUNITY): Payer: Medicare Other

## 2012-02-23 DIAGNOSIS — N179 Acute kidney failure, unspecified: Secondary | ICD-10-CM

## 2012-02-23 DIAGNOSIS — C8190 Hodgkin lymphoma, unspecified, unspecified site: Secondary | ICD-10-CM

## 2012-02-23 LAB — CBC
Hemoglobin: 8.7 g/dL — ABNORMAL LOW (ref 13.0–17.0)
MCH: 28.5 pg (ref 26.0–34.0)
MCV: 82.6 fL (ref 78.0–100.0)
RBC: 3.05 MIL/uL — ABNORMAL LOW (ref 4.22–5.81)

## 2012-02-23 LAB — BASIC METABOLIC PANEL
CO2: 15 mEq/L — ABNORMAL LOW (ref 19–32)
Glucose, Bld: 250 mg/dL — ABNORMAL HIGH (ref 70–99)
Potassium: 3.5 mEq/L (ref 3.5–5.1)
Sodium: 138 mEq/L (ref 135–145)

## 2012-02-23 LAB — EXPECTORATED SPUTUM ASSESSMENT W GRAM STAIN, RFLX TO RESP C

## 2012-02-23 MED ORDER — ALUM & MAG HYDROXIDE-SIMETH 200-200-20 MG/5ML PO SUSP
30.0000 mL | Freq: Two times a day (BID) | ORAL | Status: DC | PRN
  Administered 2012-02-23: 30 mL via ORAL
  Filled 2012-02-23: qty 30

## 2012-02-23 MED ORDER — IPRATROPIUM BROMIDE 0.02 % IN SOLN
0.5000 mg | Freq: Four times a day (QID) | RESPIRATORY_TRACT | Status: DC
  Administered 2012-02-23 – 2012-02-27 (×17): 0.5 mg via RESPIRATORY_TRACT
  Filled 2012-02-23 (×17): qty 2.5

## 2012-02-23 MED ORDER — BOOST PLUS PO LIQD
237.0000 mL | Freq: Three times a day (TID) | ORAL | Status: DC
  Administered 2012-02-23 – 2012-02-27 (×11): 237 mL via ORAL
  Filled 2012-02-23 (×14): qty 237

## 2012-02-23 NOTE — Progress Notes (Signed)
SLP spoke to pt on phone to confirm understanding of information provided in writing.  Pt denied questions and stated understanding to information.    SLP to sign off, please reorder if pt's report of coughing with liquids does not abate with compensatory strategies provided for further assessment (option of MBS).  Thanks.    Keith Burnet, MS [facility] SLP [phone]

## 2012-02-23 NOTE — Progress Notes (Signed)
   CARE MANAGEMENT NOTE 02/23/2012  Patient:  Keith Bell, Keith Bell   Account Number:  [id number]  Date Initiated:  02/23/2012  Documentation initiated by:  Jiles Crocker  Subjective/Objective Assessment:   ADMITTED WITH Hodgkin's lymphoma in relapse, Metabolic acidosis     Action/Plan:   PCP IS DR Petra Kuba; LIVES ALONE, , MOD INDEPENDENT PRIOR TO ADMISSION   Anticipated DC Date:  03/01/2012   Anticipated DC Plan:  HOME/SELF CARE      DC Planning Services  CM consult            Status of service:  In process, will continue to follow Medicare Important Message given?  NA - LOS <3 / Initial given by admissions (If response is "NO", the following Medicare IM given date fields will be blank)  Per UR Regulation:  Reviewed for med. necessity/level of care/duration of stay  Comments:  02/23/2012- B Ricki Clack RN, BSN, MHA

## 2012-02-23 NOTE — Progress Notes (Signed)
INITIAL ADULT NUTRITION ASSESSMENT Date: 02/23/2012   Time: 10:55 AM Reason for Assessment: Nutrition Risk for unintentional weight loss > 10 lb over 1 month  ASSESSMENT: Male 61 y.o.  Dx: SOB  Hx:  Past Medical History  Diagnosis Date  . Dyslipidemia 2005  . Cancer     stomach  . Hypertension     No longer on BP pills x 4 years    Related Meds:  Scheduled Meds:   . ceFEPime (MAXIPIME) IV  1 g Intravenous Q24H  . ipratropium  0.5 mg Nebulization Q6H  . levalbuterol  0.63 mg Nebulization Q6H  . levofloxacin (LEVAQUIN) IV  750 mg Intravenous Q48H  . methylPREDNISolone (SOLU-MEDROL) injection  40 mg Intravenous Q12H  . metoCLOPramide (REGLAN) injection  10 mg Intravenous Once  . multivitamin with minerals  1 tablet Oral Daily  . pantoprazole  40 mg Oral Daily  . potassium chloride SA  20 mEq Oral Daily  . potassium chloride  40 mEq Oral Once  . vancomycin  500 mg Intravenous Q24H  . DISCONTD: ceFEPime (MAXIPIME) IV  1 g Intravenous Q8H  . DISCONTD: ceFEPime (MAXIPIME) IV  1 g Intravenous Q8H  . DISCONTD: everolimus  10 mg Oral QODAY  . DISCONTD: ipratropium  0.5 mg Nebulization Q8H  . DISCONTD: levofloxacin (LEVAQUIN) IV  750 mg Intravenous Q24H   Continuous Infusions:   . sodium chloride 75 mL/hr at 02/23/12 0944   PRN Meds:.heparin lock flush, heparin lock flush, HYDROcodone-acetaminophen, levalbuterol, sodium chloride, technetium albumin aggregated   Ht: 5\' 6"  (167.6 cm)  Wt: 109 lb 9.1 oz (49.7 kg)  Ideal Wt: 64.5 kg % Ideal Wt: 77% Wt Readings from Last 10 Encounters:  02/22/12 109 lb 9.1 oz (49.7 kg)  02/07/12 120 lb (54.432 kg)  01/24/12 122 lb 11.2 oz (55.656 kg)  01/17/12 131 lb (59.421 kg)  12/29/11 131 lb 1.6 oz (59.467 kg)  11/23/11 129 lb 9.6 oz (58.786 kg)  10/19/11 128 lb 12.8 oz (58.423 kg)  09/20/11 125 lb 14.4 oz (57.108 kg)  09/01/11 128 lb 1.6 oz (58.106 kg)  08/18/11 127 lb 6.4 oz (57.788 kg)  *Unintentional 22 lb weight loss over 1  month, 16.8% from baseline.   Body mass index is 17.68 kg/(m^2). (Underweight)  Food/Nutrition Related Hx: Patient appears very thin with apparent scapula wasting. Patient reported his appetite and intake are well. He reported he drinks 3 to 4 Boost nutrition supplements daily at home. Patient voiced snack preferences.   Labs:  CMP     Component Value Date/Time   NA 137 02/22/2012 1600   K 3.1* 02/22/2012 1600   CL 106 02/22/2012 1600   CO2 13* 02/22/2012 1600   GLUCOSE 104* 02/22/2012 1600   BUN 39* 02/22/2012 1600   CREATININE 1.91* 02/22/2012 1600   CALCIUM 9.5 02/22/2012 1600   PROT 7.0 02/22/2012 1600   ALBUMIN 2.1* 02/22/2012 1600   AST 18 02/22/2012 1600   ALT 12 02/22/2012 1600   ALKPHOS 236* 02/22/2012 1600   BILITOT 0.4 02/22/2012 1600   GFRNONAA 37* 02/22/2012 1600   GFRAA 42* 02/22/2012 1600    Intake/Output Summary (Last 24 hours) at 02/23/12 1057 Last data filed at 02/23/12 1046  Gross per 24 hour  Intake 1676.25 ml  Output   1500 ml  Net 176.25 ml     Diet Order: General  Supplements/Tube Feeding: none at this time.   IVF:    sodium chloride Last Rate: 75 mL/hr at 02/23/12 0944  Estimated Nutritional Needs:   Kcal: 1610-9604 Protein: 97-116 grams Fluid: 1 ml per kcal intake  NUTRITION DIAGNOSIS: -Increased nutrient needs (NI-5.1).  Status: Ongoing  RELATED TO: diagnosis of cancer and related treatment symptoms  AS EVIDENCE BY: patient with increased calorie and protein needs and patient underweight with BMI of 17.68 kg/m^2.   MONITORING/EVALUATION(Goals): PO intake, weights, labs 1. PO intake > 75% at meals, supplements and snacks. 2. Minimize further weight loss.   EDUCATION NEEDS: -No education needs identified at this time  INTERVENTION: 1. Will order patient Boost nutrition supplement TID. Provides 1080 kcal and 42 grams of protein daily.  2. Will order patient snacks BID to increase caloric intake.  3. RD to follow for nutrition plan of care.    Dietitian [phone]  DOCUMENTATION CODES Per approved criteria  -Severe malnutrition in the context of chronic illness -Underweight  *Patient meets malnutrition criteria due to >5% unintentional weight loss from baseline and apparent severe loss of both muscle mass and body fat.   Iven Finn [facility] 02/23/2012, 10:55 AM

## 2012-02-23 NOTE — Evaluation (Addendum)
Clinical/Bedside Swallow Evaluation Patient Details  Name: Keith Bell MRN: [id number] Date of Birth: 15-Nov-1950  Today's Date: 02/23/2012 Time: 7829-5621 SLP Time Calculation (min): 63 min  Past Medical History:  Past Medical History  Diagnosis Date  . Dyslipidemia 2005  . Cancer     stomach  . Hypertension     No longer on BP pills x 4 years   Past Surgical History:  Past Surgical History  Procedure Date  . No past surgeries    HPI:  61 yo male adm to [facility] 02/22/12 with dizziness, shortness of breath, hiccups.   PMH + gastric cancer five years ago, Hodgkin's disease, PVD, COPD.  Pt with chronic cough x months, associated with intake of liquids - stating at times with colored specs at times.  Pt receiving chemo by mouth - pills.     Assessment / Plan / Recommendation Clinical Impression  Pt observed consuming lunch, he acknowledges coughing with liquids over the last two months especially.  Pt does admit to taking large sips due to his xerostomia.  Small single boluses observed that did not result in coughing.  Hiccup noted x1 after water bolus with delayed productive cough x2.  Otherwise no clinical s/s of aspiration noted.  Pt denies symptoms of reflux or heartburn.    Rec continue soft/thin diet with aspiration precautions.  SLP to follow up x one to assure all education completed regarding maximizing airway protection with intake.  Dyspnea with intake also reported , slp advised pt to take rest breaks during meals as needed and cease intake if short of breath.  Begin meals with high nutrition foods first.  Also advised pt to consume water with meals and take SMALL SIPS to see if eliminates coughing.  Further question if pt's h/o gastric cancer impacts his clearance and may contribute to possible aspiration.      Aspiration Risk  Mild    Diet Recommendation Dysphagia 3 (Mechanical Soft) (pt ordering soft foods)   Liquid Administration via: Cup;Straw Medication Administration:  Other (Comment) (as tolerated with pudding and follow with liquid if problem) Compensations: Slow rate;Small sips/bites Postural Changes and/or Swallow Maneuvers: Seated upright 90 degrees;Upright 30-60 min after meal    Other  Recommendations Oral Care Recommendations: Oral care BID   Follow Up Recommendations       Frequency and Duration min 1 x/week  1 week   Pertinent Vitals/Pain Afebrile, lungs decreased    SLP Swallow Goals Patient will utilize recommended strategies during swallow to increase swallowing safety with: Minimal cueing   Swallow Study Prior Functional Status       General Date of Onset: 02/23/12 HPI: 61 yo male adm to [facility] 02/22/12 with dizziness, shortness of breath, hiccups.   PMH + gastric cancer five years ago, Hodgkin's disease, PVD, COPD.  Pt with chronic cough x months, associated with intake of liquids - stating at times with colored specs at times.  Pt receiving chemo by mouth - pills.   Type of Study: Bedside swallow evaluation Previous Swallow Assessment: none Diet Prior to this Study: Dysphagia 3 (soft);Thin liquids Temperature Spikes Noted: No Respiratory Status: Room air History of Recent Intubation: No Behavior/Cognition: Alert;Cooperative;Pleasant mood Oral Cavity - Dentition: Edentulous Self-Feeding Abilities: Able to feed self Patient Positioning: Upright in bed Baseline Vocal Quality: Clear Volitional Cough: Strong Volitional Swallow: Able to elicit    Oral/Motor/Sensory Function Overall Oral Motor/Sensory Function: Appears within functional limits for tasks assessed   Ice Chips Ice chips: Not tested  Thin Liquid Presentation: Cup;Straw Other Comments: cough x1 noted after hiccups - productive to viscous whitish secretions    Nectar Thick Nectar Thick Liquid: Within functional limits   Honey Thick Honey Thick Liquid: Not tested   Puree Puree: Within functional limits Presentation: Self Fed;Spoon   Solid Presentation: Self  Fed Pharyngeal Phase Impairments: Throat Clearing - Delayed Other Comments: minimal throat clearing noted with pt reporting sensation of stasis in pharynx, he states he eats soft foods to compensate    Donavan Burnet, MS [facility] SLP [phone]

## 2012-02-23 NOTE — Progress Notes (Signed)
[facility] PROGRESS NOTE  Keith Bell ZOX:[id number] DOB: [birth year] DOA: 02/22/2012 PCP: Eino Farber, MD  Assessment/Plan: Active Problems:  Hodgkin's lymphoma in relapse  COPD (chronic obstructive pulmonary disease)  Peripheral vascular disease with claudication  PNA (pneumonia)  HTN (hypertension)  Hyperlipidemia  Acute on chronic renal failure  Anemia  CKD (chronic kidney disease), stage III  Metabolic acidosis   Healthcare associated pneumonia -RUL infiltrates, patient was treated recently with 2 courses of antibiotics, this is third time. -I will get a CT scan of his chest without contrast to rule out any obstructive lesions. -Continue current antibiotics.  Metabolic acidosis -Can be likely to the infection or secondary to his uremia.  -Hydrate with IV fluids, check BMP in the morning.  Acute renal failure on background of CKD stage III -Patient baseline creatinine about 1.5, he was presented with creatinine of 1.9. -I will hydrate aggressively with IV fluids, check BMP in the morning.  Hodgkin lymphoma -Stage IVB Hodgkin's lymphoma, follows with Dr. Arline Asp. -Patient is on Afinitor, this is been on hold likely old restarted of discharge.  Peripheral vascular disease with claudication -Stable, not on aspirin secondary to aspirin allergy.  Code Status: DNR/DNI Family Communication:  Disposition Plan: Remains inpatient  Brief narrative: 60 year old African American male with stomach cancer and chronic kidney disease came to the hospital with worsening of shortness of breath, acidosis and findings of right upper lobe opacity.  Consultants:  None  Procedures:  None  Antibiotics:  Cefepime, vancomycin and levofloxacin  HPI/Subjective: Patient says she feels much better  Objective: Filed Vitals:   02/23/12 0515 02/23/12 0615 02/23/12 0630 02/23/12 0927  BP: 113/79 101/74 102/70   Pulse: 99 98 94   Temp: 97.8 F (36.6 C) 97.3 F  (36.3 C) 97.3 F (36.3 C)   TempSrc: Oral Oral Oral   Resp: 18 18 18    Height:      Weight:      SpO2:    98%    Intake/Output Summary (Last 24 hours) at 02/23/12 1155 Last data filed at 02/23/12 1046  Gross per 24 hour  Intake 1676.25 ml  Output   1500 ml  Net 176.25 ml    Exam:  General: Alert and awake, oriented x3, not in any acute distress. HEENT: anicteric sclera, pupils reactive to light and accommodation, EOMI CVS: S1-S2 clear, no murmur rubs or gallops Chest: clear to auscultation bilaterally, no wheezing, rales or rhonchi Abdomen: soft nontender, nondistended, normal bowel sounds, no organomegaly Extremities: no cyanosis, clubbing or edema noted bilaterally Neuro: Cranial nerves II-XII intact, no focal neurological deficits  Data Reviewed: Basic Metabolic Panel:  Lab 02/23/12 8119 02/22/12 1630 02/22/12 1600  NA 138 -- 137  K 3.5 -- 3.1*  CL 108 -- 106  CO2 15* -- 13*  GLUCOSE 250* -- 104*  BUN 33* -- 39*  CREATININE 1.77* -- 1.91*  CALCIUM 9.2 -- 9.5  MG -- 2.1 --  PHOS -- -- --   Liver Function Tests:  Lab 02/22/12 1600  AST 18  ALT 12  ALKPHOS 236*  BILITOT 0.4  PROT 7.0  ALBUMIN 2.1*   No results found for this basename: LIPASE:5,AMYLASE:5 in the last 168 hours No results found for this basename: AMMONIA:5 in the last 168 hours CBC:  Lab 02/23/12 1030 02/22/12 1600  WBC 8.1 13.1*  NEUTROABS -- 9.5*  HGB 8.7* 7.0*  HCT 25.2* 20.5*  MCV 82.6 78.5  PLT 133* 166   Cardiac Enzymes:  Lab  02/22/12 1630  CKTOTAL --  CKMB --  CKMBINDEX --  TROPONINI <0.30   BNP (last 3 results)  Basename 02/22/12 1600  PROBNP 1118.0*   CBG: No results found for this basename: GLUCAP:5 in the last 168 hours  Recent Results (from the past 240 hour(s))  MRSA PCR SCREENING     Status: Normal   Collection Time   02/22/12 10:54 PM      Component Value Range Status Comment   MRSA by PCR NEGATIVE  NEGATIVE Final      Studies: Dg Chest 2  View  02/22/2012  *RADIOLOGY REPORT*  Clinical Data: Short of breath.  Weakness and dizziness. Productive cough.  CHEST - 2 VIEW  Comparison: 02/07/2012 and multiple previous  Findings: Power port remains in place with its tip in the SVC 4 cm above the right atrium.  There is been further worsening of consolidation and volume loss in the right upper lobe.  The remainder of the lungs are clear.  No effusions.  No bony abnormalities.  Impression:  Further worsening of consolidation and volume loss in the right upper lobe.  Original Report Authenticated By: Thomasenia Sales, M.D.   Nm Pulmonary Per & Vent  02/22/2012  *RADIOLOGY REPORT*  Clinical Data: Elevated D-dimer.  Abnormal chest radiograph.  NM PULMONARY VENTILATION AND PERFUSION SCAN  Radiopharmaceutical: CURIE MAA TECHNETIUM TO 52M ALBUMIN AGGREGATED  Comparison: Chest radiography same day  Findings: Perfusion of the left lung is normal.  There is diminished perfusion of the right upper lobe, matching the dense pulmonary infiltrate.  No defects seen in the right lower lobe.  IMPRESSION: Low probability of pulmonary embolism.  No defects in the lung that is normally aerated at radiography.  Matched defect in the right upper lobe to the region of dense pulmonary infiltrate.  Original Report Authenticated By: Thomasenia Sales, M.D.   Dg Chest Port 1 View  02/07/2012  *RADIOLOGY REPORT*  Clinical Data: Cough, fever, history of gastric cancer  PORTABLE CHEST - 1 VIEW  Comparison: 01/17/2012; 04/29/2011; PET CT - 12/21/2011  Findings: Unchanged cardiac silhouette and mediastinal contours. Stable positioning of support apparatus.  Increased right upper lung heterogeneous air space opacities worrisome for infection. The left hemithorax is unchanged.  No definite pleural effusion.  A skin fold overlies the peripheral aspect of the left upper lung. No definite pneumothorax.  Unchanged bones.  IMPRESSION: Worsening right upper lung pneumonia.  A follow-up chest  radiograph in 4 to 6 weeks after treatment is recommended to ensure resolution.  Original Report Authenticated By: Waynard Reeds, M.D.    Scheduled Meds:   . ceFEPime (MAXIPIME) IV  1 g Intravenous Q24H  . ipratropium  0.5 mg Nebulization Q6H  . levalbuterol  0.63 mg Nebulization Q6H  . levofloxacin (LEVAQUIN) IV  750 mg Intravenous Q48H  . methylPREDNISolone (SOLU-MEDROL) injection  40 mg Intravenous Q12H  . metoCLOPramide (REGLAN) injection  10 mg Intravenous Once  . multivitamin with minerals  1 tablet Oral Daily  . pantoprazole  40 mg Oral Daily  . potassium chloride SA  20 mEq Oral Daily  . potassium chloride  40 mEq Oral Once  . vancomycin  500 mg Intravenous Q24H  . DISCONTD: ceFEPime (MAXIPIME) IV  1 g Intravenous Q8H  . DISCONTD: ceFEPime (MAXIPIME) IV  1 g Intravenous Q8H  . DISCONTD: everolimus  10 mg Oral QODAY  . DISCONTD: ipratropium  0.5 mg Nebulization Q8H  . DISCONTD: levofloxacin (LEVAQUIN) IV  750  mg Intravenous Q24H   Continuous Infusions:   . sodium chloride 75 mL/hr at 02/23/12 0944     [facility] Pager [phone]  If 7PM-7AM, please contact night-coverage [email or web address] Password [email or web address] 02/23/2012, 11:55 AM   LOS: 1 day

## 2012-02-24 ENCOUNTER — Encounter: Payer: Self-pay | Admitting: *Deleted

## 2012-02-24 ENCOUNTER — Other Ambulatory Visit: Payer: Self-pay

## 2012-02-24 ENCOUNTER — Ambulatory Visit: Payer: Self-pay | Admitting: Oncology

## 2012-02-24 DIAGNOSIS — J852 Abscess of lung without pneumonia: Principal | ICD-10-CM

## 2012-02-24 DIAGNOSIS — J850 Gangrene and necrosis of lung: Secondary | ICD-10-CM | POA: Diagnosis present

## 2012-02-24 LAB — TYPE AND SCREEN
Antibody Screen: NEGATIVE
Unit division: 0

## 2012-02-24 LAB — BASIC METABOLIC PANEL
BUN: 32 mg/dL — ABNORMAL HIGH (ref 6–23)
Calcium: 9.3 mg/dL (ref 8.4–10.5)
Creatinine, Ser: 1.67 mg/dL — ABNORMAL HIGH (ref 0.50–1.35)
GFR calc non Af Amer: 43 mL/min — ABNORMAL LOW (ref >90)
Glucose, Bld: 224 mg/dL — ABNORMAL HIGH (ref 70–99)
Potassium: 3.5 mEq/L (ref 3.5–5.1)

## 2012-02-24 LAB — CBC
Hemoglobin: 8.5 g/dL — ABNORMAL LOW (ref 13.0–17.0)
MCH: 28.4 pg (ref 26.0–34.0)
MCHC: 34.4 g/dL (ref 30.0–36.0)
RDW: 20 % — ABNORMAL HIGH (ref 11.5–15.5)

## 2012-02-24 LAB — GLUCOSE, CAPILLARY
Glucose-Capillary: 200 mg/dL — ABNORMAL HIGH (ref 70–99)
Glucose-Capillary: 208 mg/dL — ABNORMAL HIGH (ref 70–99)

## 2012-02-24 MED ORDER — GUAIFENESIN ER 600 MG PO TB12
1200.0000 mg | ORAL_TABLET | Freq: Two times a day (BID) | ORAL | Status: DC
  Administered 2012-02-24 – 2012-02-27 (×7): 1200 mg via ORAL
  Filled 2012-02-24 (×8): qty 2

## 2012-02-24 MED ORDER — PREDNISONE 20 MG PO TABS
20.0000 mg | ORAL_TABLET | ORAL | Status: DC
  Administered 2012-02-24 – 2012-02-26 (×2): 20 mg via ORAL
  Filled 2012-02-24 (×2): qty 1

## 2012-02-24 MED ORDER — VANCOMYCIN HCL 500 MG IV SOLR
500.0000 mg | Freq: Two times a day (BID) | INTRAVENOUS | Status: DC
  Administered 2012-02-24 – 2012-02-27 (×7): 500 mg via INTRAVENOUS
  Filled 2012-02-24 (×8): qty 500

## 2012-02-24 MED ORDER — PIPERACILLIN-TAZOBACTAM 3.375 G IVPB
3.3750 g | Freq: Three times a day (TID) | INTRAVENOUS | Status: DC
  Administered 2012-02-24 – 2012-02-27 (×9): 3.375 g via INTRAVENOUS
  Filled 2012-02-24 (×11): qty 50

## 2012-02-24 MED ORDER — INSULIN ASPART 100 UNIT/ML ~~LOC~~ SOLN
0.0000 [IU] | Freq: Three times a day (TID) | SUBCUTANEOUS | Status: DC
  Administered 2012-02-24 – 2012-02-25 (×4): 2 [IU] via SUBCUTANEOUS
  Administered 2012-02-25: 1 [IU] via SUBCUTANEOUS
  Administered 2012-02-26: 3 [IU] via SUBCUTANEOUS
  Administered 2012-02-27: 1 [IU] via SUBCUTANEOUS

## 2012-02-24 NOTE — Progress Notes (Signed)
[facility] PROGRESS NOTE  Keith Bell AVW:[id number] DOB: 08-28-1950 DOA: 02/22/2012 PCP: Eino Farber, MD  Assessment/Plan: Active Problems:  Hodgkin's lymphoma in relapse  COPD (chronic obstructive pulmonary disease)  Peripheral vascular disease with claudication  PNA (pneumonia)  HTN (hypertension)  Hyperlipidemia  Acute on chronic renal failure  Anemia  CKD (chronic kidney disease), stage III  Metabolic acidosis   Healthcare associated pneumonia -RUL infiltrates, patient was treated recently with 2 courses of antibiotics, this is third time. -CT scan showed no obstructive lesions, worsening right upper lower lobe pneumonia -Continue current antibiotics.  Metabolic acidosis -Can be likely to the infection or secondary to his uremia.  -Slowly improving, continue IV fluids, check BMP in the morning.  Acute renal failure on background of CKD stage III -Patient baseline creatinine about 1.0, he was presented with creatinine of 1.9. -Improving today, continue IV fluids, check BMP in the morning.  Hodgkin lymphoma -Stage IVB Hodgkin's lymphoma, follows with Dr. Arline Asp. -Patient is on Afinitor, this is been on hold likely old restarted of discharge.  Peripheral vascular disease with claudication -Stable, not on aspirin secondary to aspirin allergy.  Code Status: DNR/DNI Family Communication:  Disposition Plan: Remains inpatient  Brief narrative: 61 year old African American male with stomach cancer and chronic kidney disease came to the hospital with worsening of shortness of breath, acidosis and findings of right upper lobe opacity.  Consultants:  None  Procedures:  None  Antibiotics:  Cefepime, vancomycin and levofloxacin  HPI/Subjective: Patient says she feels much better  Objective: Filed Vitals:   02/24/12 0220 02/24/12 0500 02/24/12 0516 02/24/12 0914  BP:   105/67   Pulse:   89   Temp:   97.4 F (36.3 C)   TempSrc:   Oral     Resp:   20   Height:      Weight:  54.6 kg (120 lb 5.9 oz)    SpO2: 99%  99% 98%    Intake/Output Summary (Last 24 hours) at 02/24/12 1142 Last data filed at 02/24/12 0837  Gross per 24 hour  Intake    240 ml  Output   2050 ml  Net  -1810 ml    Exam:  General: Alert and awake, oriented x3, not in any acute distress. HEENT: anicteric sclera, pupils reactive to light and accommodation, EOMI CVS: S1-S2 clear, no murmur rubs or gallops Chest: clear to auscultation bilaterally, no wheezing, rales or rhonchi Abdomen: soft nontender, nondistended, normal bowel sounds, no organomegaly Extremities: no cyanosis, clubbing or edema noted bilaterally Neuro: Cranial nerves II-XII intact, no focal neurological deficits  Data Reviewed: Basic Metabolic Panel:  Lab 02/24/12 8295 02/23/12 1030 02/22/12 1630 02/22/12 1600  NA 140 138 -- 137  K 3.5 3.5 -- 3.1*  CL 110 108 -- 106  CO2 16* 15* -- 13*  GLUCOSE 224* 250* -- 104*  BUN 32* 33* -- 39*  CREATININE 1.67* 1.77* -- 1.91*  CALCIUM 9.3 9.2 -- 9.5  MG -- -- 2.1 --  PHOS -- -- -- --   Liver Function Tests:  Lab 02/22/12 1600  AST 18  ALT 12  ALKPHOS 236*  BILITOT 0.4  PROT 7.0  ALBUMIN 2.1*   No results found for this basename: LIPASE:5,AMYLASE:5 in the last 168 hours No results found for this basename: AMMONIA:5 in the last 168 hours CBC:  Lab 02/24/12 0448 02/23/12 1030 02/22/12 1600  WBC 9.4 8.1 13.1*  NEUTROABS -- -- 9.5*  HGB 8.5* 8.7* 7.0*  HCT 24.7*  25.2* 20.5*  MCV 82.6 82.6 78.5  PLT 127* 133* 166   Cardiac Enzymes:  Lab 02/22/12 1630  CKTOTAL --  CKMB --  CKMBINDEX --  TROPONINI <0.30   BNP (last 3 results)  Basename 02/22/12 1600  PROBNP 1118.0*   CBG: No results found for this basename: GLUCAP:5 in the last 168 hours  Recent Results (from the past 240 hour(s))  CULTURE, BLOOD (ROUTINE X 2)     Status: Normal (Preliminary result)   Collection Time   02/22/12 10:40 PM      Component Value Range  Status Comment   Specimen Description BLOOD RIGHT ANTECUBITAL   Final    Special Requests BOTTLES DRAWN AEROBIC AND ANAEROBIC 2CC   Final    Culture  Setup Time 02/23/2012 05:04   Final    Culture     Final    Value:        BLOOD CULTURE RECEIVED NO GROWTH TO DATE CULTURE WILL BE HELD FOR 5 DAYS BEFORE ISSUING A FINAL NEGATIVE REPORT   Report Status PENDING   Incomplete   MRSA PCR SCREENING     Status: Normal   Collection Time   02/22/12 10:54 PM      Component Value Range Status Comment   MRSA by PCR NEGATIVE  NEGATIVE Final   CULTURE, BLOOD (ROUTINE X 2)     Status: Normal (Preliminary result)   Collection Time   02/22/12 11:00 PM      Component Value Range Status Comment   Specimen Description BLOOD RIGHT ARM   Final    Special Requests BOTTLES DRAWN AEROBIC AND ANAEROBIC 5CC   Final    Culture  Setup Time 02/23/2012 05:04   Final    Culture     Final    Value:        BLOOD CULTURE RECEIVED NO GROWTH TO DATE CULTURE WILL BE HELD FOR 5 DAYS BEFORE ISSUING A FINAL NEGATIVE REPORT   Report Status PENDING   Incomplete   CULTURE, EXPECTORATED SPUTUM-ASSESSMENT     Status: Normal   Collection Time   02/23/12 10:46 AM      Component Value Range Status Comment   Specimen Description SPUTUM   Final    Special Requests Immunocompromised   Final    Sputum evaluation     Final    Value: THIS SPECIMEN IS ACCEPTABLE. RESPIRATORY CULTURE REPORT TO FOLLOW.   Report Status 02/23/2012 FINAL   Final   CULTURE, RESPIRATORY     Status: Normal (Preliminary result)   Collection Time   02/23/12 10:46 AM      Component Value Range Status Comment   Specimen Description SPUTUM   Final    Special Requests NONE   Final    Gram Stain     Final    Value: NO WBC SEEN     FEW SQUAMOUS EPITHELIAL CELLS PRESENT     FEW GRAM POSITIVE COCCI IN PAIRS     FEW GRAM POSITIVE RODS   Culture NORMAL OROPHARYNGEAL FLORA   Final    Report Status PENDING   Incomplete      Studies: Dg Chest 2 View  02/22/2012   *RADIOLOGY REPORT*  Clinical Data: Short of breath.  Weakness and dizziness. Productive cough.  CHEST - 2 VIEW  Comparison: 02/07/2012 and multiple previous  Findings: Power port remains in place with its tip in the SVC 4 cm above the right atrium.  There is been further worsening of consolidation and volume  loss in the right upper lobe.  The remainder of the lungs are clear.  No effusions.  No bony abnormalities.  Impression:  Further worsening of consolidation and volume loss in the right upper lobe.  Original Report Authenticated By: Thomasenia Sales, M.D.   Nm Pulmonary Per & Vent  02/22/2012  *RADIOLOGY REPORT*  Clinical Data: Elevated D-dimer.  Abnormal chest radiograph.  NM PULMONARY VENTILATION AND PERFUSION SCAN  Radiopharmaceutical: CURIE MAA TECHNETIUM TO 48M ALBUMIN AGGREGATED  Comparison: Chest radiography same day  Findings: Perfusion of the left lung is normal.  There is diminished perfusion of the right upper lobe, matching the dense pulmonary infiltrate.  No defects seen in the right lower lobe.  IMPRESSION: Low probability of pulmonary embolism.  No defects in the lung that is normally aerated at radiography.  Matched defect in the right upper lobe to the region of dense pulmonary infiltrate.  Original Report Authenticated By: Thomasenia Sales, M.D.   Dg Chest Port 1 View  02/07/2012  *RADIOLOGY REPORT*  Clinical Data: Cough, fever, history of gastric cancer  PORTABLE CHEST - 1 VIEW  Comparison: 01/17/2012; 04/29/2011; PET CT - 12/21/2011  Findings: Unchanged cardiac silhouette and mediastinal contours. Stable positioning of support apparatus.  Increased right upper lung heterogeneous air space opacities worrisome for infection. The left hemithorax is unchanged.  No definite pleural effusion.  A skin fold overlies the peripheral aspect of the left upper lung. No definite pneumothorax.  Unchanged bones.  IMPRESSION: Worsening right upper lung pneumonia.  A follow-up chest radiograph in 4 to  6 weeks after treatment is recommended to ensure resolution.  Original Report Authenticated By: Waynard Reeds, M.D.    Scheduled Meds:    . ceFEPime (MAXIPIME) IV  1 g Intravenous Q24H  . ipratropium  0.5 mg Nebulization Q6H  . lactose free nutrition  237 mL Oral TID WC  . levalbuterol  0.63 mg Nebulization Q6H  . levofloxacin (LEVAQUIN) IV  750 mg Intravenous Q48H  . methylPREDNISolone (SOLU-MEDROL) injection  40 mg Intravenous Q12H  . multivitamin with minerals  1 tablet Oral Daily  . pantoprazole  40 mg Oral Daily  . potassium chloride SA  20 mEq Oral Daily  . vancomycin  500 mg Intravenous Q12H  . DISCONTD: vancomycin  500 mg Intravenous Q24H   Continuous Infusions:    . sodium chloride 75 mL/hr at 02/23/12 2320     [facility] Pager [phone]  If 7PM-7AM, please contact night-coverage [email or web address] Password [email or web address] 02/24/2012, 11:42 AM   LOS: 2 days

## 2012-02-24 NOTE — Progress Notes (Signed)
ANTIBIOTIC CONSULT NOTE - FOLLOW UP  Pharmacy Consult for Vanco, Cefepime, Levaquin Indication: PNA  Allergies  Allergen Reactions  . Aspirin Nausea Only    Patient Measurements: Height: 5\' 6"  (167.6 cm) Weight: 120 lb 5.9 oz (54.6 kg) IBW/kg (Calculated) : 63.8   Vital Signs: Temp: 97.4 F (36.3 C) (07/12 0516) Temp src: Oral (07/12 0516) BP: 105/67 mmHg (07/12 0516) Pulse Rate: 89  (07/12 0516) Intake/Output from previous day: 07/11 0701 - 07/12 0700 In: 480 [P.O.:480] Out: 2150 [Urine:2150] Intake/Output from this shift: Total I/O In: -  Out: 350 [Urine:350]  Labs:  [facility] 02/24/12 0448 02/23/12 1030 02/22/12 1600  WBC 9.4 8.1 13.1*  HGB 8.5* 8.7* 7.0*  PLT 127* 133* 166  LABCREA -- -- --  CREATININE 1.67* 1.77* 1.91*   Estimated Creatinine Clearance: 36.3 ml/min (by C-G formula based on Cr of 1.67). No results found for this basename: VANCOTROUGH:2,VANCOPEAK:2,VANCORANDOM:2,GENTTROUGH:2,GENTPEAK:2,GENTRANDOM:2,TOBRATROUGH:2,TOBRAPEAK:2,TOBRARND:2,AMIKACINPEAK:2,AMIKACINTROU:2,AMIKACIN:2, in the last 72 hours   Microbiology: Recent Results (from the past 720 hour(s))  TECHNOLOGIST REVIEW     Status: Normal   Collection Time   01/26/12  8:18 AM      Component Value Range Status Comment   Technologist Review Occ Metas and Myelocytes present, rouleaux   Final   TECHNOLOGIST REVIEW     Status: Normal   Collection Time   02/01/12  7:45 AM      Component Value Range Status Comment   Technologist Review Metas and Myelocytes present   Final   URINE CULTURE     Status: Normal   Collection Time   02/07/12  3:18 PM      Component Value Range Status Comment   Specimen Description URINE, CLEAN CATCH   Final    Special Requests NONE   Final    Culture  Setup Time [id number]   Final    Colony Count [id number]   Final    Culture     Final    Value: Multiple bacterial morphotypes present, none predominant. Suggest appropriate recollection if clinically indicated.   Report Status 02/08/2012 FINAL   Final   CULTURE, BLOOD (ROUTINE X 2)     Status: Normal   Collection Time   02/07/12  3:37 PM      Component Value Range Status Comment   Specimen Description BLOOD LEFT ANTECUBITAL   Final    Special Requests BOTTLES DRAWN AEROBIC AND ANAEROBIC 5 CC EACH   Final    Culture  Setup Time 02/08/2012 01:32   Final    Culture NO GROWTH 5 DAYS   Final    Report Status 02/14/2012 FINAL   Final   CULTURE, BLOOD (ROUTINE X 2)     Status: Normal   Collection Time   02/07/12  4:19 PM      Component Value Range Status Comment   Specimen Description BLOOD PORTA CATH   Final    Special Requests BOTTLES DRAWN AEROBIC AND ANAEROBIC 5CC EACH   Final    Culture  Setup Time 02/08/2012 01:32   Final    Culture NO GROWTH 5 DAYS   Final    Report Status 02/14/2012 FINAL   Final   MRSA PCR SCREENING     Status: Normal   Collection Time   02/08/12 12:52 AM      Component Value Range Status Comment   MRSA by PCR NEGATIVE  NEGATIVE Final   CULTURE, BLOOD (ROUTINE X 2)     Status: Normal (Preliminary result)   Collection Time  02/22/12 10:40 PM      Component Value Range Status Comment   Specimen Description BLOOD RIGHT ANTECUBITAL   Final    Special Requests BOTTLES DRAWN AEROBIC AND ANAEROBIC 2CC   Final    Culture  Setup Time 02/23/2012 05:04   Final    Culture     Final    Value:        BLOOD CULTURE RECEIVED NO GROWTH TO DATE CULTURE WILL BE HELD FOR 5 DAYS BEFORE ISSUING A FINAL NEGATIVE REPORT   Report Status PENDING   Incomplete   MRSA PCR SCREENING     Status: Normal   Collection Time   02/22/12 10:54 PM      Component Value Range Status Comment   MRSA by PCR NEGATIVE  NEGATIVE Final   CULTURE, BLOOD (ROUTINE X 2)     Status: Normal (Preliminary result)   Collection Time   02/22/12 11:00 PM      Component Value Range Status Comment   Specimen Description BLOOD RIGHT ARM   Final    Special Requests BOTTLES DRAWN AEROBIC AND ANAEROBIC 5CC   Final    Culture  Setup  Time 02/23/2012 05:04   Final    Culture     Final    Value:        BLOOD CULTURE RECEIVED NO GROWTH TO DATE CULTURE WILL BE HELD FOR 5 DAYS BEFORE ISSUING A FINAL NEGATIVE REPORT   Report Status PENDING   Incomplete   CULTURE, EXPECTORATED SPUTUM-ASSESSMENT     Status: Normal   Collection Time   02/23/12 10:46 AM      Component Value Range Status Comment   Specimen Description SPUTUM   Final    Special Requests Immunocompromised   Final    Sputum evaluation     Final    Value: THIS SPECIMEN IS ACCEPTABLE. RESPIRATORY CULTURE REPORT TO FOLLOW.   Report Status 02/23/2012 FINAL   Final   CULTURE, RESPIRATORY     Status: Normal (Preliminary result)   Collection Time   02/23/12 10:46 AM      Component Value Range Status Comment   Specimen Description SPUTUM   Final    Special Requests NONE   Final    Gram Stain     Final    Value: NO WBC SEEN     FEW SQUAMOUS EPITHELIAL CELLS PRESENT     FEW GRAM POSITIVE COCCI IN PAIRS     FEW GRAM POSITIVE RODS   Culture PENDING   Incomplete    Report Status PENDING   Incomplete     Anti-infectives     Start     Dose/Rate Route Frequency Ordered Stop   02/24/12 1000   vancomycin (VANCOCIN) 500 mg in sodium chloride 0.9 % 100 mL IVPB        500 mg 100 mL/hr over 60 Minutes Intravenous Every 12 hours 02/24/12 0904 03/01/12 2359   02/23/12 1800   ceFEPIme (MAXIPIME) 1 g in dextrose 5 % 50 mL IVPB        1 g 100 mL/hr over 30 Minutes Intravenous Every 24 hours 02/22/12 2232 03/01/12 1759   02/22/12 2300   levofloxacin (LEVAQUIN) IVPB 750 mg  Status:  Discontinued        750 mg 100 mL/hr over 90 Minutes Intravenous Every 24 hours 02/22/12 2210 02/22/12 2230   02/22/12 2300   levofloxacin (LEVAQUIN) IVPB 750 mg        750 mg 100 mL/hr over  90 Minutes Intravenous Every 48 hours 02/22/12 2230 02/26/12 2259   02/22/12 2300   vancomycin (VANCOCIN) 500 mg in sodium chloride 0.9 % 100 mL IVPB  Status:  Discontinued        500 mg 100 mL/hr over 60  Minutes Intravenous Every 24 hours 02/22/12 2232 02/24/12 0904   02/22/12 2215   ceFEPIme (MAXIPIME) 1 g in dextrose 5 % 50 mL IVPB  Status:  Discontinued        1 g 100 mL/hr over 30 Minutes Intravenous 3 times per day 02/22/12 2210 02/22/12 2214   02/22/12 1800   ceFEPIme (MAXIPIME) 1 g in dextrose 5 % 50 mL IVPB  Status:  Discontinued        1 g 100 mL/hr over 30 Minutes Intravenous Every 8 hours 02/22/12 1714 02/22/12 2231          Assessment: 61 year male with Hodgkins lymphoma, COPD with recent hospitalization for COPD and pneumonia, admitted on 7/10. 7/11 chest CT shows right upper and right lower lobe pneumonia. SCr slowly improving. Will adjust Vanco to a12h for normalized CrCl ~39ml/min. Cefepime and Levaquin doses remain appropriate for now. Plan is 8 days Vanc/Cefepime, 3 days Levaquin.  Goal of Therapy:  Vancomycin trough level 15-20 mcg/ml  Plan:  1) Change Vanco to 500mg  IV q12h 2) No changes to cefepime or Levaquin  Darrol Angel, PharmD Pager: [phone] 02/24/2012,9:06 AM

## 2012-02-24 NOTE — Progress Notes (Signed)
  Pharmacy Note (Brief) - Afinitor on Hold  Home everolimus (Afinitor) remains on hold due to active infections (PNA). See below for approved P&T Hold Criteria:  Everolimus (Afinitor; [employer]) Hold Criteria - Hgb < 8 - ANC < 1000 - Pltc < 50K - SCr > 1.5x baseline (or > 2 if baseline unknown) - Infection - Unexplained pneumonitis / hypoxemia   Will continue to hold Afinitor. If/when patient no longer meets hold criteria, will need Oncologist to reassess if appropriate to resume.  Darrol Angel, PharmD Pager: [phone] 02/24/2012 9:13 AM

## 2012-02-24 NOTE — Progress Notes (Signed)
   Discussed with Dr. Arline Asp, his primary oncologist.  We decided to call pulmonary as this is her third admission with pneumonia.  Clint Lipps Pager: [phone] 02/24/2012, 11:57 AM

## 2012-02-24 NOTE — Progress Notes (Signed)
Inpatient Diabetes Program Recommendations  AACE/ADA: New Consensus Statement on Inpatient Glycemic Control (2009)  Target Ranges:  Prepandial:   less than 140 mg/dL      Peak postprandial:   less than 180 mg/dL (1-2 hours)      Critically ill patients:  140 - 180 mg/dL   Reason for Visit: Hyperglycemia  Inpatient Diabetes Program Recommendations Diet: Would benefit from carbohydrate modified diet while on steroids. Actually, HgbA1C at 6.9% which is classified as "ati risk for diabetes" or "pre-diabetes"  Car mod diet would be beneficial.  Note: Thank you, Lenor Coffin, RN, CNS, Diabetes Coordinator [phone])

## 2012-02-24 NOTE — Progress Notes (Signed)
ANTIBIOTIC CONSULT NOTE - INITIAL  Pharmacy Consult for Zosyn Indication: Pulm Coverage (PNA vs COPD)  Allergies  Allergen Reactions  . Aspirin Nausea Only    Patient Measurements: Height: 5\' 6"  (167.6 cm) Weight: 120 lb 5.9 oz (54.6 kg) IBW/kg (Calculated) : 63.8   Vital Signs: Temp: 97.4 F (36.3 C) (07/12 1424) Temp src: Oral (07/12 1424) BP: 117/74 mmHg (07/12 1424) Pulse Rate: 68  (07/12 1424) Intake/Output from previous day: 07/11 0701 - 07/12 0700 In: 480 [P.O.:480] Out: 2150 [Urine:2150] Intake/Output from this shift: Total I/O In: 600 [P.O.:600] Out: 1300 [Urine:1300]  Labs:  Basename 02/24/12 0448 02/23/12 1030 02/22/12 1600  WBC 9.4 8.1 13.1*  HGB 8.5* 8.7* 7.0*  PLT 127* 133* 166  LABCREA -- -- --  CREATININE 1.67* 1.77* 1.91*   Estimated Creatinine Clearance: 36.3 ml/min (by C-G formula based on Cr of 1.67). No results found for this basename: VANCOTROUGH:2,VANCOPEAK:2,VANCORANDOM:2,GENTTROUGH:2,GENTPEAK:2,GENTRANDOM:2,TOBRATROUGH:2,TOBRAPEAK:2,TOBRARND:2,AMIKACINPEAK:2,AMIKACINTROU:2,AMIKACIN:2, in the last 72 hours   Microbiology: Recent Results (from the past 720 hour(s))  TECHNOLOGIST REVIEW     Status: Normal   Collection Time   01/26/12  8:18 AM      Component Value Range Status Comment   Technologist Review Occ Metas and Myelocytes present, rouleaux   Final   TECHNOLOGIST REVIEW     Status: Normal   Collection Time   02/01/12  7:45 AM      Component Value Range Status Comment   Technologist Review Metas and Myelocytes present   Final   URINE CULTURE     Status: Normal   Collection Time   02/07/12  3:18 PM      Component Value Range Status Comment   Specimen Description URINE, CLEAN CATCH   Final    Special Requests NONE   Final    Culture  Setup Time [id number]   Final    Colony Count [id number]   Final    Culture     Final    Value: Multiple bacterial morphotypes present, none predominant. Suggest appropriate recollection if clinically  indicated.   Report Status 02/08/2012 FINAL   Final   CULTURE, BLOOD (ROUTINE X 2)     Status: Normal   Collection Time   02/07/12  3:37 PM      Component Value Range Status Comment   Specimen Description BLOOD LEFT ANTECUBITAL   Final    Special Requests BOTTLES DRAWN AEROBIC AND ANAEROBIC 5 CC EACH   Final    Culture  Setup Time 02/08/2012 01:32   Final    Culture NO GROWTH 5 DAYS   Final    Report Status 02/14/2012 FINAL   Final   CULTURE, BLOOD (ROUTINE X 2)     Status: Normal   Collection Time   02/07/12  4:19 PM      Component Value Range Status Comment   Specimen Description BLOOD PORTA CATH   Final    Special Requests BOTTLES DRAWN AEROBIC AND ANAEROBIC 5CC EACH   Final    Culture  Setup Time 02/08/2012 01:32   Final    Culture NO GROWTH 5 DAYS   Final    Report Status 02/14/2012 FINAL   Final   MRSA PCR SCREENING     Status: Normal   Collection Time   02/08/12 12:52 AM      Component Value Range Status Comment   MRSA by PCR NEGATIVE  NEGATIVE Final   CULTURE, BLOOD (ROUTINE X 2)     Status: Normal (Preliminary result)   Collection  Time   02/22/12 10:40 PM      Component Value Range Status Comment   Specimen Description BLOOD RIGHT ANTECUBITAL   Final    Special Requests BOTTLES DRAWN AEROBIC AND ANAEROBIC 2CC   Final    Culture  Setup Time 02/23/2012 05:04   Final    Culture     Final    Value:        BLOOD CULTURE RECEIVED NO GROWTH TO DATE CULTURE WILL BE HELD FOR 5 DAYS BEFORE ISSUING A FINAL NEGATIVE REPORT   Report Status PENDING   Incomplete   MRSA PCR SCREENING     Status: Normal   Collection Time   02/22/12 10:54 PM      Component Value Range Status Comment   MRSA by PCR NEGATIVE  NEGATIVE Final   CULTURE, BLOOD (ROUTINE X 2)     Status: Normal (Preliminary result)   Collection Time   02/22/12 11:00 PM      Component Value Range Status Comment   Specimen Description BLOOD RIGHT ARM   Final    Special Requests BOTTLES DRAWN AEROBIC AND ANAEROBIC 5CC   Final     Culture  Setup Time 02/23/2012 05:04   Final    Culture     Final    Value:        BLOOD CULTURE RECEIVED NO GROWTH TO DATE CULTURE WILL BE HELD FOR 5 DAYS BEFORE ISSUING A FINAL NEGATIVE REPORT   Report Status PENDING   Incomplete   CULTURE, EXPECTORATED SPUTUM-ASSESSMENT     Status: Normal   Collection Time   02/23/12 10:46 AM      Component Value Range Status Comment   Specimen Description SPUTUM   Final    Special Requests Immunocompromised   Final    Sputum evaluation     Final    Value: THIS SPECIMEN IS ACCEPTABLE. RESPIRATORY CULTURE REPORT TO FOLLOW.   Report Status 02/23/2012 FINAL   Final   CULTURE, RESPIRATORY     Status: Normal (Preliminary result)   Collection Time   02/23/12 10:46 AM      Component Value Range Status Comment   Specimen Description SPUTUM   Final    Special Requests NONE   Final    Gram Stain     Final    Value: NO WBC SEEN     FEW SQUAMOUS EPITHELIAL CELLS PRESENT     FEW GRAM POSITIVE COCCI IN PAIRS     FEW GRAM POSITIVE RODS   Culture NORMAL OROPHARYNGEAL FLORA   Final    Report Status PENDING   Incomplete     Medical History: Past Medical History  Diagnosis Date  . Dyslipidemia 2005  . Cancer     stomach  . Hypertension     No longer on BP pills x 4 years    Medications:  Anti-infectives     Start     Dose/Rate Route Frequency Ordered Stop   02/24/12 1000   vancomycin (VANCOCIN) 500 mg in sodium chloride 0.9 % 100 mL IVPB        500 mg 100 mL/hr over 60 Minutes Intravenous Every 12 hours 02/24/12 0904 03/01/12 2359   02/23/12 1800   ceFEPIme (MAXIPIME) 1 g in dextrose 5 % 50 mL IVPB  Status:  Discontinued        1 g 100 mL/hr over 30 Minutes Intravenous Every 24 hours 02/22/12 2232 02/24/12 1425   02/22/12 2300   levofloxacin (LEVAQUIN) IVPB 750 mg  Status:  Discontinued        750 mg 100 mL/hr over 90 Minutes Intravenous Every 24 hours 02/22/12 2210 02/22/12 2230   02/22/12 2300   levofloxacin (LEVAQUIN) IVPB 750 mg        750  mg 100 mL/hr over 90 Minutes Intravenous Every 48 hours 02/22/12 2230 02/26/12 2259   02/22/12 2300   vancomycin (VANCOCIN) 500 mg in sodium chloride 0.9 % 100 mL IVPB  Status:  Discontinued        500 mg 100 mL/hr over 60 Minutes Intravenous Every 24 hours 02/22/12 2232 02/24/12 0904   02/22/12 2215   ceFEPIme (MAXIPIME) 1 g in dextrose 5 % 50 mL IVPB  Status:  Discontinued        1 g 100 mL/hr over 30 Minutes Intravenous 3 times per day 02/22/12 2210 02/22/12 2214   02/22/12 1800   ceFEPIme (MAXIPIME) 1 g in dextrose 5 % 50 mL IVPB  Status:  Discontinued        1 g 100 mL/hr over 30 Minutes Intravenous Every 8 hours 02/22/12 1714 02/22/12 2231         Assessment: 61 yo M with recurrent RUL necrotizing PNA in setting of Hodgkin's with incomplete treatment (per CCM note), currently on Day# 3 Vanco and Levaquin, now switching from cefepime to Zosyn.  Goal of Therapy:  Vancomycin trough level 15-20 mcg/ml  Plan:  Zosyn 3.375g IV q8h (4 hour infusion)  Darrol Angel, PharmD Pager: [phone] 02/24/2012,2:27 PM

## 2012-02-24 NOTE — Consult Note (Signed)
Name: Keith Bell MRN: [id number] DOB: 1950-10-30    LOS: 2  Referring Provider: Murinson Reason for Referral:  Recurrent Pna  PULMONARY / CRITICAL CARE MEDICINE  HPI:  61 yo AAM Dx with stage 4b Hodgkin's lymphoma 4/09 and underwent chemotherapy with remission. He had reoccurrence in 2/10 with partial response to tx. He has underlying COPD and smoked up until admission 02/07/12 for rul/rll pna. Tx with Vancomycin and Zoysn and discharged home 6/28. He returns 7/10 again with fever ,chills sweats and white sputum production. He feels like he never recovered fully from original pna.  PCCM asked to evaluate 7/12.  Past Medical History  Diagnosis Date  . Dyslipidemia 2005  . Cancer     stomach  . Hypertension     No longer on BP pills x 4 years   Past Surgical History  Procedure Date  . No past surgeries    Prior to Admission medications   Medication Sig Start Date End Date Taking? Authorizing Provider  everolimus (AFINITOR) 10 MG tablet Take 10 mg by mouth every other day. Take 1 po every  Other day   Yes Historical Provider, MD  HYDROcodone-acetaminophen (VICODIN) 5-500 MG per tablet Take 1 tablet by mouth every 6 (six) hours as needed. 02/10/12  Yes Alison Murray, MD  levofloxacin (LEVAQUIN) 500 MG tablet Take 500 mg by mouth daily. 02/14/12 02/24/12 Yes Historical Provider, MD  Multiple Vitamin (MULTIVITAMIN WITH MINERALS) TABS Take 1 tablet by mouth daily.   Yes Historical Provider, MD  ondansetron (ZOFRAN) 8 MG tablet Take 8 mg by mouth every 8 (eight) hours as needed. For nausea 02/10/12  Yes Alison Murray, MD  pantoprazole (PROTONIX) 40 MG tablet Take 1 tablet (40 mg total) by mouth daily. 09/01/11 08/31/12 Yes Gerarda Fraction Murinson, MD  potassium chloride SA (K-DUR,KLOR-CON) 20 MEQ tablet Take 20 mEq by mouth daily.   Yes Historical Provider, MD  predniSONE (DELTASONE) 20 MG tablet Take 20 mg by mouth every other day.   Yes Historical Provider, MD  prochlorperazine (COMPAZINE) 10 MG  tablet Take 1 tablet (10 mg total) by mouth every 6 (six) hours as needed. 06/22/11 06/29/11  [name], MD   Allergies Allergies  Allergen Reactions  . Aspirin Nausea Only    Family History Family History  Problem Relation Age of Onset  . Heart disease Mother   . Heart disease Father   . Cancer Father   . Cancer Sister   . Cancer Sister    Social History  reports that he quit smoking about 3 weeks ago. He has never used smokeless tobacco. He reports that he does not drink alcohol or use illicit drugs.  Review Of Systems:  Taken extensively see HPI  Brief patient description:  Frail thin AAM  Events Since Admission:   Current Status: NAD Vital Signs: Temp:  [97.4 F (36.3 C)-97.6 F (36.4 C)] 97.4 F (36.3 C) (07/12 0516) Pulse Rate:  [88-89] 89  (07/12 0516) Resp:  [18-20] 20  (07/12 0516) BP: (105-109)/(67-72) 105/67 mmHg (07/12 0516) SpO2:  [98 %-100 %] 98 % (07/12 0914) Weight:  [120 lb 5.9 oz (54.6 kg)] 120 lb 5.9 oz (54.6 kg) (07/12 0500)  Physical Examination: General:  Thin frail AAM, NAD @rest  Neuro: Intact HEENT:  No adenopathy  Neck:  No jvd Cardiovascular:  hsr rrr Lungs: coarse rhonchi with expiratory wheeze and decreased laminar flow and on the right Abdomen:  Soft + bs Musculoskeletal:  Intact, wasted muscular. Skin:  intact  Active Problems:  Hodgkin's lymphoma in relapse  COPD (chronic obstructive pulmonary disease)  Peripheral vascular disease with claudication  PNA (pneumonia)  HTN (hypertension)  Hyperlipidemia  Acute on chronic renal failure  Anemia  CKD (chronic kidney disease), stage III  Metabolic acidosis   ASSESSMENT AND PLAN  PULMONARY No results found for this basename: PHART:5,PCO2:5,PCO2ART:5,PO2ART:5,HCO3:5,O2SAT:5 in the last 168 hours    CXR:  Dg Chest 2 View  02/22/2012  *RADIOLOGY REPORT*  Clinical Data: Short of breath.  Weakness and dizziness. Productive cough.  CHEST - 2 VIEW  Comparison: 02/07/2012  and multiple previous  Findings: Power port remains in place with its tip in the SVC 4 cm above the right atrium.  There is been further worsening of consolidation and volume loss in the right upper lobe.  The remainder of the lungs are clear.  No effusions.  No bony abnormalities.  Impression:  Further worsening of consolidation and volume loss in the right upper lobe.  Original Report Authenticated By: Thomasenia Sales, M.D.   Ct Chest Wo Contrast  02/23/2012  *RADIOLOGY REPORT*  Clinical Data: Right lower lobe infiltrate.  Non Hodgkin lymphoma. Shortness of breath.  CT CHEST WITHOUT CONTRAST  Technique:  Multidetector CT imaging of the chest was performed following the standard protocol without IV contrast.  Comparison: PET 12/21/2011. CT chest 12/18/2008.  Findings: No pathologically enlarged mediastinal or axillary lymph nodes.  The hilar regions are difficult to definitively evaluate without IV contrast.  Coronary artery calcification.  Heart size normal.  No pericardial effusion.  Emphysema.  Airspace consolidation with internal rounded lucencies involving the majority of the anterior and posterior segment of the right upper lobe.  Associated air bronchograms.  Additional consolidation is seen in the medial aspect of the right lower lobe, with at least one air-fluid level (image 30), possibly with a previous site of bullous disease, as seen on 12/18/2008. Additional scattered peribronchovascular nodularity and mild consolidation in the right lower lobe, inferiorly.  7 mm subpleural left upper lobe nodule (image 36) is unchanged from 12/18/2008, indicating a benign etiology.  No pleural fluid.  Airway is unremarkable.  Incidental imaging of the upper abdomen shows no acute findings. No worrisome lytic or sclerotic lesions.  IMPRESSION:  Right upper and right lower lobe pneumonia. Associated fluid in multiple bullous lesions in the right upper and right lower lobes.  Original Report Authenticated By: Reyes Ivan, M.D.   Nm Pulmonary Per & Vent  02/22/2012  *RADIOLOGY REPORT*  Clinical Data: Elevated D-dimer.  Abnormal chest radiograph.  NM PULMONARY VENTILATION AND PERFUSION SCAN  Radiopharmaceutical: CURIE MAA TECHNETIUM TO 21M ALBUMIN AGGREGATED  Comparison: Chest radiography same day  Findings: Perfusion of the left lung is normal.  There is diminished perfusion of the right upper lobe, matching the dense pulmonary infiltrate.  No defects seen in the right lower lobe.  IMPRESSION: Low probability of pulmonary embolism.  No defects in the lung that is normally aerated at radiography.  Matched defect in the right upper lobe to the region of dense pulmonary infiltrate.  Original Report Authenticated By: Thomasenia Sales, M.D.      A:  Recurrent rt side  Necrotizing pna in setting of Hodgkin's with incomplete treatment.         COPD P:   Continue current tx When discharged would use Augmentin. Radiographic follow up as outpatient. Add bronchodilators to outpatient pharmaceutical regimen.  No clear role for FOB at this time.  Lab 02/24/12 0448 02/23/12 1030 02/22/12 1630 02/22/12 1600  NA 140 138 -- 137  K 3.5 3.5 -- --  CL 110 108 -- 106  CO2 16* 15* -- 13*  BUN 32* 33* -- 39*  CREATININE 1.67* 1.77* -- 1.91*  CALCIUM 9.3 9.2 -- 9.5  MG -- -- 2.1 --  PHOS -- -- -- --    HEMATOLOGIC  Lab 02/24/12 0448 02/23/12 1030 02/22/12 1600  HGB 8.5* 8.7* 7.0*  HCT 24.7* 25.2* 20.5*  PLT 127* 133* 166  INR -- -- --  APTT -- -- --   A: Lymphoma P:  Per hemonc  INFECTIOUS  Lab 02/24/12 0448 02/23/12 1030 02/22/12 2030 02/22/12 1600  WBC 9.4 8.1 -- 13.1*  PROCALCITON -- -- 3.27 --   Cultures: 7/11 sput>>nl flora 7/10 bcx2>> Antibiotics: 7/10 levaq>> 7/11 maxipime>> 7/12 7/12 vanco>> 7/12 Zosyn >>   A: pna P:   See flows   Brett Canales Minor ACNP Adolph Pollack PCCM Pager [phone] till 3 pm If no answer page [phone] 02/24/2012, 12:54 PM   PULM ATTENDING I have seen,  examined and reviewed database. This is a severe RUL necrotizing PNA that has been incompletely treated. The CT chest shows prominent air bronchograms in the areas of consolidation so I strongly doubt that it is post obstructive. He is immunocompromised so opportunistic infections are possible including fungal and AFB but this is most likely bacterial.  I have changed Cefepime to Zosyn to provide better anaerobic coverage. I would continue current abx through [facility]. Levofloxacin can be D/C'd if Legionella antigen is negative. Pulmonary medicine will follow up Monday 7/15. Ultimately he will need a prolonged course of abx in a manner similar to treating a lunb abscess (4-6 wks). These infections are usually polymicrobial but Augmentin is almost always sufficient for the long course. He will need Pulmonary outpt follow up with serial radiographs to ensure resolution.  Please call this [facility] if we can be of further assistance   Billy Fischer, MD ; [facility] [phone].  After 5:30 PM or weekends, call [phone]

## 2012-02-25 DIAGNOSIS — R7309 Other abnormal glucose: Secondary | ICD-10-CM

## 2012-02-25 LAB — GLUCOSE, CAPILLARY
Glucose-Capillary: 152 mg/dL — ABNORMAL HIGH (ref 70–99)
Glucose-Capillary: 135 mg/dL — ABNORMAL HIGH (ref 70–99)

## 2012-02-25 LAB — CULTURE, RESPIRATORY W GRAM STAIN
Culture: NORMAL
Gram Stain: NONE SEEN

## 2012-02-25 LAB — VANCOMYCIN, TROUGH: Vancomycin Tr: 20.3 ug/mL — ABNORMAL HIGH (ref 10.0–20.0)

## 2012-02-25 MED ORDER — POLYETHYLENE GLYCOL 3350 17 G PO PACK
17.0000 g | PACK | Freq: Every day | ORAL | Status: DC
  Administered 2012-02-25 – 2012-02-27 (×2): 17 g via ORAL
  Filled 2012-02-25 (×3): qty 1

## 2012-02-25 NOTE — Progress Notes (Signed)
[facility] PROGRESS NOTE  Keith Bell ZOX:[id number] DOB: 1951/03/10 DOA: 02/22/2012 PCP: Eino Farber, MD  Assessment/Plan: Active Problems:  Hodgkin's lymphoma in relapse  COPD (chronic obstructive pulmonary disease)  Peripheral vascular disease with claudication  PNA (pneumonia)  HTN (hypertension)  Hyperlipidemia  Acute on chronic renal failure  Anemia  CKD (chronic kidney disease), stage III  Metabolic acidosis  Necrotizing pneumonia   Healthcare associated necrotizing pneumonia -RUL infiltrates, patient was treated recently with 2 courses of antibiotics, this is third time. -CT scan showed no obstructive lesions, worsening right upper lower lobe pneumonia -Pulmonary was consulted, recommended continued current antibiotic. -Likely patient will need prolonged period of antibiotic, for necrotizing pneumonia. Likely Augmentin.  Metabolic acidosis -Can be likely to the infection or secondary to his uremia.  -Slowly improving, continue IV fluids, check BMP in the morning.  Acute renal failure on background of CKD stage III -Patient baseline creatinine about 1.0, he was presented with creatinine of 1.9. -Improving today, continue IV fluids, check BMP in the morning.  Hodgkin lymphoma -Stage IVB Hodgkin's lymphoma, follows with Dr. Arline Asp. -Patient is on Afinitor, this is been on hold likely old restarted of discharge.  Peripheral vascular disease with claudication -Stable, not on aspirin secondary to aspirin allergy.  Code Status: DNR/DNI Family Communication:  Disposition Plan: Remains inpatient  Brief narrative: 61 year old African American male with stomach cancer and chronic kidney disease came to the hospital with worsening of shortness of breath, acidosis and findings of right upper lobe opacity.  Consultants:  Pulmonary  Procedures:  None  Antibiotics:  Zosyn, vancomycin and levofloxacin  HPI/Subjective: Patient says she feels  much better  Objective: Filed Vitals:   02/25/12 0219 02/25/12 0500 02/25/12 0503 02/25/12 0944  BP:   109/71   Pulse:   90   Temp:   97.5 F (36.4 C)   TempSrc:   Oral   Resp:   18   Height:      Weight:  56.11 kg (123 lb 11.2 oz)    SpO2: 99%  98% 98%    Intake/Output Summary (Last 24 hours) at 02/25/12 0958 Last data filed at 02/25/12 0700  Gross per 24 hour  Intake 4272.5 ml  Output   2250 ml  Net 2022.5 ml    Exam:  General: Alert and awake, oriented x3, not in any acute distress. HEENT: anicteric sclera, pupils reactive to light and accommodation, EOMI CVS: S1-S2 clear, no murmur rubs or gallops Chest: clear to auscultation bilaterally, no wheezing, rales or rhonchi Abdomen: soft nontender, nondistended, normal bowel sounds, no organomegaly Extremities: no cyanosis, clubbing or edema noted bilaterally Neuro: Cranial nerves II-XII intact, no focal neurological deficits  Data Reviewed: Basic Metabolic Panel:  Lab 02/24/12 8119 02/23/12 1030 02/22/12 1630 02/22/12 1600  NA 140 138 -- 137  K 3.5 3.5 -- 3.1*  CL 110 108 -- 106  CO2 16* 15* -- 13*  GLUCOSE 224* 250* -- 104*  BUN 32* 33* -- 39*  CREATININE 1.67* 1.77* -- 1.91*  CALCIUM 9.3 9.2 -- 9.5  MG -- -- 2.1 --  PHOS -- -- -- --   Liver Function Tests:  Lab 02/22/12 1600  AST 18  ALT 12  ALKPHOS 236*  BILITOT 0.4  PROT 7.0  ALBUMIN 2.1*   No results found for this basename: LIPASE:5,AMYLASE:5 in the last 168 hours No results found for this basename: AMMONIA:5 in the last 168 hours CBC:  Lab 02/24/12 0448 02/23/12 1030 02/22/12 1600  WBC  9.4 8.1 13.1*  NEUTROABS -- -- 9.5*  HGB 8.5* 8.7* 7.0*  HCT 24.7* 25.2* 20.5*  MCV 82.6 82.6 78.5  PLT 127* 133* 166   Cardiac Enzymes:  Lab 02/22/12 1630  CKTOTAL --  CKMB --  CKMBINDEX --  TROPONINI <0.30   BNP (last 3 results)  Basename 02/22/12 1600  PROBNP 1118.0*   CBG:  Lab 02/25/12 0729 02/24/12 2134 02/24/12 1749 02/24/12 1209    GLUCAP 178* 208* 196* 200*    Recent Results (from the past 240 hour(s))  CULTURE, BLOOD (ROUTINE X 2)     Status: Normal (Preliminary result)   Collection Time   02/22/12 10:40 PM      Component Value Range Status Comment   Specimen Description BLOOD RIGHT ANTECUBITAL   Final    Special Requests BOTTLES DRAWN AEROBIC AND ANAEROBIC 2CC   Final    Culture  Setup Time 02/23/2012 05:04   Final    Culture     Final    Value:        BLOOD CULTURE RECEIVED NO GROWTH TO DATE CULTURE WILL BE HELD FOR 5 DAYS BEFORE ISSUING A FINAL NEGATIVE REPORT   Report Status PENDING   Incomplete   MRSA PCR SCREENING     Status: Normal   Collection Time   02/22/12 10:54 PM      Component Value Range Status Comment   MRSA by PCR NEGATIVE  NEGATIVE Final   CULTURE, BLOOD (ROUTINE X 2)     Status: Normal (Preliminary result)   Collection Time   02/22/12 11:00 PM      Component Value Range Status Comment   Specimen Description BLOOD RIGHT ARM   Final    Special Requests BOTTLES DRAWN AEROBIC AND ANAEROBIC 5CC   Final    Culture  Setup Time 02/23/2012 05:04   Final    Culture     Final    Value:        BLOOD CULTURE RECEIVED NO GROWTH TO DATE CULTURE WILL BE HELD FOR 5 DAYS BEFORE ISSUING A FINAL NEGATIVE REPORT   Report Status PENDING   Incomplete   CULTURE, EXPECTORATED SPUTUM-ASSESSMENT     Status: Normal   Collection Time   02/23/12 10:46 AM      Component Value Range Status Comment   Specimen Description SPUTUM   Final    Special Requests Immunocompromised   Final    Sputum evaluation     Final    Value: THIS SPECIMEN IS ACCEPTABLE. RESPIRATORY CULTURE REPORT TO FOLLOW.   Report Status 02/23/2012 FINAL   Final   CULTURE, RESPIRATORY     Status: Normal (Preliminary result)   Collection Time   02/23/12 10:46 AM      Component Value Range Status Comment   Specimen Description SPUTUM   Final    Special Requests NONE   Final    Gram Stain     Final    Value: NO WBC SEEN     FEW SQUAMOUS EPITHELIAL  CELLS PRESENT     FEW GRAM POSITIVE COCCI IN PAIRS     FEW GRAM POSITIVE RODS   Culture NORMAL OROPHARYNGEAL FLORA   Final    Report Status PENDING   Incomplete      Studies: Dg Chest 2 View  02/22/2012  *RADIOLOGY REPORT*  Clinical Data: Short of breath.  Weakness and dizziness. Productive cough.  CHEST - 2 VIEW  Comparison: 02/07/2012 and multiple previous  Findings: Power port remains in  place with its tip in the SVC 4 cm above the right atrium.  There is been further worsening of consolidation and volume loss in the right upper lobe.  The remainder of the lungs are clear.  No effusions.  No bony abnormalities.  Impression:  Further worsening of consolidation and volume loss in the right upper lobe.  Original Report Authenticated By: Thomasenia Sales, M.D.   Nm Pulmonary Per & Vent  02/22/2012  *RADIOLOGY REPORT*  Clinical Data: Elevated D-dimer.  Abnormal chest radiograph.  NM PULMONARY VENTILATION AND PERFUSION SCAN  Radiopharmaceutical: CURIE MAA TECHNETIUM TO 104M ALBUMIN AGGREGATED  Comparison: Chest radiography same day  Findings: Perfusion of the left lung is normal.  There is diminished perfusion of the right upper lobe, matching the dense pulmonary infiltrate.  No defects seen in the right lower lobe.  IMPRESSION: Low probability of pulmonary embolism.  No defects in the lung that is normally aerated at radiography.  Matched defect in the right upper lobe to the region of dense pulmonary infiltrate.  Original Report Authenticated By: Thomasenia Sales, M.D.   Dg Chest Port 1 View  02/07/2012  *RADIOLOGY REPORT*  Clinical Data: Cough, fever, history of gastric cancer  PORTABLE CHEST - 1 VIEW  Comparison: 01/17/2012; 04/29/2011; PET CT - 12/21/2011  Findings: Unchanged cardiac silhouette and mediastinal contours. Stable positioning of support apparatus.  Increased right upper lung heterogeneous air space opacities worrisome for infection. The left hemithorax is unchanged.  No definite  pleural effusion.  A skin fold overlies the peripheral aspect of the left upper lung. No definite pneumothorax.  Unchanged bones.  IMPRESSION: Worsening right upper lung pneumonia.  A follow-up chest radiograph in 4 to 6 weeks after treatment is recommended to ensure resolution.  Original Report Authenticated By: Waynard Reeds, M.D.    Scheduled Meds:    . guaiFENesin  1,200 mg Oral BID  . insulin aspart  0-9 Units Subcutaneous TID WC  . ipratropium  0.5 mg Nebulization Q6H  . lactose free nutrition  237 mL Oral TID WC  . levalbuterol  0.63 mg Nebulization Q6H  . levofloxacin (LEVAQUIN) IV  750 mg Intravenous Q48H  . multivitamin with minerals  1 tablet Oral Daily  . pantoprazole  40 mg Oral Daily  . piperacillin-tazobactam (ZOSYN)  IV  3.375 g Intravenous Q8H  . potassium chloride SA  20 mEq Oral Daily  . predniSONE  20 mg Oral QODAY  . vancomycin  500 mg Intravenous Q12H  . DISCONTD: ceFEPime (MAXIPIME) IV  1 g Intravenous Q24H  . DISCONTD: methylPREDNISolone (SOLU-MEDROL) injection  40 mg Intravenous Q12H   Continuous Infusions:    . sodium chloride 75 mL/hr at 02/25/12 0635     [facility] Pager [phone]  If 7PM-7AM, please contact night-coverage [email or web address] Password [email or web address] 02/25/2012, 9:58 AM   LOS: 3 days

## 2012-02-26 ENCOUNTER — Inpatient Hospital Stay (HOSPITAL_COMMUNITY): Payer: Medicare Other

## 2012-02-26 LAB — CBC
Platelets: 135 10*3/uL — ABNORMAL LOW (ref 150–400)
RDW: 20.6 % — ABNORMAL HIGH (ref 11.5–15.5)
WBC: 7.3 10*3/uL (ref 4.0–10.5)

## 2012-02-26 LAB — BASIC METABOLIC PANEL
Calcium: 8.8 mg/dL (ref 8.4–10.5)
Creatinine, Ser: 1.55 mg/dL — ABNORMAL HIGH (ref 0.50–1.35)
GFR calc Af Amer: 54 mL/min — ABNORMAL LOW (ref >90)

## 2012-02-26 LAB — GLUCOSE, CAPILLARY
Glucose-Capillary: 97 mg/dL (ref 70–99)
Glucose-Capillary: 89 mg/dL (ref 70–99)

## 2012-02-26 LAB — LEGIONELLA ANTIGEN, URINE: Legionella Antigen, Urine: NEGATIVE

## 2012-02-26 MED ORDER — POTASSIUM CHLORIDE CRYS ER 20 MEQ PO TBCR
40.0000 meq | EXTENDED_RELEASE_TABLET | Freq: Every day | ORAL | Status: DC
  Filled 2012-02-26: qty 2

## 2012-02-26 MED ORDER — ONDANSETRON HCL 4 MG/2ML IJ SOLN
4.0000 mg | Freq: Four times a day (QID) | INTRAMUSCULAR | Status: DC | PRN
  Administered 2012-02-27: 4 mg via INTRAVENOUS
  Filled 2012-02-26: qty 2

## 2012-02-26 MED ORDER — SODIUM CHLORIDE 0.9 % IJ SOLN
10.0000 mL | INTRAMUSCULAR | Status: DC | PRN
  Administered 2012-02-26 – 2012-02-27 (×3): 10 mL

## 2012-02-26 MED ORDER — POTASSIUM CHLORIDE CRYS ER 20 MEQ PO TBCR
60.0000 meq | EXTENDED_RELEASE_TABLET | Freq: Once | ORAL | Status: AC
  Administered 2012-02-26: 60 meq via ORAL
  Filled 2012-02-26: qty 3

## 2012-02-26 MED ORDER — MAGNESIUM HYDROXIDE 400 MG/5ML PO SUSP: 30.0000 mL | Freq: Once | ORAL | Status: DC

## 2012-02-26 NOTE — Progress Notes (Signed)
ANTIBIOTIC CONSULT NOTE - FOLLOW UP  Pharmacy Consult for vancomycin Indication: pneumonia  Allergies  Allergen Reactions  . Aspirin Nausea Only    Patient Measurements: Height: 5\' 6"  (167.6 cm) Weight: 125 lb 4.8 oz (56.836 kg) IBW/kg (Calculated) : 63.8  Adjusted Body Weight:   Vital Signs: Temp: 97.7 F (36.5 C) (07/14 0429) Temp src: Oral (07/14 0429) BP: 104/68 mmHg (07/14 0429) Pulse Rate: 104  (07/14 0429) Intake/Output from previous day: 07/13 0701 - 07/14 0700 In: 2592.5 [P.O.:1200; I.V.:1092.5; IV Piggyback:300] Out: 2254 [Urine:2250; Stool:4] Intake/Output from this shift: Total I/O In: 1120 [P.O.:720; I.V.:300; IV Piggyback:100] Out: 1701 [Urine:1700; Stool:1]  Labs:  [facility] 02/26/12 0540 02/24/12 0448 02/23/12 1030  WBC 7.3 9.4 8.1  HGB 8.6* 8.5* 8.7*  PLT 135* 127* 133*  LABCREA -- -- --  CREATININE -- 1.67* 1.77*   Estimated Creatinine Clearance: 37.8 ml/min (by C-G formula based on Cr of 1.67).  Basename 02/25/12 2117  VANCOTROUGH 20.3*  VANCOPEAK --  Drue Dun --  GENTTROUGH --  GENTPEAK --  GENTRANDOM --  TOBRATROUGH --  TOBRAPEAK --  TOBRARND --  AMIKACINPEAK --  AMIKACINTROU --  AMIKACIN --     Microbiology: Recent Results (from the past 720 hour(s))  TECHNOLOGIST REVIEW     Status: Normal   Collection Time   02/01/12  7:45 AM      Component Value Range Status Comment   Technologist Review Metas and Myelocytes present   Final   URINE CULTURE     Status: Normal   Collection Time   02/07/12  3:18 PM      Component Value Range Status Comment   Specimen Description URINE, CLEAN CATCH   Final    Special Requests NONE   Final    Culture  Setup Time [id number]   Final    Colony Count [id number]   Final    Culture     Final    Value: Multiple bacterial morphotypes present, none predominant. Suggest appropriate recollection if clinically indicated.   Report Status 02/08/2012 FINAL   Final   CULTURE, BLOOD (ROUTINE X 2)      Status: Normal   Collection Time   02/07/12  3:37 PM      Component Value Range Status Comment   Specimen Description BLOOD LEFT ANTECUBITAL   Final    Special Requests BOTTLES DRAWN AEROBIC AND ANAEROBIC 5 CC EACH   Final    Culture  Setup Time 02/08/2012 01:32   Final    Culture NO GROWTH 5 DAYS   Final    Report Status 02/14/2012 FINAL   Final   CULTURE, BLOOD (ROUTINE X 2)     Status: Normal   Collection Time   02/07/12  4:19 PM      Component Value Range Status Comment   Specimen Description BLOOD PORTA CATH   Final    Special Requests BOTTLES DRAWN AEROBIC AND ANAEROBIC [facility] EACH   Final    Culture  Setup Time 02/08/2012 01:32   Final    Culture NO GROWTH 5 DAYS   Final    Report Status 02/14/2012 FINAL   Final   MRSA PCR SCREENING     Status: Normal   Collection Time   02/08/12 12:52 AM      Component Value Range Status Comment   MRSA by PCR NEGATIVE  NEGATIVE Final   CULTURE, BLOOD (ROUTINE X 2)     Status: Normal (Preliminary result)   Collection Time   02/22/12  10:40 PM      Component Value Range Status Comment   Specimen Description BLOOD RIGHT ANTECUBITAL   Final    Special Requests BOTTLES DRAWN AEROBIC AND ANAEROBIC 2CC   Final    Culture  Setup Time 02/23/2012 05:04   Final    Culture     Final    Value:        BLOOD CULTURE RECEIVED NO GROWTH TO DATE CULTURE WILL BE HELD FOR 5 DAYS BEFORE ISSUING A FINAL NEGATIVE REPORT   Report Status PENDING   Incomplete   MRSA PCR SCREENING     Status: Normal   Collection Time   02/22/12 10:54 PM      Component Value Range Status Comment   MRSA by PCR NEGATIVE  NEGATIVE Final   CULTURE, BLOOD (ROUTINE X 2)     Status: Normal (Preliminary result)   Collection Time   02/22/12 11:00 PM      Component Value Range Status Comment   Specimen Description BLOOD RIGHT ARM   Final    Special Requests BOTTLES DRAWN AEROBIC AND ANAEROBIC 5CC   Final    Culture  Setup Time 02/23/2012 05:04   Final    Culture     Final    Value:         BLOOD CULTURE RECEIVED NO GROWTH TO DATE CULTURE WILL BE HELD FOR 5 DAYS BEFORE ISSUING A FINAL NEGATIVE REPORT   Report Status PENDING   Incomplete   CULTURE, EXPECTORATED SPUTUM-ASSESSMENT     Status: Normal   Collection Time   02/23/12 10:46 AM      Component Value Range Status Comment   Specimen Description SPUTUM   Final    Special Requests Immunocompromised   Final    Sputum evaluation     Final    Value: THIS SPECIMEN IS ACCEPTABLE. RESPIRATORY CULTURE REPORT TO FOLLOW.   Report Status 02/23/2012 FINAL   Final   CULTURE, RESPIRATORY     Status: Normal   Collection Time   02/23/12 10:46 AM      Component Value Range Status Comment   Specimen Description SPUTUM   Final    Special Requests NONE   Final    Gram Stain     Final    Value: NO WBC SEEN     FEW SQUAMOUS EPITHELIAL CELLS PRESENT     FEW GRAM POSITIVE COCCI IN PAIRS     FEW GRAM POSITIVE RODS   Culture NORMAL OROPHARYNGEAL FLORA   Final    Report Status 02/25/2012 FINAL   Final     Anti-infectives     Start     Dose/Rate Route Frequency Ordered Stop   02/24/12 1600  piperacillin-tazobactam (ZOSYN) IVPB 3.375 g       3.375 g 12.5 mL/hr over 240 Minutes Intravenous Every 8 hours 02/24/12 1439     02/24/12 1000   vancomycin (VANCOCIN) 500 mg in sodium chloride 0.9 % 100 mL IVPB        500 mg 100 mL/hr over 60 Minutes Intravenous Every 12 hours 02/24/12 0904 03/01/12 2359   02/23/12 1800   ceFEPIme (MAXIPIME) 1 g in dextrose 5 % 50 mL IVPB  Status:  Discontinued        1 g 100 mL/hr over 30 Minutes Intravenous Every 24 hours 02/22/12 2232 02/24/12 1425   02/22/12 2300   levofloxacin (LEVAQUIN) IVPB 750 mg  Status:  Discontinued        750 mg 100  mL/hr over 90 Minutes Intravenous Every 24 hours 02/22/12 2210 02/22/12 2230   02/22/12 2300   levofloxacin (LEVAQUIN) IVPB 750 mg        750 mg 100 mL/hr over 90 Minutes Intravenous Every 48 hours 02/22/12 2230 02/25/12 0137   02/22/12 2300   vancomycin (VANCOCIN) 500  mg in sodium chloride 0.9 % 100 mL IVPB  Status:  Discontinued        500 mg 100 mL/hr over 60 Minutes Intravenous Every 24 hours 02/22/12 2232 02/24/12 0904   02/22/12 2215   ceFEPIme (MAXIPIME) 1 g in dextrose 5 % 50 mL IVPB  Status:  Discontinued        1 g 100 mL/hr over 30 Minutes Intravenous 3 times per day 02/22/12 2210 02/22/12 2214   02/22/12 1800   ceFEPIme (MAXIPIME) 1 g in dextrose 5 % 50 mL IVPB  Status:  Discontinued        1 g 100 mL/hr over 30 Minutes Intravenous Every 8 hours 02/22/12 1714 02/22/12 2231          Assessment: Patient with level just above goal.  Goal of Therapy:  Vancomycin trough level 15-20 mcg/ml  Plan:  Follow up culture results Will keep same dose, feel better to be a little high vs. be to low from dose adjustments.  Darlina Guys, Jacquenette Shone Crowford 02/26/2012,6:09 AM

## 2012-02-26 NOTE — Progress Notes (Signed)
[facility] PROGRESS NOTE  Keith Bell ZOX:[id number] DOB: 1951-02-28 DOA: 02/22/2012 PCP: Eino Farber, MD  Assessment/Plan: Active Problems:  Hodgkin's lymphoma in relapse  COPD (chronic obstructive pulmonary disease)  Peripheral vascular disease with claudication  PNA (pneumonia)  HTN (hypertension)  Hyperlipidemia  Acute on chronic renal failure  Anemia  CKD (chronic kidney disease), stage III  Metabolic acidosis  Necrotizing pneumonia   Healthcare associated necrotizing pneumonia -RUL infiltrates, patient was treated recently with 2 courses of antibiotics, this is third time. -CT scan showed no obstructive lesions, worsening right upper lower lobe pneumonia -Pulmonary was consulted, recommended continued current antibiotic.  -Patient will need prolonged period of antibiotic, for necrotizing pneumonia. Likely Augmentin.  Metabolic acidosis -Can be likely to the infection or secondary to his uremia.  -Slowly improving, continue IV fluids, check BMP in the morning.  Acute renal failure on background of CKD stage III -Patient baseline creatinine about 1.0, he was presented with creatinine of 1.9. -Improving slowly, continue IV fluids, check BMP in the morning.  Hodgkin lymphoma -Stage IVB Hodgkin's lymphoma, follows with Dr. Arline Asp. -Patient is on Afinitor, this is been on hold likely old restarted of discharge.  Peripheral vascular disease with claudication -Stable, not on aspirin secondary to aspirin allergy.  Nausea and vomiting -This is likely secondary to narcotics medications, abdominal x-ray suggests only constipation.  Code Status: DNR/DNI Family Communication:  Disposition Plan: Remains inpatient  Brief narrative: 61 year old African American male with stomach cancer and chronic kidney disease came to the hospital with worsening of shortness of breath, acidosis and findings of right upper lobe  opacity.  Consultants:  Pulmonary  Procedures:  None  Antibiotics:  Zosyn, vancomycin and levofloxacin  HPI/Subjective: Patient says she feels much better  Objective: Filed Vitals:   02/25/12 2027 02/25/12 2117 02/26/12 0429 02/26/12 0813  BP:  100/71 104/68   Pulse:  98 104   Temp:  97.6 F (36.4 C) 97.7 F (36.5 C)   TempSrc:  Oral Oral   Resp:  18 18   Height:      Weight:   56.836 kg (125 lb 4.8 oz)   SpO2: 98% 99% 97% 98%    Intake/Output Summary (Last 24 hours) at 02/26/12 0956 Last data filed at 02/26/12 0700  Gross per 24 hour  Intake 3312.5 ml  Output   3054 ml  Net  258.5 ml    Exam:  General: Alert and awake, oriented x3, not in any acute distress. HEENT: anicteric sclera, pupils reactive to light and accommodation, EOMI CVS: S1-S2 clear, no murmur rubs or gallops Chest: clear to auscultation bilaterally, no wheezing, rales or rhonchi Abdomen: soft nontender, nondistended, normal bowel sounds, no organomegaly Extremities: no cyanosis, clubbing or edema noted bilaterally Neuro: Cranial nerves II-XII intact, no focal neurological deficits  Data Reviewed: Basic Metabolic Panel:  Lab 02/26/12 8119 02/24/12 0448 02/23/12 1030 02/22/12 1630 02/22/12 1600  NA 142 140 138 -- 137  K 3.3* 3.5 3.5 -- 3.1*  CL 116* 110 108 -- 106  CO2 16* 16* 15* -- 13*  GLUCOSE 108* 224* 250* -- 104*  BUN 30* 32* 33* -- 39*  CREATININE 1.55* 1.67* 1.77* -- 1.91*  CALCIUM 8.8 9.3 9.2 -- 9.5  MG -- -- -- 2.1 --  PHOS -- -- -- -- --   Liver Function Tests:  Lab 02/22/12 1600  AST 18  ALT 12  ALKPHOS 236*  BILITOT 0.4  PROT 7.0  ALBUMIN 2.1*   No results found  for this basename: LIPASE:5,AMYLASE:5 in the last 168 hours No results found for this basename: AMMONIA:5 in the last 168 hours CBC:  Lab 02/26/12 0540 02/24/12 0448 02/23/12 1030 02/22/12 1600  WBC 7.3 9.4 8.1 13.1*  NEUTROABS -- -- -- 9.5*  HGB 8.6* 8.5* 8.7* 7.0*  HCT 26.0* 24.7* 25.2* 20.5*   MCV 84.7 82.6 82.6 78.5  PLT 135* 127* 133* 166   Cardiac Enzymes:  Lab 02/22/12 1630  CKTOTAL --  CKMB --  CKMBINDEX --  TROPONINI <0.30   BNP (last 3 results)  Basename 02/22/12 1600  PROBNP 1118.0*   CBG:  Lab 02/26/12 0726 02/25/12 2313 02/25/12 1627 02/25/12 1136 02/25/12 0729  GLUCAP 97 106* 135* 152* 178*    Recent Results (from the past 240 hour(s))  CULTURE, BLOOD (ROUTINE X 2)     Status: Normal (Preliminary result)   Collection Time   02/22/12 10:40 PM      Component Value Range Status Comment   Specimen Description BLOOD RIGHT ANTECUBITAL   Final    Special Requests BOTTLES DRAWN AEROBIC AND ANAEROBIC 2CC   Final    Culture  Setup Time 02/23/2012 05:04   Final    Culture     Final    Value:        BLOOD CULTURE RECEIVED NO GROWTH TO DATE CULTURE WILL BE HELD FOR 5 DAYS BEFORE ISSUING A FINAL NEGATIVE REPORT   Report Status PENDING   Incomplete   MRSA PCR SCREENING     Status: Normal   Collection Time   02/22/12 10:54 PM      Component Value Range Status Comment   MRSA by PCR NEGATIVE  NEGATIVE Final   CULTURE, BLOOD (ROUTINE X 2)     Status: Normal (Preliminary result)   Collection Time   02/22/12 11:00 PM      Component Value Range Status Comment   Specimen Description BLOOD RIGHT ARM   Final    Special Requests BOTTLES DRAWN AEROBIC AND ANAEROBIC 5CC   Final    Culture  Setup Time 02/23/2012 05:04   Final    Culture     Final    Value:        BLOOD CULTURE RECEIVED NO GROWTH TO DATE CULTURE WILL BE HELD FOR 5 DAYS BEFORE ISSUING A FINAL NEGATIVE REPORT   Report Status PENDING   Incomplete   CULTURE, EXPECTORATED SPUTUM-ASSESSMENT     Status: Normal   Collection Time   02/23/12 10:46 AM      Component Value Range Status Comment   Specimen Description SPUTUM   Final    Special Requests Immunocompromised   Final    Sputum evaluation     Final    Value: THIS SPECIMEN IS ACCEPTABLE. RESPIRATORY CULTURE REPORT TO FOLLOW.   Report Status 02/23/2012 FINAL    Final   CULTURE, RESPIRATORY     Status: Normal   Collection Time   02/23/12 10:46 AM      Component Value Range Status Comment   Specimen Description SPUTUM   Final    Special Requests NONE   Final    Gram Stain     Final    Value: NO WBC SEEN     FEW SQUAMOUS EPITHELIAL CELLS PRESENT     FEW GRAM POSITIVE COCCI IN PAIRS     FEW GRAM POSITIVE RODS   Culture NORMAL OROPHARYNGEAL FLORA   Final    Report Status 02/25/2012 FINAL   Final  Studies: Dg Chest 2 View  02/22/2012  *RADIOLOGY REPORT*  Clinical Data: Short of breath.  Weakness and dizziness. Productive cough.  CHEST - 2 VIEW  Comparison: 02/07/2012 and multiple previous  Findings: Power port remains in place with its tip in the SVC 4 cm above the right atrium.  There is been further worsening of consolidation and volume loss in the right upper lobe.  The remainder of the lungs are clear.  No effusions.  No bony abnormalities.  Impression:  Further worsening of consolidation and volume loss in the right upper lobe.  Original Report Authenticated By: Thomasenia Sales, M.D.   Nm Pulmonary Per & Vent  02/22/2012  *RADIOLOGY REPORT*  Clinical Data: Elevated D-dimer.  Abnormal chest radiograph.  NM PULMONARY VENTILATION AND PERFUSION SCAN  Radiopharmaceutical: CURIE MAA TECHNETIUM TO 59M ALBUMIN AGGREGATED  Comparison: Chest radiography same day  Findings: Perfusion of the left lung is normal.  There is diminished perfusion of the right upper lobe, matching the dense pulmonary infiltrate.  No defects seen in the right lower lobe.  IMPRESSION: Low probability of pulmonary embolism.  No defects in the lung that is normally aerated at radiography.  Matched defect in the right upper lobe to the region of dense pulmonary infiltrate.  Original Report Authenticated By: Thomasenia Sales, M.D.   Dg Chest Port 1 View  02/07/2012  *RADIOLOGY REPORT*  Clinical Data: Cough, fever, history of gastric cancer  PORTABLE CHEST - 1 VIEW  Comparison:  01/17/2012; 04/29/2011; PET CT - 12/21/2011  Findings: Unchanged cardiac silhouette and mediastinal contours. Stable positioning of support apparatus.  Increased right upper lung heterogeneous air space opacities worrisome for infection. The left hemithorax is unchanged.  No definite pleural effusion.  A skin fold overlies the peripheral aspect of the left upper lung. No definite pneumothorax.  Unchanged bones.  IMPRESSION: Worsening right upper lung pneumonia.  A follow-up chest radiograph in 4 to 6 weeks after treatment is recommended to ensure resolution.  Original Report Authenticated By: Waynard Reeds, M.D.    Scheduled Meds:    . guaiFENesin  1,200 mg Oral BID  . insulin aspart  0-9 Units Subcutaneous TID WC  . ipratropium  0.5 mg Nebulization Q6H  . lactose free nutrition  237 mL Oral TID WC  . levalbuterol  0.63 mg Nebulization Q6H  . multivitamin with minerals  1 tablet Oral Daily  . pantoprazole  40 mg Oral Daily  . piperacillin-tazobactam (ZOSYN)  IV  3.375 g Intravenous Q8H  . polyethylene glycol  17 g Oral Daily  . potassium chloride SA  20 mEq Oral Daily  . predniSONE  20 mg Oral QODAY  . vancomycin  500 mg Intravenous Q12H   Continuous Infusions:    . sodium chloride 75 mL/hr at 02/25/12 0635     [facility] Pager [phone]  If 7PM-7AM, please contact night-coverage [email or web address] Password [email or web address] 02/26/2012, 9:56 AM   LOS: 4 days

## 2012-02-27 ENCOUNTER — Inpatient Hospital Stay (HOSPITAL_COMMUNITY): Payer: Medicare Other

## 2012-02-27 DIAGNOSIS — C8589 Other specified types of non-Hodgkin lymphoma, extranodal and solid organ sites: Secondary | ICD-10-CM

## 2012-02-27 DIAGNOSIS — I10 Essential (primary) hypertension: Secondary | ICD-10-CM

## 2012-02-27 LAB — CBC
MCHC: 33.5 g/dL (ref 30.0–36.0)
MCV: 84.3 fL (ref 78.0–100.0)
Platelets: 127 10*3/uL — ABNORMAL LOW (ref 150–400)
RDW: 21 % — ABNORMAL HIGH (ref 11.5–15.5)
WBC: 7.4 10*3/uL (ref 4.0–10.5)

## 2012-02-27 LAB — GLUCOSE, CAPILLARY
Glucose-Capillary: 113 mg/dL — ABNORMAL HIGH (ref 70–99)
Glucose-Capillary: 121 mg/dL — ABNORMAL HIGH (ref 70–99)

## 2012-02-27 LAB — BASIC METABOLIC PANEL
CO2: 16 mEq/L — ABNORMAL LOW (ref 19–32)
Calcium: 8.8 mg/dL (ref 8.4–10.5)
Creatinine, Ser: 1.48 mg/dL — ABNORMAL HIGH (ref 0.50–1.35)
GFR calc Af Amer: 58 mL/min — ABNORMAL LOW (ref >90)

## 2012-02-27 MED ORDER — AMOXICILLIN-POT CLAVULANATE 875-125 MG PO TABS: 1.0000 | ORAL_TABLET | Freq: Two times a day (BID) | ORAL | Status: AC

## 2012-02-27 MED ORDER — AMOXICILLIN-POT CLAVULANATE 875-125 MG PO TABS: 1.0000 | ORAL_TABLET | Freq: Two times a day (BID) | ORAL | Status: DC

## 2012-02-27 MED ORDER — HEPARIN SOD (PORK) LOCK FLUSH 100 UNIT/ML IV SOLN
500.0000 [IU] | INTRAVENOUS | Status: AC | PRN
  Administered 2012-02-27: 500 [IU]

## 2012-02-27 NOTE — Discharge Summary (Signed)
Physician Discharge Summary  Keith Bell NWG:[id number] DOB: 1950-11-25 DOA: 02/22/2012  PCP: Eino Farber, MD  Admit date: 02/22/2012 Discharge date: 02/27/2012  Recommendations for Outpatient Follow-up:  1. Chest x-ray, prescription for Augmentin given for 4 weeks, evaluate if patient needs 2 more weeks.  Discharge Diagnoses:  Principal Problem:  *Necrotizing pneumonia Active Problems:  Hodgkin's lymphoma in relapse  COPD (chronic obstructive pulmonary disease)  Peripheral vascular disease with claudication  PNA (pneumonia)  HTN (hypertension)  Hyperlipidemia  Acute on chronic renal failure  Anemia  CKD (chronic kidney disease), stage III  Metabolic acidosis   1. Necrotizing pneumonia  Discharge Condition: Stable  Diet recommendation: Regular  History of present illness:  Patient is a 61 year old African American male with past medical history of stage IV Hodgkin's lymphoma as well as COPD, chronic kidney disease and peripheral vascular disease who was just discharged from the hospital approximately 2 weeks ago for COPD and pneumonia. He initially had been feeling well, but for the last few days has had progressively worsening shortness of breath along with cough and generalized weakness and came into the emergency room.  In the emergency room, he was noted by x-ray to have worsening consolidation as seen on his previous x-ray from last admission. His creatinine was slightly elevated at 1.9 and on discharge was 1.6. His white blood cell count had increased to 13.1 and his hemoglobin was down to 7.0 (was 8.7 on discharge.) Most concerning was an anion gap at 18. Patient underwent Hemoccult which was negative. He was put on oxygen and breathing treatments and started to feel a little bit better. Hospitalists were called for further evaluation and admission.   Hospital Course:   1. Necrotizing pneumonia: Patient was presented with RUL infiltrates, he was treated  recently with 2 courses of antibiotics. This is his third time to be admitted to the hospital was pneumonia in the same lobe. CT scan of chest was done without contrast showed no obstructive lesions, there is worsening of the right upper lobe pneumonia was necrosis. Pulmonary was consulted, and they recommended to treat initially with broad-spectrum antibiotic, then with prolonged period of Augmentin. Patient needs followup as outpatient for recheck of his chest x-ray.  2. Metabolic acidosis: Patient was presented with metabolic acidosis with bicarbonate of 11, his gap is 15. This is thought to be secondary to acute on chronic renal failure. Also his infection is contributing to his metabolic acidosis. This is improved at the time of discharge but still has bicarbonate 16. Patient did not have any shortness of breath or other symptoms.  3. Acute renal failure and background of CKD stage III: His creatinine baseline was 1.0 on May 2013, he came in to the hospital with creatinine of 1.9, with IV fluids hydration his creatinine has improved to 1.4 discharge needs followup as outpatient.  4. Hodgkin's lymphoma: Patient has stage IV B. Hodgkin's lymphoma, he follows with Dr. Arline Asp, patient is on Afinitor, this was on hold while in the hospital and restarted the time of discharge.  5. Peripheral vascular disease with claudication: Stable, patient is on aspirin secondary to aspirin allergy.  6. Questionable diabetes mellitus type 2: Patient did have recent hemoglobin A1c of 6.9, according to American diabetic Association guidelines this is consistent with type 2 diabetes mellitus. Patient was on high dose of steroids for some time now, and is being tapered down to 20 mg every other day of prednisone. I did not start him on oral hypoglycemic. I  asked him to do diabetic diet, followup with his primary care physician to recheck hemoglobin A1c as outpatient. He also will need TSH to be  checked.  Procedures:  None  Consultations:  [facility]  Discharge Exam: Filed Vitals:   02/27/12 0700  BP: 117/76  Pulse: 72  Temp: 97.6 F (36.4 C)  Resp: 18   Filed Vitals:   02/26/12 2138 02/27/12 0502 02/27/12 0700 02/27/12 0933  BP: 99/65 105/70 117/76   Pulse: 102 100 72   Temp: 98.2 F (36.8 C) 98.1 F (36.7 C) 97.6 F (36.4 C)   TempSrc: Oral Oral Oral   Resp: 18 18 18    Height:      Weight:  57.425 kg (126 lb 9.6 oz)    SpO2: 100% 100% 100% 99%   General: Alert and awake, oriented x3, not in any acute distress. HEENT: anicteric sclera, pupils reactive to light and accommodation, EOMI CVS: S1-S2 clear, no murmur rubs or gallops Chest: clear to auscultation bilaterally, no wheezing, rales or rhonchi Abdomen: soft nontender, nondistended, normal bowel sounds, no organomegaly Extremities: no cyanosis, clubbing or edema noted bilaterally Neuro: Cranial nerves II-XII intact, no focal neurological deficits   Discharge Instructions  Discharge Orders    Future Appointments: Provider: Department: Dept Phone: Center:   03/13/2012 10:30 AM Julio Sicks, NP [facility] [phone] None     Future Orders Please Complete By Expires   Increase activity slowly        Medication List  As of 02/27/2012 11:09 AM   STOP taking these medications         Guaifenesin 1200 MG Tb12      levofloxacin 500 MG tablet      prochlorperazine 10 MG tablet         TAKE these medications         amoxicillin-clavulanate 875-125 MG per tablet   Commonly known as: AUGMENTIN   Take 1 tablet by mouth 2 (two) times daily.      everolimus 10 MG tablet   Commonly known as: AFINITOR   Take 10 mg by mouth every other day. Take 1 po every  Other day      HYDROcodone-acetaminophen 5-500 MG per tablet   Commonly known as: VICODIN   Take 1 tablet by mouth every 6 (six) hours as needed.      multivitamin with minerals Tabs   Take 1 tablet by mouth daily.       ondansetron 8 MG tablet   Commonly known as: ZOFRAN   Take 8 mg by mouth every 8 (eight) hours as needed. For nausea      pantoprazole 40 MG tablet   Commonly known as: PROTONIX   Take 1 tablet (40 mg total) by mouth daily.      potassium chloride SA 20 MEQ tablet   Commonly known as: K-DUR,KLOR-CON   Take 20 mEq by mouth daily.      predniSONE 20 MG tablet   Commonly known as: DELTASONE   Take 20 mg by mouth every other day.           Follow-up Information    Follow up with PARRETT,TAMMY, NP on 03/13/2012. (@ 10:15 AM)    Contact information:   [facility] [address] [place] [address] [phone]           The results of significant diagnostics from this hospitalization (including imaging, microbiology, ancillary and laboratory) are listed below for reference.  Significant Diagnostic Studies: Dg Chest 2 View  02/27/2012  *RADIOLOGY REPORT*  Clinical Data: Cough, congestion.  CHEST - 2 VIEW  Comparison: 02/22/2012  Findings: Right Port-A-Cath remains in place, unchanged.  Dense consolidation in the right upper lobe is stable.  Left lung is clear.  Heart is normal size.  No effusions.  IMPRESSION: Stable dense right upper lobe consolidation.  Original Report Authenticated By: Cyndie Chime, M.D.   Dg Chest 2 View  02/22/2012  *RADIOLOGY REPORT*  Clinical Data: Short of breath.  Weakness and dizziness. Productive cough.  CHEST - 2 VIEW  Comparison: 02/07/2012 and multiple previous  Findings: Power port remains in place with its tip in the SVC 4 cm above the right atrium.  There is been further worsening of consolidation and volume loss in the right upper lobe.  The remainder of the lungs are clear.  No effusions.  No bony abnormalities.  Impression:  Further worsening of consolidation and volume loss in the right upper lobe.  Original Report Authenticated By: Thomasenia Sales, M.D.   Ct Chest Wo Contrast  02/23/2012  *RADIOLOGY REPORT*   Clinical Data: Right lower lobe infiltrate.  Non Hodgkin lymphoma. Shortness of breath.  CT CHEST WITHOUT CONTRAST  Technique:  Multidetector CT imaging of the chest was performed following the standard protocol without IV contrast.  Comparison: PET 12/21/2011. CT chest 12/18/2008.  Findings: No pathologically enlarged mediastinal or axillary lymph nodes.  The hilar regions are difficult to definitively evaluate without IV contrast.  Coronary artery calcification.  Heart size normal.  No pericardial effusion.  Emphysema.  Airspace consolidation with internal rounded lucencies involving the majority of the anterior and posterior segment of the right upper lobe.  Associated air bronchograms.  Additional consolidation is seen in the medial aspect of the right lower lobe, with at least one air-fluid level (image 30), possibly with a previous site of bullous disease, as seen on 12/18/2008. Additional scattered peribronchovascular nodularity and mild consolidation in the right lower lobe, inferiorly.  7 mm subpleural left upper lobe nodule (image 36) is unchanged from 12/18/2008, indicating a benign etiology.  No pleural fluid.  Airway is unremarkable.  Incidental imaging of the upper abdomen shows no acute findings. No worrisome lytic or sclerotic lesions.  IMPRESSION:  Right upper and right lower lobe pneumonia. Associated fluid in multiple bullous lesions in the right upper and right lower lobes.  Original Report Authenticated By: Reyes Ivan, M.D.   Nm Pulmonary Per & Vent  02/22/2012  *RADIOLOGY REPORT*  Clinical Data: Elevated D-dimer.  Abnormal chest radiograph.  NM PULMONARY VENTILATION AND PERFUSION SCAN  Radiopharmaceutical: CURIE MAA TECHNETIUM TO 56M ALBUMIN AGGREGATED  Comparison: Chest radiography same day  Findings: Perfusion of the left lung is normal.  There is diminished perfusion of the right upper lobe, matching the dense pulmonary infiltrate.  No defects seen in the right lower lobe.   IMPRESSION: Low probability of pulmonary embolism.  No defects in the lung that is normally aerated at radiography.  Matched defect in the right upper lobe to the region of dense pulmonary infiltrate.  Original Report Authenticated By: Thomasenia Sales, M.D.   Dg Chest Port 1 View  02/07/2012  *RADIOLOGY REPORT*  Clinical Data: Cough, fever, history of gastric cancer  PORTABLE CHEST - 1 VIEW  Comparison: 01/17/2012; 04/29/2011; PET CT - 12/21/2011  Findings: Unchanged cardiac silhouette and mediastinal contours. Stable positioning of support apparatus.  Increased right upper lung heterogeneous air space opacities worrisome  for infection. The left hemithorax is unchanged.  No definite pleural effusion.  A skin fold overlies the peripheral aspect of the left upper lung. No definite pneumothorax.  Unchanged bones.  IMPRESSION: Worsening right upper lung pneumonia.  A follow-up chest radiograph in 4 to 6 weeks after treatment is recommended to ensure resolution.  Original Report Authenticated By: Waynard Reeds, M.D.   Dg Abd 2 Views  02/26/2012  *RADIOLOGY REPORT*  Clinical Data: Nausea and vomiting.  Hodgkin's lymphoma.  COPD.  ABDOMEN - 2 VIEW  Comparison: PET of 12/21/2011.  Findings: Upright view abdomen and a supine view of the abdomen and pelvis.  Upright view abdomen demonstrates no free intraperitoneal air or significant air fluid levels.  Interstitial thickening at the lung bases without lobar consolidation.  Supine view of the abdomen pelvis demonstrates a large colonic stool burden.  Distal gas.  No bowel distention.  Minimal convex right lumbar spine curvature.  IMPRESSION: Colonic stool burden suggests constipation.  No acute findings.  Original Report Authenticated By: Consuello Bossier, M.D.    Microbiology: Recent Results (from the past 240 hour(s))  CULTURE, BLOOD (ROUTINE X 2)     Status: Normal (Preliminary result)   Collection Time   02/22/12 10:40 PM      Component Value Range Status  Comment   Specimen Description BLOOD RIGHT ANTECUBITAL   Final    Special Requests BOTTLES DRAWN AEROBIC AND ANAEROBIC 2CC   Final    Culture  Setup Time 02/23/2012 05:04   Final    Culture     Final    Value:        BLOOD CULTURE RECEIVED NO GROWTH TO DATE CULTURE WILL BE HELD FOR 5 DAYS BEFORE ISSUING A FINAL NEGATIVE REPORT   Report Status PENDING   Incomplete   MRSA PCR SCREENING     Status: Normal   Collection Time   02/22/12 10:54 PM      Component Value Range Status Comment   MRSA by PCR NEGATIVE  NEGATIVE Final   CULTURE, BLOOD (ROUTINE X 2)     Status: Normal (Preliminary result)   Collection Time   02/22/12 11:00 PM      Component Value Range Status Comment   Specimen Description BLOOD RIGHT ARM   Final    Special Requests BOTTLES DRAWN AEROBIC AND ANAEROBIC 5CC   Final    Culture  Setup Time 02/23/2012 05:04   Final    Culture     Final    Value:        BLOOD CULTURE RECEIVED NO GROWTH TO DATE CULTURE WILL BE HELD FOR 5 DAYS BEFORE ISSUING A FINAL NEGATIVE REPORT   Report Status PENDING   Incomplete   CULTURE, EXPECTORATED SPUTUM-ASSESSMENT     Status: Normal   Collection Time   02/23/12 10:46 AM      Component Value Range Status Comment   Specimen Description SPUTUM   Final    Special Requests Immunocompromised   Final    Sputum evaluation     Final    Value: THIS SPECIMEN IS ACCEPTABLE. RESPIRATORY CULTURE REPORT TO FOLLOW.   Report Status 02/23/2012 FINAL   Final   CULTURE, RESPIRATORY     Status: Normal   Collection Time   02/23/12 10:46 AM      Component Value Range Status Comment   Specimen Description SPUTUM   Final    Special Requests NONE   Final    Gram Stain  Final    Value: NO WBC SEEN     FEW SQUAMOUS EPITHELIAL CELLS PRESENT     FEW GRAM POSITIVE COCCI IN PAIRS     FEW GRAM POSITIVE RODS   Culture NORMAL OROPHARYNGEAL FLORA   Final    Report Status 02/25/2012 FINAL   Final      Labs: Basic Metabolic Panel:  Lab 02/27/12 1610 02/26/12 0540  02/24/12 0448 02/23/12 1030 02/22/12 1630 02/22/12 1600  NA 142 142 140 138 -- 137  K 5.3* 3.3* 3.5 3.5 -- 3.1*  CL 119* 116* 110 108 -- 106  CO2 16* 16* 16* 15* -- 13*  GLUCOSE 141* 108* 224* 250* -- 104*  BUN 26* 30* 32* 33* -- 39*  CREATININE 1.48* 1.55* 1.67* 1.77* -- 1.91*  CALCIUM 8.8 8.8 9.3 9.2 -- 9.5  MG -- -- -- -- 2.1 --  PHOS -- -- -- -- -- --   Liver Function Tests:  Lab 02/22/12 1600  AST 18  ALT 12  ALKPHOS 236*  BILITOT 0.4  PROT 7.0  ALBUMIN 2.1*   No results found for this basename: LIPASE:5,AMYLASE:5 in the last 168 hours No results found for this basename: AMMONIA:5 in the last 168 hours CBC:  Lab 02/27/12 0345 02/26/12 0540 02/24/12 0448 02/23/12 1030 02/22/12 1600  WBC 7.4 7.3 9.4 8.1 13.1*  NEUTROABS -- -- -- -- 9.5*  HGB 7.9* 8.6* 8.5* 8.7* 7.0*  HCT 23.6* 26.0* 24.7* 25.2* 20.5*  MCV 84.3 84.7 82.6 82.6 78.5  PLT 127* 135* 127* 133* 166   Cardiac Enzymes:  Lab 02/22/12 1630  CKTOTAL --  CKMB --  CKMBINDEX --  TROPONINI <0.30   BNP: BNP (last 3 results)  Basename 02/22/12 1600  PROBNP 1118.0*   CBG:  Lab 02/27/12 0748 02/26/12 2135 02/26/12 1656 02/26/12 1202 02/26/12 0726  GLUCAP 113* 261* 208* 89 97    Time coordinating discharge: 40 minutes.  Signed:  Ailish Prospero A  [facility] 02/27/2012, 11:09 AM

## 2012-02-27 NOTE — Progress Notes (Signed)
Subj:  No new complaints. Feels ready to go home. No fever for > 72 hrs  Obj: AVSS NAD Chest clear RRR s M NABS, soft No edema  CXR: NSC RUL consolidation  Micro:  Sputum - NOF Blood - NGTD  IMP 1) NHL 2) RUL necrotizing PNA, NOS   REC: 1) OK for D/C home from Pulm perspective - discussed with Dr Arthor Captain 2) Prolonged course of Augmentin - 4-6 wks depending on radiographic response 3) I have arranged follow-up with [facility] in 2 wks  Rubye Oaks, NP  03/13/12 @ 10:15 for CXR          10:30 for appt   Billy Fischer, MD ; [facility] service Mobile [phone].  After 5:30 PM or weekends, call [phone]

## 2012-02-28 ENCOUNTER — Other Ambulatory Visit: Payer: Self-pay | Admitting: Nurse Practitioner

## 2012-02-28 ENCOUNTER — Telehealth: Payer: Self-pay | Admitting: Oncology

## 2012-02-28 DIAGNOSIS — C8190 Hodgkin lymphoma, unspecified, unspecified site: Secondary | ICD-10-CM

## 2012-02-28 DIAGNOSIS — D649 Anemia, unspecified: Secondary | ICD-10-CM

## 2012-02-28 NOTE — Telephone Encounter (Signed)
S/w pt re appts for 7/18 and 7/23.

## 2012-02-29 ENCOUNTER — Encounter (HOSPITAL_COMMUNITY)
Admission: RE | Admit: 2012-02-29 | Discharge: 2012-02-29 | Disposition: A | Discharge status: Home / Self Care | Payer: Medicare Other | Source: Ambulatory Visit | Attending: Oncology | Admitting: Oncology

## 2012-02-29 DIAGNOSIS — D649 Anemia, unspecified: Secondary | ICD-10-CM | POA: Insufficient documentation

## 2012-02-29 LAB — CULTURE, BLOOD (ROUTINE X 2)

## 2012-03-01 ENCOUNTER — Telehealth: Payer: Self-pay | Admitting: Nurse Practitioner

## 2012-03-01 ENCOUNTER — Other Ambulatory Visit: Payer: Medicare Other | Admitting: Lab

## 2012-03-01 ENCOUNTER — Telehealth: Payer: Self-pay | Admitting: Oncology

## 2012-03-01 NOTE — Telephone Encounter (Signed)
pt called in and needed to r/s todays appts,r/s to 7/19 and pt is aware  aom

## 2012-03-01 NOTE — Telephone Encounter (Signed)
Called pt re: FTKA lab and transfusion appt. No answer at home phone number.  Called emergency contact Patty- per Patty she just spoke to patient and he was taking a nap.  Alexia Freestone is going to attempt to contact patient and call this office back.

## 2012-03-02 ENCOUNTER — Ambulatory Visit: Payer: Medicare Other

## 2012-03-02 ENCOUNTER — Other Ambulatory Visit: Payer: Self-pay

## 2012-03-02 ENCOUNTER — Other Ambulatory Visit (HOSPITAL_BASED_OUTPATIENT_CLINIC_OR_DEPARTMENT_OTHER): Payer: Medicare Other | Admitting: Lab

## 2012-03-02 DIAGNOSIS — D649 Anemia, unspecified: Secondary | ICD-10-CM

## 2012-03-02 DIAGNOSIS — C8190 Hodgkin lymphoma, unspecified, unspecified site: Secondary | ICD-10-CM

## 2012-03-02 LAB — CBC & DIFF AND RETIC
BASO%: 0.3 % (ref 0.0–2.0)
EOS%: 12.7 % — ABNORMAL HIGH (ref 0.0–7.0)
MCH: 28.3 pg (ref 27.2–33.4)
MCHC: 33.1 g/dL (ref 32.0–36.0)
MCV: 85.4 fL (ref 79.3–98.0)
MONO%: 5 % (ref 0.0–14.0)
RDW: 20.9 % — ABNORMAL HIGH (ref 11.0–14.6)
Retic %: 1.44 % (ref 0.80–1.80)
Retic Ct Abs: 47.38 10*3/uL (ref 34.80–93.90)
lymph#: 1.5 10*3/uL (ref 0.9–3.3)

## 2012-03-02 LAB — COMPREHENSIVE METABOLIC PANEL
Albumin: 2.8 g/dL — ABNORMAL LOW (ref 3.5–5.2)
Alkaline Phosphatase: 205 U/L — ABNORMAL HIGH (ref 39–117)
BUN: 23 mg/dL (ref 6–23)
CO2: 15 mEq/L — ABNORMAL LOW (ref 19–32)
Glucose, Bld: 175 mg/dL — ABNORMAL HIGH (ref 70–99)
Potassium: 4.5 mEq/L (ref 3.5–5.3)
Total Bilirubin: 0.6 mg/dL (ref 0.3–1.2)
Total Protein: 5.7 g/dL — ABNORMAL LOW (ref 6.0–8.3)

## 2012-03-02 LAB — TECHNOLOGIST REVIEW

## 2012-03-02 LAB — SEDIMENTATION RATE: Sed Rate: 137 mm/hr — ABNORMAL HIGH (ref 0–16)

## 2012-03-02 LAB — LACTATE DEHYDROGENASE: LDH: 273 U/L — ABNORMAL HIGH (ref 94–250)

## 2012-03-02 MED ORDER — ALBUTEROL SULFATE HFA 108 (90 BASE) MCG/ACT IN AERS: INHALATION_SPRAY | RESPIRATORY_TRACT | Status: DC

## 2012-03-02 NOTE — Progress Notes (Signed)
Hgb 9.3 today.  No transfusion needed today, per Dr. Arline Asp.  Pt walked back to infusion room, and states he has SOB with exertion. T 97.9, BP 94/67 HR 136 (manually) 02 sat 97% on RA.  Pt states he would like an inhaler at home for prn use when he gets short of breath.  Pt also states he has a rash on his L abdomen and back that itches, and would like to know if he could use some kind of cream on this.  Informed MD of all this information.  He states for pt to try 1% hydrocortisone cream topically prn, and may have prescription for ventolin inhaler 1-2 puffs 2-3 times/ day prn.  Informed pt to have pharmacist show him how to use this, when he picks up prescription, and to not overuse this.  Pt verbalizes understanding and is aware of next appointment.  Pt states that he is taking his antibiotic as directed, and afinitor 10 mg every other day.  Pt to call office back regarding his prednisone- he was not sure if he was taking this or not.  MD aware.

## 2012-03-05 ENCOUNTER — Inpatient Hospital Stay (HOSPITAL_COMMUNITY)
Admission: EM | Admit: 2012-03-05 | Discharge: 2012-03-09 | DRG: 178 | Disposition: A | Discharge status: Home / Self Care | Payer: Medicare Other | Attending: Internal Medicine | Admitting: Internal Medicine

## 2012-03-05 ENCOUNTER — Encounter (HOSPITAL_COMMUNITY): Payer: Self-pay | Admitting: Emergency Medicine

## 2012-03-05 ENCOUNTER — Emergency Department (HOSPITAL_COMMUNITY): Payer: Medicare Other

## 2012-03-05 ENCOUNTER — Other Ambulatory Visit: Payer: Self-pay

## 2012-03-05 DIAGNOSIS — E860 Dehydration: Secondary | ICD-10-CM

## 2012-03-05 DIAGNOSIS — J479 Bronchiectasis, uncomplicated: Secondary | ICD-10-CM | POA: Diagnosis present

## 2012-03-05 DIAGNOSIS — E872 Acidosis, unspecified: Secondary | ICD-10-CM | POA: Diagnosis present

## 2012-03-05 DIAGNOSIS — I129 Hypertensive chronic kidney disease with stage 1 through stage 4 chronic kidney disease, or unspecified chronic kidney disease: Secondary | ICD-10-CM | POA: Diagnosis present

## 2012-03-05 DIAGNOSIS — I10 Essential (primary) hypertension: Secondary | ICD-10-CM | POA: Diagnosis present

## 2012-03-05 DIAGNOSIS — I70219 Atherosclerosis of native arteries of extremities with intermittent claudication, unspecified extremity: Secondary | ICD-10-CM | POA: Diagnosis present

## 2012-03-05 DIAGNOSIS — E785 Hyperlipidemia, unspecified: Secondary | ICD-10-CM | POA: Diagnosis present

## 2012-03-05 DIAGNOSIS — J852 Abscess of lung without pneumonia: Principal | ICD-10-CM | POA: Diagnosis present

## 2012-03-05 DIAGNOSIS — C8190 Hodgkin lymphoma, unspecified, unspecified site: Secondary | ICD-10-CM

## 2012-03-05 DIAGNOSIS — J449 Chronic obstructive pulmonary disease, unspecified: Secondary | ICD-10-CM

## 2012-03-05 DIAGNOSIS — J850 Gangrene and necrosis of lung: Secondary | ICD-10-CM

## 2012-03-05 DIAGNOSIS — I959 Hypotension, unspecified: Secondary | ICD-10-CM | POA: Diagnosis present

## 2012-03-05 DIAGNOSIS — D649 Anemia, unspecified: Secondary | ICD-10-CM | POA: Diagnosis present

## 2012-03-05 DIAGNOSIS — N183 Chronic kidney disease, stage 3 unspecified: Secondary | ICD-10-CM | POA: Diagnosis present

## 2012-03-05 DIAGNOSIS — E119 Type 2 diabetes mellitus without complications: Secondary | ICD-10-CM | POA: Diagnosis present

## 2012-03-05 DIAGNOSIS — J189 Pneumonia, unspecified organism: Secondary | ICD-10-CM

## 2012-03-05 DIAGNOSIS — D509 Iron deficiency anemia, unspecified: Secondary | ICD-10-CM | POA: Diagnosis present

## 2012-03-05 DIAGNOSIS — N179 Acute kidney failure, unspecified: Secondary | ICD-10-CM

## 2012-03-05 HISTORY — DX: Chronic kidney disease, stage 3 unspecified: N18.30

## 2012-03-05 HISTORY — DX: Anemia, unspecified: D64.9

## 2012-03-05 HISTORY — DX: Hodgkin lymphoma, unspecified, unspecified site: C81.90

## 2012-03-05 HISTORY — DX: Chronic kidney disease, stage 3 (moderate): N18.3

## 2012-03-05 LAB — CBC
MCH: 28.3 pg (ref 26.0–34.0)
MCV: 84.4 fL (ref 78.0–100.0)
Platelets: 194 10*3/uL (ref 150–400)
RBC: 2.69 MIL/uL — ABNORMAL LOW (ref 4.22–5.81)
RDW: 19.9 % — ABNORMAL HIGH (ref 11.5–15.5)

## 2012-03-05 LAB — POCT I-STAT TROPONIN I: Troponin i, poc: 0 ng/mL (ref 0.00–0.08)

## 2012-03-05 LAB — BLOOD GAS, ARTERIAL
Drawn by: 336861
O2 Content: 2 L/min
O2 Saturation: 98.2 %
pCO2 arterial: 23.1 mmHg — ABNORMAL LOW (ref 35.0–45.0)
pO2, Arterial: 134 mmHg — ABNORMAL HIGH (ref 80.0–100.0)

## 2012-03-05 LAB — COMPREHENSIVE METABOLIC PANEL
ALT: 18 U/L (ref 0–53)
AST: 16 U/L (ref 0–37)
Albumin: 1.8 g/dL — ABNORMAL LOW (ref 3.5–5.2)
CO2: 13 mEq/L — ABNORMAL LOW (ref 19–32)
Calcium: 8.2 mg/dL — ABNORMAL LOW (ref 8.4–10.5)
Creatinine, Ser: 1.91 mg/dL — ABNORMAL HIGH (ref 0.50–1.35)
Sodium: 137 mEq/L (ref 135–145)
Total Protein: 5.5 g/dL — ABNORMAL LOW (ref 6.0–8.3)

## 2012-03-05 LAB — PROCALCITONIN: Procalcitonin: 3.8 ng/mL

## 2012-03-05 MED ORDER — DEXTROSE 5 % IV SOLN
1.0000 g | Freq: Two times a day (BID) | INTRAVENOUS | Status: DC
  Filled 2012-03-05: qty 1

## 2012-03-05 MED ORDER — DEXTROSE 5 % IV SOLN: 1.0000 g | Freq: Every day | INTRAVENOUS | Status: DC

## 2012-03-05 MED ORDER — DEXTROSE 5 % IV SOLN: 1.0000 g | Freq: Three times a day (TID) | INTRAVENOUS | Status: DC

## 2012-03-05 MED ORDER — SODIUM CHLORIDE 0.9 % IV BOLUS (SEPSIS)
1000.0000 mL | Freq: Once | INTRAVENOUS | Status: AC
  Administered 2012-03-05: 1000 mL via INTRAVENOUS

## 2012-03-05 MED ORDER — MOXIFLOXACIN HCL IN NACL 400 MG/250ML IV SOLN: 400.0000 mg | INTRAVENOUS | Status: DC

## 2012-03-05 MED ORDER — VANCOMYCIN HCL 1000 MG IV SOLR
750.0000 mg | Freq: Every day | INTRAVENOUS | Status: DC
  Administered 2012-03-06: 750 mg via INTRAVENOUS
  Filled 2012-03-05: qty 750

## 2012-03-05 MED ORDER — LEVOFLOXACIN IN D5W 750 MG/150ML IV SOLN
750.0000 mg | INTRAVENOUS | Status: DC
  Administered 2012-03-06: 750 mg via INTRAVENOUS
  Filled 2012-03-05 (×2): qty 150

## 2012-03-05 MED ORDER — SODIUM CHLORIDE 0.9 % IV SOLN
1000.0000 mL | Freq: Once | INTRAVENOUS | Status: AC
  Administered 2012-03-05: 1000 mL via INTRAVENOUS

## 2012-03-05 MED ORDER — LEVOFLOXACIN IN D5W 750 MG/150ML IV SOLN: 750.0000 mg | INTRAVENOUS | Status: DC

## 2012-03-05 MED ORDER — SODIUM CHLORIDE 0.9 % IV SOLN
1000.0000 mL | INTRAVENOUS | Status: DC
  Administered 2012-03-05 – 2012-03-06 (×2): 1000 mL via INTRAVENOUS

## 2012-03-05 MED ORDER — DEXTROSE 5 % IV SOLN
1.0000 g | INTRAVENOUS | Status: DC
  Administered 2012-03-06: 1 g via INTRAVENOUS
  Filled 2012-03-05: qty 1

## 2012-03-05 NOTE — ED Notes (Signed)
Pt recently discharged w/ Dx of pneumonia, onset of shortness of breath this afternoon, has inhaler but could not get it to work. Pt lives in non-air conditioned home, EMS reported extremely hot inside.

## 2012-03-05 NOTE — Telephone Encounter (Signed)
S/w Patty re Keith Bell needs medication refills. Was asking about prednisone, vicodin and "a cough syrup". No order for cough syrup found in epic. Patty had thrown out bottle. Pt has appt w/DSM on 7/23. Determined it is best if pt discusses these w/DSM tomorrow. Informed Patty of on appt on 7/30 with [facility] as hospital follow up. Patty expressed appreciation. And will send med bottles w/pt tomorrow for his appt.

## 2012-03-05 NOTE — ED Notes (Signed)
[id number]<AV> Expected date:<BR> Expected time:<BR> Means of arrival:<BR> Comments:<BR> EMS/cough/fever

## 2012-03-05 NOTE — H&P (Signed)
Keith Bell is an 61 y.o. male.   Chief Complaint: sob HPI: 61 yo male with copd, hodgkins lymphoma has c/o sob, wheezing and cough (clear sputum) over the past 2 days.  Pt was more sob and therefore presented to Ed.  Pt notes sharp intermittent pain since during his last admission.  Resolves with pain medications. Slight diarrhea,  "loose stool".  1 bm yesterday.  Had slight brbpr, yesterday.  Just on toilet paper when he wiped.    Denies fever, chills, palp, n/v, black stool.    In ED cxr =>pneumonia improved.  Pt was hypotensive which improved with fluids.  Pt was also noted to be tachycardic.  Note was tachycardic on recent admission with negative VQ scan. Pt will be admitted for sob. Likely combination of copd and anemia.    Past Medical History  Diagnosis Date  . Dyslipidemia 2005  . Cancer     stomach  . Hypertension     No longer on BP pills x 4 years  . Hodgkin's lymphoma   . CKD (chronic kidney disease), stage III   . Anemia     Past Surgical History  Procedure Date  . No past surgeries     Family History  Problem Relation Age of Onset  . Heart disease Mother   . Heart disease Father   . Cancer Father     multiple myeloma  . Cancer Sister     breast cancer  . Cancer Sister     breast cancer   Social History:  reports that he quit smoking about 4 weeks ago. He has never used smokeless tobacco. He reports that he does not drink alcohol or use illicit drugs.  Allergies:  Allergies  Allergen Reactions  . Aspirin Nausea Only     (Not in a hospital admission)  Results for orders placed during the hospital encounter of 03/05/12 (from the past 48 hour(s))  PRO B NATRIURETIC PEPTIDE     Status: Abnormal   Collection Time   03/05/12  9:00 PM      Component Value Range Comment   Pro B Natriuretic peptide (BNP) 479.3 (*) 0 - 125 pg/mL   CBC     Status: Abnormal   Collection Time   03/05/12  9:00 PM      Component Value Range Comment   WBC 7.5  4.0 - 10.5 K/uL    RBC 2.69 (*) 4.22 - 5.81 MIL/uL    Hemoglobin 7.6 (*) 13.0 - 17.0 g/dL    HCT 16.1 (*) 09.6 - 52.0 %    MCV 84.4  78.0 - 100.0 fL    MCH 28.3  26.0 - 34.0 pg    MCHC 33.5  30.0 - 36.0 g/dL    RDW 04.5 (*) 40.9 - 15.5 %    Platelets 194  150 - 400 K/uL   COMPREHENSIVE METABOLIC PANEL     Status: Abnormal   Collection Time   03/05/12  9:00 PM      Component Value Range Comment   Sodium 137  135 - 145 mEq/L    Potassium 3.0 (*) 3.5 - 5.1 mEq/L    Chloride 111  96 - 112 mEq/L    CO2 13 (*) 19 - 32 mEq/L    Glucose, Bld 139 (*) 70 - 99 mg/dL    BUN 32 (*) 6 - 23 mg/dL    Creatinine, Ser 8.11 (*) 0.50 - 1.35 mg/dL    Calcium 8.2 (*) 8.4 - 10.5  mg/dL    Total Protein 5.5 (*) 6.0 - 8.3 g/dL    Albumin 1.8 (*) 3.5 - 5.2 g/dL    AST 16  0 - 37 U/L    ALT 18  0 - 53 U/L    Alkaline Phosphatase 166 (*) 39 - 117 U/L    Total Bilirubin 0.3  0.3 - 1.2 mg/dL    GFR calc non Af Amer 37 (*) >90 mL/min    GFR calc Af Amer 42 (*) >90 mL/min   LACTIC ACID, PLASMA     Status: Normal   Collection Time   03/05/12  9:00 PM      Component Value Range Comment   Lactic Acid, Venous 1.4  0.5 - 2.2 mmol/L   PROCALCITONIN     Status: Normal   Collection Time   03/05/12  9:00 PM      Component Value Range Comment   Procalcitonin 3.80     POCT I-STAT TROPONIN I     Status: Normal   Collection Time   03/05/12  9:09 PM      Component Value Range Comment   Troponin i, poc 0.00  0.00 - 0.08 ng/mL    Comment 3            BLOOD GAS, ARTERIAL     Status: Abnormal   Collection Time   03/05/12 10:07 PM      Component Value Range Comment   O2 Content 2.0      Delivery systems NASAL CANNULA      pH, Arterial 7.359  7.350 - 7.450    pCO2 arterial 23.1 (*) 35.0 - 45.0 mmHg    pO2, Arterial 134.0 (*) 80.0 - 100.0 mmHg    Bicarbonate 12.7 (*) 20.0 - 24.0 mEq/L    TCO2 12.2  0 - 100 mmol/L    Acid-base deficit 11.4 (*) 0.0 - 2.0 mmol/L    O2 Saturation 98.2      Patient temperature 98.6      Collection site LEFT  RADIAL      Drawn by [id number]      Sample type ARTERIAL DRAW      Allens test (pass/fail) PASS  PASS    Dg Chest 2 View  03/05/2012  *RADIOLOGY REPORT*  Clinical Data: Respiratory distress.  CHEST - 2 VIEW  Comparison: 02/27/2012.  Findings: The power port is stable.  The right upper lobe airspace process shows a slight decrease and consolidation.  The remainder the lungs are clear.  IMPRESSION: Persistent but slight improved right upper lobe pneumonia.  Original Report Authenticated By: P. Loralie Champagne, M.D.    Review of Systems  Constitutional: Negative for fever, chills, weight loss, malaise/fatigue and diaphoresis.  HENT: Negative for hearing loss, ear pain, nosebleeds, congestion, sore throat, neck pain, tinnitus and ear discharge.   Eyes: Negative for blurred vision, double vision, photophobia, pain, discharge and redness.  Respiratory: Positive for cough and shortness of breath. Negative for hemoptysis, sputum production, wheezing and stridor.   Cardiovascular: Negative for chest pain, palpitations, orthopnea, claudication, leg swelling and PND.  Gastrointestinal: Negative for heartburn, nausea, vomiting, abdominal pain, diarrhea, constipation, blood in stool and melena.  Genitourinary: Negative for dysuria, urgency, frequency, hematuria and flank pain.  Musculoskeletal: Negative for myalgias, back pain, joint pain and falls.  Skin: Negative for itching and rash.  Neurological: Negative for dizziness, tingling, tremors, sensory change, speech change, focal weakness, seizures, loss of consciousness, weakness and headaches.  Endo/Heme/Allergies: Negative for environmental allergies  and polydipsia. Does not bruise/bleed easily.  Psychiatric/Behavioral: Negative for depression, suicidal ideas, hallucinations, memory loss and substance abuse. The patient is not nervous/anxious and does not have insomnia.     Blood pressure 95/70, pulse 110, temperature 98.7 F (37.1 C), temperature source  Oral, resp. rate 22, SpO2 100.00%. Physical Exam  Constitutional: He is oriented to person, place, and time. He appears well-developed and well-nourished. No distress.  HENT:  Head: Normocephalic and atraumatic.  Mouth/Throat: No oropharyngeal exudate.  Eyes: Conjunctivae are normal. Pupils are equal, round, and reactive to light. Right eye exhibits no discharge. Left eye exhibits no discharge. No scleral icterus.  Neck: Normal range of motion. Neck supple. No JVD present. No tracheal deviation present. No thyromegaly present.  Cardiovascular: Regular rhythm and normal heart sounds.  Exam reveals no gallop and no friction rub.   No murmur heard.      tachycardic  Respiratory: Effort normal and breath sounds normal. No stridor. No respiratory distress. He has no wheezes. He has no rales. He exhibits no tenderness.       Prolonged exp phase  GI: Soft. Bowel sounds are normal. He exhibits no distension and no mass. There is no tenderness. There is no rebound and no guarding.  Genitourinary: Guaiac negative stool. No penile tenderness.  Musculoskeletal: Normal range of motion. He exhibits no edema and no tenderness.  Lymphadenopathy:    He has no cervical adenopathy.  Neurological: He is alert and oriented to person, place, and time. He has normal reflexes. He displays normal reflexes. No cranial nerve deficit. He exhibits normal muscle tone. Coordination normal.  Skin: Skin is warm and dry. No rash noted. He is not diaphoretic. No erythema. No pallor.     Assessment/Plan Hypotension, tele , cycle cardiac markers, cortisol, cardiac echo Tachycardia: ns iv, tsh, echo Pneumonia: vanco, cefepime, levofloxacin Copd:  Spiriva, albuterol Metabolic acidosis:  Likely due to afinitor CKD stage 3:  Stable Hodgkins lymphoma: f/u with oncology    Pearson Grippe 03/05/2012, 10:59 PM

## 2012-03-05 NOTE — Progress Notes (Addendum)
ANTIBIOTIC CONSULT NOTE - INITIAL  Pharmacy Consult for Vancomycin, Abx renal adjustment Indication: presumed PNA   Allergies  Allergen Reactions  . Aspirin Nausea Only   Patient Measurements:   Last weight in [facility] 7/15 = 57.4 kg  Vital Signs: Temp: 98.7 F (37.1 C) (07/22 2018) Temp src: Oral (07/22 2018) BP: 95/70 mmHg (07/22 2115) Pulse Rate: 110  (07/22 2115) Intake/Output from previous day:   Intake/Output from this shift:    Labs:  [facility] 03/05/12 2100  WBC 7.5  HGB 7.6*  PLT 194  LABCREA --  CREATININE 1.91*   The CrCl is unknown because both a height and weight (above a minimum accepted value) are required for this calculation. No results found for this basename: VANCOTROUGH:2,VANCOPEAK:2,VANCORANDOM:2,GENTTROUGH:2,GENTPEAK:2,GENTRANDOM:2,TOBRATROUGH:2,TOBRAPEAK:2,TOBRARND:2,AMIKACINPEAK:2,AMIKACINTROU:2,AMIKACIN:2, in the last 72 hours   Assessment:  51 YOM with h/o Stage IV Hodgkin's lymphoma and COPD with recent admit for necrotizing PNA now presented with respiratory distress and CXR c/w persistent but slight improved right upper lobe pneumonia.  Patient was still continued on Augmentin PTA now to start Levaquin/Cefepime/Vancomycin for HCAP.  Pharmacy may adjust antibiotics for renal function.  Scr 1.9, CrCl 33, normalized 42 ml/min  Goal of Therapy:  Vancomycin trough level 15-20 mcg/ml Appropriate renal dosing of antibiotics  Plan:   Vancomycin 750 mg IV q24h x 8 days  Change Cefepime to 1gm IV q24h x 8days  Change Levaquin to 750 mg IV q48h x 3 days  Geoffry Paradise Thi 03/05/2012,10:14 PM

## 2012-03-05 NOTE — ED Provider Notes (Addendum)
History    CSN: [id number] Arrival date & time 03/05/12  2007 First MD Initiated Contact with Patient 03/05/12 2014      Chief Complaint  Patient presents with  . Respiratory Distress    HPI The patient presents to emergency room with complaints of shortness of breath. The patient was in the hospital within the last couple weeks for a necrotizing pneumonia. He was discharged about one to 2 weeks ago and states that he felt like he was recovering at home until today when he started to become more short of breath.  He has been trying to use an inhaler at home but has not been affected. He states when he is discharged I talked about him using oxygen but he does not have any at home. He is continued on antibiotics. He has not had any fevers today. He has not had any vomiting. He has had some diarrhea however. He also mentions that he has had some trouble with a skin rash that has been ongoing for the last week and has not responded to hydrocortisone cream.  Past Medical History  Diagnosis Date  . Dyslipidemia 2005  . Cancer     stomach  . Hypertension     No longer on BP pills x 4 years    Past Surgical History  Procedure Date  . No past surgeries     Family History  Problem Relation Age of Onset  . Heart disease Mother   . Heart disease Father   . Cancer Father   . Cancer Sister   . Cancer Sister     History  Substance Use Topics  . Smoking status: Former Smoker -- 1.0 packs/day for 30 years    Quit date: 01/31/2012  . Smokeless tobacco: Never Used  . Alcohol Use: No      Review of Systems  All other systems reviewed and are negative.    Allergies  Aspirin  Home Medications   Current Outpatient Rx  Name Route Sig Dispense Refill  . ALBUTEROL SULFATE HFA 108 (90 BASE) MCG/ACT IN AERS Inhalation Inhale 2 puffs into the lungs every 4 (four) hours as needed. For shortness of breath.    . AMOXICILLIN-POT CLAVULANATE 875-125 MG PO TABS Oral Take 1 tablet by mouth 2  (two) times daily. 60 tablet 0  . EVEROLIMUS 10 MG PO TABS Oral Take 10 mg by mouth every other day.     Marland Kitchen HYDROCODONE-ACETAMINOPHEN 5-500 MG PO TABS Oral Take 1 tablet by mouth every 6 (six) hours as needed. For pain.    . ADULT MULTIVITAMIN W/MINERALS CH Oral Take 1 tablet by mouth daily.    Marland Kitchen ONDANSETRON HCL 8 MG PO TABS Oral Take 8 mg by mouth every 8 (eight) hours as needed. For nausea    . PANTOPRAZOLE SODIUM 40 MG PO TBEC Oral Take 1 tablet (40 mg total) by mouth daily. 30 tablet 6  . POTASSIUM CHLORIDE CRYS ER 20 MEQ PO TBCR Oral Take 20 mEq by mouth daily.    Marland Kitchen PREDNISONE 20 MG PO TABS Oral Take 20 mg by mouth every other day.      BP 102/70  Pulse 128  Temp 98.7 F (37.1 C) (Oral)  Resp 18  SpO2 99%  Physical Exam  Nursing note and vitals reviewed. Constitutional: No distress.       thin  HENT:  Head: Normocephalic and atraumatic.  Right Ear: External ear normal.  Left Ear: External ear normal.  Eyes: Conjunctivae are  normal. Right eye exhibits no discharge. Left eye exhibits no discharge. No scleral icterus.  Neck: Neck supple. No tracheal deviation present.  Cardiovascular: Regular rhythm and intact distal pulses.  Tachycardia present.   Pulmonary/Chest: Effort normal and breath sounds normal. No stridor. No respiratory distress. He has no wheezes. He has no rales.       Pt able to speak in full sentences  Abdominal: Soft. Bowel sounds are normal. He exhibits no distension. There is no tenderness. There is no rebound and no guarding.  Musculoskeletal: He exhibits no edema and no tenderness.  Neurological: He is alert. He has normal strength. No sensory deficit. Cranial nerve deficit:  no gross defecits noted. He exhibits normal muscle tone. He displays no seizure activity. Coordination normal.  Skin: Skin is warm and dry. Rash noted. He is not diaphoretic.       Fine macular, erythematous rash  Psychiatric: He has a normal mood and affect.    ED Course  Procedures  (including critical care time)  Rate: 130  Rhythm: sinus tachycardia  QRS Axis: left  Intervals: normal  ST/T Wave abnormalities: normal  Conduction Disutrbances:left anterior fascicular block  Narrative Interpretation:   Old EKG Reviewed: none available   CRITICAL CARE Performed by: Linwood Dibbles R Total critical care time: 40 Critical care time was exclusive of separately billable procedures and treating other patients. Critical care was necessary to treat or prevent imminent or life-threatening deterioration. Critical care was time spent personally by me on the following activities: development of treatment plan with patient and/or surrogate as well as nursing, discussions with consultants, evaluation of patient's response to treatment, examination of patient, obtaining history from patient or surrogate, ordering and performing treatments and interventions, ordering and review of laboratory studies, ordering and review of radiographic studies, pulse oximetry and re-evaluation of patient's condition.   Labs Reviewed  PRO B NATRIURETIC PEPTIDE - Abnormal; Notable for the following:    Pro B Natriuretic peptide (BNP) 479.3 (*)     All other components within normal limits  CBC - Abnormal; Notable for the following:    RBC 2.69 (*)     Hemoglobin 7.6 (*)     HCT 22.7 (*)     RDW 19.9 (*)     All other components within normal limits  COMPREHENSIVE METABOLIC PANEL - Abnormal; Notable for the following:    Potassium 3.0 (*)     CO2 13 (*)     Glucose, Bld 139 (*)     BUN 32 (*)     Creatinine, Ser 1.91 (*)     Calcium 8.2 (*)     Total Protein 5.5 (*)     Albumin 1.8 (*)     Alkaline Phosphatase 166 (*)     GFR calc non Af Amer 37 (*)     GFR calc Af Amer 42 (*)     All other components within normal limits  LACTIC ACID, PLASMA  PROCALCITONIN  POCT I-STAT TROPONIN I  BLOOD GAS, ARTERIAL   Dg Chest 2 View  03/05/2012  *RADIOLOGY REPORT*  Clinical Data: Respiratory distress.   CHEST - 2 VIEW  Comparison: 02/27/2012.  Findings: The power port is stable.  The right upper lobe airspace process shows a slight decrease and consolidation.  The remainder the lungs are clear.  IMPRESSION: Persistent but slight improved right upper lobe pneumonia.  Original Report Authenticated By: P. Loralie Champagne, M.D.    MDM  Pt with tachyardia and hypotension.  Concerning  for possible sepsis however her does not appear in distress.  He had a VQ scan last admission that did not show signs of PE.  The patient appears to have a worsening metabolic acidosis. During his prior admission he had a low bicarbonate level. It has persisted and it is now increasing.  The patient's anion gap is 13. He does have chronic renal sufficiency and this could be a contributing factor in addition to his diarrhea and dehydration.. The patient's anemia also appears to be increasing in severity. This could be contributing to his symptoms. At this time his pneumonia he does appear to be improving. I will consult the hospitalist for admission to the step down unit. Patient has been given IV fluids for his tachycardia.  This may be multifactorial.  Will start abx to cover for his persistent hcap.       Celene Kras, MD 03/05/12 2207  Celene Kras, MD 03/05/12 2239

## 2012-03-06 ENCOUNTER — Ambulatory Visit: Payer: Medicare Other | Admitting: Oncology

## 2012-03-06 ENCOUNTER — Other Ambulatory Visit: Payer: Medicare Other | Admitting: Lab

## 2012-03-06 DIAGNOSIS — D649 Anemia, unspecified: Secondary | ICD-10-CM

## 2012-03-06 LAB — MRSA PCR SCREENING: MRSA by PCR: NEGATIVE

## 2012-03-06 LAB — CARDIAC PANEL(CRET KIN+CKTOT+MB+TROPI)
CK, MB: 1.2 ng/mL (ref 0.3–4.0)
CK, MB: 1.3 ng/mL (ref 0.3–4.0)
Total CK: 14 U/L (ref 7–232)
Troponin I: <0.3 ng/mL (ref <0.30)
Troponin I: <0.3 ng/mL (ref <0.30)

## 2012-03-06 LAB — CORTISOL: Cortisol, Plasma: 29.9 ug/dL

## 2012-03-06 LAB — TSH: TSH: 0.418 u[IU]/mL (ref 0.350–4.500)

## 2012-03-06 MED ORDER — EVEROLIMUS 10 MG PO TABS: 10.0000 mg | ORAL_TABLET | ORAL | Status: DC

## 2012-03-06 MED ORDER — PREDNISONE 20 MG PO TABS
40.0000 mg | ORAL_TABLET | Freq: Two times a day (BID) | ORAL | Status: DC
  Administered 2012-03-06 – 2012-03-09 (×6): 40 mg via ORAL
  Filled 2012-03-06 (×11): qty 2

## 2012-03-06 MED ORDER — BOOST PLUS PO LIQD
237.0000 mL | Freq: Three times a day (TID) | ORAL | Status: DC
  Administered 2012-03-06: 237 mL via ORAL
  Filled 2012-03-06 (×2): qty 237

## 2012-03-06 MED ORDER — PANTOPRAZOLE SODIUM 40 MG PO TBEC
40.0000 mg | DELAYED_RELEASE_TABLET | Freq: Every day | ORAL | Status: DC
  Administered 2012-03-06 – 2012-03-09 (×4): 40 mg via ORAL
  Filled 2012-03-06 (×4): qty 1

## 2012-03-06 MED ORDER — POTASSIUM CHLORIDE CRYS ER 20 MEQ PO TBCR
20.0000 meq | EXTENDED_RELEASE_TABLET | Freq: Every day | ORAL | Status: DC
  Administered 2012-03-06 – 2012-03-09 (×4): 20 meq via ORAL
  Filled 2012-03-06: qty 2
  Filled 2012-03-06 (×5): qty 1

## 2012-03-06 MED ORDER — SODIUM CHLORIDE 0.9 % IV SOLN: INTRAVENOUS | Status: DC

## 2012-03-06 MED ORDER — SODIUM CHLORIDE 0.9 % IV SOLN
INTRAVENOUS | Status: DC
  Administered 2012-03-06 – 2012-03-09 (×4): via INTRAVENOUS

## 2012-03-06 MED ORDER — IPRATROPIUM BROMIDE 0.02 % IN SOLN: 0.5000 mg | Freq: Four times a day (QID) | RESPIRATORY_TRACT | Status: DC | PRN

## 2012-03-06 MED ORDER — BIOTENE DRY MOUTH MT LIQD
15.0000 mL | Freq: Two times a day (BID) | OROMUCOSAL | Status: DC
Start: 2012-03-07 — End: 2012-03-09
  Administered 2012-03-07 – 2012-03-09 (×5): 15 mL via OROMUCOSAL

## 2012-03-06 MED ORDER — BOOST / RESOURCE BREEZE PO LIQD
1.0000 | Freq: Three times a day (TID) | ORAL | Status: DC
  Administered 2012-03-06 – 2012-03-09 (×8): 1 via ORAL

## 2012-03-06 MED ORDER — HYDROCOD POLST-CHLORPHEN POLST 10-8 MG/5ML PO LQCR
5.0000 mL | Freq: Two times a day (BID) | ORAL | Status: DC | PRN
  Administered 2012-03-07 – 2012-03-08 (×4): 5 mL via ORAL
  Filled 2012-03-06 (×4): qty 5

## 2012-03-06 MED ORDER — SODIUM CHLORIDE 0.9 % IJ SOLN
3.0000 mL | Freq: Two times a day (BID) | INTRAMUSCULAR | Status: DC
  Administered 2012-03-06 – 2012-03-08 (×4): 3 mL via INTRAVENOUS

## 2012-03-06 MED ORDER — PREDNISONE 20 MG PO TABS
20.0000 mg | ORAL_TABLET | ORAL | Status: DC
  Administered 2012-03-06: 20 mg via ORAL
  Filled 2012-03-06: qty 1

## 2012-03-06 MED ORDER — ONDANSETRON HCL 4 MG PO TABS: 8.0000 mg | ORAL_TABLET | Freq: Three times a day (TID) | ORAL | Status: DC | PRN

## 2012-03-06 MED ORDER — POTASSIUM CHLORIDE CRYS ER 20 MEQ PO TBCR
40.0000 meq | EXTENDED_RELEASE_TABLET | ORAL | Status: AC
  Administered 2012-03-06: 40 meq via ORAL

## 2012-03-06 MED ORDER — ALBUTEROL SULFATE (5 MG/ML) 0.5% IN NEBU: 2.5000 mg | INHALATION_SOLUTION | Freq: Four times a day (QID) | RESPIRATORY_TRACT | Status: DC | PRN

## 2012-03-06 MED ORDER — ALBUTEROL SULFATE HFA 108 (90 BASE) MCG/ACT IN AERS: 2.0000 | INHALATION_SPRAY | RESPIRATORY_TRACT | Status: DC | PRN

## 2012-03-06 NOTE — Progress Notes (Signed)
Received report from ED at 0130 that pt's  labs were being drawn at that time including type and crossmatch and blood cultures and rn states she would then hang antibiotic and bring pt up.  When pt arrived on floor lab called stating blood ready, i obtained and hung blood at 0240.  At 0320 lab tech comes to room to draw cardiac enzymes but can not now that blood is running.  Called lab and ED and cardiac enzymes were not drawn with other lab work and now blood in lab is too old.   Spoke with katherine and she advised to finish unit of blood pt is on, wait one hr and draw enzymes and then start 2nd unit. Barnett Hatter P

## 2012-03-06 NOTE — Progress Notes (Signed)
[facility] PROGRESS NOTE  JORI THRALL UJW:[id number] DOB: 01/24/1951 DOA: 03/05/2012 PCP: Eino Farber, MD  Assessment/Plan: 1-COPD (chronic obstructive pulmonary disease): will continue PRN nebulizer treatment, prednisone, antibiotics (but will use just levaquin; for tx of bronchitis/bronchiectasis). Will also add spirometry and will assess home oxygen needs.  2-Bronchitis/bronchiectasis: doubt HC Associated PNA at this point. Patient X-ray improving, no fever, no elevated WBC's and his procalcitonin and lactic essentially WNL. Most likely with bronchitis/bronchiectasis precipitating his COPD. Will treat as mentioned above.  3-Hypotension: due to anemia and low intake lately. Will continue gentle fluid resuscitation; encourage PO intake and transfuse 2 units PRBC's; will follow BP today which is already improved. Cortisol pending but on steroids for COPD now.  4-Anemia: will follow Hgb after transfussion.  5-CKD (chronic kidney disease), stage III: stable. Will follow Cr trend.  6-Metabolic acidosis: most likely related to afinitor vs diarrhea; will follow electrolytes.   7-Diarrhea: according to patient has stopped now. Was recently on abx's; will check C.diff.   Code Status: Full Family Communication: no family member at bedside; but plan of care discussed with patient and all questions answered. Patient is fully competent. Disposition Plan: home when stable   Brief narrative: 61y/o male with hx of COPD, increased wheezing and lymphoma; came to the hospital with increased fatigue and SOB. Found with Hgb of 7.6, hypotensive and tachycardic. [facility] was called to admit patient for further evaluation and treatment of his anemia and SOB.  Consultants:  None  Procedures:  None  Antibiotics:  Received 1 days of vanc; cefepime and levaquin  Now only on levaquin  HPI/Subjective: Feeling slightly better; but still with some SOB. Denies CP; nausea, vomiting,  abdominal pain and fever.  Objective: Filed Vitals:   03/06/12 0500 03/06/12 0655 03/06/12 0710 03/06/12 0800  BP: 99/46 106/67 97/68   Pulse: 98 107 107   Temp: 97.7 F (36.5 C) 98.3 F (36.8 C) 98.4 F (36.9 C) 98.2 F (36.8 C)  TempSrc:  Oral Axillary Oral  Resp: 20 20 20    Height:      Weight:      SpO2: 100%       Intake/Output Summary (Last 24 hours) at 03/06/12 5621 Last data filed at 03/06/12 0710  Gross per 24 hour  Intake 1519.17 ml  Output   1300 ml  Net 219.17 ml    Exam:   General:  NAD' cooperative to examination  Cardiovascular: mild tachycardia (sinus rhythm per telemetry); no rubs or gallops  Respiratory: scattered rhonchi; positive exp wheezing; no rales  Abdomen: soft; NT/ND; positive BS  MSK: no joint swelling or erythema  Data Reviewed: Basic Metabolic Panel:  Lab 03/05/12 3086 03/02/12 0832  NA 137 145  K 3.0* 4.5  CL 111 114*  CO2 13* 15*  GLUCOSE 139* 175*  BUN 32* 23  CREATININE 1.91* 1.83*  CALCIUM 8.2* 8.4  MG -- --  PHOS -- --   Liver Function Tests:  Lab 03/05/12 2100 03/02/12 0832  AST 16 19  ALT 18 14  ALKPHOS 166* 205*  BILITOT 0.3 0.6  PROT 5.5* 5.7*  ALBUMIN 1.8* 2.8*   CBC:  Lab 03/05/12 2100 03/02/12 0832  WBC 7.5 11.1*  NEUTROABS -- 7.6*  HGB 7.6* 9.3*  HCT 22.7* 28.1*  MCV 84.4 85.4  PLT 194 163   Cardiac Enzymes:  Lab 03/06/12 0620  CKTOTAL 9  CKMB 1.2  CKMBINDEX --  TROPONINI <0.30   BNP (last 3 results)  Basename 03/05/12  2100 02/22/12 1600  PROBNP 479.3* 1118.0*    Recent Results (from the past 240 hour(s))  TECHNOLOGIST REVIEW     Status: Normal   Collection Time   03/02/12  8:32 AM      Component Value Range Status Comment   Technologist Review     Final    Value: Metas and Myelocytes present, Few Helmets and Fragments  MRSA PCR SCREENING     Status: Normal   Collection Time   03/06/12  1:49 AM      Component Value Range Status Comment   MRSA by PCR NEGATIVE  NEGATIVE Final        Studies: Dg Chest 2 View  03/05/2012  *RADIOLOGY REPORT*  Clinical Data: Respiratory distress.  CHEST - 2 VIEW  Comparison: 02/27/2012.  Findings: The power port is stable.  The right upper lobe airspace process shows a slight decrease and consolidation.  The remainder the lungs are clear.  IMPRESSION: Persistent but slight improved right upper lobe pneumonia.  Original Report Authenticated By: P. Loralie Champagne, M.D.   Dg Chest 2 View  02/27/2012  *RADIOLOGY REPORT*  Clinical Data: Cough, congestion.  CHEST - 2 VIEW  Comparison: 02/22/2012  Findings: Right Port-A-Cath remains in place, unchanged.  Dense consolidation in the right upper lobe is stable.  Left lung is clear.  Heart is normal size.  No effusions.  IMPRESSION: Stable dense right upper lobe consolidation.  Original Report Authenticated By: Cyndie Chime, M.D.   Dg Chest 2 View  02/22/2012  *RADIOLOGY REPORT*  Clinical Data: Short of breath.  Weakness and dizziness. Productive cough.  CHEST - 2 VIEW  Comparison: 02/07/2012 and multiple previous  Findings: Power port remains in place with its tip in the SVC 4 cm above the right atrium.  There is been further worsening of consolidation and volume loss in the right upper lobe.  The remainder of the lungs are clear.  No effusions.  No bony abnormalities.  Impression:  Further worsening of consolidation and volume loss in the right upper lobe.  Original Report Authenticated By: Thomasenia Sales, M.D.   Ct Chest Wo Contrast  02/23/2012  *RADIOLOGY REPORT*  Clinical Data: Right lower lobe infiltrate.  Non Hodgkin lymphoma. Shortness of breath.  CT CHEST WITHOUT CONTRAST  Technique:  Multidetector CT imaging of the chest was performed following the standard protocol without IV contrast.  Comparison: PET 12/21/2011. CT chest 12/18/2008.  Findings: No pathologically enlarged mediastinal or axillary lymph nodes.  The hilar regions are difficult to definitively evaluate without IV contrast.  Coronary  artery calcification.  Heart size normal.  No pericardial effusion.  Emphysema.  Airspace consolidation with internal rounded lucencies involving the majority of the anterior and posterior segment of the right upper lobe.  Associated air bronchograms.  Additional consolidation is seen in the medial aspect of the right lower lobe, with at least one air-fluid level (image 30), possibly with a previous site of bullous disease, as seen on 12/18/2008. Additional scattered peribronchovascular nodularity and mild consolidation in the right lower lobe, inferiorly.  7 mm subpleural left upper lobe nodule (image 36) is unchanged from 12/18/2008, indicating a benign etiology.  No pleural fluid.  Airway is unremarkable.  Incidental imaging of the upper abdomen shows no acute findings. No worrisome lytic or sclerotic lesions.  IMPRESSION:  Right upper and right lower lobe pneumonia. Associated fluid in multiple bullous lesions in the right upper and right lower lobes.  Original Report Authenticated By: Joanna Hews.  Fredirick Lathe, M.D.   Nm Pulmonary Per & Vent  02/22/2012  *RADIOLOGY REPORT*  Clinical Data: Elevated D-dimer.  Abnormal chest radiograph.  NM PULMONARY VENTILATION AND PERFUSION SCAN  Radiopharmaceutical: CURIE MAA TECHNETIUM TO 69M ALBUMIN AGGREGATED  Comparison: Chest radiography same day  Findings: Perfusion of the left lung is normal.  There is diminished perfusion of the right upper lobe, matching the dense pulmonary infiltrate.  No defects seen in the right lower lobe.  IMPRESSION: Low probability of pulmonary embolism.  No defects in the lung that is normally aerated at radiography.  Matched defect in the right upper lobe to the region of dense pulmonary infiltrate.  Original Report Authenticated By: Thomasenia Sales, M.D.   Dg Chest Port 1 View  02/07/2012  *RADIOLOGY REPORT*  Clinical Data: Cough, fever, history of gastric cancer  PORTABLE CHEST - 1 VIEW  Comparison: 01/17/2012; 04/29/2011; PET CT -  12/21/2011  Findings: Unchanged cardiac silhouette and mediastinal contours. Stable positioning of support apparatus.  Increased right upper lung heterogeneous air space opacities worrisome for infection. The left hemithorax is unchanged.  No definite pleural effusion.  A skin fold overlies the peripheral aspect of the left upper lung. No definite pneumothorax.  Unchanged bones.  IMPRESSION: Worsening right upper lung pneumonia.  A follow-up chest radiograph in 4 to 6 weeks after treatment is recommended to ensure resolution.  Original Report Authenticated By: Waynard Reeds, M.D.   Dg Abd 2 Views  02/26/2012  *RADIOLOGY REPORT*  Clinical Data: Nausea and vomiting.  Hodgkin's lymphoma.  COPD.  ABDOMEN - 2 VIEW  Comparison: PET of 12/21/2011.  Findings: Upright view abdomen and a supine view of the abdomen and pelvis.  Upright view abdomen demonstrates no free intraperitoneal air or significant air fluid levels.  Interstitial thickening at the lung bases without lobar consolidation.  Supine view of the abdomen pelvis demonstrates a large colonic stool burden.  Distal gas.  No bowel distention.  Minimal convex right lumbar spine curvature.  IMPRESSION: Colonic stool burden suggests constipation.  No acute findings.  Original Report Authenticated By: Consuello Bossier, M.D.    Scheduled Meds:   . sodium chloride  1,000 mL Intravenous Once  . levofloxacin (LEVAQUIN) IV  750 mg Intravenous Q48H  . pantoprazole  40 mg Oral Daily  . potassium chloride SA  20 mEq Oral Daily  . predniSONE  40 mg Oral BID WC  . sodium chloride  1,000 mL Intravenous Once  . sodium chloride  3 mL Intravenous Q12H  . DISCONTD: ceFEPime (MAXIPIME) IV  1 g Intravenous Q8H  . DISCONTD: ceFEPime (MAXIPIME) IV  1 g Intravenous QHS  . DISCONTD: ceFEPime (MAXIPIME) IV  1 g Intravenous Q12H  . DISCONTD: ceFEPime (MAXIPIME) IV  1 g Intravenous Q24H  . DISCONTD: everolimus  10 mg Oral QODAY  . DISCONTD: levofloxacin (LEVAQUIN) IV  750 mg  Intravenous Q24H  . DISCONTD: levofloxacin (LEVAQUIN) IV  750 mg Intravenous Q48H  . DISCONTD: moxifloxacin  400 mg Intravenous Q24H  . DISCONTD: predniSONE  20 mg Oral QODAY  . DISCONTD: vancomycin  750 mg Intravenous QHS   Continuous Infusions:   . sodium chloride    . DISCONTD: sodium chloride 1,000 mL (03/06/12 0500)  . DISCONTD: sodium chloride      Time spent: >30 minutes    Jamauri Kruzel  [facility] Pager [phone]. If 8PM-8AM, please contact night-coverage at [email or web address], password [facility] 03/06/2012, 8:33 AM  LOS: 1 day

## 2012-03-06 NOTE — Progress Notes (Addendum)
Rx communication to physician  Pt reordered Everolimus (Afinitor) from home med rec, per [facility] oral chemotherapy policy if any medication- specific conditions are met from the following checklist than the medication is held until physician review.     Everolimus (Afinitor; Zortress) . Hgb < 8  --> 7.6 . ANC < 1000 . Pltc < 50K . SCr > 1.5x baseline (or > 2 if baseline unknown)  --> SCr 1.91 . Infection --> pneumonia . Unexplained pneumonitis / hypoxemia  This pt has a SCr ~ 1.91 (up slightly from previous lab) and Hg= 7.6 with current infection documented.  Afinitor has been placed on hold.  Please review patient labs to determine if want to continue this medication.  Gean Birchwood, PharmD 03/06/12 [phone]

## 2012-03-06 NOTE — Progress Notes (Signed)
INITIAL ADULT NUTRITION ASSESSMENT Date: 03/06/2012   Time: 10:58 AM Reason for Assessment: Nutrition Risk for dysphagia  ASSESSMENT: Male 61 y.o.  Dx: SOB  Hx:  Past Medical History  Diagnosis Date  . Dyslipidemia 2005  . Cancer     stomach  . Hypertension     No longer on BP pills x 4 years  . Hodgkin's lymphoma   . CKD (chronic kidney disease), stage III   . Anemia     Related Meds:  Scheduled Meds:   . sodium chloride  1,000 mL Intravenous Once  . lactose free nutrition  237 mL Oral TID WC  . levofloxacin (LEVAQUIN) IV  750 mg Intravenous Q48H  . pantoprazole  40 mg Oral Daily  . potassium chloride SA  20 mEq Oral Daily  . potassium chloride  40 mEq Oral Q4H  . predniSONE  40 mg Oral BID WC  . sodium chloride  1,000 mL Intravenous Once  . sodium chloride  3 mL Intravenous Q12H  . DISCONTD: ceFEPime (MAXIPIME) IV  1 g Intravenous Q8H  . DISCONTD: ceFEPime (MAXIPIME) IV  1 g Intravenous QHS  . DISCONTD: ceFEPime (MAXIPIME) IV  1 g Intravenous Q12H  . DISCONTD: ceFEPime (MAXIPIME) IV  1 g Intravenous Q24H  . DISCONTD: everolimus  10 mg Oral QODAY  . DISCONTD: levofloxacin (LEVAQUIN) IV  750 mg Intravenous Q24H  . DISCONTD: levofloxacin (LEVAQUIN) IV  750 mg Intravenous Q48H  . DISCONTD: moxifloxacin  400 mg Intravenous Q24H  . DISCONTD: predniSONE  20 mg Oral QODAY  . DISCONTD: vancomycin  750 mg Intravenous QHS   Continuous Infusions:   . sodium chloride 50 mL/hr at 03/06/12 1038  . DISCONTD: sodium chloride 1,000 mL (03/06/12 0500)  . DISCONTD: sodium chloride     PRN Meds:.albuterol, ipratropium, ondansetron, DISCONTD: albuterol   Ht: 5' 6.14" (168 cm)  Wt: 126 lb 8.7 oz (57.4 kg)  Ideal Wt: 64.5 kg % Ideal Wt: 88.7% Wt Readings from Last 10 Encounters:  03/05/12 126 lb 8.7 oz (57.4 kg)  02/27/12 126 lb 9.6 oz (57.425 kg)  02/07/12 120 lb (54.432 kg)  01/24/12 122 lb 11.2 oz (55.656 kg)  01/17/12 131 lb (59.421 kg)  12/29/11 131 lb 1.6 oz (59.467  kg)  11/23/11 129 lb 9.6 oz (58.786 kg)  10/19/11 128 lb 12.8 oz (58.423 kg)  09/20/11 125 lb 14.4 oz (57.108 kg)  09/01/11 128 lb 1.6 oz (58.106 kg)    Body mass index is 20.34 kg/(m^2). (WNL)  Food/Nutrition Related Hx: Patient reported his appetite and intake have been poor. Patient drinks Boost Nutrition supplement at home but he reported he is tired of the flavor. He agreed to try [employer] Nutrition supplement. Patient familiar to RD, saw patient during previous admission on 7/10. Patient appears very thin with wasting. Patient with missing teeth but denies the need for softer diet.   Labs:  CMP     Component Value Date/Time   NA 137 03/05/2012 2100   K 3.0* 03/05/2012 2100   CL 111 03/05/2012 2100   CO2 13* 03/05/2012 2100   GLUCOSE 139* 03/05/2012 2100   BUN 32* 03/05/2012 2100   CREATININE 1.91* 03/05/2012 2100   CALCIUM 8.2* 03/05/2012 2100   PROT 5.5* 03/05/2012 2100   ALBUMIN 1.8* 03/05/2012 2100   AST 16 03/05/2012 2100   ALT 18 03/05/2012 2100   ALKPHOS 166* 03/05/2012 2100   BILITOT 0.3 03/05/2012 2100   GFRNONAA 37* 03/05/2012 2100   GFRAA  42* 03/05/2012 2100     Intake/Output Summary (Last 24 hours) at 03/06/12 1059 Last data filed at 03/06/12 0935  Gross per 24 hour  Intake 1981.67 ml  Output   1500 ml  Net 481.67 ml     Diet Order: General  Supplements/Tube Feeding: Boost TID. 1080 kcal and 42 grams protein.   IVF:    sodium chloride Last Rate: 50 mL/hr at 03/06/12 1038  DISCONTD: sodium chloride Last Rate: 1,000 mL (03/06/12 0500)  DISCONTD: sodium chloride     Estimated Nutritional Needs:   Kcal: 1610-9604 Protein: 97-116 grams Fluid: 1 ml per kcal intake.  NUTRITION DIAGNOSIS: -Increased nutrient needs (NI-5.1).  Status: Ongoing  -Inadequate Oral intake r/t poor appetite a/e/b pt reports poor appetite and intake over the past several days.   RELATED TO: diagnosis of cancer and related treatment symptoms  AS EVIDENCE BY: patient with  increased calorie and protein needs for weight maintenance/ weight gain.   MONITORING/EVALUATION(Goals): PO intake, weights, labs 1. PO intake > 75% at meals and supplements. 2. Promote weight maintenance/ weight gain.   EDUCATION NEEDS: -Education needs addressed during previous RD encounter at the cancer center.   INTERVENTION: 1. Will discontinue Boost nutrition supplement, patient dislikes.  2. Will order patient [employer] Nutrition Supplement TID. Provides 750 kcal and 27 grams of protein daily.  3. RD to follow for nutrition plan of care.   Dietitian [phone]  DOCUMENTATION CODES Per approved criteria  -Severe malnutrition in the context of chronic illness  *Patient meets malnutrition criteria due to apparent loss of both muscle mass and body fat and reported intake meets < 75% of estimated energy needs over 1 month.   Iven Finn [facility] 03/06/2012, 10:58 AM

## 2012-03-06 NOTE — Progress Notes (Signed)
CARE MANAGEMENT NOTE 03/06/2012  Patient:  Keith Bell, Keith Bell   Account Number:  [id number]  Date Initiated:  03/06/2012  Documentation initiated by:  Ulanda Tackett  Subjective/Objective Assessment:   pt with hgb 6.0 two units prbc and copd     Action/Plan:   lives at home   Anticipated DC Date:  03/09/2012   Anticipated DC Plan:  HOME/SELF CARE  In-house referral  NA      DC Planning Services  NA      [facility] Choice  NA   Choice offered to / List presented to:  NA   DME arranged  NA      DME agency  NA     HH arranged  NA  NA      HH agency  NA   Status of service:  In process, will continue to follow Medicare Important Message given?  NA - LOS <3 / Initial given by admissions (If response is "NO", the following Medicare IM given date fields will be blank) Date Medicare IM given:   Date Additional Medicare IM given:    Discharge Disposition:    Per UR Regulation:  Reviewed for med. necessity/level of care/duration of stay  If discussed at Long Length of Stay Meetings, dates discussed:    Comments:  [id number]/Keith Arca Earlene Plater, RN, BSN, CCM: CHART REVIEWED AND UPDATED. NO DISCHARGE NEEDS PRESENT AT THIS TIME. CASE MANAGEMENT [phone]

## 2012-03-06 NOTE — Progress Notes (Signed)
  Echocardiogram 2D Echocardiogram has been performed.  Keith Bell 03/06/2012, 12:39 PM

## 2012-03-07 DIAGNOSIS — N179 Acute kidney failure, unspecified: Secondary | ICD-10-CM

## 2012-03-07 LAB — TYPE AND SCREEN: Unit division: 0

## 2012-03-07 LAB — BASIC METABOLIC PANEL
GFR calc Af Amer: 55 mL/min — ABNORMAL LOW (ref >90)
GFR calc non Af Amer: 47 mL/min — ABNORMAL LOW (ref >90)
Glucose, Bld: 117 mg/dL — ABNORMAL HIGH (ref 70–99)
Potassium: 3.8 mEq/L (ref 3.5–5.1)
Sodium: 139 mEq/L (ref 135–145)

## 2012-03-07 LAB — MAGNESIUM: Magnesium: 1.4 mg/dL — ABNORMAL LOW (ref 1.5–2.5)

## 2012-03-07 LAB — CBC
Hemoglobin: 11.1 g/dL — ABNORMAL LOW (ref 13.0–17.0)
MCHC: 33.9 g/dL (ref 30.0–36.0)
Platelets: 143 10*3/uL — ABNORMAL LOW (ref 150–400)
RDW: 17.9 % — ABNORMAL HIGH (ref 11.5–15.5)

## 2012-03-07 LAB — PHOSPHORUS: Phosphorus: 2.8 mg/dL (ref 2.3–4.6)

## 2012-03-07 MED ORDER — LEVOFLOXACIN 750 MG PO TABS
750.0000 mg | ORAL_TABLET | ORAL | Status: DC
  Administered 2012-03-07 – 2012-03-09 (×2): 750 mg via ORAL
  Filled 2012-03-07 (×3): qty 1

## 2012-03-07 NOTE — Progress Notes (Signed)
[facility] PROGRESS NOTE  Keith Bell OZH:[id number] DOB: [birth year] DOA: 03/05/2012 PCP: Eino Farber, MD  Assessment/Plan:  1-COPD (chronic obstructive pulmonary disease): will continue PRN nebulizer treatment, prednisone, antibiotics (but will use just levaquin; for tx of bronchitis/bronchiectasis). Will also add spirometry and will assess home oxygen needs.  2-Bronchitis/bronchiectasis: Patient was diagnosed recently with necrotizing pneumonia, [place] pulmonology recommended for prolonged antibiotic course, recommended Augmentin for 4 weeks. At admission the patient said he had some skin rash, not sure if it's secondary to antibiotics or not. Antibiotic was switched to Levaquin.  3-Hypotension: due to anemia and low intake lately. Will continue gentle fluid resuscitation; encourage PO intake and transfuse 2 units PRBC's; will follow BP today which is already improved. Cortisol pending but on steroids for COPD now.  4-Anemia: will follow Hgb after transfussion.  5-CKD (chronic kidney disease), stage III: stable. Will follow Cr trend.  6-Metabolic acidosis: most likely related to afinitor vs diarrhea; will follow electrolytes.   7-Diarrhea: according to patient has stopped now. Was recently on abx's; will check C.diff.   Code Status: Full Family Communication: no family member at bedside; but plan of care discussed with patient and all questions answered. Patient is fully competent. Disposition Plan:  Transfer to telemetry, hold when medical stable.    Brief narrative: 60y/o male with hx of COPD, increased wheezing and lymphoma; came to the hospital with increased fatigue and SOB. Found with Hgb of 7.6, hypotensive and tachycardic. [facility] was called to admit patient for further evaluation and treatment of his anemia and SOB.  Consultants:  None  Procedures:  None  Antibiotics:  Received 1 days of vanc; cefepime and levaquin  Now only on  levaquin  HPI/Subjective: Feeling slightly better; but still with some SOB. Denies CP; nausea, vomiting, abdominal pain and fever.  Objective: Filed Vitals:   03/07/12 0400 03/07/12 0800 03/07/12 0856 03/07/12 1200  BP: 98/51  117/74   Pulse: 58  86   Temp: 97.2 F (36.2 C) 97 F (36.1 C)  97 F (36.1 C)  TempSrc: Oral Axillary  Axillary  Resp: 30  16   Height:      Weight:      SpO2: 100%  100%     Intake/Output Summary (Last 24 hours) at 03/07/12 1249 Last data filed at 03/07/12 1100  Gross per 24 hour  Intake   1168 ml  Output   2325 ml  Net  -1157 ml    Exam:   General:  NAD' cooperative to examination  Cardiovascular: mild tachycardia (sinus rhythm per telemetry); no rubs or gallops  Respiratory: scattered rhonchi; positive exp wheezing; no rales  Abdomen: soft; NT/ND; positive BS  MSK: no joint swelling or erythema  Data Reviewed: Basic Metabolic Panel:  Lab 03/07/12 6295 03/05/12 2100 03/02/12 0832  NA 139 137 145  K 3.8 3.0* 4.5  CL 114* 111 114*  CO2 12* 13* 15*  GLUCOSE 117* 139* 175*  BUN 23 32* 23  CREATININE 1.54* 1.91* 1.83*  CALCIUM 8.4 8.2* 8.4  MG 1.4* -- --  PHOS 2.8 -- --   Liver Function Tests:  Lab 03/05/12 2100 03/02/12 0832  AST 16 19  ALT 18 14  ALKPHOS 166* 205*  BILITOT 0.3 0.6  PROT 5.5* 5.7*  ALBUMIN 1.8* 2.8*   CBC:  Lab 03/07/12 0500 03/05/12 2100 03/02/12 0832  WBC 5.5 7.5 11.1*  NEUTROABS -- -- 7.6*  HGB 11.1* 7.6* 9.3*  HCT 32.7* 22.7* 28.1*  MCV 84.9  84.4 85.4  PLT 143* 194 163   Cardiac Enzymes:  Lab 03/06/12 1802 03/06/12 1250 03/06/12 0620  CKTOTAL 14 15 9   CKMB 1.3 1.2 1.2  CKMBINDEX -- -- --  TROPONINI <0.30 <0.30 <0.30   BNP (last 3 results)  Basename 03/05/12 2100 02/22/12 1600  PROBNP 479.3* 1118.0*    Recent Results (from the past 240 hour(s))  TECHNOLOGIST REVIEW     Status: Normal   Collection Time   03/02/12  8:32 AM      Component Value Range Status Comment   Technologist  Review     Final    Value: Metas and Myelocytes present, Few Helmets and Fragments  CULTURE, BLOOD (ROUTINE X 2)     Status: Normal (Preliminary result)   Collection Time   03/06/12  1:00 AM      Component Value Range Status Comment   Specimen Description BLOOD R WRIST   Final    Special Requests     Final    Value: Immunocompromised BOTTLES DRAWN AEROBIC AND ANAEROBIC   Culture  Setup Time 03/06/2012 04:11   Final    Culture     Final    Value:        BLOOD CULTURE RECEIVED NO GROWTH TO DATE CULTURE WILL BE HELD FOR 5 DAYS BEFORE ISSUING A FINAL NEGATIVE REPORT   Report Status PENDING   Incomplete   CULTURE, BLOOD (ROUTINE X 2)     Status: Normal (Preliminary result)   Collection Time   03/06/12  1:01 AM      Component Value Range Status Comment   Specimen Description BLOOD PORTA CATH   Final    Special Requests     Final    Value: Immunocompromised BOTTLES DRAWN AEROBIC AND ANAEROBIC   Culture  Setup Time 03/06/2012 04:11   Final    Culture     Final    Value:        BLOOD CULTURE RECEIVED NO GROWTH TO DATE CULTURE WILL BE HELD FOR 5 DAYS BEFORE ISSUING A FINAL NEGATIVE REPORT   Report Status PENDING   Incomplete   MRSA PCR SCREENING     Status: Normal   Collection Time   03/06/12  1:49 AM      Component Value Range Status Comment   MRSA by PCR NEGATIVE  NEGATIVE Final      Studies: Dg Chest 2 View  03/05/2012  *RADIOLOGY REPORT*  Clinical Data: Respiratory distress.  CHEST - 2 VIEW  Comparison: 02/27/2012.  Findings: The power port is stable.  The right upper lobe airspace process shows a slight decrease and consolidation.  The remainder the lungs are clear.  IMPRESSION: Persistent but slight improved right upper lobe pneumonia.  Original Report Authenticated By: P. Loralie Champagne, M.D.   Dg Chest 2 View  02/27/2012  *RADIOLOGY REPORT*  Clinical Data: Cough, congestion.  CHEST - 2 VIEW  Comparison: 02/22/2012  Findings: Right Port-A-Cath remains in place, unchanged.  Dense  consolidation in the right upper lobe is stable.  Left lung is clear.  Heart is normal size.  No effusions.  IMPRESSION: Stable dense right upper lobe consolidation.  Original Report Authenticated By: Cyndie Chime, M.D.   Dg Chest 2 View  02/22/2012  *RADIOLOGY REPORT*  Clinical Data: Short of breath.  Weakness and dizziness. Productive cough.  CHEST - 2 VIEW  Comparison: 02/07/2012 and multiple previous  Findings: Power port remains in place with its tip in the SVC 4  cm above the right atrium.  There is been further worsening of consolidation and volume loss in the right upper lobe.  The remainder of the lungs are clear.  No effusions.  No bony abnormalities.  Impression:  Further worsening of consolidation and volume loss in the right upper lobe.  Original Report Authenticated By: Thomasenia Sales, M.D.   Ct Chest Wo Contrast  02/23/2012  *RADIOLOGY REPORT*  Clinical Data: Right lower lobe infiltrate.  Non Hodgkin lymphoma. Shortness of breath.  CT CHEST WITHOUT CONTRAST  Technique:  Multidetector CT imaging of the chest was performed following the standard protocol without IV contrast.  Comparison: PET 12/21/2011. CT chest 12/18/2008.  Findings: No pathologically enlarged mediastinal or axillary lymph nodes.  The hilar regions are difficult to definitively evaluate without IV contrast.  Coronary artery calcification.  Heart size normal.  No pericardial effusion.  Emphysema.  Airspace consolidation with internal rounded lucencies involving the majority of the anterior and posterior segment of the right upper lobe.  Associated air bronchograms.  Additional consolidation is seen in the medial aspect of the right lower lobe, with at least one air-fluid level (image 30), possibly with a previous site of bullous disease, as seen on 12/18/2008. Additional scattered peribronchovascular nodularity and mild consolidation in the right lower lobe, inferiorly.  7 mm subpleural left upper lobe nodule (image 36) is  unchanged from 12/18/2008, indicating a benign etiology.  No pleural fluid.  Airway is unremarkable.  Incidental imaging of the upper abdomen shows no acute findings. No worrisome lytic or sclerotic lesions.  IMPRESSION:  Right upper and right lower lobe pneumonia. Associated fluid in multiple bullous lesions in the right upper and right lower lobes.  Original Report Authenticated By: Reyes Ivan, M.D.   Nm Pulmonary Per & Vent  02/22/2012  *RADIOLOGY REPORT*  Clinical Data: Elevated D-dimer.  Abnormal chest radiograph.  NM PULMONARY VENTILATION AND PERFUSION SCAN  Radiopharmaceutical: CURIE MAA TECHNETIUM TO 28M ALBUMIN AGGREGATED  Comparison: Chest radiography same day  Findings: Perfusion of the left lung is normal.  There is diminished perfusion of the right upper lobe, matching the dense pulmonary infiltrate.  No defects seen in the right lower lobe.  IMPRESSION: Low probability of pulmonary embolism.  No defects in the lung that is normally aerated at radiography.  Matched defect in the right upper lobe to the region of dense pulmonary infiltrate.  Original Report Authenticated By: Thomasenia Sales, M.D.   Dg Chest Port 1 View  02/07/2012  *RADIOLOGY REPORT*  Clinical Data: Cough, fever, history of gastric cancer  PORTABLE CHEST - 1 VIEW  Comparison: 01/17/2012; 04/29/2011; PET CT - 12/21/2011  Findings: Unchanged cardiac silhouette and mediastinal contours. Stable positioning of support apparatus.  Increased right upper lung heterogeneous air space opacities worrisome for infection. The left hemithorax is unchanged.  No definite pleural effusion.  A skin fold overlies the peripheral aspect of the left upper lung. No definite pneumothorax.  Unchanged bones.  IMPRESSION: Worsening right upper lung pneumonia.  A follow-up chest radiograph in 4 to 6 weeks after treatment is recommended to ensure resolution.  Original Report Authenticated By: Waynard Reeds, M.D.   Dg Abd 2 Views  02/26/2012   *RADIOLOGY REPORT*  Clinical Data: Nausea and vomiting.  Hodgkin's lymphoma.  COPD.  ABDOMEN - 2 VIEW  Comparison: PET of 12/21/2011.  Findings: Upright view abdomen and a supine view of the abdomen and pelvis.  Upright view abdomen demonstrates no free intraperitoneal air or  significant air fluid levels.  Interstitial thickening at the lung bases without lobar consolidation.  Supine view of the abdomen pelvis demonstrates a large colonic stool burden.  Distal gas.  No bowel distention.  Minimal convex right lumbar spine curvature.  IMPRESSION: Colonic stool burden suggests constipation.  No acute findings.  Original Report Authenticated By: Consuello Bossier, M.D.    Scheduled Meds:    . antiseptic oral rinse  15 mL Mouth Rinse BID  . feeding supplement  1 Container Oral TID BM  . levofloxacin (LEVAQUIN) IV  750 mg Intravenous Q48H  . pantoprazole  40 mg Oral Daily  . potassium chloride SA  20 mEq Oral Daily  . predniSONE  40 mg Oral BID WC  . sodium chloride  3 mL Intravenous Q12H   Continuous Infusions:    . sodium chloride 50 mL/hr at 03/06/12 1038    Time spent: >30 minutes    [facility] A  [facility] Pager [phone]. If 8PM-8AM, please contact night-coverage at [email or web address], password [facility] 03/07/2012, 12:49 PM  LOS: 2 days

## 2012-03-07 NOTE — Progress Notes (Signed)
List of area hhc provider accepting medicare payment given to patient.  No provider chosen at this time will discuss with family.

## 2012-03-07 NOTE — Evaluation (Signed)
Physical Therapy Evaluation Patient Details Name: Keith Bell MRN: [id number] DOB: [birth year] Today's Date: 03/07/2012 Time: 4098-1191 PT Time Calculation (min): 16 min  PT Assessment / Plan / Recommendation Clinical Impression  pt was admitted w/ hypotension. pt Independent PTA and plans return to home. lives alone. Has family available. Pt will benefit from PT to improve in functional mobility to return to Independent level.    PT Assessment  Patient needs continued PT services    Follow Up Recommendations  No PT follow up    Barriers to Discharge        Equipment Recommendations  None recommended by PT    Recommendations for Other Services     Frequency Min 3X/week    Precautions / Restrictions   monitor BP  Pertinent Vitals/Pain  Pre 117/74  76 HR  100% RA 16 RR During 122/82  HR 101  100%  23 RR 2/4 dyspnea.     Mobility  Bed Mobility Bed Mobility: Supine to Sit Supine to Sit: 7: Independent Transfers Transfers: Sit to Stand;Stand to Sit Sit to Stand: 4: Min guard;From bed Stand to Sit: To chair/3-in-1;4: Min guard Ambulation/Gait Ambulation/Gait Assistance: 4: Min assist Ambulation Distance (Feet): 20 Feet Assistive device: Rolling walker Gait velocity: slow    Exercises     PT Diagnosis:    PT Problem List: Decreased strength;Decreased activity tolerance;Decreased mobility;Cardiopulmonary status limiting activity PT Treatment Interventions: Gait training;Functional mobility training;Patient/family education   PT Goals Acute Rehab PT Goals PT Goal Formulation: With patient Time For Goal Achievement: 03/21/12 Potential to Achieve Goals: Good Pt will Ambulate: >150 feet;with modified independence;with least restrictive assistive device  Visit Information  Last PT Received On: 03/07/12 Assistance Needed: +1    Subjective Data  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  Patient Stated Goal: to return home   Prior Functioning  Home Living Lives With:  Alone Available Help at Discharge: Family;Friend(s) Type of Home: Mobile home Home Access: Stairs to enter Secretary/administrator of Steps: 2-3 Entrance Stairs-Rails: Right;Left Home Layout: One level Home Adaptive Equipment: None Prior Function Level of Independence: Independent Able to Take Stairs?: Yes Driving: No Communication Communication: No difficulties    Cognition  Overall Cognitive Status: Appears within functional limits for tasks assessed/performed Arousal/Alertness: Awake/alert Orientation Level: Appears intact for tasks assessed Behavior During Session: [facility] for tasks performed    Extremity/Trunk Assessment Right Upper Extremity Assessment RUE ROM/Strength/Tone: [facility] for tasks assessed Left Upper Extremity Assessment LUE ROM/Strength/Tone: [facility] for tasks assessed Right Lower Extremity Assessment RLE ROM/Strength/Tone: [facility] for tasks assessed Left Lower Extremity Assessment LLE ROM/Strength/Tone: [facility] for tasks assessed   Balance Dynamic Sitting Balance Dynamic Sitting - Level of Assistance: 7: Independent  End of Session PT - End of Session Activity Tolerance: Patient tolerated treatment well Patient left: in chair;with call bell/phone within reach Nurse Communication: Mobility status  GP     Rada Hay 03/07/2012, 10:00 AM  [phone]

## 2012-03-08 ENCOUNTER — Inpatient Hospital Stay (HOSPITAL_COMMUNITY): Payer: Medicare Other

## 2012-03-08 ENCOUNTER — Encounter: Payer: Self-pay | Admitting: Oncology

## 2012-03-08 DIAGNOSIS — C8190 Hodgkin lymphoma, unspecified, unspecified site: Secondary | ICD-10-CM

## 2012-03-08 DIAGNOSIS — J852 Abscess of lung without pneumonia: Principal | ICD-10-CM

## 2012-03-08 LAB — BASIC METABOLIC PANEL
Calcium: 8.4 mg/dL (ref 8.4–10.5)
Creatinine, Ser: 1.39 mg/dL — ABNORMAL HIGH (ref 0.50–1.35)
GFR calc Af Amer: 62 mL/min — ABNORMAL LOW (ref >90)
GFR calc non Af Amer: 54 mL/min — ABNORMAL LOW (ref >90)
Sodium: 138 mEq/L (ref 135–145)

## 2012-03-08 LAB — CBC
Platelets: 156 10*3/uL (ref 150–400)
RBC: 3.22 MIL/uL — ABNORMAL LOW (ref 4.22–5.81)
RDW: 18.4 % — ABNORMAL HIGH (ref 11.5–15.5)
WBC: 6.4 10*3/uL (ref 4.0–10.5)

## 2012-03-08 NOTE — Consult Note (Signed)
ONCOLOGY  HOSPITAL CONSULTATION NOTE  Keith Bell                                MR#: [id number]  DOB: Oct 15, 1950                        CSN#: [id number] Referring MD:  Primary MD:   Reason for Consult:  Hodgkin's Lymphoma  PATIENT IDENTIFICATION:    61 Year old African American male, patient of Dr. Arline Asp with a history of Hodgkin's lymphoma, diagnosed in March 2009, stage IV with positive bone marrow on 11/21/2007.He had a biopsy of a retroperitoneal lymph node on 11/13/2007 to establish the diagnosis. At that time, he had intra-abdominal disease and splenomegaly. He was having night sweats and fevers so he was stage IVB. He received Adriamycin, Velban, DTIC and Decadron approximately 4 and a half cycles from 11/29/2007 through 04/03/2008 with only a partial response. Bleomycin was omitted because of Koa's underlying COPD.  He then received Rituxan with COPP for 6 cycles from 04/18/2008 through February 2010. He had an initial partial response and then progressive disease. This was determined on 10/30/2008 by a PET-CT scan.    B) s/p RICE program for 6 cycles from 12/15/2008 through late August 2010 with an apparent complete remission.    C) recurrence of disease noted and  treated with Doxil, Navelbine and gemcitabine for 8 and a half cycles from October 15, 2009 through April 21, 2010 with a partial response.    D) s/p  brentuximab (Adcetris) from 05/25/2010 through 10/21/2010 with  progressive disease.   E) Program switched to  bendamustine, Rituxan and Neulasta on 11/16/2010 and received 9 cycles. Most recent course with that program was given on 07/18/2011. A PET scan on 03/11/2011 showed a complete response. A bone marrow on 03/31/2011 was negative.   F)08/02/2011 PET scan showed relapse. At that point, Keith Bell had received a total of 6 different chemotherapy regimens.    G)Patient started on  Afinitor 10 mg daily on September 06, 2011.On February 5th, the white count had dropped to  1.9 with an ANC of 0.5 and a platelet count of a 104,000. Afinitor was discontinued. Afinitor was restarted on 10/19/2011. By 03/20 the ANC was 0.8, and we once again  Afinitor was held until  April 3rd, when  Afinitor was started at 10 mg every other day with fairly good tolerance.Afinitor placed on hold from 7/10-7/15/2013 due to a hospitalization for COPD exacerbation and necrotizing PNA. Afinitor resumed from 7/15 till 7/22 he had to be placed on hold again due to current hospitalization.   HPI: Keith Bell was admitted to [facility] on 7/22 following a recent hospitalization for COPD exacerbation and necrotizing pneumonia on 02/22/2012-02/27/2012. During that hospitalization he had acute on chronic renal insufficiency, metabolic acidosis, elevated WBC and anemia requiring transfusion for a Hb 7.0 (with negative hemoccult). For his pneumonia he was treated with broad-spectrum antibiotics, and then with prolonged period of Augmentin with good response. On discharge, patient's symptoms had improved and he was scheduled to be seen at our office. Afinitor had been placed on hod then, until discharge when was resumed. On 7/22, he presented to ED with 2 day history of shortness of breath, wheezing and clear sputum production. He also complained of loose stools. He was dehydrated and tachycardic. A new chest x-ray on 722 showed persistent but slightly improved right upper  lobe pneumonia. After placing Afinitor on hold, the patient received nebulizer treatment, prednisone, and vancomycin x1, cefepime and Levaquin. He received oxygen and spirometry was added with good response. Fluid resuscitation was given. In addition, he received 2 units of packed RBC's or a hemoglobin of 7.6 and a hematocrit of 22.7 with improvement of counts to a hemoglobin of 11.1 and a hematocrit of 32.7. His white count and platelet counts were stable. Although he had acute renal insufficiency on admission with a creatinine of 1.91, these numbers are  improving at this time, with a creatinine of 1.54. OT and PT are involved to help on the patient's ambulation. His diarrhea appears to have resolved. He continues to be in contact precautions at this time but cultures appear negative to date.   Full code. Today Keith Bell states that symptoms are resolving, his less short of breath. We were kindly informed of the patient's admission  PMH:  1.Hodgkin's lymphoma as above.  2. Chronic obstructive pulmonary disease.  3. Peripheral vascular disease with claudication of right leg.  4. Recurrent hypokalemia.  5. History of hypertension.  6. Dyslipidemia.  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  8. Weight loss.  9. Anemia felt to be most likely due to the anemia of the patient's cancer and recent right upper lobe pneumonia.  10.Right upper lobe pneumonia diagnosed by chest x-ray on 06/04, and recent history of necrotizing pneumonia requiring hospitalization on 7/10 till 02/27/2012   Surgeries:  Past Surgical History  Procedure Date  . No past surgeries     Allergies:  Allergies  Allergen Reactions  . Aspirin Nausea Only    Medications:   Prior to Admission:  Prescriptions prior to admission  Medication Sig Dispense Refill  . albuterol (PROVENTIL HFA;VENTOLIN HFA) 108 (90 BASE) MCG/ACT inhaler Inhale 2 puffs into the lungs every 4 (four) hours as needed. For shortness of breath.      Marland Kitchen amoxicillin-clavulanate (AUGMENTIN) 875-125 MG per tablet Take 1 tablet by mouth 2 (two) times daily.  60 tablet  0  . everolimus (AFINITOR) 10 MG tablet Take 10 mg by mouth every other day.       Marland Kitchen HYDROcodone-acetaminophen (VICODIN) 5-500 MG per tablet Take 1 tablet by mouth every 6 (six) hours as needed. For pain.      . Multiple Vitamin (MULTIVITAMIN WITH MINERALS) TABS Take 1 tablet by mouth daily.      . ondansetron (ZOFRAN) 8 MG tablet Take 8 mg by mouth every 8 (eight) hours as needed. For nausea      . pantoprazole (PROTONIX) 40 MG  tablet Take 1 tablet (40 mg total) by mouth daily.  30 tablet  6  . potassium chloride SA (K-DUR,KLOR-CON) 20 MEQ tablet Take 20 mEq by mouth daily.      . predniSONE (DELTASONE) 20 MG tablet Take 20 mg by mouth every other day.        WUJ:WJXBJYNWG, chlorpheniramine-HYDROcodone, ipratropium, ondansetron  ROS: Constitutional:Negative for weight loss since last admission. Negative for fever, chills and positive for fatigue.  Eyes: Negative for blurred vision and double vision.  Respiratory:Positive for cough as above. No hemoptysis.Shortness of breath improved . No pleuritic chest pain.  Cardiovascular: Negative for chest pain. No palpitations.  GI: No nausea, vomiting, his diarrhea resolved, constipation. No change in bowel caliber. No  Melena or hematochezia. No abdominal pain.  GU: No blood in urine. No loss of urinary control. Skin: Positive  for itching on admission he states that these  had begun on the left side of his abdomen and back around 03/02/2012 results after the use of hydrocortisone cream. No petechia. No easy  Bruising. Neurological: No headaches. No motor or sensory deficits.  Family History:    Family History  Problem Relation Age of Onset  . Heart disease Mother   . Heart disease Father   . Cancer Father     multiple myeloma  . Cancer Sister     breast cancer  . Cancer Sister     breast cancer    No family history of bleeding disorders.  Social History:  reports that he quit smoking about 5 weeks ago. He has never used smokeless tobacco. He reports that he does not drink alcohol or use illicit drugs.  Physical Exam    Filed Vitals:   03/08/12 0551  BP: 133/77  Pulse: 80  Temp: 96.7 F (35.9 C)  Resp: 16      General:   56 -year-old  in no acute distress A. and O. x3  well-developed, thin HEENT: Normocephalic, atraumatic, PERRLA. Sclera anicteric Oral cavity without thrush or lesions. Neck supple. no thyromegaly, no cervical or supraclavicular  adenopathy  Lungs improved aeration. No wheezing, rhonchi or rales. No axillary masses. Breasts: not examined. Cardiac regular rate and rhythm normal S1-S2, no murmur , rubs or gallops. Distant sounds Abdomen soft nontender , bowel sounds x4. No HSM. No masses palpable.  GU/rectal: deferred. Extremities no clubbing cyanosis or edema. No bruising or petechial rash Musculoskeletal: no spinal tenderness.  Neuro: Non Focal  Labs:  CBC   Lab 03/08/12 0423 03/07/12 0500 03/05/12 2100 03/02/12 0832  WBC 6.4 5.5 7.5 11.1*  HGB 9.5* 11.1* 7.6* 9.3*  HCT 27.6* 32.7* 22.7* 28.1*  PLT 156 143* 194 163  MCV 85.7 84.9 84.4 85.4  MCH 29.5 28.8 28.3 28.3  MCHC 34.4 33.9 33.5 33.1  RDW 18.4* 17.9* 19.9* 20.9*  LYMPHSABS -- -- -- 1.5  MONOABS -- -- -- 0.6  EOSABS -- -- -- 1.4*  BASOSABS -- -- -- 0.0  BANDABS -- -- -- --     CMP    Lab 03/08/12 0423 03/07/12 0500 03/05/12 2100 03/02/12 0832  NA 138 139 137 145  K 3.5 3.8 3.0* 4.5  CL 114* 114* 111 114*  CO2 11* 12* 13* 15*  GLUCOSE 125* 117* 139* 175*  BUN 24* 23 32* 23  CREATININE 1.39* 1.54* 1.91* 1.83*  CALCIUM 8.4 8.4 8.2* 8.4  MG -- 1.4* -- --  AST -- -- 16 19  ALT -- -- 18 14  ALKPHOS -- -- 166* 205*  BILITOT -- -- 0.3 0.6        Component Value Date/Time   BILITOT 0.3 03/05/2012 2100     Imaging Studies:  Dg Chest 2 View  03/05/2012  *RADIOLOGY REPORT*  Clinical Data: Respiratory distress.  CHEST - 2 VIEW  Comparison: 02/27/2012.  Findings: The power port is stable.  The right upper lobe airspace process shows a slight decrease and consolidation.  The remainder the lungs are clear.  IMPRESSION: Persistent but slight improved right upper lobe pneumonia.  Original Report Authenticated By: P. Loralie Champagne, M.D.   Dg Chest 2 View  02/27/2012  *RADIOLOGY REPORT*  Clinical Data: Cough, congestion.  CHEST - 2 VIEW  Comparison: 02/22/2012  Findings: Right Port-A-Cath remains in place, unchanged.  Dense consolidation in the  right upper lobe is stable.  Left lung is clear.  Heart is normal size.  No effusions.  IMPRESSION: Stable dense right upper lobe consolidation.  Original Report Authenticated By: Cyndie Chime, M.D.    Ct Chest Wo Contrast  02/23/2012  *RADIOLOGY REPORT*  Clinical Data: Right lower lobe infiltrate.  Non Hodgkin lymphoma. Shortness of breath.  CT CHEST WITHOUT CONTRAST  Technique:  Multidetector CT imaging of the chest was performed following the standard protocol without IV contrast.  Comparison: PET 12/21/2011. CT chest 12/18/2008.  Findings: No pathologically enlarged mediastinal or axillary lymph nodes.  The hilar regions are difficult to definitively evaluate without IV contrast.  Coronary artery calcification.  Heart size normal.  No pericardial effusion.  Emphysema.  Airspace consolidation with internal rounded lucencies involving the majority of the anterior and posterior segment of the right upper lobe.  Associated air bronchograms.  Additional consolidation is seen in the medial aspect of the right lower lobe, with at least one air-fluid level (image 30), possibly with a previous site of bullous disease, as seen on 12/18/2008. Additional scattered peribronchovascular nodularity and mild consolidation in the right lower lobe, inferiorly.  7 mm subpleural left upper lobe nodule (image 36) is unchanged from 12/18/2008, indicating a benign etiology.  No pleural fluid.  Airway is unremarkable.  Incidental imaging of the upper abdomen shows no acute findings. No worrisome lytic or sclerotic lesions.  IMPRESSION:  Right upper and right lower lobe pneumonia. Associated fluid in multiple bullous lesions in the right upper and right lower lobes.  Original Report Authenticated By: Reyes Ivan, M.D.   Nm Pulmonary Per & Vent  02/22/2012  *RADIOLOGY REPORT*  Clinical Data: Elevated D-dimer.  Abnormal chest radiograph.  NM PULMONARY VENTILATION AND PERFUSION SCAN  Radiopharmaceutical: CURIE MAA  TECHNETIUM TO 69M ALBUMIN AGGREGATED  Comparison: Chest radiography same day  Findings: Perfusion of the left lung is normal.  There is diminished perfusion of the right upper lobe, matching the dense pulmonary infiltrate.  No defects seen in the right lower lobe.  IMPRESSION: Low probability of pulmonary embolism.  No defects in the lung that is normally aerated at radiography.  Matched defect in the right upper lobe to the region of dense pulmonary infiltrate.  Original Report Authenticated By: Thomasenia Sales, M.D.   Other prior  IMAGING STUDIES:   1. PET scan on 04/16/2010 showed persistent mesenteric and retroperitoneal lymphadenopathy of the abdomen. A spleen lesions seen on the prior study of 01/12/2010 also a PET scan was no longer visualized and no new disease was seen.   2. PET scan from 07/19/2010 showed stable size of retroperitoneal/mesenteric adenopathy with decreased metabolic activity, and no evidence of new or progressive disease. This study was compared with the previous study of 04/16/2010.   3. PET scan from 11/08/2010 showed lymphoma recurrence above and below the diaphragm with new intense hypermetabolic adenopathy. Hypermetabolic nodes include right supraclavicular, mediastinal and subcarinal adenopathy. There was a new hypermetabolic pulmonary nodule within the left lower lobe consistent with a pulmonary metastasis. There was bulky periaortic hypermetabolic lymph nodes which were increased in size and metabolic activity. There was concern for new splenic metastatic focus. There was a single hypermetabolic right external iliac lymph node. There was also hypermetabolism within the cecum without evidence of mass.   4. PET scan from 03/11/2011 showed interval complete metabolic response to therapy of the previously identified widespread Hodgkin lymphoma above and below the diaphragm with no evidence for residual metabolically active lymphoma. The unenhanced CT scan showed interval marked  reduction in lymphadenopathy in the  mediastinum and the  abdominal retroperitoneum.   5. PET scan from 08/02/2011 showed development of hypermetabolic nodes within the chest, abdomen and pelvis consistent with residual/recurrent disease. There was a probable low right cervical hypermetabolic node. There was a right lung base hypermetabolic nodule. The patient has underlying centrilobular emphysema.   6. PET scan on 12/21/2011 showed minimal increase in the metabolic activity associated with a right hilar lymph node and a left periaortic lymph node. There was no evidence for any new adenopathy. There were no hypermetabolic retroperitoneal lymph nodes, or any abnormal activity within the spleen, pelvis or inguinal lymph nodes. No focal hypermetabolic activity to suggest skeletal metastasis.   A/P: 62 y.o. male with a history of Hodgkin's lymphoma, s/p multiple chemotherapy regimens, most recently on Afinitor 10 mg every other day, currently on hold due to admission for the management of respiratory symptoms along symptomatic anemia, hypotension, diarrhea and acute renal insufficiency managed by the Hospitalist team. Patient's condition has improved. We'll continue to hold asthenic or until discharged, and then he is to resume the drug. Dr. Arline Asp  is to see the patient following this consult and an addendum is to be written.  Thank you for the care given to this patient, and for notifying as of his admission.  [facility] 03/08/2012 7:51 AM   The patient is well known to me dating back over 4 years to March 2009 when he was diagnosed with stage IVB Hodgkin's lymphoma. He has exhausted virtually all of the systemic treatments with well-established efficacy against Hodgkin's lymphoma and is now on Afinitor which seems to be holding his disease in check. Unfortunately the patient has had a couple of admissions over the past several weeks for a necrotizing pneumonia involving the right upper lobe for  which he is now on prolonged Augmentin. This most recent admission to the hospital on 03/05/2012 was associated with a drop in the hemoglobin to 7.6 for reasons that are unclear. Patient seems improved after receiving 2 units of packed red cells. I agree with Dr. Arthor Captain that patient may be ready for discharge in the morning in the absence of any change in his clinical condition. Because of his admission to the hospital on 03/05/2012, the patient missed his scheduled appointment with me on Tuesday, 03/06/2012.. We will reschedule him for an appointment next week.   [name] 03/08/2012 8:40 PM

## 2012-03-08 NOTE — Progress Notes (Signed)
[facility] PROGRESS NOTE  ANCEL EASLER ZOX:[id number] DOB: [birth year] DOA: 03/05/2012 PCP: Eino Farber, MD  Assessment/Plan:  1-COPD (chronic obstructive pulmonary disease): will continue PRN nebulizer treatment, prednisone, antibiotics (but will use just levaquin; for tx of bronchitis/bronchiectasis). Will also add spirometry and will assess home oxygen needs.  2-Bronchitis/bronchiectasis: Patient was diagnosed recently with necrotizing pneumonia, [place] pulmonology recommended for prolonged antibiotic course, recommended Augmentin for 4 weeks. At admission the patient said he had some skin rash, not sure if it's secondary to antibiotics or not. Antibiotic was switched to Levaquin.  3-Hypotension: due to anemia and low intake lately. Will continue gentle fluid resuscitation; encourage PO intake and transfuse 2 units PRBC's; blood pressure is stable, status post blood transfusion.  4-Anemia: Hemoglobin stable after transfusion, followed again in the morning.  5-CKD (chronic kidney disease), stage III: stable. Will follow Cr trend.  6-Metabolic acidosis: This is chronic likely secondary to the Afinitor versus CKD, patient is asymptomatic, ABG was done 03/05/2012 showed pH of 7.35, PCO2 23 and PO2 of 134. Afinitor reported to cause low serum bicarbonate levels  7-Diarrhea: according to patient has stopped now. Was recently on abx's; will check C.diff.   Code Status: Full Family Communication: no family member at bedside; but plan of care discussed with patient and all questions answered. Patient is fully competent. Disposition Plan:  Home, when medically stable. Likely in a.m.   Brief narrative: 61y/o male with hx of COPD, increased wheezing and lymphoma; came to the hospital with increased fatigue and SOB. Found with Hgb of 7.6, hypotensive and tachycardic. [facility] was called to admit patient for further evaluation and treatment of his anemia and  SOB.  Consultants:  None  Procedures:  None  Antibiotics:  Received 1 days of vanc; cefepime and levaquin  Now only on levaquin  HPI/Subjective: Feeling slightly better; but still with some SOB. Denies CP; nausea, vomiting, abdominal pain and fever.  Objective: Filed Vitals:   03/07/12 1200 03/07/12 1500 03/07/12 2154 03/08/12 0551  BP:  131/79 118/72 133/77  Pulse: 78 59 79 80  Temp: 97 F (36.1 C) 97.4 F (36.3 C) 96.7 F (35.9 C) 96.7 F (35.9 C)  TempSrc: Axillary Oral Axillary Axillary  Resp:  12 16 16   Height:      Weight:    56.5 kg (124 lb 9 oz)  SpO2: 100% 100% 99% 100%    Intake/Output Summary (Last 24 hours) at 03/08/12 1232 Last data filed at 03/08/12 0700  Gross per 24 hour  Intake   1550 ml  Output   1960 ml  Net   -410 ml    Exam:   General:  NAD' cooperative to examination  Cardiovascular: mild tachycardia (sinus rhythm per telemetry); no rubs or gallops  Respiratory: scattered rhonchi; positive exp wheezing; no rales  Abdomen: soft; NT/ND; positive BS  MSK: no joint swelling or erythema  Data Reviewed: Basic Metabolic Panel:  Lab 03/08/12 8119 03/07/12 0500 03/05/12 2100 03/02/12 0832  NA 138 139 137 145  K 3.5 3.8 3.0* 4.5  CL 114* 114* 111 114*  CO2 11* 12* 13* 15*  GLUCOSE 125* 117* 139* 175*  BUN 24* 23 32* 23  CREATININE 1.39* 1.54* 1.91* 1.83*  CALCIUM 8.4 8.4 8.2* 8.4  MG -- 1.4* -- --  PHOS -- 2.8 -- --   Liver Function Tests:  Lab 03/05/12 2100 03/02/12 0832  AST 16 19  ALT 18 14  ALKPHOS 166* 205*  BILITOT 0.3 0.6  PROT  5.5* 5.7*  ALBUMIN 1.8* 2.8*   CBC:  Lab 03/08/12 0423 03/07/12 0500 03/05/12 2100 03/02/12 0832  WBC 6.4 5.5 7.5 11.1*  NEUTROABS -- -- -- 7.6*  HGB 9.5* 11.1* 7.6* 9.3*  HCT 27.6* 32.7* 22.7* 28.1*  MCV 85.7 84.9 84.4 85.4  PLT 156 143* 194 163   Cardiac Enzymes:  Lab 03/06/12 1802 03/06/12 1250 03/06/12 0620  CKTOTAL 14 15 9   CKMB 1.3 1.2 1.2  CKMBINDEX -- -- --  TROPONINI  <0.30 <0.30 <0.30   BNP (last 3 results)  Basename 03/05/12 2100 02/22/12 1600  PROBNP 479.3* 1118.0*    Recent Results (from the past 240 hour(s))  TECHNOLOGIST REVIEW     Status: Normal   Collection Time   03/02/12  8:32 AM      Component Value Range Status Comment   Technologist Review     Final    Value: Metas and Myelocytes present, Few Helmets and Fragments  CULTURE, BLOOD (ROUTINE X 2)     Status: Normal (Preliminary result)   Collection Time   03/06/12  1:00 AM      Component Value Range Status Comment   Specimen Description BLOOD R WRIST   Final    Special Requests     Final    Value: Immunocompromised BOTTLES DRAWN AEROBIC AND ANAEROBIC   Culture  Setup Time 03/06/2012 04:11   Final    Culture     Final    Value:        BLOOD CULTURE RECEIVED NO GROWTH TO DATE CULTURE WILL BE HELD FOR 5 DAYS BEFORE ISSUING A FINAL NEGATIVE REPORT   Report Status PENDING   Incomplete   CULTURE, BLOOD (ROUTINE X 2)     Status: Normal (Preliminary result)   Collection Time   03/06/12  1:01 AM      Component Value Range Status Comment   Specimen Description BLOOD PORTA CATH   Final    Special Requests     Final    Value: Immunocompromised BOTTLES DRAWN AEROBIC AND ANAEROBIC   Culture  Setup Time 03/06/2012 04:11   Final    Culture     Final    Value:        BLOOD CULTURE RECEIVED NO GROWTH TO DATE CULTURE WILL BE HELD FOR 5 DAYS BEFORE ISSUING A FINAL NEGATIVE REPORT   Report Status PENDING   Incomplete   MRSA PCR SCREENING     Status: Normal   Collection Time   03/06/12  1:49 AM      Component Value Range Status Comment   MRSA by PCR NEGATIVE  NEGATIVE Final      Studies: Dg Chest 2 View  03/05/2012  *RADIOLOGY REPORT*  Clinical Data: Respiratory distress.  CHEST - 2 VIEW  Comparison: 02/27/2012.  Findings: The power port is stable.  The right upper lobe airspace process shows a slight decrease and consolidation.  The remainder the lungs are clear.  IMPRESSION: Persistent but  slight improved right upper lobe pneumonia.  Original Report Authenticated By: P. Loralie Champagne, M.D.   Dg Chest 2 View  02/27/2012  *RADIOLOGY REPORT*  Clinical Data: Cough, congestion.  CHEST - 2 VIEW  Comparison: 02/22/2012  Findings: Right Port-A-Cath remains in place, unchanged.  Dense consolidation in the right upper lobe is stable.  Left lung is clear.  Heart is normal size.  No effusions.  IMPRESSION: Stable dense right upper lobe consolidation.  Original Report Authenticated By: Cyndie Chime, M.D.  Dg Chest 2 View  02/22/2012  *RADIOLOGY REPORT*  Clinical Data: Short of breath.  Weakness and dizziness. Productive cough.  CHEST - 2 VIEW  Comparison: 02/07/2012 and multiple previous  Findings: Power port remains in place with its tip in the SVC 4 cm above the right atrium.  There is been further worsening of consolidation and volume loss in the right upper lobe.  The remainder of the lungs are clear.  No effusions.  No bony abnormalities.  Impression:  Further worsening of consolidation and volume loss in the right upper lobe.  Original Report Authenticated By: Thomasenia Sales, M.D.   Ct Chest Wo Contrast  02/23/2012  *RADIOLOGY REPORT*  Clinical Data: Right lower lobe infiltrate.  Non Hodgkin lymphoma. Shortness of breath.  CT CHEST WITHOUT CONTRAST  Technique:  Multidetector CT imaging of the chest was performed following the standard protocol without IV contrast.  Comparison: PET 12/21/2011. CT chest 12/18/2008.  Findings: No pathologically enlarged mediastinal or axillary lymph nodes.  The hilar regions are difficult to definitively evaluate without IV contrast.  Coronary artery calcification.  Heart size normal.  No pericardial effusion.  Emphysema.  Airspace consolidation with internal rounded lucencies involving the majority of the anterior and posterior segment of the right upper lobe.  Associated air bronchograms.  Additional consolidation is seen in the medial aspect of the right lower  lobe, with at least one air-fluid level (image 30), possibly with a previous site of bullous disease, as seen on 12/18/2008. Additional scattered peribronchovascular nodularity and mild consolidation in the right lower lobe, inferiorly.  7 mm subpleural left upper lobe nodule (image 36) is unchanged from 12/18/2008, indicating a benign etiology.  No pleural fluid.  Airway is unremarkable.  Incidental imaging of the upper abdomen shows no acute findings. No worrisome lytic or sclerotic lesions.  IMPRESSION:  Right upper and right lower lobe pneumonia. Associated fluid in multiple bullous lesions in the right upper and right lower lobes.  Original Report Authenticated By: Reyes Ivan, M.D.   Nm Pulmonary Per & Vent  02/22/2012  *RADIOLOGY REPORT*  Clinical Data: Elevated D-dimer.  Abnormal chest radiograph.  NM PULMONARY VENTILATION AND PERFUSION SCAN  Radiopharmaceutical: CURIE MAA TECHNETIUM TO 50M ALBUMIN AGGREGATED  Comparison: Chest radiography same day  Findings: Perfusion of the left lung is normal.  There is diminished perfusion of the right upper lobe, matching the dense pulmonary infiltrate.  No defects seen in the right lower lobe.  IMPRESSION: Low probability of pulmonary embolism.  No defects in the lung that is normally aerated at radiography.  Matched defect in the right upper lobe to the region of dense pulmonary infiltrate.  Original Report Authenticated By: Thomasenia Sales, M.D.   Dg Chest Port 1 View  02/07/2012  *RADIOLOGY REPORT*  Clinical Data: Cough, fever, history of gastric cancer  PORTABLE CHEST - 1 VIEW  Comparison: 01/17/2012; 04/29/2011; PET CT - 12/21/2011  Findings: Unchanged cardiac silhouette and mediastinal contours. Stable positioning of support apparatus.  Increased right upper lung heterogeneous air space opacities worrisome for infection. The left hemithorax is unchanged.  No definite pleural effusion.  A skin fold overlies the peripheral aspect of the left upper  lung. No definite pneumothorax.  Unchanged bones.  IMPRESSION: Worsening right upper lung pneumonia.  A follow-up chest radiograph in 4 to 6 weeks after treatment is recommended to ensure resolution.  Original Report Authenticated By: Waynard Reeds, M.D.   Dg Abd 2 Views  02/26/2012  *RADIOLOGY  REPORT*  Clinical Data: Nausea and vomiting.  Hodgkin's lymphoma.  COPD.  ABDOMEN - 2 VIEW  Comparison: PET of 12/21/2011.  Findings: Upright view abdomen and a supine view of the abdomen and pelvis.  Upright view abdomen demonstrates no free intraperitoneal air or significant air fluid levels.  Interstitial thickening at the lung bases without lobar consolidation.  Supine view of the abdomen pelvis demonstrates a large colonic stool burden.  Distal gas.  No bowel distention.  Minimal convex right lumbar spine curvature.  IMPRESSION: Colonic stool burden suggests constipation.  No acute findings.  Original Report Authenticated By: Consuello Bossier, M.D.    Scheduled Meds:    . antiseptic oral rinse  15 mL Mouth Rinse BID  . feeding supplement  1 Container Oral TID BM  . levofloxacin  750 mg Oral QODAY  . pantoprazole  40 mg Oral Daily  . potassium chloride SA  20 mEq Oral Daily  . predniSONE  40 mg Oral BID WC  . sodium chloride  3 mL Intravenous Q12H  . DISCONTD: levofloxacin (LEVAQUIN) IV  750 mg Intravenous Q48H   Continuous Infusions:    . sodium chloride 50 mL/hr at 03/08/12 1217    Time spent: >30 minutes    [facility]  [facility] Pager [phone]. If 8PM-8AM, please contact night-coverage at [email or web address], password [facility] 03/08/2012, 12:32 PM  LOS: 3 days

## 2012-03-08 NOTE — Progress Notes (Signed)
Keith Bell has had the following admissions to the hospital:  From 02/07/2012 through 02/10/2012 for right upper lobe pneumonia.  From 02/22/2012 through 02/27/2012 with a diagnosis of a necrotizing pneumonia, polymicrobial for which he was placed on Augmentin, to be continued for at least 4 weeks. Keith Bell was seen in consultation by Dr. Billy Fischer.  From 03/05/2012 through 03/09/2012 for shortness of breath in association with a drop in his hemoglobin to 7.6 requiring 2 units of packed red cells.

## 2012-03-08 NOTE — Progress Notes (Signed)
ANTIBIOTIC CONSULT NOTE   Pharmacy Consult for Levaquin, Abx renal adjustment Indication: presumed PNA   Allergies  Allergen Reactions  . Aspirin Nausea Only   Patient Measurements: Height: 5' 6.14" (168 cm) Weight: 124 lb 9 oz (56.5 kg) IBW/kg (Calculated) : 64.13   Vital Signs: Temp: 96.7 F (35.9 C) (07/25 0551) Temp src: Axillary (07/25 0551) BP: 133/77 mmHg (07/25 0551) Pulse Rate: 80  (07/25 0551) Intake/Output from previous day: 07/24 0701 - 07/25 0700 In: 2280 [P.O.:1080; I.V.:1200] Out: 2685 [Urine:2685]  Labs:  Basename 03/08/12 0423 03/07/12 0500 03/05/12 2100  WBC 6.4 5.5 7.5  HGB 9.5* 11.1* 7.6*  PLT 156 143* 194  LABCREA -- -- --  CREATININE 1.39* 1.54* 1.91*   Estimated Creatinine Clearance: 45.2 ml/min (by C-G formula based on Cr of 1.39).    Assessment:  75 YOM with h/o Stage IV Hodgkin's lymphoma and COPD with recent admit for necrotizing PNA now presented with respiratory distress and CXR c/w persistent but slight improved right upper lobe pneumonia.    Day #4 Levaquin (q48h dosing for renal function).  Cefepime and vanc were d/c on 7/24.  Renal function has improved, CrCl ~ 45 ml/min  Goal of Therapy:  Appropriate renal dosing of antibiotics  Plan:   Continue Levaquin 750mg  PO q48h  Follow up renal fxn; adjust dose as necessary.  Lynann Beaver PharmD, BCPS Pager [phone] 03/08/2012 10:21 AM

## 2012-03-08 NOTE — Clinical Documentation Improvement (Signed)
MALNUTRITION DOCUMENTATION CLARIFICATION  THIS DOCUMENT IS NOT A PERMANENT PART OF THE MEDICAL RECORD  TO RESPOND TO THE THIS QUERY, FOLLOW THE INSTRUCTIONS BELOW:  1. If needed, update documentation for the patient's encounter via the notes activity.  2. Access this query again and click edit on the In [employer].  3. After updating, or not, click F2 to complete all highlighted (required) fields concerning your review. Select "additional documentation in the medical record" OR "no additional documentation provided".  4. Click Sign note button.  5. The deficiency will fall out of your In Basket *Please let us know if you are not able to complete this workflow by phone or e-mail (listed below).  Please update your documentation within the medical record to reflect your response to this query.                                                                                        03/08/12   Dear Dr.M [facility],  In a better effort to capture your patient's severity of illness, reflect appropriate length of stay and utilization of resources, a review of the patient medical record has revealed the following indicators.    Based on your clinical judgment, please clarify and document in a progress note and/or discharge summary the clinical condition associated with the following supporting information:  In responding to this query please exercise your independent judgment.  The fact that a query is asked, does not imply that any particular answer is desired or expected.  03/06/12 Nutrition Assessment w/ documentation criteria for "Severe malnutrition in the context of chronic illness". For accurate Dx specificity & severity please help validate nutr documentation for cond being eval'd, mon'd & tx'd. Thank you  Possible Clinical Conditions?  Mild Malnutrition  Moderate Malnutrition  Severe Malnutrition   Protein Calorie Malnutrition Emaciation  Cachexia    Other  Condition Cannot clinically determine  Supporting Information: Risk Factors: See Nutrition Assesment 03/06/12  Signs & Symptoms: See Nutrition Assesment 03/06/12  -Diagnostics: See Nutrition Assesment 03/06/12  Treatments: See Nutrition Assesment 03/06/12  -Medications:  -Nutrition Consult: See Nutrition Assesment 03/06/12   You may use possible, probable, or suspect with inpatient documentation. possible, probable, suspected diagnoses MUST be documented at the time of discharge  Reviewed: additional documentation in the medical record  Thank You,  Toribio Harbour, RN, BSN, CCDS Certified Clinical Documentation Specialist Pager: [phone]  Health Information Management [place]

## 2012-03-09 ENCOUNTER — Telehealth: Payer: Self-pay | Admitting: Oncology

## 2012-03-09 ENCOUNTER — Other Ambulatory Visit: Payer: Self-pay

## 2012-03-09 DIAGNOSIS — C8190 Hodgkin lymphoma, unspecified, unspecified site: Secondary | ICD-10-CM

## 2012-03-09 LAB — BASIC METABOLIC PANEL
BUN: 27 mg/dL — ABNORMAL HIGH (ref 6–23)
Chloride: 111 mEq/L (ref 96–112)
GFR calc Af Amer: 54 mL/min — ABNORMAL LOW (ref >90)
GFR calc non Af Amer: 47 mL/min — ABNORMAL LOW (ref >90)
Glucose, Bld: 143 mg/dL — ABNORMAL HIGH (ref 70–99)
Potassium: 2.8 mEq/L — ABNORMAL LOW (ref 3.5–5.1)
Sodium: 136 mEq/L (ref 135–145)

## 2012-03-09 LAB — CBC
HCT: 26.4 % — ABNORMAL LOW (ref 39.0–52.0)
Hemoglobin: 9.2 g/dL — ABNORMAL LOW (ref 13.0–17.0)
WBC: 6.3 10*3/uL (ref 4.0–10.5)

## 2012-03-09 MED ORDER — HEPARIN SOD (PORK) LOCK FLUSH 100 UNIT/ML IV SOLN
500.0000 [IU] | INTRAVENOUS | Status: AC | PRN
  Administered 2012-03-09: 500 [IU]

## 2012-03-09 MED ORDER — POTASSIUM CHLORIDE CRYS ER 20 MEQ PO TBCR
60.0000 meq | EXTENDED_RELEASE_TABLET | ORAL | Status: AC
  Administered 2012-03-09 (×2): 60 meq via ORAL
  Filled 2012-03-09 (×2): qty 3

## 2012-03-09 MED ORDER — LEVOFLOXACIN 750 MG PO TABS: 750.0000 mg | ORAL_TABLET | ORAL | Status: AC

## 2012-03-09 NOTE — Progress Notes (Signed)
Physical Therapy Treatment Patient Details Name: Keith Bell MRN: [id number] DOB: [birth year] Today's Date: 03/09/2012 Time: 4098-1191 PT Time Calculation (min): 20 min  PT Assessment / Plan / Recommendation Comments on Treatment Session  pt states he feels much better and plans to go home today.    Follow Up Recommendations  No PT follow up    Barriers to Discharge        Equipment Recommendations  None recommended by PT    Recommendations for Other Services    Frequency     Plan Discharge plan remains appropriate    Precautions / Restrictions     Pertinent Vitals/Pain none    Mobility  Bed Mobility Bed Mobility: Supine to Sit;Sitting - Scoot to Edge of Bed Supine to Sit: 7: Independent Sitting - Scoot to Edge of Bed: 7: Independent  Transfers Transfers: Sit to Stand;Stand to Sit Sit to Stand: 6: Modified independent (Device/Increase time);From bed Stand to Sit: 6: Modified independent (Device/Increase time);To chair/3-in-1 Details for Transfer Assistance: good use of hands and safety tech  Ambulation/Gait Ambulation/Gait Assistance: 6: Modified independent (Device/Increase time) Ambulation Distance (Feet): 500 Feet Assistive device: None Ambulation/Gait Assistance Details: good alternating gait with no AD.  One VC on safety with backward gait to recliner.  No LOB. No c/o. Gait Pattern: Step-through pattern Gait velocity: medium      PT Goals        progressing    Visit Information  Last PT Received On: 03/09/12 Assistance Needed: +1    Subjective Data   "I'm going home today"   Cognition    good   Balance   good  End of Session PT - End of Session Equipment Utilized During Treatment: Gait belt Activity Tolerance: Patient tolerated treatment well Patient left: in chair;with call bell/phone within reach Nurse Communication: Mobility status   Felecia Shelling  PTA [facility]  Acute  Rehab Pager     [phone]

## 2012-03-09 NOTE — Discharge Summary (Signed)
Physician Discharge Summary  Keith Bell ZOX:[id number] DOB: Feb 18, 1951 DOA: 03/05/2012  PCP: Eino Farber, MD  Admit date: 03/05/2012 Discharge date: 03/09/2012  Recommendations for Outpatient Follow-up:  1. Chest x-ray  Discharge Diagnoses:  Active Problems:  COPD (chronic obstructive pulmonary disease)  HTN (hypertension)  Hyperlipidemia  Hypotension  Anemia  CKD (chronic kidney disease), stage III  Metabolic acidosis   1. Shortness of breath  Discharge Condition: Stable  Diet recommendation: Regular  History of present illness:  61 yo male with copd, hodgkins lymphoma has c/o sob, wheezing and cough (clear sputum) over the past 2 days. Pt was more sob and therefore presented to Ed. Pt notes sharp intermittent pain since during his last admission. Resolves with pain medications. Slight diarrhea, "loose stool". 1 bm yesterday. Had slight brbpr, yesterday. Just on toilet paper when he wiped. Denies fever, chills, palp, n/v, black stool.  In ED cxr =>pneumonia improved. Pt was hypotensive which improved with fluids. Pt was also noted to be tachycardic. Note was tachycardic on recent admission with negative VQ scan. Pt will be admitted for sob. Likely combination of copd and anemia.    Hospital Course:   1. Shortness of breath: Shortness of breath is probably multifactorial secondary to his recent necrotizing pneumonia and his hypotension plus or minus his anemia. After patient admitted to the hospital patient he was placed on the step down unit for overnight, was placed on broad-spectrum antibiotic which was wound decreased to Levaquin. Recent recommendation from pulmonology was to discharge patient on Augmentin for about 4 weeks, but patient mentioned that she have a rash and he was not sure of that from the antibiotics or before that so that was switched back to Levaquin. He will have Levaquin 750 mg one pill every other day total for Levaquin should be 3 weeks.  2.  Metabolic acidosis: Patient was presented with metabolic acidosis with bicarbonate of 11, his gap is 15. His persistent metabolic acidosis without any respiratory symptoms is likely secondary to his Afinitor, this medication reported to cause low serum bicarbonate. Patient also have CKD and it might contribute to that.  3. CKD stage III: Patient creatinine is stable at 1.5 at the time of discharge.  4. Hodgkin's lymphoma: Patient has stage IV B. Hodgkin's lymphoma, he follows with Dr. Arline Asp, patient is on , this was on hold while in the hospital and restarted the time of discharge.  5. Peripheral vascular disease with claudication: Stable, patient is on aspirin secondary to aspirin allergy.  6. Questionable diabetes mellitus type 2: Patient did have recent hemoglobin A1c of 6.9, according to American diabetic Association guidelines this is consistent with type 2 diabetes mellitus. Patient was on high dose of steroids for some time now, and is being tapered down to 20 mg every other day of prednisone. I did not start him on oral hypoglycemic. I asked him to do diabetic diet, followup with his primary care physician to recheck hemoglobin A1c as outpatient. He also will need TSH to be checked.  7. Recent history of necrotizing pneumonia: Patient should have Augmentin for 4 weeks, at the time of admission to the hospital the patient mentioned that he might have rash he was not sure about it so Augmentin was switched to Levaquin 750 mg every other day until patient sees [place] [facility] pulmonology on the 30th.  Procedures:  None  Consultations:  [place] pulmonology  Discharge Exam: Filed Vitals:   03/09/12 0544  BP: 144/74  Pulse: 66  Temp:  98.1 F (36.7 C)  Resp: 16   Filed Vitals:   03/08/12 0551 03/08/12 1430 03/08/12 2133 03/09/12 0544  BP: 133/77 137/90 115/67 144/74  Pulse: 80 84 83 66  Temp: 96.7 F (35.9 C) 97.7 F (36.5 C) 98 F (36.7 C) 98.1 F (36.7 C)  TempSrc: Axillary Oral  Oral Oral  Resp: 16 18 18 16   Height:      Weight: 56.5 kg (124 lb 9 oz)   57 kg (125 lb 10.6 oz)  SpO2: 100% 100% 99% 98%   General: Alert and awake, oriented x3, not in any acute distress. HEENT: anicteric sclera, pupils reactive to light and accommodation, EOMI CVS: S1-S2 clear, no murmur rubs or gallops Chest: clear to auscultation bilaterally, no wheezing, rales or rhonchi Abdomen: soft nontender, nondistended, normal bowel sounds, no organomegaly Extremities: no cyanosis, clubbing or edema noted bilaterally Neuro: Cranial nerves II-XII intact, no focal neurological deficits   Discharge Instructions  Discharge Orders    Future Appointments: Provider: Department: Dept Phone: Center:   03/13/2012 10:30 AM Julio Sicks, NP [facility] [phone] None   03/16/2012 11:00 AM [name], MD [facility] [phone] None     Future Orders Please Complete By Expires   Increase activity slowly        Medication List  As of 03/09/2012  4:59 PM   STOP taking these medications         amoxicillin-clavulanate 875-125 MG per tablet         TAKE these medications         albuterol 108 (90 BASE) MCG/ACT inhaler   Commonly known as: PROVENTIL HFA;VENTOLIN HFA   Inhale 2 puffs into the lungs every 4 (four) hours as needed. For shortness of breath.      everolimus 10 MG tablet   Commonly known as: AFINITOR   Take 10 mg by mouth every other day.      HYDROcodone-acetaminophen 5-500 MG per tablet   Commonly known as: VICODIN   Take 1 tablet by mouth every 6 (six) hours as needed. For pain.      levofloxacin 750 MG tablet   Commonly known as: LEVAQUIN   Take 1 tablet (750 mg total) by mouth every other day.      multivitamin with minerals Tabs   Take 1 tablet by mouth daily.      ondansetron 8 MG tablet   Commonly known as: ZOFRAN   Take 8 mg by mouth every 8 (eight) hours as needed. For nausea      pantoprazole 40 MG tablet   Commonly known as: PROTONIX   Take  1 tablet (40 mg total) by mouth daily.      potassium chloride SA 20 MEQ tablet   Commonly known as: K-DUR,KLOR-CON   Take 20 mEq by mouth daily.      predniSONE 20 MG tablet   Commonly known as: DELTASONE   Take 20 mg by mouth every other day.           Follow-up Information    Follow up with [facility] JR,GEORGE R, MD in 1 week.   Contact information:   Individual [address] [place] [address] [phone]           The results of significant diagnostics from this hospitalization (including imaging, microbiology, ancillary and laboratory) are listed below for reference.    Significant Diagnostic Studies: Dg Chest 2 View  02/27/2012  *RADIOLOGY REPORT*  Clinical Data: Cough,  congestion.  CHEST - 2 VIEW  Comparison: 02/22/2012  Findings: Right Port-A-Cath remains in place, unchanged.  Dense consolidation in the right upper lobe is stable.  Left lung is clear.  Heart is normal size.  No effusions.  IMPRESSION: Stable dense right upper lobe consolidation.  Original Report Authenticated By: Cyndie Chime, M.D.   Dg Chest 2 View  02/22/2012  *RADIOLOGY REPORT*  Clinical Data: Short of breath.  Weakness and dizziness. Productive cough.  CHEST - 2 VIEW  Comparison: 02/07/2012 and multiple previous  Findings: Power port remains in place with its tip in the SVC 4 cm above the right atrium.  There is been further worsening of consolidation and volume loss in the right upper lobe.  The remainder of the lungs are clear.  No effusions.  No bony abnormalities.  Impression:  Further worsening of consolidation and volume loss in the right upper lobe.  Original Report Authenticated By: Thomasenia Sales, M.D.   Ct Chest Wo Contrast  02/23/2012  *RADIOLOGY REPORT*  Clinical Data: Right lower lobe infiltrate.  Non Hodgkin lymphoma. Shortness of breath.  CT CHEST WITHOUT CONTRAST  Technique:  Multidetector CT imaging of the chest was performed following the standard protocol  without IV contrast.  Comparison: PET 12/21/2011. CT chest 12/18/2008.  Findings: No pathologically enlarged mediastinal or axillary lymph nodes.  The hilar regions are difficult to definitively evaluate without IV contrast.  Coronary artery calcification.  Heart size normal.  No pericardial effusion.  Emphysema.  Airspace consolidation with internal rounded lucencies involving the majority of the anterior and posterior segment of the right upper lobe.  Associated air bronchograms.  Additional consolidation is seen in the medial aspect of the right lower lobe, with at least one air-fluid level (image 30), possibly with a previous site of bullous disease, as seen on 12/18/2008. Additional scattered peribronchovascular nodularity and mild consolidation in the right lower lobe, inferiorly.  7 mm subpleural left upper lobe nodule (image 36) is unchanged from 12/18/2008, indicating a benign etiology.  No pleural fluid.  Airway is unremarkable.  Incidental imaging of the upper abdomen shows no acute findings. No worrisome lytic or sclerotic lesions.  IMPRESSION:  Right upper and right lower lobe pneumonia. Associated fluid in multiple bullous lesions in the right upper and right lower lobes.  Original Report Authenticated By: Reyes Ivan, M.D.   Nm Pulmonary Per & Vent  02/22/2012  *RADIOLOGY REPORT*  Clinical Data: Elevated D-dimer.  Abnormal chest radiograph.  NM PULMONARY VENTILATION AND PERFUSION SCAN  Radiopharmaceutical: CURIE MAA TECHNETIUM TO 36M ALBUMIN AGGREGATED  Comparison: Chest radiography same day  Findings: Perfusion of the left lung is normal.  There is diminished perfusion of the right upper lobe, matching the dense pulmonary infiltrate.  No defects seen in the right lower lobe.  IMPRESSION: Low probability of pulmonary embolism.  No defects in the lung that is normally aerated at radiography.  Matched defect in the right upper lobe to the region of dense pulmonary infiltrate.  Original  Report Authenticated By: Thomasenia Sales, M.D.   Dg Chest Port 1 View  02/07/2012  *RADIOLOGY REPORT*  Clinical Data: Cough, fever, history of gastric cancer  PORTABLE CHEST - 1 VIEW  Comparison: 01/17/2012; 04/29/2011; PET CT - 12/21/2011  Findings: Unchanged cardiac silhouette and mediastinal contours. Stable positioning of support apparatus.  Increased right upper lung heterogeneous air space opacities worrisome for infection. The left hemithorax is unchanged.  No definite pleural effusion.  A skin fold  overlies the peripheral aspect of the left upper lung. No definite pneumothorax.  Unchanged bones.  IMPRESSION: Worsening right upper lung pneumonia.  A follow-up chest radiograph in 4 to 6 weeks after treatment is recommended to ensure resolution.  Original Report Authenticated By: Waynard Reeds, M.D.   Dg Abd 2 Views  02/26/2012  *RADIOLOGY REPORT*  Clinical Data: Nausea and vomiting.  Hodgkin's lymphoma.  COPD.  ABDOMEN - 2 VIEW  Comparison: PET of 12/21/2011.  Findings: Upright view abdomen and a supine view of the abdomen and pelvis.  Upright view abdomen demonstrates no free intraperitoneal air or significant air fluid levels.  Interstitial thickening at the lung bases without lobar consolidation.  Supine view of the abdomen pelvis demonstrates a large colonic stool burden.  Distal gas.  No bowel distention.  Minimal convex right lumbar spine curvature.  IMPRESSION: Colonic stool burden suggests constipation.  No acute findings.  Original Report Authenticated By: Consuello Bossier, M.D.    Microbiology: Recent Results (from the past 240 hour(s))  TECHNOLOGIST REVIEW     Status: Normal   Collection Time   03/02/12  8:32 AM      Component Value Range Status Comment   Technologist Review     Final    Value: Metas and Myelocytes present, Few Helmets and Fragments  CULTURE, BLOOD (ROUTINE X 2)     Status: Normal (Preliminary result)   Collection Time   03/06/12  1:00 AM      Component Value Range  Status Comment   Specimen Description BLOOD R WRIST   Final    Special Requests     Final    Value: Immunocompromised BOTTLES DRAWN AEROBIC AND ANAEROBIC   Culture  Setup Time 03/06/2012 04:11   Final    Culture     Final    Value:        BLOOD CULTURE RECEIVED NO GROWTH TO DATE CULTURE WILL BE HELD FOR 5 DAYS BEFORE ISSUING A FINAL NEGATIVE REPORT   Report Status PENDING   Incomplete   CULTURE, BLOOD (ROUTINE X 2)     Status: Normal (Preliminary result)   Collection Time   03/06/12  1:01 AM      Component Value Range Status Comment   Specimen Description BLOOD PORTA CATH   Final    Special Requests     Final    Value: Immunocompromised BOTTLES DRAWN AEROBIC AND ANAEROBIC   Culture  Setup Time 03/06/2012 04:11   Final    Culture     Final    Value:        BLOOD CULTURE RECEIVED NO GROWTH TO DATE CULTURE WILL BE HELD FOR 5 DAYS BEFORE ISSUING A FINAL NEGATIVE REPORT   Report Status PENDING   Incomplete   MRSA PCR SCREENING     Status: Normal   Collection Time   03/06/12  1:49 AM      Component Value Range Status Comment   MRSA by PCR NEGATIVE  NEGATIVE Final      Labs: Basic Metabolic Panel:  Lab 03/09/12 6213 03/08/12 0423 03/07/12 0500 03/05/12 2100  NA 136 138 139 137  K 2.8* 3.5 3.8 3.0*  CL 111 114* 114* 111  CO2 12* 11* 12* 13*  GLUCOSE 143* 125* 117* 139*  BUN 27* 24* 23 32*  CREATININE 1.55* 1.39* 1.54* 1.91*  CALCIUM 8.1* 8.4 8.4 8.2*  MG -- -- 1.4* --  PHOS -- -- 2.8 --   Liver Function  Tests:  Lab 03/05/12 2100  AST 16  ALT 18  ALKPHOS 166*  BILITOT 0.3  PROT 5.5*  ALBUMIN 1.8*   No results found for this basename: LIPASE:5,AMYLASE:5 in the last 168 hours No results found for this basename: AMMONIA:5 in the last 168 hours CBC:  Lab 03/09/12 0350 03/08/12 0423 03/07/12 0500 03/05/12 2100  WBC 6.3 6.4 5.5 7.5  NEUTROABS -- -- -- --  HGB 9.2* 9.5* 11.1* 7.6*  HCT 26.4* 27.6* 32.7* 22.7*  MCV 85.7 85.7 84.9 84.4  PLT 168 156 143* 194    Cardiac Enzymes:  Lab 03/06/12 1802 03/06/12 1250 03/06/12 0620  CKTOTAL 14 15 9   CKMB 1.3 1.2 1.2  CKMBINDEX -- -- --  TROPONINI <0.30 <0.30 <0.30   BNP: BNP (last 3 results)  Basename 03/05/12 2100 02/22/12 1600  PROBNP 479.3* 1118.0*   CBG: No results found for this basename: GLUCAP:5 in the last 168 hours  Time coordinating discharge: 40 minutes.  Signed:  Talis Iwan A  [facility] 03/09/2012, 4:59 PM

## 2012-03-09 NOTE — Telephone Encounter (Signed)
Called pt in hosp room w/appt for 8/2 @ 11 am. Also s/w nurse Marissa on 4w @ [facility] re appt d/t.

## 2012-03-12 ENCOUNTER — Telehealth: Payer: Self-pay | Admitting: Oncology

## 2012-03-12 LAB — CULTURE, BLOOD (ROUTINE X 2): Culture: NO GROWTH

## 2012-03-12 NOTE — Telephone Encounter (Signed)
l/m with 8/2 lab    aom

## 2012-03-13 ENCOUNTER — Inpatient Hospital Stay: Payer: Medicare Other | Admitting: Adult Health

## 2012-03-14 ENCOUNTER — Encounter: Payer: Self-pay | Admitting: Medical Oncology

## 2012-03-14 ENCOUNTER — Other Ambulatory Visit: Payer: Self-pay | Admitting: Medical Oncology

## 2012-03-14 MED ORDER — TEMAZEPAM 30 MG PO CAPS: 30.0000 mg | ORAL_CAPSULE | Freq: Every evening | ORAL | Status: AC | PRN

## 2012-03-14 MED ORDER — HYDROCODONE-ACETAMINOPHEN 5-500 MG PO TABS: 1.0000 | ORAL_TABLET | Freq: Four times a day (QID) | ORAL | Status: DC | PRN

## 2012-03-14 NOTE — Progress Notes (Signed)
I called Patty back to let her know that Dr. Arline Asp is going to hold off on reordering prednisone for pt until he sees him Friday 03/16/12. She voiced understanding.

## 2012-03-14 NOTE — Progress Notes (Signed)
Patty-significant other called stating that pt is having trouble breathing. When he uses the inhaler it is better but when he lies down it is worse. He is not sleeping and just aches all over. He does not have anything to help him rest or for pain.He is drinking fluids but just has no appetite. She states he was taking prednisone but took his last pills before his last admission. I spoke with Dr. Arline Asp and he is not sure who prescribed the prednisone for pt. Per Dr. Nelda Bucks we can call in vicodin for pain and restoril for sleep. Dr. Arline Asp will see pt on Friday. If pt should get worse or breathing becomes worse he needs to go to the ER.

## 2012-03-16 ENCOUNTER — Ambulatory Visit (HOSPITAL_BASED_OUTPATIENT_CLINIC_OR_DEPARTMENT_OTHER): Payer: Medicare Other | Admitting: Oncology

## 2012-03-16 ENCOUNTER — Telehealth: Payer: Self-pay | Admitting: Oncology

## 2012-03-16 ENCOUNTER — Other Ambulatory Visit (HOSPITAL_BASED_OUTPATIENT_CLINIC_OR_DEPARTMENT_OTHER): Payer: Medicare Other | Admitting: Lab

## 2012-03-16 ENCOUNTER — Encounter: Payer: Self-pay | Admitting: Oncology

## 2012-03-16 VITALS — BP 104/69 | HR 132 | Temp 97.1°F | Resp 20 | Ht 66.0 in | Wt 114.8 lb

## 2012-03-16 DIAGNOSIS — C8190 Hodgkin lymphoma, unspecified, unspecified site: Secondary | ICD-10-CM

## 2012-03-16 LAB — TECHNOLOGIST REVIEW

## 2012-03-16 LAB — COMPREHENSIVE METABOLIC PANEL
Albumin: 2.7 g/dL — ABNORMAL LOW (ref 3.5–5.2)
BUN: 28 mg/dL — ABNORMAL HIGH (ref 6–23)
CO2: 13 mEq/L — ABNORMAL LOW (ref 19–32)
Calcium: 8.1 mg/dL — ABNORMAL LOW (ref 8.4–10.5)
Chloride: 109 mEq/L (ref 96–112)
Creatinine, Ser: 2.08 mg/dL — ABNORMAL HIGH (ref 0.50–1.35)
Glucose, Bld: 100 mg/dL — ABNORMAL HIGH (ref 70–99)
Potassium: 3.4 mEq/L — ABNORMAL LOW (ref 3.5–5.3)

## 2012-03-16 LAB — CBC WITH DIFFERENTIAL/PLATELET
Basophils Absolute: 0.1 10*3/uL (ref 0.0–0.1)
EOS%: 5.4 % (ref 0.0–7.0)
Eosinophils Absolute: 0.7 10*3/uL — ABNORMAL HIGH (ref 0.0–0.5)
HCT: 30.6 % — ABNORMAL LOW (ref 38.4–49.9)
HGB: 10 g/dL — ABNORMAL LOW (ref 13.0–17.1)
MCH: 29.9 pg (ref 27.2–33.4)
NEUT#: 11.4 10*3/uL — ABNORMAL HIGH (ref 1.5–6.5)
NEUT%: 82.2 % — ABNORMAL HIGH (ref 39.0–75.0)
RDW: 18 % — ABNORMAL HIGH (ref 11.0–14.6)
lymph#: 0.9 10*3/uL (ref 0.9–3.3)

## 2012-03-16 LAB — SEDIMENTATION RATE: Sed Rate: 136 mm/hr — ABNORMAL HIGH (ref 0–16)

## 2012-03-16 NOTE — Telephone Encounter (Signed)
Gave pt appt for labs for months of August 2013 , called Felicita Gage left message regarding MD visit spot on 04/12/12

## 2012-03-16 NOTE — Progress Notes (Signed)
CC:   [name], M.D. Oley Balm Simonds, MD  PROBLEM LIST:  1. Hodgkin's lymphoma, diagnosed in March 2009, stage IV with positive  bone marrow on 11/21/2007. Glenmore had a biopsy of a retroperitoneal  lymph node on 11/13/2007 to establish the diagnosis. At that time,  he had intra-abdominal disease and splenomegaly. He was having  night sweats and fevers so he was stage IVB. He received  Adriamycin, Velban, DTIC and Decadron approximately 4 and a half  cycles from 11/29/2007 through 04/03/2008 with only a partial  response. Bleomycin was omitted because of Naethan's underlying COPD.  He then received Rituxan with COPP for 6 cycles from 04/18/2008  through February 2010. He had an initial partial response and then  progressive disease. This was determined on 10/30/2008 by a PET-CT  scan. Rickey then received the RICE program for 6 cycles from  12/15/2008 through late August 2010 with an apparent complete  remission. He then had recurrence of disease noted and was treated  with Doxil, Navelbine and gemcitabine for 8 and a half cycles from  October 15, 2009 through April 21, 2010 with a partial response.  He then received brentuximab (Adcetris) from 05/25/2010 through  10/21/2010 with unfortunately progressive disease. He was then switched to the program of  bendamustine, Rituxan and Neulasta on 11/16/2010 and received 9 cycles.  Most recent course with that program was given on 07/18/2011. A PET  scan on 03/11/2011 showed a complete response. A bone marrow on  03/31/2011 was negative. Unfortunately on 08/02/2011 PET scan showed  relapse. At that point, Stacy had received a total of 6 different  chemotherapy regimens.  Emory was started on Afinitor 10 mg daily on September 06, 2011.  Approximately 2 weeks later on February 5th, the white count had dropped  to 1.9 with an ANC of 0.5 and a platelet count of a 104,000. Afinitor  was discontinued. Afinitor was restarted on 10/19/2011, however  by  03/20 the ANC was 0.8, and we once again held Afinitor. On April 3rd  Afinitor was started at 10 mg every other day, and so far, Keenen is  tolerating this well.  2. Chronic obstructive pulmonary disease.  3. Peripheral vascular disease with claudication of right leg.  4. Recurrent hypokalemia.  5. History of hypertension.  6. Dyslipidemia.  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  8. Weight loss.  9. Anemia felt to be most likely due to the anemia of the patient's  cancer and recent right upper lobe pneumonia.  10. Right upper lobe pneumonia diagnosed by chest x-ray on 01/17/2012. Diontae has had 3 admissions to the hospital for right upper lobe pneumonia.  His first hospitalization was from 02/07/2012 through 02/10/2012.  His second admission was from 02/22/2012 through 02/27/2012.  It was during that admission that the patient was seen in consultation by Dr. Billy Fischer and the diagnosis of a necrotizing polymicrobial pneumonia was made.  The patient was placed on Augmentin and Levaquin.  Recommendations were for prolonged treatment at least 4 weeks, perhaps longer.  The patient also required admission to the hospital from 07/22 through 07/26 for shortness of breath in association with a drop in his hemoglobin down to 7.6.  During that admission he received 2 units of packed red cells. 11. Regular tachycardia.  MEDICATIONS: 1. Albuterol inhaler 2 puffs every 4 hours as needed.  The patient is     also on handheld nebulizer with bronchodilator. 2. Augmentin 875/125 one tablet twice daily. 3.  Afinitor 10 mg every other day. 4. Vicodin 5/500 one every 6 hours as needed for pain. 5. Hycodan cough syrup 5 mL by mouth as needed every 4 hours. 6. Levaquin 750 mg every other day. 7. Multivitamins with minerals 1 a day. 8. Zofran 8 mg every 8 hours as needed for nausea. 9. Protonix 40 mg daily. 10.Potassium chloride 20 mEq daily. 11.Restoril 30 mg at bedtime.  HISTORY:   Niv is here today with a good friend, Alexia Freestone, who is now living with [name] and helping take care of him.  Brexton was last seen by [place] on 01/24/2012.  At that time he was diagnosed with a right upper lobe pneumonia.  This did not respond adequately to treatment and actually has gotten worse.  As noted above [name] required 3 admissions to the hospital and was diagnosed as having a necrotizing polymicrobial pneumonia involving his right upper lobe.  Diagnosis goes back to early June.  Diagnosis was established in mid July.  Edan is not really doing very well although he is more or less stable.  He denies any fever, chills, night sweats.  He is coughing up white phlegm.  He denies coughing up any sputum that looks like infection or necrotic lung.  He is having some discomfort under his right shoulder.  He is not eating very well.  He is on nutritional supplements but he only takes in 1 or 2 cans of Boost per day.  We talked about trying to increase his caloric intake.  Jermany is generally weak.  He has lost more weight now down to about 115 pounds representing about another 7 pound weight loss over the past 2 months.  Jayjay has dyspnea on exertion.  Despite that his O2 saturation here in the office is 100%.  He uses nebulizer treatments at home, about twice a day.  His skin is dry despite using moisturizing creams twice daily.  We talked about trying to increase that.  PHYSICAL EXAMINATION:  Gaudencio is in no acute distress but he looks chronically ill.  There is marked weight loss, now 114.8 pounds.  Height 5 feet 6 inches, body surface area 1.56 m2.  Blood pressure 104/69. Pulse when I examined Elroy was about 120 and regular but prior to that was up to about 132 and regular.  Respirations 20 and unlabored.  O2 saturation on room air at rest was 100%.  Temperature 97.1.  Skin is extremely dry.  No scleral icterus.  Mouth and pharynx are benign.  Stran is edentulous.  There is no peripheral adenopathy  palpable.  Lungs were clear.  Cardiac:  Regular rhythm without murmur or rub.  There is a right-sided Port-A-Cath that was flushed when Feliberto was in the hospital on 03/09/2012.  Abdomen was benign without masses, organomegaly, ascites, etc.  Extremities:  No peripheral edema or clubbing.  No evidence for hand-foot syndrome.  Neurologic exam was normal.  Kagen is using a cane for stability.  LABORATORY DATA:  Today, white count 13.9, ANC 11.4, hemoglobin 10.0, hematocrit 30.6, platelets are 101,000.  On 07/19 platelets were 163,000, and white count was 11.1 with an ANC of 7.6.  Chemistries from 01/24/2012 notable for a glucose of 133, alkaline phosphatase 211. Albumin was 2.9.  LDH was 138.  BUN was 17, creatinine 1.51.  On 03/09/2012 potassium was 2.8, BUN 27, creatinine 1.55, glucose 143.  On 03/05/2012 proBNP was 479.  On 03/02/2012 retic count was 1.44%.  On 03/05/2012 hemoglobin was 7.6.  After 2  units of packed red cells hemoglobin was 11.1 on 03/07/2012.    IMAGING STUDIES:  1. PET scan on 04/16/2010 showed persistent mesenteric and  retroperitoneal lymphadenopathy of the abdomen. A spleen lesion  seen on the prior study of 01/12/2010 also a PET scan was no  longer visualized and no new disease was seen.  2. PET scan from 07/19/2010 showed stable size of  retroperitoneal/mesenteric adenopathy with decreased metabolic  activity, and no evidence of new or progressive disease. This  study was compared with the previous study of 04/16/2010.  3. PET scan from 11/08/2010 showed lymphoma recurrence above and below  the diaphragm with new intense hypermetabolic adenopathy.  Hypermetabolic nodes include right supraclavicular, mediastinal and  subcarinal adenopathy. There was a new hypermetabolic pulmonary  nodule within the left lower lobe consistent with a pulmonary  metastasis. There was bulky periaortic hypermetabolic lymph nodes  which were increased in size and metabolic activity.  There was  concern for new splenic metastatic focus. There was a single  hypermetabolic right external iliac lymph node. There was also  hypermetabolism within the cecum without evidence of mass.  4. PET scan from 03/11/2011 showed interval complete metabolic  response to therapy of the previously identified widespread Hodgkin  lymphoma above and below the diaphragm with no evidence for  residual metabolically active lymphoma. The unenhanced CT scan  showed interval marked reduction in lymphadenopathy in the  mediastinum and the abdominal retroperitoneum.  5. PET scan from 08/02/2011 showed development of hypermetabolic nodes  within the chest, abdomen and pelvis consistent with  residual/recurrent disease. There was a probable low right  cervical hypermetabolic node. There was a right lung base  hypermetabolic nodule. The patient has underlying centrilobular  emphysema.  6. PET scan on 12/21/2011 showed minimal increase in the metabolic  activity associated with a right hilar lymph node and a left  periaortic lymph node. There was no evidence for any new  adenopathy. There were no hypermetabolic retroperitoneal lymph  nodes, or any abnormal activity within the spleen, pelvis or  inguinal lymph nodes. No focal hypermetabolic activity to suggest  skeletal metastasis.  7. Chest x-ray, 2 view, and right shoulder, 2 view, from  01/17/2012 showed an opacity in the right upper lobe  suspicious for pneumonia. There were mild degenerative changes at the  right [facility] joint. The Port-A-Cath tip remained in the mid superior vena  cava. No effusion was seen. Mediastinal contours appeared normal. 8. Chest x-ray, 2 view, from 02/22/2012 showed further worsening of     consolidation and volume loss in the right upper lobe. 9. CT scan of the chest without IV contrast on 02/23/2012 showed right     upper and right lower lobe pneumonia.  There was air space     consolidation with internal rounded  lucencies involving the     majority of the anterior and posterior segments of the right upper     lobe along with associated air bronchograms.  There was additional     consolidation seen in the medial aspect of the right lower lobe.     There was no pleural fluid.  Airways appeared unremarkable.     Incidental imaging of the upper abdomen showed no significant     findings. 10. Chest x-ray, 2 view, from 03/05/2012 showed persistent but slightly     improved right upper lobe pneumonia. 11. 2D echocardiogram from 03/06/2012 showed a left ventricular     ejection fraction of 65%-70% with normal wall motion.  Left     ventricular diastolic function parameters were normal. 12. Esophagram on 03/08/2012 was normal.  The patient ingested a 13 mm     barium tablet which easily passed through the esophagus and into     the stomach.  No reflux was identified.  There was normal motility     of the esophagus.    IMPRESSION AND PLAN:  Derick continues to be quite ill.  There has been a lot of weight loss.  He has tachycardia and shortness of breath.  He has had this persistent necrotizing presumed polymicrobial pneumonia involving the right upper lobe over the past 2 months.  That is currently under treatment with Augmentin and Levaquin.  Those drugs will be continued.  Louis apparently has an appointment to see one of the pulmonary specialists at [facility] in a couple weeks.  He is also apparently scheduled to have a chest x-ray at that time.  I have asked Sammuel and Alexia Freestone to bring in his medicines for future appointments so we can review them.  I have told Chanze that he needs to stay on the antibiotics until we discontinue them. For now he is still on the Afinitor 10 mg every other day in the hopes of controlling his Hodgkin's lymphoma.  The Afinitor however has some immunosuppressant properties and may be interfering with the treatment of his pneumonia.  We will have to give consideration to  stopping the Afinitor if the pneumonia does not seem to be responding satisfactorily.  We will continue to check weekly CBCs, specifically August 9, August 16 and August 23.  We will plan to see Binyomin in about 1 month, August 29, at which time we will check CBC, chemistries and a sedimentation rate.  He may need his Port-A-Cath flushed at that time.  Port-A-Cath was last flushed with heparin on 03/09/2012.  Once again, I spoke with [name] about living will and end of life issues. I think we agreed in principle that heroic measures, specifically resuscitation and ventilator support would be futile if his condition were to deteriorate.  I think in principle we have come to an understanding about do not resuscitate and no code blue.  If Adley's skin fails to respond to over-the-counter lubricating agents we may want to give consideration to Cetaphil in combination with triamcinolone 0.1% in a 1:1 mix in a 240 mL container.  Extensive hospital records were reviewed for this appointment.    ______________________________ [name], M.D. DSM/MEDQ  D:  03/16/2012  T:  03/16/2012  Job:  [id number]

## 2012-03-16 NOTE — Progress Notes (Signed)
This office note has been dictated.  #[id number]

## 2012-03-19 ENCOUNTER — Other Ambulatory Visit: Payer: Self-pay | Admitting: Medical Oncology

## 2012-03-19 DIAGNOSIS — C8190 Hodgkin lymphoma, unspecified, unspecified site: Secondary | ICD-10-CM

## 2012-03-19 LAB — LACTATE DEHYDROGENASE: LDH: 190 U/L (ref 94–250)

## 2012-03-23 ENCOUNTER — Emergency Department (HOSPITAL_COMMUNITY): Payer: Medicare Other

## 2012-03-23 ENCOUNTER — Inpatient Hospital Stay (HOSPITAL_COMMUNITY)
Admission: EM | Admit: 2012-03-23 | Discharge: 2012-04-05 | DRG: 177 | Disposition: A | Discharge status: Hospice | Payer: Medicare Other | Attending: Internal Medicine | Admitting: Internal Medicine

## 2012-03-23 ENCOUNTER — Encounter (HOSPITAL_COMMUNITY): Payer: Self-pay | Admitting: Emergency Medicine

## 2012-03-23 ENCOUNTER — Other Ambulatory Visit: Payer: Medicare Other | Admitting: Lab

## 2012-03-23 DIAGNOSIS — R6510 Systemic inflammatory response syndrome (SIRS) of non-infectious origin without acute organ dysfunction: Secondary | ICD-10-CM

## 2012-03-23 DIAGNOSIS — A419 Sepsis, unspecified organism: Secondary | ICD-10-CM | POA: Diagnosis present

## 2012-03-23 DIAGNOSIS — R627 Adult failure to thrive: Secondary | ICD-10-CM | POA: Diagnosis present

## 2012-03-23 DIAGNOSIS — D509 Iron deficiency anemia, unspecified: Secondary | ICD-10-CM | POA: Diagnosis present

## 2012-03-23 DIAGNOSIS — E785 Hyperlipidemia, unspecified: Secondary | ICD-10-CM | POA: Diagnosis present

## 2012-03-23 DIAGNOSIS — N179 Acute kidney failure, unspecified: Secondary | ICD-10-CM | POA: Diagnosis present

## 2012-03-23 DIAGNOSIS — E872 Acidosis, unspecified: Secondary | ICD-10-CM | POA: Diagnosis present

## 2012-03-23 DIAGNOSIS — C8190 Hodgkin lymphoma, unspecified, unspecified site: Secondary | ICD-10-CM | POA: Diagnosis present

## 2012-03-23 DIAGNOSIS — I10 Essential (primary) hypertension: Secondary | ICD-10-CM

## 2012-03-23 DIAGNOSIS — J4489 Other specified chronic obstructive pulmonary disease: Secondary | ICD-10-CM | POA: Diagnosis present

## 2012-03-23 DIAGNOSIS — R531 Weakness: Secondary | ICD-10-CM

## 2012-03-23 DIAGNOSIS — K5900 Constipation, unspecified: Secondary | ICD-10-CM | POA: Diagnosis present

## 2012-03-23 DIAGNOSIS — Z66 Do not resuscitate: Secondary | ICD-10-CM | POA: Diagnosis present

## 2012-03-23 DIAGNOSIS — R64 Cachexia: Secondary | ICD-10-CM | POA: Diagnosis present

## 2012-03-23 DIAGNOSIS — D6959 Other secondary thrombocytopenia: Secondary | ICD-10-CM | POA: Diagnosis present

## 2012-03-23 DIAGNOSIS — E870 Hyperosmolality and hypernatremia: Secondary | ICD-10-CM | POA: Diagnosis present

## 2012-03-23 DIAGNOSIS — I959 Hypotension, unspecified: Secondary | ICD-10-CM | POA: Diagnosis present

## 2012-03-23 DIAGNOSIS — J449 Chronic obstructive pulmonary disease, unspecified: Secondary | ICD-10-CM | POA: Diagnosis present

## 2012-03-23 DIAGNOSIS — J852 Abscess of lung without pneumonia: Principal | ICD-10-CM | POA: Diagnosis present

## 2012-03-23 DIAGNOSIS — E876 Hypokalemia: Secondary | ICD-10-CM | POA: Diagnosis present

## 2012-03-23 DIAGNOSIS — D649 Anemia, unspecified: Secondary | ICD-10-CM

## 2012-03-23 DIAGNOSIS — R5381 Other malaise: Secondary | ICD-10-CM | POA: Diagnosis present

## 2012-03-23 DIAGNOSIS — N183 Chronic kidney disease, stage 3 unspecified: Secondary | ICD-10-CM | POA: Diagnosis present

## 2012-03-23 DIAGNOSIS — J189 Pneumonia, unspecified organism: Secondary | ICD-10-CM | POA: Diagnosis present

## 2012-03-23 DIAGNOSIS — J850 Gangrene and necrosis of lung: Secondary | ICD-10-CM | POA: Diagnosis present

## 2012-03-23 DIAGNOSIS — D696 Thrombocytopenia, unspecified: Secondary | ICD-10-CM | POA: Diagnosis present

## 2012-03-23 HISTORY — DX: Iron deficiency anemia, unspecified: D50.9

## 2012-03-23 LAB — CBC
HCT: 23.9 % — ABNORMAL LOW (ref 39.0–52.0)
Hemoglobin: 8 g/dL — ABNORMAL LOW (ref 13.0–17.0)
MCH: 28.9 pg (ref 26.0–34.0)
MCHC: 33.5 g/dL (ref 30.0–36.0)

## 2012-03-23 LAB — EXPECTORATED SPUTUM ASSESSMENT W GRAM STAIN, RFLX TO RESP C

## 2012-03-23 LAB — URINALYSIS, ROUTINE W REFLEX MICROSCOPIC
Glucose, UA: 500 mg/dL — AB
Leukocytes, UA: NEGATIVE
Nitrite: NEGATIVE
Protein, ur: 100 mg/dL — AB
Urobilinogen, UA: 0.2 mg/dL (ref 0.0–1.0)

## 2012-03-23 LAB — BASIC METABOLIC PANEL
BUN: 51 mg/dL — ABNORMAL HIGH (ref 6–23)
GFR calc non Af Amer: 23 mL/min — ABNORMAL LOW (ref >90)
Glucose, Bld: 192 mg/dL — ABNORMAL HIGH (ref 70–99)
Potassium: 3.2 mEq/L — ABNORMAL LOW (ref 3.5–5.1)

## 2012-03-23 LAB — STREP PNEUMONIAE URINARY ANTIGEN: Strep Pneumo Urinary Antigen: NEGATIVE

## 2012-03-23 LAB — URINE MICROSCOPIC-ADD ON

## 2012-03-23 LAB — PROTIME-INR: INR: 1.16 (ref 0.00–1.49)

## 2012-03-23 MED ORDER — PIPERACILLIN-TAZOBACTAM 3.375 G IVPB
3.3750 g | Freq: Three times a day (TID) | INTRAVENOUS | Status: DC
  Administered 2012-03-23 – 2012-03-28 (×15): 3.375 g via INTRAVENOUS
  Filled 2012-03-23 (×17): qty 50

## 2012-03-23 MED ORDER — FAMOTIDINE 10 MG PO TABS
10.0000 mg | ORAL_TABLET | Freq: Two times a day (BID) | ORAL | Status: DC
  Administered 2012-03-23 (×2): 10 mg via ORAL
  Filled 2012-03-23 (×5): qty 1

## 2012-03-23 MED ORDER — POTASSIUM CHLORIDE CRYS ER 20 MEQ PO TBCR
20.0000 meq | EXTENDED_RELEASE_TABLET | Freq: Every day | ORAL | Status: DC
  Administered 2012-03-23 – 2012-04-05 (×13): 20 meq via ORAL
  Filled 2012-03-23 (×15): qty 1

## 2012-03-23 MED ORDER — SODIUM CHLORIDE 0.9 % IJ SOLN
3.0000 mL | Freq: Two times a day (BID) | INTRAMUSCULAR | Status: DC
  Administered 2012-03-23 – 2012-04-04 (×16): 3 mL via INTRAVENOUS
  Administered 2012-04-05: 10 mL via INTRAVENOUS

## 2012-03-23 MED ORDER — ALBUTEROL SULFATE (5 MG/ML) 0.5% IN NEBU
5.0000 mg | INHALATION_SOLUTION | Freq: Once | RESPIRATORY_TRACT | Status: AC
  Administered 2012-03-23: 5 mg via RESPIRATORY_TRACT
  Filled 2012-03-23: qty 1

## 2012-03-23 MED ORDER — SODIUM CHLORIDE 0.9 % IV BOLUS (SEPSIS)
1000.0000 mL | Freq: Once | INTRAVENOUS | Status: AC
  Administered 2012-03-23: 1000 mL via INTRAVENOUS

## 2012-03-23 MED ORDER — ADULT MULTIVITAMIN W/MINERALS CH
1.0000 | ORAL_TABLET | Freq: Every day | ORAL | Status: DC
  Administered 2012-03-23 – 2012-04-05 (×12): 1 via ORAL
  Filled 2012-03-23 (×14): qty 1

## 2012-03-23 MED ORDER — HYDROCODONE-HOMATROPINE 5-1.5 MG/5ML PO SYRP
5.0000 mL | ORAL_SOLUTION | Freq: Four times a day (QID) | ORAL | Status: DC | PRN
  Administered 2012-03-23 – 2012-04-05 (×10): 5 mL via ORAL
  Filled 2012-03-23 (×10): qty 5

## 2012-03-23 MED ORDER — SODIUM CHLORIDE 0.9 % IV SOLN
INTRAVENOUS | Status: DC
  Administered 2012-03-23: 17:00:00 via INTRAVENOUS

## 2012-03-23 MED ORDER — ASPIRIN 325 MG PO TABS
325.0000 mg | ORAL_TABLET | ORAL | Status: AC
  Administered 2012-03-23: 325 mg via ORAL
  Filled 2012-03-23: qty 1

## 2012-03-23 MED ORDER — HYDROCODONE-ACETAMINOPHEN 5-325 MG PO TABS
1.0000 | ORAL_TABLET | ORAL | Status: DC | PRN
  Administered 2012-03-26: 1 via ORAL
  Filled 2012-03-23: qty 1

## 2012-03-23 MED ORDER — ALBUTEROL SULFATE HFA 108 (90 BASE) MCG/ACT IN AERS
2.0000 | INHALATION_SPRAY | RESPIRATORY_TRACT | Status: DC | PRN
  Administered 2012-03-26 – 2012-03-27 (×2): 2 via RESPIRATORY_TRACT
  Filled 2012-03-23: qty 6.7

## 2012-03-23 MED ORDER — NITROGLYCERIN 0.4 MG SL SUBL: 0.4000 mg | SUBLINGUAL_TABLET | SUBLINGUAL | Status: DC | PRN

## 2012-03-23 MED ORDER — PANTOPRAZOLE SODIUM 40 MG PO TBEC
40.0000 mg | DELAYED_RELEASE_TABLET | Freq: Every day | ORAL | Status: DC
  Administered 2012-03-23: 40 mg via ORAL
  Filled 2012-03-23 (×2): qty 1

## 2012-03-23 MED ORDER — ONDANSETRON HCL 8 MG PO TABS
8.0000 mg | ORAL_TABLET | Freq: Three times a day (TID) | ORAL | Status: DC | PRN
  Administered 2012-03-23 – 2012-03-24 (×2): 8 mg via ORAL
  Filled 2012-03-23 (×2): qty 2

## 2012-03-23 MED ORDER — VANCOMYCIN HCL IN DEXTROSE 1-5 GM/200ML-% IV SOLN
1000.0000 mg | Freq: Once | INTRAVENOUS | Status: AC
  Administered 2012-03-23: 1000 mg via INTRAVENOUS
  Filled 2012-03-23: qty 200

## 2012-03-23 MED ORDER — VANCOMYCIN HCL 1000 MG IV SOLR
750.0000 mg | INTRAVENOUS | Status: DC
  Filled 2012-03-23: qty 750

## 2012-03-23 MED ORDER — PIPERACILLIN-TAZOBACTAM 3.375 G IVPB
3.3750 g | Freq: Once | INTRAVENOUS | Status: AC
  Administered 2012-03-23: 3.375 g via INTRAVENOUS
  Filled 2012-03-23: qty 50

## 2012-03-23 MED ORDER — EVEROLIMUS 10 MG PO TABS: 10.0000 mg | ORAL_TABLET | ORAL | Status: DC

## 2012-03-23 MED ORDER — TEMAZEPAM 15 MG PO CAPS
30.0000 mg | ORAL_CAPSULE | Freq: Every evening | ORAL | Status: DC | PRN
  Administered 2012-04-03: 30 mg via ORAL
  Filled 2012-03-23: qty 2

## 2012-03-23 NOTE — ED Notes (Signed)
Patient transported to X-ray 

## 2012-03-23 NOTE — Progress Notes (Signed)
Pharmacy - Brief Note  Pt meets hold criteria for home Afintor (everoliums) as set forth by [employer] (See Below). We have rejected the order. Medication will have to be ordered by Heme/Onc   Everolimus (Afinitor; Zortress) hold criteria  Hgb < 8  ANC < 1  Pltc < 50K  SCr > 1.5x baseline (or > 2 if baseline unknown)  Infection  Unexplained pneumonitis / hypoxemia  Gwen Her PharmD  [phone] 03/23/2012 2:40 PM

## 2012-03-23 NOTE — ED Provider Notes (Signed)
History     CSN: [id number]  Arrival date & time 03/23/12  7829   First MD Initiated Contact with Patient 03/23/12 [phone]      Chief Complaint  Patient presents with  . Cough  . Pleurisy    (Consider location/radiation/quality/duration/timing/severity/associated sxs/prior treatment) Patient is a 61 y.o. male presenting with cough. The history is provided by the patient.  Cough This is a new problem. The current episode started more than 2 days ago. The problem occurs constantly. The problem has not changed since onset.The cough is productive of sputum. There has been no fever. Associated symptoms include chest pain, chills, myalgias and shortness of breath. Associated symptoms comments: The patient complains of cough and chest pain with cough for the past three days. He reports recent hospitalization for pneumonia that feels the same. He is currently being treated with chemotherapy for lymphoma, followed by Dr. Arline Asp. He is experiencing shortness of breath. No vomiting.. His past medical history is significant for pneumonia.    Past Medical History  Diagnosis Date  . Dyslipidemia 2005  . Cancer     stomach  . Hypertension     No longer on BP pills x 4 years  . Hodgkin's lymphoma   . CKD (chronic kidney disease), stage III   . Anemia     Past Surgical History  Procedure Date  . No past surgeries     Family History  Problem Relation Age of Onset  . Heart disease Mother   . Heart disease Father   . Cancer Father     multiple myeloma  . Cancer Sister     breast cancer  . Cancer Sister     breast cancer    History  Substance Use Topics  . Smoking status: Former Smoker -- 1.0 packs/day for 30 years    Quit date: 01/31/2012  . Smokeless tobacco: Never Used  . Alcohol Use: No      Review of Systems  Constitutional: Positive for chills. Negative for fever.  Respiratory: Positive for cough and shortness of breath.   Cardiovascular: Positive for chest pain.    Gastrointestinal: Negative for vomiting and abdominal pain.  Genitourinary: Negative for dysuria.  Musculoskeletal: Positive for myalgias.    Allergies  Aspirin  Home Medications   Current Outpatient Rx  Name Route Sig Dispense Refill  . ALBUTEROL SULFATE HFA 108 (90 BASE) MCG/ACT IN AERS Inhalation Inhale 2 puffs into the lungs every 4 (four) hours as needed. For shortness of breath.    . AMOXICILLIN-POT CLAVULANATE 875-125 MG PO TABS Oral Take 1 tablet by mouth 2 (two) times daily. Therapy for 30 days. Pt's on day 20 of therapy    . EVEROLIMUS 10 MG PO TABS Oral Take 10 mg by mouth See admin instructions. Pt takes this every other day.    Marland Kitchen HYDROCODONE-ACETAMINOPHEN 5-500 MG PO TABS Oral Take 1 tablet by mouth every 6 (six) hours as needed. For pain. 30 tablet 1  . HYDROCODONE-HOMATROPINE 5-1.5 MG/5ML PO SYRP Oral Take 5 mLs by mouth every 6 (six) hours as needed. cough    . ADULT MULTIVITAMIN W/MINERALS CH Oral Take 1 tablet by mouth daily.    Marland Kitchen ONDANSETRON HCL 8 MG PO TABS Oral Take 8 mg by mouth every 8 (eight) hours as needed. For nausea    . PANTOPRAZOLE SODIUM 40 MG PO TBEC Oral Take 1 tablet (40 mg total) by mouth daily. 30 tablet 6  . POTASSIUM CHLORIDE CRYS ER 20 MEQ PO  TBCR Oral Take 20 mEq by mouth daily.    Marland Kitchen RANITIDINE HCL 150 MG PO TABS Oral Take 150 mg by mouth 2 (two) times daily as needed. Heartburn    . TEMAZEPAM 30 MG PO CAPS Oral Take 1 capsule (30 mg total) by mouth at bedtime as needed. 30 capsule 1    BP 107/78  Pulse 94  Temp 97.7 F (36.5 C) (Oral)  Resp 18  SpO2 100%  Physical Exam  Constitutional: He is oriented to person, place, and time. He appears well-developed. No distress.       Appears older than stated age, cachectic male.  HENT:  Head: Normocephalic.  Mouth/Throat: Mucous membranes are dry.  Neck: Normal range of motion. Neck supple.  Cardiovascular: Regular rhythm.  Tachycardia present.   No murmur heard. Pulmonary/Chest: Effort  normal. He has rales. He exhibits no tenderness.  Abdominal: Soft. Bowel sounds are normal. There is no tenderness. There is no rebound and no guarding.  Musculoskeletal: Normal range of motion. He exhibits no edema.  Neurological: He is alert and oriented to person, place, and time.  Skin: Skin is warm and dry. No rash noted.  Psychiatric: He has a normal mood and affect.    ED Course  Procedures (including critical care time) Results for orders placed during the hospital encounter of 03/23/12  CBC      Component Value Range   WBC 8.5  4.0 - 10.5 K/uL   RBC 2.77 (*) 4.22 - 5.81 MIL/uL   Hemoglobin 8.0 (*) 13.0 - 17.0 g/dL   HCT 16.1 (*) 09.6 - 04.5 %   MCV 86.3  78.0 - 100.0 fL   MCH 28.9  26.0 - 34.0 pg   MCHC 33.5  30.0 - 36.0 g/dL   RDW 40.9 (*) 81.1 - 91.4 %   Platelets 62 (*) 150 - 400 K/uL  BASIC METABOLIC PANEL      Component Value Range   Sodium 143  135 - 145 mEq/L   Potassium 3.2 (*) 3.5 - 5.1 mEq/L   Chloride 110  96 - 112 mEq/L   CO2 14 (*) 19 - 32 mEq/L   Glucose, Bld 192 (*) 70 - 99 mg/dL   BUN 51 (*) 6 - 23 mg/dL   Creatinine, Ser 7.82 (*) 0.50 - 1.35 mg/dL   Calcium 9.5  8.4 - 95.6 mg/dL   GFR calc non Af Amer 23 (*) >90 mL/min   GFR calc Af Amer 27 (*) >90 mL/min  TROPONIN I      Component Value Range   Troponin I <0.30  <0.30 ng/mL  PROTIME-INR      Component Value Range   Prothrombin Time 15.0  11.6 - 15.2 seconds   INR 1.16  0.00 - 1.49  LACTIC ACID, PLASMA      Component Value Range   Lactic Acid, Venous 2.1  0.5 - 2.2 mmol/L  PROCALCITONIN      Component Value Range   Procalcitonin 13.51    URINALYSIS, ROUTINE W REFLEX MICROSCOPIC      Component Value Range   Color, Urine YELLOW  YELLOW   APPearance CLEAR  CLEAR   Specific Gravity, Urine 1.021  1.005 - 1.030   pH 6.0  5.0 - 8.0   Glucose, UA 500 (*) NEGATIVE mg/dL   Hgb urine dipstick SMALL (*) NEGATIVE   Bilirubin Urine NEGATIVE  NEGATIVE   Ketones, ur NEGATIVE  NEGATIVE mg/dL    Protein, ur 213 (*) NEGATIVE mg/dL  Urobilinogen, UA 0.2  0.0 - 1.0 mg/dL   Nitrite NEGATIVE  NEGATIVE   Leukocytes, UA NEGATIVE  NEGATIVE  CULTURE, BLOOD (ROUTINE X 2)      Component Value Range   Specimen Description BLOOD LEFT HAND     Special Requests BOTTLES DRAWN AEROBIC AND ANAEROBIC 4CC     Culture  Setup Time 03/24/2012 01:00     Culture NO GROWTH 5 DAYS     Report Status 03/30/2012 FINAL    CULTURE, BLOOD (ROUTINE X 2)      Component Value Range   Specimen Description BLOOD PORTA CATH     Special Requests BOTTLES DRAWN AEROBIC AND ANAEROBIC 3CC     Culture  Setup Time 03/24/2012 01:00     Culture NO GROWTH 5 DAYS     Report Status 03/30/2012 FINAL    TYPE AND SCREEN      Component Value Range   ABO/RH(D) O POS     Antibody Screen NEG     Sample Expiration 03/26/2012     Unit Number [id number]     Blood Component Type RBC LR PHER1     Unit division 00     Status of Unit ISSUED,FINAL     Transfusion Status OK TO TRANSFUSE     Crossmatch Result Compatible     Unit Number [id number]     Blood Component Type RBC LR PHER2     Unit division 00     Status of Unit ISSUED,FINAL     Transfusion Status OK TO TRANSFUSE     Crossmatch Result Compatible     Unit Number [id number]     Blood Component Type RED CELLS,LR     Unit division 00     Status of Unit ISSUED,FINAL     Transfusion Status OK TO TRANSFUSE     Crossmatch Result Compatible    PREPARE RBC (CROSSMATCH)      Component Value Range   Order Confirmation ORDER PROCESSED BY BLOOD BANK    OCCULT BLOOD X 1 CARD TO LAB, STOOL      Component Value Range   Fecal Occult Bld NEGATIVE    CULTURE, EXPECTORATED SPUTUM-ASSESSMENT      Component Value Range   Specimen Description SPUTUM     Special Requests NONE     Sputum evaluation       Value: THIS SPECIMEN IS ACCEPTABLE. RESPIRATORY CULTURE REPORT TO FOLLOW.   Report Status 03/23/2012 FINAL    MRSA PCR SCREENING      Component Value Range   MRSA by PCR NEGATIVE   NEGATIVE  URINE MICROSCOPIC-ADD ON      Component Value Range   Squamous Epithelial / LPF FEW (*) RARE   RBC / HPF 0-2  <3 RBC/hpf   Bacteria, UA FEW (*) RARE  LEGIONELLA ANTIGEN, URINE      Component Value Range   Specimen Description URINE, RANDOM     Special Requests NONE     Legionella Antigen, Urine Negative for Legionella pneumophilia serogroup 1     Report Status 03/24/2012 FINAL    STREP PNEUMONIAE URINARY ANTIGEN      Component Value Range   Strep Pneumo Urinary Antigen NEGATIVE  NEGATIVE  CULTURE, RESPIRATORY      Component Value Range   Specimen Description SPUTUM     Special Requests NONE     Gram Stain       Value: NO WBC SEEN     NO SQUAMOUS EPITHELIAL CELLS SEEN  NO ORGANISMS SEEN   Culture       Value: FEW FUNGUS (MOLD) ISOLATED, PROBABLE CONTAMINANT/COLONIZER (SAPROPHYTE). CONTACT MICROBIOLOGY IF FURTHER IDENTIFICATION REQUIRED [phone].   Report Status 03/26/2012 FINAL    CBC      Component Value Range   WBC 7.0  4.0 - 10.5 K/uL   RBC 2.83 (*) 4.22 - 5.81 MIL/uL   Hemoglobin 8.2 (*) 13.0 - 17.0 g/dL   HCT 30.8 (*) 65.7 - 84.6 %   MCV 84.1  78.0 - 100.0 fL   MCH 29.0  26.0 - 34.0 pg   MCHC 34.5  30.0 - 36.0 g/dL   RDW 96.2 (*) 95.2 - 84.1 %   Platelets 44 (*) 150 - 400 K/uL  BASIC METABOLIC PANEL      Component Value Range   Sodium 140  135 - 145 mEq/L   Potassium 3.7  3.5 - 5.1 mEq/L   Chloride 111  96 - 112 mEq/L   CO2 14 (*) 19 - 32 mEq/L   Glucose, Bld 100 (*) 70 - 99 mg/dL   BUN 45 (*) 6 - 23 mg/dL   Creatinine, Ser 3.24 (*) 0.50 - 1.35 mg/dL   Calcium 8.1 (*) 8.4 - 10.5 mg/dL   GFR calc non Af Amer 28 (*) >90 mL/min   GFR calc Af Amer 32 (*) >90 mL/min  PROCALCITONIN      Component Value Range   Procalcitonin 7.59    LACTIC ACID, PLASMA      Component Value Range   Lactic Acid, Venous 0.7  0.5 - 2.2 mmol/L  LACTATE DEHYDROGENASE      Component Value Range   LDH 125  94 - 250 U/L  HAPTOGLOBIN      Component Value Range    Haptoglobin 409 (*) 45 - 215 mg/dL  SAVE SMEAR      Component Value Range   Smear Review SMEAR STAINED AND AVAILABLE FOR REVIEW    SODIUM, URINE, RANDOM      Component Value Range   Sodium, Ur 46    CREATININE, URINE, RANDOM      Component Value Range   Creatinine, Urine 12.9    PRO B NATRIURETIC PEPTIDE      Component Value Range   Pro B Natriuretic peptide (BNP) 1538.0 (*) 0 - 125 pg/mL  DIC (DISSEMINATED INTRAVASCULAR COAGULATION) PANEL      Component Value Range   Prothrombin Time 15.9 (*) 11.6 - 15.2 seconds   INR 1.24  0.00 - 1.49   aPTT 53 (*) 24 - 37 seconds   Fibrinogen >800 (*) 204 - 475 mg/dL   D-Dimer, Quant 4.01 (*) 0.00 - 0.48 ug/mL-FEU   Platelets 47 (*) 150 - 400 K/uL   Smear Review NO SCHISTOCYTES SEEN    CARDIAC PANEL(CRET KIN+CKTOT+MB+TROPI)      Component Value Range   Total CK 22  7 - 232 U/L   CK, MB 1.7  0.3 - 4.0 ng/mL   Troponin I <0.30  <0.30 ng/mL   Relative Index RELATIVE INDEX IS INVALID  0.0 - 2.5  CARDIAC PANEL(CRET KIN+CKTOT+MB+TROPI)      Component Value Range   Total CK 20  7 - 232 U/L   CK, MB 1.5  0.3 - 4.0 ng/mL   Troponin I <0.30  <0.30 ng/mL   Relative Index RELATIVE INDEX IS INVALID  0.0 - 2.5  CARDIAC PANEL(CRET KIN+CKTOT+MB+TROPI)      Component Value Range   Total CK 24  7 -  232 U/L   CK, MB 1.7  0.3 - 4.0 ng/mL   Troponin I <0.30  <0.30 ng/mL   Relative Index RELATIVE INDEX IS INVALID  0.0 - 2.5  CBC WITH DIFFERENTIAL      Component Value Range   WBC 9.7  4.0 - 10.5 K/uL   RBC 2.59 (*) 4.22 - 5.81 MIL/uL   Hemoglobin 7.6 (*) 13.0 - 17.0 g/dL   HCT 16.1 (*) 09.6 - 04.5 %   MCV 83.0  78.0 - 100.0 fL   MCH 29.3  26.0 - 34.0 pg   MCHC 35.3  30.0 - 36.0 g/dL   RDW 40.9 (*) 81.1 - 91.4 %   Platelets 40 (*) 150 - 400 K/uL   Neutrophils Relative 82 (*) 43 - 77 %   Lymphocytes Relative 5 (*) 12 - 46 %   Monocytes Relative 6  3 - 12 %   Eosinophils Relative 6 (*) 0 - 5 %   Basophils Relative 1  0 - 1 %   Neutro Abs 7.9 (*) 1.7  - 7.7 K/uL   Lymphs Abs 0.5 (*) 0.7 - 4.0 K/uL   Monocytes Absolute 0.6  0.1 - 1.0 K/uL   Eosinophils Absolute 0.6  0.0 - 0.7 K/uL   Basophils Absolute 0.1  0.0 - 0.1 K/uL   RBC Morphology TARGET CELLS     WBC Morphology MILD LEFT SHIFT (1-5% METAS, OCC MYELO, OCC BANDS)    BASIC METABOLIC PANEL      Component Value Range   Sodium 148 (*) 135 - 145 mEq/L   Potassium 2.8 (*) 3.5 - 5.1 mEq/L   Chloride 114 (*) 96 - 112 mEq/L   CO2 21  19 - 32 mEq/L   Glucose, Bld 179 (*) 70 - 99 mg/dL   BUN 31 (*) 6 - 23 mg/dL   Creatinine, Ser 7.82 (*) 0.50 - 1.35 mg/dL   Calcium 7.5 (*) 8.4 - 10.5 mg/dL   GFR calc non Af Amer 31 (*) >90 mL/min   GFR calc Af Amer 36 (*) >90 mL/min  MAGNESIUM      Component Value Range   Magnesium 1.2 (*) 1.5 - 2.5 mg/dL  PROCALCITONIN      Component Value Range   Procalcitonin 6.20    VITAMIN B12      Component Value Range   Vitamin B-12 >2000 (*) 211 - 911 pg/mL  FOLATE      Component Value Range   Folate 5.7    IRON AND TIBC      Component Value Range   Iron 10 (*) 42 - 135 ug/dL   TIBC 78 (*) 956 - 213 ug/dL   Saturation Ratios 13 (*) 20 - 55 %   UIBC 68 (*) 125 - 400 ug/dL  FERRITIN      Component Value Range   Ferritin 3967 (*) 22 - 322 ng/mL  PREPARE RBC (CROSSMATCH)      Component Value Range   Order Confirmation ORDER PROCESSED BY BLOOD BANK    CBC      Component Value Range   WBC 10.1  4.0 - 10.5 K/uL   RBC 3.82 (*) 4.22 - 5.81 MIL/uL   Hemoglobin 10.8 (*) 13.0 - 17.0 g/dL   HCT 08.6 (*) 57.8 - 46.9 %   MCV 80.9  78.0 - 100.0 fL   MCH 28.3  26.0 - 34.0 pg   MCHC 35.0  30.0 - 36.0 g/dL   RDW 62.9 (*) 52.8 - 41.3 %  Platelets 43 (*) 150 - 400 K/uL  BASIC METABOLIC PANEL      Component Value Range   Sodium 145  135 - 145 mEq/L   Potassium 3.4 (*) 3.5 - 5.1 mEq/L   Chloride 111  96 - 112 mEq/L   CO2 19  19 - 32 mEq/L   Glucose, Bld 131 (*) 70 - 99 mg/dL   BUN 28 (*) 6 - 23 mg/dL   Creatinine, Ser 1.30 (*) 0.50 - 1.35 mg/dL   Calcium  7.7 (*) 8.4 - 10.5 mg/dL   GFR calc non Af Amer 33 (*) >90 mL/min   GFR calc Af Amer 38 (*) >90 mL/min  PROCALCITONIN      Component Value Range   Procalcitonin 5.61    SEDIMENTATION RATE      Component Value Range   Sed Rate 118 (*) 0 - 16 mm/hr  CBC WITH DIFFERENTIAL      Component Value Range   WBC 8.0  4.0 - 10.5 K/uL   RBC 3.42 (*) 4.22 - 5.81 MIL/uL   Hemoglobin 9.7 (*) 13.0 - 17.0 g/dL   HCT 86.5 (*) 78.4 - 69.6 %   MCV 81.0  78.0 - 100.0 fL   MCH 28.4  26.0 - 34.0 pg   MCHC 35.0  30.0 - 36.0 g/dL   RDW 29.5 (*) 28.4 - 13.2 %   Platelets 40 (*) 150 - 400 K/uL   Neutrophils Relative 83 (*) 43 - 77 %   Lymphocytes Relative 6 (*) 12 - 46 %   Monocytes Relative 5  3 - 12 %   Eosinophils Relative 6 (*) 0 - 5 %   Basophils Relative 0  0 - 1 %   Neutro Abs 6.6  1.7 - 7.7 K/uL   Lymphs Abs 0.5 (*) 0.7 - 4.0 K/uL   Monocytes Absolute 0.4  0.1 - 1.0 K/uL   Eosinophils Absolute 0.5  0.0 - 0.7 K/uL   Basophils Absolute 0.0  0.0 - 0.1 K/uL   RBC Morphology TARGET CELLS     WBC Morphology MILD LEFT SHIFT (1-5% METAS, OCC MYELO, OCC BANDS)     Smear Review PLATELET COUNT CONFIRMED BY SMEAR    BASIC METABOLIC PANEL      Component Value Range   Sodium 144  135 - 145 mEq/L   Potassium 3.4 (*) 3.5 - 5.1 mEq/L   Chloride 112  96 - 112 mEq/L   CO2 19  19 - 32 mEq/L   Glucose, Bld 154 (*) 70 - 99 mg/dL   BUN 27 (*) 6 - 23 mg/dL   Creatinine, Ser 4.40 (*) 0.50 - 1.35 mg/dL   Calcium 7.8 (*) 8.4 - 10.5 mg/dL   GFR calc non Af Amer 30 (*) >90 mL/min   GFR calc Af Amer 35 (*) >90 mL/min  MAGNESIUM      Component Value Range   Magnesium 1.9  1.5 - 2.5 mg/dL  BASIC METABOLIC PANEL      Component Value Range   Sodium 139  135 - 145 mEq/L   Potassium 3.8  3.5 - 5.1 mEq/L   Chloride 108  96 - 112 mEq/L   CO2 17 (*) 19 - 32 mEq/L   Glucose, Bld 122 (*) 70 - 99 mg/dL   BUN 27 (*) 6 - 23 mg/dL   Creatinine, Ser 1.02 (*) 0.50 - 1.35 mg/dL   Calcium 8.1 (*) 8.4 - 10.5 mg/dL   GFR calc  non Af Amer 30 (*) >  90 mL/min   GFR calc Af Amer 34 (*) >90 mL/min  CBC      Component Value Range   WBC 8.8  4.0 - 10.5 K/uL   RBC 3.77 (*) 4.22 - 5.81 MIL/uL   Hemoglobin 10.5 (*) 13.0 - 17.0 g/dL   HCT 21.3 (*) 08.6 - 57.8 %   MCV 80.6  78.0 - 100.0 fL   MCH 27.9  26.0 - 34.0 pg   MCHC 34.5  30.0 - 36.0 g/dL   RDW 46.9 (*) 62.9 - 52.8 %   Platelets 31 (*) 150 - 400 K/uL  MAGNESIUM      Component Value Range   Magnesium 1.6  1.5 - 2.5 mg/dL  BASIC METABOLIC PANEL      Component Value Range   Sodium 144  135 - 145 mEq/L   Potassium 3.6  3.5 - 5.1 mEq/L   Chloride 113 (*) 96 - 112 mEq/L   CO2 17 (*) 19 - 32 mEq/L   Glucose, Bld 77  70 - 99 mg/dL   BUN 29 (*) 6 - 23 mg/dL   Creatinine, Ser 4.13 (*) 0.50 - 1.35 mg/dL   Calcium 8.2 (*) 8.4 - 10.5 mg/dL   GFR calc non Af Amer 29 (*) >90 mL/min   GFR calc Af Amer 33 (*) >90 mL/min  CBC WITH DIFFERENTIAL      Component Value Range   WBC 11.1 (*) 4.0 - 10.5 K/uL   RBC 3.57 (*) 4.22 - 5.81 MIL/uL   Hemoglobin 10.2 (*) 13.0 - 17.0 g/dL   HCT 24.4 (*) 01.0 - 27.2 %   MCV 81.2  78.0 - 100.0 fL   MCH 28.6  26.0 - 34.0 pg   MCHC 35.2  30.0 - 36.0 g/dL   RDW 53.6 (*) 64.4 - 03.4 %   Platelets 22 (*) 150 - 400 K/uL   Neutrophils Relative 89 (*) 43 - 77 %   Lymphocytes Relative 3 (*) 12 - 46 %   Monocytes Relative 4  3 - 12 %   Eosinophils Relative 4  0 - 5 %   Basophils Relative 0  0 - 1 %   Neutro Abs 10.0 (*) 1.7 - 7.7 K/uL   Lymphs Abs 0.3 (*) 0.7 - 4.0 K/uL   Monocytes Absolute 0.4  0.1 - 1.0 K/uL   Eosinophils Absolute 0.4  0.0 - 0.7 K/uL   Basophils Absolute 0.0  0.0 - 0.1 K/uL   RBC Morphology TARGET CELLS     WBC Morphology MILD LEFT SHIFT (1-5% METAS, OCC MYELO, OCC BANDS)     Smear Review PLATELET COUNT CONFIRMED BY SMEAR    MAGNESIUM      Component Value Range   Magnesium 2.2  1.5 - 2.5 mg/dL  BASIC METABOLIC PANEL      Component Value Range   Sodium 144  135 - 145 mEq/L   Potassium 3.6  3.5 - 5.1 mEq/L    Chloride 112  96 - 112 mEq/L   CO2 18 (*) 19 - 32 mEq/L   Glucose, Bld 121 (*) 70 - 99 mg/dL   BUN 34 (*) 6 - 23 mg/dL   Creatinine, Ser 7.42 (*) 0.50 - 1.35 mg/dL   Calcium 8.2 (*) 8.4 - 10.5 mg/dL   GFR calc non Af Amer 26 (*) >90 mL/min   GFR calc Af Amer 30 (*) >90 mL/min  CBC WITH DIFFERENTIAL      Component Value Range   WBC 12.0 (*)  4.0 - 10.5 K/uL   RBC 3.55 (*) 4.22 - 5.81 MIL/uL   Hemoglobin 10.1 (*) 13.0 - 17.0 g/dL   HCT 96.0 (*) 45.4 - 09.8 %   MCV 80.6  78.0 - 100.0 fL   MCH 28.5  26.0 - 34.0 pg   MCHC 35.3  30.0 - 36.0 g/dL   RDW 11.9 (*) 14.7 - 82.9 %   Platelets 20 (*) 150 - 400 K/uL   Neutrophils Relative 87 (*) 43 - 77 %   Lymphocytes Relative 4 (*) 12 - 46 %   Monocytes Relative 5  3 - 12 %   Eosinophils Relative 4  0 - 5 %   Basophils Relative 0  0 - 1 %   Neutro Abs 10.4 (*) 1.7 - 7.7 K/uL   Lymphs Abs 0.5 (*) 0.7 - 4.0 K/uL   Monocytes Absolute 0.6  0.1 - 1.0 K/uL   Eosinophils Absolute 0.5  0.0 - 0.7 K/uL   Basophils Absolute 0.0  0.0 - 0.1 K/uL   RBC Morphology BURR CELLS     WBC Morphology MILD LEFT SHIFT (1-5% METAS, OCC MYELO, OCC BANDS)    APTT      Component Value Range   aPTT 68 (*) 24 - 37 seconds  PROTIME-INR      Component Value Range   Prothrombin Time 16.5 (*) 11.6 - 15.2 seconds   INR 1.31  0.00 - 1.49  PREPARE PLATELET PHERESIS      Component Value Range   Unit Number [id number]     Blood Component Type PLTPHER LI2     Unit division 00     Status of Unit ISSUED,FINAL     Transfusion Status OK TO TRANSFUSE    URINALYSIS, ROUTINE W REFLEX MICROSCOPIC      Component Value Range   Color, Urine YELLOW  YELLOW   APPearance CLOUDY (*) CLEAR   Specific Gravity, Urine 1.017  1.005 - 1.030   pH 6.0  5.0 - 8.0   Glucose, UA 500 (*) NEGATIVE mg/dL   Hgb urine dipstick TRACE (*) NEGATIVE   Bilirubin Urine NEGATIVE  NEGATIVE   Ketones, ur NEGATIVE  NEGATIVE mg/dL   Protein, ur 962 (*) NEGATIVE mg/dL   Urobilinogen, UA 1.0  0.0 - 1.0  mg/dL   Nitrite NEGATIVE  NEGATIVE   Leukocytes, UA NEGATIVE  NEGATIVE  PROTEIN / CREATININE RATIO, URINE      Component Value Range   Creatinine, Urine 32.72     Total Protein, Urine 73.4     PROTEIN CREATININE RATIO 2.24 (*) 0.00 - 0.15  URINE MICROSCOPIC-ADD ON      Component Value Range   Bacteria, UA FEW (*) RARE   Casts GRANULAR CAST (*) NEGATIVE  CBC      Component Value Range   WBC 12.3 (*) 4.0 - 10.5 K/uL   RBC 3.51 (*) 4.22 - 5.81 MIL/uL   Hemoglobin 9.8 (*) 13.0 - 17.0 g/dL   HCT 95.2 (*) 84.1 - 32.4 %   MCV 81.2  78.0 - 100.0 fL   MCH 27.9  26.0 - 34.0 pg   MCHC 34.4  30.0 - 36.0 g/dL   RDW 40.1 (*) 02.7 - 25.3 %   Platelets 36 (*) 150 - 400 K/uL  BODY FLUID CELL COUNT WITH DIFFERENTIAL      Component Value Range   Fluid Type-FCT BRONCHIAL ALVEOLAR LAVAGE     Color, Fluid YELLOW  YELLOW   Appearance, Fluid TURBID (*) CLEAR  WBC, Fluid 2150 (*) 0 - 1000 cu mm   Neutrophil Count, Fluid 94 (*) 0 - 25 %   Lymphs, Fluid 0     Monocyte-Macrophage-Serous Fluid 5 (*) 50 - 90 %   Eos, Fluid 1    AFB CULTURE WITH SMEAR      Component Value Range   Specimen Description BRONCHIAL ALVEOLAR LAVAGE LEFT     Special Requests Immunocompromised     ACID FAST SMEAR NO ACID FAST BACILLI SEEN     Culture       Value: CULTURE WILL BE EXAMINED FOR 6 WEEKS BEFORE ISSUING A FINAL REPORT   Report Status PENDING    FUNGUS CULTURE W SMEAR      Component Value Range   Specimen Description BRONCHIAL ALVEOLAR LAVAGE LEFT     Special Requests Immunocompromised     Fungal Smear NO YEAST OR FUNGAL ELEMENTS SEEN     Culture CULTURE IN PROGRESS FOR FOUR WEEKS     Report Status PENDING    BODY FLUID CELL COUNT WITH DIFFERENTIAL      Component Value Range   Fluid Type-FCT BRONCHIAL ALVEOLAR LAVAGE     Color, Fluid BROWN (*) YELLOW   Appearance, Fluid TURBID (*) CLEAR   WBC, Fluid 3000 (*) 0 - 1000 cu mm   Neutrophil Count, Fluid 97 (*) 0 - 25 %   Lymphs, Fluid 0      Monocyte-Macrophage-Serous Fluid 2 (*) 50 - 90 %   Eos, Fluid 1    FUNGUS CULTURE W SMEAR      Component Value Range   Specimen Description BRONCHIAL ALVEOLAR LAVAGE RIGHT     Special Requests Immunocompromised     Fungal Smear RARE YEAST WITH PSEUDOHYPHAE     Culture CULTURE IN PROGRESS FOR FOUR WEEKS     Report Status PENDING    BLOOD GAS, ARTERIAL      Component Value Range   FIO2 1.00     Delivery systems BILEVEL POSITIVE AIRWAY PRESSURE     Mode BILEVEL POSITIVE AIRWAY PRESSURE     Inspiratory PAP 10.0     Expiratory PAP 5.0     pH, Arterial 7.317 (*) 7.350 - 7.450   pCO2 arterial 29.2 (*) 35.0 - 45.0 mmHg   pO2, Arterial 393.0 (*) 80.0 - 100.0 mmHg   Bicarbonate 14.5 (*) 20.0 - 24.0 mEq/L   TCO2 13.2  0 - 100 mmol/L   Acid-base deficit 10.1 (*) 0.0 - 2.0 mmol/L   O2 Saturation 99.2     Patient temperature 98.6     Collection site RIGHT RADIAL     Drawn by [id number]     Sample type ARTERIAL DRAW     Allens test (pass/fail) PASS  PASS  MRSA PCR SCREENING      Component Value Range   MRSA by PCR NEGATIVE  NEGATIVE  RENAL FUNCTION PANEL      Component Value Range   Sodium 145  135 - 145 mEq/L   Potassium 3.8  3.5 - 5.1 mEq/L   Chloride 113 (*) 96 - 112 mEq/L   CO2 16 (*) 19 - 32 mEq/L   Glucose, Bld 50 (*) 70 - 99 mg/dL   BUN 36 (*) 6 - 23 mg/dL   Creatinine, Ser 1.19 (*) 0.50 - 1.35 mg/dL   Calcium 8.0 (*) 8.4 - 10.5 mg/dL   Phosphorus 4.9 (*) 2.3 - 4.6 mg/dL   Albumin 1.3 (*) 3.5 - 5.2 g/dL   GFR calc non  Af Amer 23 (*) >90 mL/min   GFR calc Af Amer 26 (*) >90 mL/min  CBC WITH DIFFERENTIAL      Component Value Range   WBC 10.8 (*) 4.0 - 10.5 K/uL   RBC 3.45 (*) 4.22 - 5.81 MIL/uL   Hemoglobin 9.8 (*) 13.0 - 17.0 g/dL   HCT 28.4 (*) 13.2 - 44.0 %   MCV 82.3  78.0 - 100.0 fL   MCH 28.4  26.0 - 34.0 pg   MCHC 34.5  30.0 - 36.0 g/dL   RDW 10.2 (*) 72.5 - 36.6 %   Platelets 20 (*) 150 - 400 K/uL   Neutrophils Relative 88 (*) 43 - 77 %   Lymphocytes Relative 4 (*)  12 - 46 %   Monocytes Relative 4  3 - 12 %   Eosinophils Relative 4  0 - 5 %   Basophils Relative 0  0 - 1 %   Neutro Abs 9.6 (*) 1.7 - 7.7 K/uL   Lymphs Abs 0.4 (*) 0.7 - 4.0 K/uL   Monocytes Absolute 0.4  0.1 - 1.0 K/uL   Eosinophils Absolute 0.4  0.0 - 0.7 K/uL   Basophils Absolute 0.0  0.0 - 0.1 K/uL   RBC Morphology BURR CELLS     WBC Morphology TOXIC GRANULATION     Smear Review PLATELET COUNT CONFIRMED BY SMEAR    CULTURE, BAL-QUANTITATIVE      Component Value Range   Specimen Description BRONCHIAL ALVEOLAR LAVAGE LEFT     Special Requests NONE     Gram Stain PENDING     Colony Count 3,000 COLONIES/ML     Culture YEAST CONSISTENT WITH CANDIDA SPECIES     Report Status PENDING    CULTURE, BAL-QUANTITATIVE      Component Value Range   Specimen Description BRONCHIAL ALVEOLAR LAVAGE RIGHT     Special Requests NONE     Gram Stain       Value: FEW WBC PRESENT,BOTH PMN AND MONONUCLEAR     NO SQUAMOUS EPITHELIAL CELLS SEEN     NO ORGANISMS SEEN   Colony Count 20,OOO COLONIES/ML     Culture YEAST CONSISTENT WITH CANDIDA SPECIES     Report Status 03/31/2012 FINAL    PROTIME-INR      Component Value Range   Prothrombin Time 17.0 (*) 11.6 - 15.2 seconds   INR 1.36  0.00 - 1.49  APTT      Component Value Range   aPTT 73 (*) 24 - 37 seconds  CBC      Component Value Range   WBC 18.1 (*) 4.0 - 10.5 K/uL   RBC 3.99 (*) 4.22 - 5.81 MIL/uL   Hemoglobin 11.3 (*) 13.0 - 17.0 g/dL   HCT 44.0 (*) 34.7 - 42.5 %   MCV 82.0  78.0 - 100.0 fL   MCH 28.3  26.0 - 34.0 pg   MCHC 34.6  30.0 - 36.0 g/dL   RDW 95.6 (*) 38.7 - 56.4 %   Platelets 16 (*) 150 - 400 K/uL  DIC (DISSEMINATED INTRAVASCULAR COAGULATION) PANEL      Component Value Range   Prothrombin Time 14.9  11.6 - 15.2 seconds   INR 1.15  0.00 - 1.49   aPTT 69 (*) 24 - 37 seconds   Fibrinogen >800 (*) 204 - 475 mg/dL   D-Dimer, Quant 3.32 (*) 0.00 - 0.48 ug/mL-FEU   Platelets 16 (*) 150 - 400 K/uL   Smear Review NO  SCHISTOCYTES SEEN  COMPREHENSIVE METABOLIC PANEL      Component Value Range   Sodium 145  135 - 145 mEq/L   Potassium 4.2  3.5 - 5.1 mEq/L   Chloride 113 (*) 96 - 112 mEq/L   CO2 17 (*) 19 - 32 mEq/L   Glucose, Bld 188 (*) 70 - 99 mg/dL   BUN 46 (*) 6 - 23 mg/dL   Creatinine, Ser 5.78 (*) 0.50 - 1.35 mg/dL   Calcium 8.5  8.4 - 46.9 mg/dL   Total Protein 4.9 (*) 6.0 - 8.3 g/dL   Albumin 1.3 (*) 3.5 - 5.2 g/dL   AST 8  0 - 37 U/L   ALT 5  0 - 53 U/L   Alkaline Phosphatase 330 (*) 39 - 117 U/L   Total Bilirubin 0.2 (*) 0.3 - 1.2 mg/dL   GFR calc non Af Amer 21 (*) >90 mL/min   GFR calc Af Amer 24 (*) >90 mL/min  LACTATE DEHYDROGENASE      Component Value Range   LDH 199  94 - 250 U/L  PHOSPHORUS      Component Value Range   Phosphorus 5.1 (*) 2.3 - 4.6 mg/dL  PREPARE PLATELET PHERESIS      Component Value Range   Unit Number [id number]     Blood Component Type PLTPHER LR3     Unit division 00     Status of Unit ISSUED     Transfusion Status OK TO TRANSFUSE    CBC      Component Value Range   WBC 19.8 (*) 4.0 - 10.5 K/uL   RBC 3.93 (*) 4.22 - 5.81 MIL/uL   Hemoglobin 11.2 (*) 13.0 - 17.0 g/dL   HCT 10.2 (*) 72.5 - 36.6 %   MCV 82.4  78.0 - 100.0 fL   MCH 28.5  26.0 - 34.0 pg   MCHC 34.6  30.0 - 36.0 g/dL   RDW 44.0 (*) 34.7 - 42.5 %   Platelets 80 (*) 150 - 400 K/uL    Labs Reviewed  CBC - Abnormal; Notable for the following:    RBC 2.77 (*)     Hemoglobin 8.0 (*)     HCT 23.9 (*)     RDW 18.2 (*)     Platelets 62 (*)     All other components within normal limits  BASIC METABOLIC PANEL - Abnormal; Notable for the following:    Potassium 3.2 (*)     CO2 14 (*)     Glucose, Bld 192 (*)     BUN 51 (*)     Creatinine, Ser 2.81 (*)     GFR calc non Af Amer 23 (*)     GFR calc Af Amer 27 (*)     All other components within normal limits  TROPONIN I  PROTIME-INR  LACTIC ACID, PLASMA  PROCALCITONIN  TYPE AND SCREEN  PREPARE RBC (CROSSMATCH)  URINALYSIS,  ROUTINE W REFLEX MICROSCOPIC  CULTURE, BLOOD (ROUTINE X 2)  CULTURE, BLOOD (ROUTINE X 2)   Dg Chest 2 View  03/23/2012  *RADIOLOGY REPORT*  Clinical Data: Cough.  Pleurisy.  Short of breath.  History of cancer.  CHEST - 2 VIEW  Comparison: 03/05/2012.  Findings: Progressive airspace disease is present in the right upper lobe, best seen on the lateral view.  When comparing lateral views to prior chest radiograph 03/05/2012, there is continued/progressive consolidation of the right upper lobe.  Support apparatus appears unchanged.  There is some faint airspace disease in  the left midlung as well.  Cardiopericardial silhouette unchanged.  Right IJ power port appears similar.  IMPRESSION: Progressive consolidation of the right upper lobe, best seen on the lateral view.  Faint airspace opacity in the left midlung, which may also represent pneumonia.  Original Report Authenticated By: Andreas Newport, M.D.     No diagnosis found.  1. Pneumonia 2. Anemia 3. Lymphoma   MDM  Records reviewed. Patient discharged from previous hospitalization on Augmentin and Levaquin, which he states he is taking. Positive CXR for PNA supports failure of outpatient treatment. Hemoglobin has also dropped 2 grams since 8/2. Blood transfusion ordered. Patient's vital signs remain stable, he drops to 89% when taken off O2 by nasal cannula. Admitted to [facility], stepdown.         Rodena Medin, PA-C 03/31/12 2229

## 2012-03-23 NOTE — ED Notes (Signed)
Per EMS, pt with cough x 3 days and Chest wall pain x 3days. Pt was given 4 Baby ASA per [facility] prior to EMS transfer.

## 2012-03-23 NOTE — ED Notes (Signed)
ZOX:[id number]<AV> Expected date:03/23/12<BR> Expected time: 9:07 AM<BR> Means of arrival:Ambulance<BR> Comments:<BR> Ca pt-fever/cough

## 2012-03-23 NOTE — ED Notes (Signed)
Patient aware of need for urine specimen. Patient unable to void at this time. Patient given urinal. Encouraged to call for assistance if needed.   

## 2012-03-23 NOTE — H&P (Signed)
[facility] History and Physical  Keith Bell BJY:[id number] DOB: [birth year] DOA: 03/23/2012  Referring physician: Gray Bell PCP: Keith Farber, MD   Chief Complaint:   HPI:  Very pleasant 61 yo with hx copd, hodgkins lymphoma, necrotizing pna presents to ED cc sob and cough. Information provided by painted.  States symptoms began 3 days ago have persisted and worsened. Associated symptoms include chills, subjective fever, pleuritic chest pain and sinus congestion as well as some nausea and 3 episodes of vomiting. Reports emesis clear without dk coffee ground substances. Denies dysuria/hematuria abdominal pain or diarrhea. In ED pt was tachycardic, hypotensive and chest xray concerning for pna. Of note pt was discharged from hospital 7/26 after 5 day stay for similar symptoms. Was discharge on levaquin to be taken for 3 week. [facility] asked to admit    Review of Systems:  All systems reviewed and are neg except as stated in HPI  Past Medical History  Diagnosis Date  . Dyslipidemia 2005  . Cancer     stomach  . Hypertension     No longer on BP pills x 4 years  . Hodgkin's lymphoma   . CKD (chronic kidney disease), stage III   . Anemia    Past Surgical History  Procedure Date  . No past surgeries    Social History:  reports that he quit smoking about 7 weeks ago. He has never used smokeless tobacco. He reports that he does not drink alcohol or use illicit drugs. Lives alone but has significant other that helps with ADL Allergies  Allergen Reactions  . Aspirin Nausea Only    Family History  Problem Relation Age of Onset  . Heart disease Mother   . Heart disease Father   . Cancer Father     multiple myeloma  . Cancer Sister     breast cancer  . Cancer Sister     breast cancer     Prior to Admission medications   Medication Sig Start Date End Date Taking? Authorizing Provider  albuterol (PROVENTIL HFA;VENTOLIN HFA) 108 (90 BASE) MCG/ACT inhaler Inhale 2  puffs into the lungs every 4 (four) hours as needed. For shortness of breath.   Yes Historical Provider, MD  amoxicillin-clavulanate (AUGMENTIN) 875-125 MG per tablet Take 1 tablet by mouth 2 (two) times daily. Therapy for 30 days. Pt's on day 20 of therapy   Yes Historical Provider, MD  everolimus (AFINITOR) 10 MG tablet Take 10 mg by mouth See admin instructions. Pt takes this every other day.   Yes Historical Provider, MD  HYDROcodone-acetaminophen (VICODIN) 5-500 MG per tablet Take 1 tablet by mouth every 6 (six) hours as needed. For pain. 03/14/12  Yes Keith Fraction Murinson, MD  HYDROcodone-homatropine [facility]) 5-1.5 MG/5ML syrup Take 5 mLs by mouth every 6 (six) hours as needed. cough   Yes Historical Provider, MD  Multiple Vitamin (MULTIVITAMIN WITH MINERALS) TABS Take 1 tablet by mouth daily.   Yes Historical Provider, MD  ondansetron (ZOFRAN) 8 MG tablet Take 8 mg by mouth every 8 (eight) hours as needed. For nausea 02/10/12  Yes Keith Murray, MD  pantoprazole (PROTONIX) 40 MG tablet Take 1 tablet (40 mg total) by mouth daily. 09/01/11 08/31/12 Yes Keith Fraction Murinson, MD  potassium chloride SA (K-DUR,KLOR-CON) 20 MEQ tablet Take 20 mEq by mouth daily.   Yes Historical Provider, MD  ranitidine (ZANTAC) 150 MG tablet Take 150 mg by mouth 2 (two) times daily as needed. Heartburn   Yes Historical  Provider, MD  temazepam (RESTORIL) 30 MG capsule Take 1 capsule (30 mg total) by mouth at bedtime as needed. 03/14/12  Yes [name], MD   Physical Exam: Filed Vitals:   03/23/12 1203 03/23/12 1212 03/23/12 1215 03/23/12 1242  BP:  107/78  110/82  Pulse:  94 96   Temp:  97.7 F (36.5 C)    TempSrc:  Oral    Resp:   15   SpO2: 97% 100% 100%      General:  Awake alert frail NAD  Eyes: PERRL EOMI  ENT: No nasal drainage, mucus membranes mouth dry/slightly pale ears clear  Neck: supple full ROM  Cardiovascular: RRR No MGR No Lee PPP  Respiratory: Normal effort. BS distant. Mild exp  wheeze, no rales rhochi  Abdomen: soft + BS non-tender to palpation  Skin: warm/dry No lesion/rash  Musculoskeletal: MOE No joint swelling/erythema Full ROM  Psychiatric: appropriate mood/affect, cooperative  Neurologic: alert, oriented cranial nerve II-XII intact  Labs on Admission:  Basic Metabolic Panel:  Lab 03/23/12 0347  NA 143  K 3.2*  CL 110  CO2 14*  GLUCOSE 192*  BUN 51*  CREATININE 2.81*  CALCIUM 9.5  MG --  PHOS --   Liver Function Tests: No results found for this basename: AST:5,ALT:5,ALKPHOS:5,BILITOT:5,PROT:5,ALBUMIN:5 in the last 168 hours No results found for this basename: LIPASE:5,AMYLASE:5 in the last 168 hours No results found for this basename: AMMONIA:5 in the last 168 hours CBC:  Lab 03/23/12 0955  WBC 8.5  NEUTROABS --  HGB 8.0*  HCT 23.9*  MCV 86.3  PLT 62*   Cardiac Enzymes:  Lab 03/23/12 0955  CKTOTAL --  CKMB --  CKMBINDEX --  TROPONINI <0.30    BNP (last 3 results)  Basename 03/05/12 2100 02/22/12 1600  PROBNP 479.3* 1118.0*   CBG: No results found for this basename: GLUCAP:5 in the last 168 hours  Radiological Exams on Admission: Dg Chest 2 View  03/23/2012  *RADIOLOGY REPORT*  Clinical Data: Cough.  Pleurisy.  Short of breath.  History of cancer.  CHEST - 2 VIEW  Comparison: 03/05/2012.  Findings: Progressive airspace disease is present in the right upper lobe, best seen on the lateral view.  When comparing lateral views to prior chest radiograph 03/05/2012, there is continued/progressive consolidation of the right upper lobe.  Support apparatus appears unchanged.  There is some faint airspace disease in the left midlung as well.  Cardiopericardial silhouette unchanged.  Right IJ power port appears similar.  IMPRESSION: Progressive consolidation of the right upper lobe, best seen on the lateral view.  Faint airspace opacity in the left midlung, which may also represent pneumonia.  Original Report Authenticated By: Andreas Newport, M.D.    EKG: Independently reviewed.   Assessment/Plan Principal Problem:  SIRS (systemic inflammatory response syndrome) likely related to progressive consolidation right upper lobe per xray and hx necrotizing pna. Will admit to SD. Lactic acid 2.1. Procalcitonin 13.  Will support with IV fluids, 02, IV antibiotics. Will check urine, blood sputum cultures. Pt already responding positively to treatment in ED with SBP range 96-110. HR 95-109.  Active Problems:  HCAP: See #1. Of note, at discharge pt to complete 3 weeks levaquin as he could not tolerate augmentin that pulm recommended. Will continue IV antibiotics.   Anemia: likely related to chronic disease as well as treatment for Hodgins lymphoma. No s/sx active bleeding. Will transfuse 1 unit PRBC. Serial CBC   Acute on chronic kidney disease III. Creatinine 2.8.  Chart review indicates baseline 1.3-1.5. Likely related to #1. Will hold any nephrotoxins. Will hydrate with IV fluids. Will recheck in am.   Metabolic acidosis: most likely combination of #1, #3 and medication Afinitor. Chart review indicates Serum CO2 range 12-15. Will hold off on ABG for now as   COPD (chronic obstructive pulmonary disease) stable at baseline. Continue home meds     Code Status:  Family Communication: pt at bedside Disposition Plan: home when medically stable  Time spent: 40 minutes  [facility]   If 7PM-7AM, please contact night-coverage [email or web address] Password [facility] 03/23/2012, 12:54 PM   Patient seen and examined and agree with plan as outlined by my associate above.  We will place on broad spectrum antibiotic and would cover for HAP given recent hospitalization (d/c'd 03/09/12) and observe in stepdown overnight.  Pending clinical improvement will consider transferring to floor next am.  Patient recently diagnosed with Necrotizing pneumonia and was supposed to f/u with [facility] pulmonology on 03/13/12. Will f/u with blood  culture and sputum culture (orders already placed.) Order urine legionella/strep antigen  Patient also has history of Hodgkin's lymphoma stage IV B reportedly.  He is followed by Dr. Arline Asp.  Will have patient f/u with Dr. Arline Asp once ready to transition out of the hospital for further recommendations from an oncologist standpoint.  Metabolic acidosis agree that it is likely secondary to Afinitor given reported side effect of decrease in serum bicarbonate.  Keith Bell, [employer]

## 2012-03-23 NOTE — Progress Notes (Signed)
ANTIBIOTIC CONSULT NOTE - INITIAL  Pharmacy Consult for vancomycin/Zosyn Indication: HAP  Allergies  Allergen Reactions  . Aspirin Nausea Only    Patient Measurements: Height: 5' 6.14" (168 cm) Weight: 114 lb 10.2 oz (52 kg) IBW/kg (Calculated) : 64.13    Vital Signs: Temp: 97.7 F (36.5 C) (08/09 1212) Temp src: Oral (08/09 1212) BP: 107/72 mmHg (08/09 1430) Pulse Rate: 91  (08/09 1430) Intake/Output from previous day:   Intake/Output from this shift: Total I/O In: 420 [P.O.:420] Out: -   Labs:  Basename 03/23/12 0955  WBC 8.5  HGB 8.0*  PLT 62*  LABCREA --  CREATININE 2.81*   Estimated Creatinine Clearance: 20.6 ml/min (by C-G formula based on Cr of 2.81). No results found for this basename: VANCOTROUGH:2,VANCOPEAK:2,VANCORANDOM:2,GENTTROUGH:2,GENTPEAK:2,GENTRANDOM:2,TOBRATROUGH:2,TOBRAPEAK:2,TOBRARND:2,AMIKACINPEAK:2,AMIKACINTROU:2,AMIKACIN:2, in the last 72 hours   Microbiology: Recent Results (from the past 720 hour(s))  CULTURE, BLOOD (ROUTINE X 2)     Status: Normal   Collection Time   02/22/12 10:40 PM      Component Value Range Status Comment   Specimen Description BLOOD RIGHT ANTECUBITAL   Final    Special Requests BOTTLES DRAWN AEROBIC AND ANAEROBIC 2CC   Final    Culture  Setup Time 02/23/2012 05:04   Final    Culture NO GROWTH 5 DAYS   Final    Report Status 02/29/2012 FINAL   Final   MRSA PCR SCREENING     Status: Normal   Collection Time   02/22/12 10:54 PM      Component Value Range Status Comment   MRSA by PCR NEGATIVE  NEGATIVE Final   CULTURE, BLOOD (ROUTINE X 2)     Status: Normal   Collection Time   02/22/12 11:00 PM      Component Value Range Status Comment   Specimen Description BLOOD RIGHT ARM   Final    Special Requests BOTTLES DRAWN AEROBIC AND ANAEROBIC 5CC   Final    Culture  Setup Time 02/23/2012 05:04   Final    Culture NO GROWTH 5 DAYS   Final    Report Status 02/29/2012 FINAL   Final   CULTURE, EXPECTORATED  SPUTUM-ASSESSMENT     Status: Normal   Collection Time   02/23/12 10:46 AM      Component Value Range Status Comment   Specimen Description SPUTUM   Final    Special Requests Immunocompromised   Final    Sputum evaluation     Final    Value: THIS SPECIMEN IS ACCEPTABLE. RESPIRATORY CULTURE REPORT TO FOLLOW.   Report Status 02/23/2012 FINAL   Final   CULTURE, RESPIRATORY     Status: Normal   Collection Time   02/23/12 10:46 AM      Component Value Range Status Comment   Specimen Description SPUTUM   Final    Special Requests NONE   Final    Gram Stain     Final    Value: NO WBC SEEN     FEW SQUAMOUS EPITHELIAL CELLS PRESENT     FEW GRAM POSITIVE COCCI IN PAIRS     FEW GRAM POSITIVE RODS   Culture NORMAL OROPHARYNGEAL FLORA   Final    Report Status 02/25/2012 FINAL   Final   TECHNOLOGIST REVIEW     Status: Normal   Collection Time   03/02/12  8:32 AM      Component Value Range Status Comment   Technologist Review     Final    Value: Metas and Myelocytes present, Few  Helmets and Fragments  CULTURE, BLOOD (ROUTINE X 2)     Status: Normal   Collection Time   03/06/12  1:00 AM      Component Value Range Status Comment   Specimen Description BLOOD R WRIST   Final    Special Requests     Final    Value: Immunocompromised BOTTLES DRAWN AEROBIC AND ANAEROBIC   Culture  Setup Time 03/06/2012 04:11   Final    Culture NO GROWTH 5 DAYS   Final    Report Status 03/12/2012 FINAL   Final   CULTURE, BLOOD (ROUTINE X 2)     Status: Normal   Collection Time   03/06/12  1:01 AM      Component Value Range Status Comment   Specimen Description BLOOD PORTA CATH   Final    Special Requests     Final    Value: Immunocompromised BOTTLES DRAWN AEROBIC AND ANAEROBIC   Culture  Setup Time 03/06/2012 04:11   Final    Culture NO GROWTH 5 DAYS   Final    Report Status 03/12/2012 FINAL   Final   MRSA PCR SCREENING     Status: Normal   Collection Time   03/06/12  1:49 AM      Component Value Range  Status Comment   MRSA by PCR NEGATIVE  NEGATIVE Final   TECHNOLOGIST REVIEW     Status: Normal   Collection Time   03/16/12 10:55 AM      Component Value Range Status Comment   Technologist Review Few Metas, Rouleaux Present   Final     Medical History: Past Medical History  Diagnosis Date  . Dyslipidemia 2005  . Cancer     stomach  . Hypertension     No longer on BP pills x 4 years  . Hodgkin's lymphoma   . CKD (chronic kidney disease), stage III   . Anemia     Medications:  Scheduled:    . albuterol  5 mg Nebulization Once  . aspirin  325 mg Oral STAT  . famotidine  10 mg Oral BID  . multivitamin with minerals  1 tablet Oral Daily  . pantoprazole  40 mg Oral Daily  . piperacillin-tazobactam (ZOSYN)  IV  3.375 g Intravenous Once  . potassium chloride SA  20 mEq Oral Daily  . sodium chloride  1,000 mL Intravenous Once  . sodium chloride  3 mL Intravenous Q12H  . vancomycin  1,000 mg Intravenous Once  . DISCONTD: everolimus  10 mg Oral See admin instructions   Infusions:    . sodium chloride     Assessment: 61 yo male to be admitted with HAP. Recently discharted on 7/26 after 5 day stay for similar symptoms. To be started on Vancomycin and Zosyn per pharmacy dosing  Goal of Therapy:  Vancomycin trough level 15-20 mcg/ml  Plan:  1. Vancomycin 1g IV x 1 given in ER at 1138. Due to impaired renal function, will start Vancomycin 750mg  IV q48 for now and adjust tomorrow if SCr improves 2. Start Zosyn 3.375g IV q8 (extended interval infusion) for now with CrCl at cutoff of 20 ml/min. Will also monitor SCr and adjust tomorrow if necessary   Hessie Knows, PharmD, BCPS Pager [phone] 03/23/2012 2:49 PM

## 2012-03-24 ENCOUNTER — Encounter (HOSPITAL_COMMUNITY): Payer: Self-pay

## 2012-03-24 ENCOUNTER — Inpatient Hospital Stay (HOSPITAL_COMMUNITY): Payer: Medicare Other

## 2012-03-24 DIAGNOSIS — I959 Hypotension, unspecified: Secondary | ICD-10-CM

## 2012-03-24 DIAGNOSIS — R066 Hiccough: Secondary | ICD-10-CM

## 2012-03-24 DIAGNOSIS — A419 Sepsis, unspecified organism: Secondary | ICD-10-CM | POA: Diagnosis present

## 2012-03-24 DIAGNOSIS — D696 Thrombocytopenia, unspecified: Secondary | ICD-10-CM | POA: Diagnosis present

## 2012-03-24 LAB — CREATININE, URINE, RANDOM: Creatinine, Urine: 12.9 mg/dL

## 2012-03-24 LAB — SODIUM, URINE, RANDOM: Sodium, Ur: 46 mEq/L

## 2012-03-24 LAB — SAVE SMEAR

## 2012-03-24 LAB — BASIC METABOLIC PANEL
BUN: 45 mg/dL — ABNORMAL HIGH (ref 6–23)
CO2: 14 mEq/L — ABNORMAL LOW (ref 19–32)
Calcium: 8.1 mg/dL — ABNORMAL LOW (ref 8.4–10.5)
Creatinine, Ser: 2.4 mg/dL — ABNORMAL HIGH (ref 0.50–1.35)

## 2012-03-24 LAB — PRO B NATRIURETIC PEPTIDE: Pro B Natriuretic peptide (BNP): 1538 pg/mL — ABNORMAL HIGH (ref 0–125)

## 2012-03-24 LAB — DIC (DISSEMINATED INTRAVASCULAR COAGULATION)PANEL
Prothrombin Time: 15.9 seconds — ABNORMAL HIGH (ref 11.6–15.2)
Smear Review: NONE SEEN

## 2012-03-24 LAB — CARDIAC PANEL(CRET KIN+CKTOT+MB+TROPI)
CK, MB: 1.7 ng/mL (ref 0.3–4.0)
Relative Index: INVALID (ref 0.0–2.5)
Total CK: 22 U/L (ref 7–232)
Troponin I: <0.3 ng/mL (ref <0.30)

## 2012-03-24 LAB — LEGIONELLA ANTIGEN, URINE: Legionella Antigen, Urine: NEGATIVE

## 2012-03-24 LAB — CBC
MCH: 29 pg (ref 26.0–34.0)
MCV: 84.1 fL (ref 78.0–100.0)
Platelets: 44 10*3/uL — ABNORMAL LOW (ref 150–400)
RBC: 2.83 MIL/uL — ABNORMAL LOW (ref 4.22–5.81)

## 2012-03-24 LAB — PROCALCITONIN: Procalcitonin: 7.59 ng/mL

## 2012-03-24 LAB — OCCULT BLOOD X 1 CARD TO LAB, STOOL: Fecal Occult Bld: NEGATIVE

## 2012-03-24 MED ORDER — STERILE WATER FOR INJECTION IV SOLN
INTRAVENOUS | Status: DC
  Administered 2012-03-24 (×2): via INTRAVENOUS
  Filled 2012-03-24 (×6): qty 9.7

## 2012-03-24 MED ORDER — FAMOTIDINE 20 MG PO TABS
20.0000 mg | ORAL_TABLET | Freq: Two times a day (BID) | ORAL | Status: DC
  Administered 2012-03-24 – 2012-03-25 (×4): 20 mg via ORAL
  Filled 2012-03-24 (×6): qty 1

## 2012-03-24 MED ORDER — GI COCKTAIL ~~LOC~~
30.0000 mL | Freq: Three times a day (TID) | ORAL | Status: DC | PRN
  Administered 2012-03-25: 30 mL via ORAL
  Filled 2012-03-24 (×2): qty 30

## 2012-03-24 MED ORDER — SODIUM CHLORIDE 0.9 % IV BOLUS (SEPSIS)
500.0000 mL | Freq: Once | INTRAVENOUS | Status: AC
  Administered 2012-03-24: 500 mL via INTRAVENOUS

## 2012-03-24 MED ORDER — SODIUM CHLORIDE 0.9 % IV BOLUS (SEPSIS)
1000.0000 mL | Freq: Once | INTRAVENOUS | Status: AC
  Administered 2012-03-24: 1000 mL via INTRAVENOUS

## 2012-03-24 MED ORDER — CHLORPROMAZINE HCL 25 MG PO TABS
25.0000 mg | ORAL_TABLET | Freq: Three times a day (TID) | ORAL | Status: DC | PRN
  Administered 2012-03-24 – 2012-03-25 (×2): 25 mg via ORAL
  Filled 2012-03-24 (×3): qty 1

## 2012-03-24 MED ORDER — METOPROLOL TARTRATE 1 MG/ML IV SOLN
2.5000 mg | Freq: Once | INTRAVENOUS | Status: AC
  Administered 2012-03-24: 2.5 mg via INTRAVENOUS

## 2012-03-24 MED ORDER — TECHNETIUM TO 99M ALBUMIN AGGREGATED
3.2000 | Freq: Once | INTRAVENOUS | Status: AC | PRN
  Administered 2012-03-24: 3.2 via INTRAVENOUS

## 2012-03-24 MED ORDER — DM-GUAIFENESIN ER 30-600 MG PO TB12
2.0000 | ORAL_TABLET | Freq: Two times a day (BID) | ORAL | Status: DC
  Administered 2012-03-24 – 2012-03-30 (×13): 2 via ORAL
  Filled 2012-03-24 (×15): qty 2

## 2012-03-24 MED ORDER — METOPROLOL TARTRATE 1 MG/ML IV SOLN: 5.0000 mg | Freq: Once | INTRAVENOUS | Status: DC

## 2012-03-24 MED ORDER — IPRATROPIUM BROMIDE 0.02 % IN SOLN
0.5000 mg | RESPIRATORY_TRACT | Status: DC | PRN
  Administered 2012-03-29: 0.5 mg via RESPIRATORY_TRACT
  Filled 2012-03-24 (×2): qty 2.5

## 2012-03-24 MED ORDER — METOPROLOL TARTRATE 1 MG/ML IV SOLN
INTRAVENOUS | Status: AC
  Filled 2012-03-24: qty 5

## 2012-03-24 MED ORDER — SODIUM CHLORIDE 0.9 % IV SOLN
500.0000 mL | Freq: Once | INTRAVENOUS | Status: AC
  Administered 2012-03-24: 500 mL via INTRAVENOUS

## 2012-03-24 NOTE — Progress Notes (Signed)
Pt. Back from VQ Scan - tolerated testing without difficulty.

## 2012-03-24 NOTE — Progress Notes (Signed)
[facility] PROGRESS NOTE  Keith Bell ZOX:[id number] DOB: 1950/10/28 DOA: 03/23/2012 PCP: Eino Farber, MD  Assessment/Plan: Principal Problem:  *Sepsis Active Problems:  Hodgkin's lymphoma in relapse  COPD (chronic obstructive pulmonary disease)  Hyperlipidemia  Hypotension  Acute on chronic renal failure  Anemia  CKD (chronic kidney disease), stage III  Metabolic acidosis  SIRS (systemic inflammatory response syndrome)  HCAP (healthcare-associated pneumonia)  Thrombocytopenia  #1 hypotension Likely secondary to sepsis secondary to recurrent probable necrotizing pneumonia/healthcare associated pneumonia. Patient with systolic blood pressure in the 80s overnight. Patient given some fluid boluses. Pro calcitonin was elevated at 13. Will give the bolus of 1 L normal saline x1 now and increase IV fluids 225 cc per hour. Repeat pro calcitonin levels and lactic acid levels. Sputum Gram stain and cultures pending. Blood cultures are pending. Urine Legionella is pending. Urine strep pneumococcus is negative. Will follow for now. Will consult with pulmonary and critical care medicine for further evaluation and management.  #2 recurrent necrotizing pneumonia/healthcare associated pneumonia This is patient's third hospitalization for right upper lobe pneumonia first episode in June of 2013 second episode in July of 2013 with patient was diagnosed with a necrotizing pneumonia per pulmonary medicine. Patient was discharged home on Levaquin for 3 weeks. Patient is presenting with productive cough, chills, fever, chest x-ray consistent with worsening pneumonia. Sputum Gram stain and cultures are pending. Urine strep pneumococcus is negative. A urine Legionella is pending. Pro calcitonin levels were elevated at 13 on admission. Lactic acid level was 2.1. Will repeat pro calcitonin and lactic acid levels. Repeat CT of the chest. Continue empiric IV Zosyn and vancomycin. Nebs as needed.  Mucinex. Will consult with pulmonary and critical care for further evaluation and management.  #3 sepsis Likely secondary to problem #2. Urinalysis is negative. Chest x-ray consistent with recurrent worsening pneumonia. Blood cultures are pending. Pro calcitonin level was elevated on admission at 13. Lactic acid level was 2.1. Urine strep pneumococcus is negative. Urine Legionella is pending. Repeat pro calcitonin levels and lactic acid levels. Continue empiric IV vancomycin and Zosyn. Consult with pulmonary and critical care medicine.  #4 acute on chronic kidney disease stage III Patient's baseline creatinine is approximately 1.4-1.7. Creatinine on admission was 2.8. Likely secondary to a prerenal azotemia. Creatinine improving with hydration and currently at 2.4. Patient was also noted to be hypotensive overnight. Will check a urine sodium and a urine creatinine. Will check a renal ultrasound. Continue supportive care follow.  #5 thrombocytopenia/Anemia Likely secondary to problem #3 and afinitor. Patient is status post 1 unit of packed red blood cells. Will discontinue afinitor. We'll check LDH. Check a haptoglobin. Peripheral smear. Will consult with hematology/oncology for further evaluation and management.  #6 COPD Stable. Nebs when necessary.  #7 metabolic acidosis Likely secondary to sepsis versus acute on chronic kidney disease stage III. Will add sodium bicarbonate to IV fluids and follow. See problem #3.  #8 Hodgkin's lymphoma, relapse We'll discontinue patient's afinitor secondary to his anemia, thrombocytopenia, sepsis. We'll consult with hematology oncology for further evaluation and management.  #9 prophylaxis Pepcid for GI prophylaxis. SCDs for DVT prophylaxis.   Code Status: FULL Family Communication: Updated patient and daughter at bedside Disposition Plan: Home when medically stable.   Brief narrative: Very pleasant 61 yo with hx copd, hodgkins lymphoma, necrotizing  pna presents to ED cc sob and cough. Information provided by painted. States symptoms began 3 days ago have persisted and worsened. Associated symptoms include chills, subjective fever, pleuritic  chest pain and sinus congestion as well as some nausea and 3 episodes of vomiting. Reports emesis clear without dk coffee ground substances. Denies dysuria/hematuria abdominal pain or diarrhea. In ED pt was tachycardic, hypotensive and chest xray concerning for pna. Of note pt was discharged from hospital 7/26 after 5 day stay for similar symptoms. Was discharge on levaquin to be taken for 3 week. [facility] asked to admit      Consultants:  Pulmonary pending   Hematolgy/Oncology pending  Procedures:  CXR  03/23/12  Status post 1 unit of packed red blood cells on 03/23/2012  Antibiotics:  IV Vancomycin 03/23/12  IV Zosyn 03/23/12  HPI/Subjective: Patient complaining of cough and pleuritic chest pain secondary to cough with productive sputum. Patient also complaining of hiccups. Per nursing patient with hypotension overnight with some response to IV fluids.  Objective: Filed Vitals:   03/24/12 0530 03/24/12 0600 03/24/12 0800 03/24/12 0830  BP: 92/47 83/50 82/71  103/53  Pulse: 72 86 100 95  Temp:   98.4 F (36.9 C)   TempSrc:   Oral   Resp: 21 22 16 22   Height:      Weight:      SpO2: 100% 100% 100% 99%    Intake/Output Summary (Last 24 hours) at 03/24/12 0910 Last data filed at 03/24/12 0830  Gross per 24 hour  Intake 5662.5 ml  Output    976 ml  Net 4686.5 ml    Exam: General: Alert, awake, oriented x3, in no acute distress. HEENT: No bruits, no goiter. Heart: Regular rate and rhythm, without murmurs, rubs, gallops. Lungs: Clear to auscultation bilaterally. Abdomen: Soft, nontender, nondistended, positive bowel sounds. Extremities: No clubbing cyanosis or edema with positive pedal pulses. Neuro: Grossly intact, nonfocal.  Data Reviewed: Basic Metabolic Panel:  Lab 03/24/12  0323 03/23/12 0955  NA 140 143  K 3.7 3.2*  CL 111 110  CO2 14* 14*  GLUCOSE 100* 192*  BUN 45* 51*  CREATININE 2.40* 2.81*  CALCIUM 8.1* 9.5  MG -- --  PHOS -- --   Liver Function Tests: No results found for this basename: AST:5,ALT:5,ALKPHOS:5,BILITOT:5,PROT:5,ALBUMIN:5 in the last 168 hours No results found for this basename: LIPASE:5,AMYLASE:5 in the last 168 hours No results found for this basename: AMMONIA:5 in the last 168 hours CBC:  Lab 03/24/12 0323 03/23/12 0955  WBC 7.0 8.5  NEUTROABS -- --  HGB 8.2* 8.0*  HCT 23.8* 23.9*  MCV 84.1 86.3  PLT 44* 62*   Cardiac Enzymes:  Lab 03/23/12 0955  CKTOTAL --  CKMB --  CKMBINDEX --  TROPONINI <0.30   BNP (last 3 results)  Basename 03/05/12 2100 02/22/12 1600  PROBNP 479.3* 1118.0*   CBG: No results found for this basename: GLUCAP:5 in the last 168 hours  Recent Results (from the past 240 hour(s))  TECHNOLOGIST REVIEW     Status: Normal   Collection Time   03/16/12 10:55 AM      Component Value Range Status Comment   Technologist Review Few Metas, Rouleaux Present   Final   MRSA PCR SCREENING     Status: Normal   Collection Time   03/23/12  2:37 PM      Component Value Range Status Comment   MRSA by PCR NEGATIVE  NEGATIVE Final   CULTURE, EXPECTORATED SPUTUM-ASSESSMENT     Status: Normal   Collection Time   03/23/12  3:33 PM      Component Value Range Status Comment   Specimen Description SPUTUM  Final    Special Requests NONE   Final    Sputum evaluation     Final    Value: THIS SPECIMEN IS ACCEPTABLE. RESPIRATORY CULTURE REPORT TO FOLLOW.   Report Status 03/23/2012 FINAL   Final   CULTURE, RESPIRATORY     Status: Normal (Preliminary result)   Collection Time   03/23/12  4:05 PM      Component Value Range Status Comment   Specimen Description SPUTUM   Final    Special Requests NONE   Final    Gram Stain     Final    Value: NO WBC SEEN     NO SQUAMOUS EPITHELIAL CELLS SEEN     NO ORGANISMS SEEN    Culture PENDING   Incomplete    Report Status PENDING   Incomplete      Studies: Dg Chest 2 View  03/23/2012  *RADIOLOGY REPORT*  Clinical Data: Cough.  Pleurisy.  Short of breath.  History of cancer.  CHEST - 2 VIEW  Comparison: 03/05/2012.  Findings: Progressive airspace disease is present in the right upper lobe, best seen on the lateral view.  When comparing lateral views to prior chest radiograph 03/05/2012, there is continued/progressive consolidation of the right upper lobe.  Support apparatus appears unchanged.  There is some faint airspace disease in the left midlung as well.  Cardiopericardial silhouette unchanged.  Right IJ power port appears similar.  IMPRESSION: Progressive consolidation of the right upper lobe, best seen on the lateral view.  Faint airspace opacity in the left midlung, which may also represent pneumonia.  Original Report Authenticated By: Andreas Newport, M.D.   Dg Chest 2 View  03/05/2012  *RADIOLOGY REPORT*  Clinical Data: Respiratory distress.  CHEST - 2 VIEW  Comparison: 02/27/2012.  Findings: The power port is stable.  The right upper lobe airspace process shows a slight decrease and consolidation.  The remainder the lungs are clear.  IMPRESSION: Persistent but slight improved right upper lobe pneumonia.  Original Report Authenticated By: P. Loralie Champagne, M.D.   Dg Chest 2 View  02/27/2012  *RADIOLOGY REPORT*  Clinical Data: Cough, congestion.  CHEST - 2 VIEW  Comparison: 02/22/2012  Findings: Right Port-A-Cath remains in place, unchanged.  Dense consolidation in the right upper lobe is stable.  Left lung is clear.  Heart is normal size.  No effusions.  IMPRESSION: Stable dense right upper lobe consolidation.  Original Report Authenticated By: Cyndie Chime, M.D.   Ct Chest Wo Contrast  02/23/2012  *RADIOLOGY REPORT*  Clinical Data: Right lower lobe infiltrate.  Non Hodgkin lymphoma. Shortness of breath.  CT CHEST WITHOUT CONTRAST  Technique:  Multidetector CT  imaging of the chest was performed following the standard protocol without IV contrast.  Comparison: PET 12/21/2011. CT chest 12/18/2008.  Findings: No pathologically enlarged mediastinal or axillary lymph nodes.  The hilar regions are difficult to definitively evaluate without IV contrast.  Coronary artery calcification.  Heart size normal.  No pericardial effusion.  Emphysema.  Airspace consolidation with internal rounded lucencies involving the majority of the anterior and posterior segment of the right upper lobe.  Associated air bronchograms.  Additional consolidation is seen in the medial aspect of the right lower lobe, with at least one air-fluid level (image 30), possibly with a previous site of bullous disease, as seen on 12/18/2008. Additional scattered peribronchovascular nodularity and mild consolidation in the right lower lobe, inferiorly.  7 mm subpleural left upper lobe nodule (image 36) is unchanged from 12/18/2008,  indicating a benign etiology.  No pleural fluid.  Airway is unremarkable.  Incidental imaging of the upper abdomen shows no acute findings. No worrisome lytic or sclerotic lesions.  IMPRESSION:  Right upper and right lower lobe pneumonia. Associated fluid in multiple bullous lesions in the right upper and right lower lobes.  Original Report Authenticated By: Reyes Ivan, M.D.   Dg Esophagus  03/08/2012  *RADIOLOGY REPORT*  Clinical Data: Dysphagia.  ESOPHOGRAM / BARIUM SWALLOW  Technique:  Combined double contrast and single contrast examination performed using effervescent crystals, thick barium liquid, and thin barium liquid.  Fluoroscopy time:  1.2 minutes.  Comparison: 02/23/2012  Findings:  The cervical and thoracic esophagus are patent.  No high- grade stricture or mass.  Normal motility of the esophagus.  The patient ingested a 13 mm barium tablet which easily passed through the esophagus and into the stomach.  No reflux identified.  IMPRESSION:  1.  Normal esophagram.   Original Report Authenticated By: Rosealee Albee, M.D.   Dg Abd 2 Views  02/26/2012  *RADIOLOGY REPORT*  Clinical Data: Nausea and vomiting.  Hodgkin's lymphoma.  COPD.  ABDOMEN - 2 VIEW  Comparison: PET of 12/21/2011.  Findings: Upright view abdomen and a supine view of the abdomen and pelvis.  Upright view abdomen demonstrates no free intraperitoneal air or significant air fluid levels.  Interstitial thickening at the lung bases without lobar consolidation.  Supine view of the abdomen pelvis demonstrates a large colonic stool burden.  Distal gas.  No bowel distention.  Minimal convex right lumbar spine curvature.  IMPRESSION: Colonic stool burden suggests constipation.  No acute findings.  Original Report Authenticated By: Consuello Bossier, M.D.    Scheduled Meds:   . albuterol  5 mg Nebulization Once  . aspirin  325 mg Oral STAT  . dextromethorphan-guaiFENesin  2 tablet Oral BID  . famotidine  10 mg Oral BID  . multivitamin with minerals  1 tablet Oral Daily  . pantoprazole  40 mg Oral Daily  . piperacillin-tazobactam (ZOSYN)  IV  3.375 g Intravenous Once  . piperacillin-tazobactam (ZOSYN)  IV  3.375 g Intravenous Q8H  . potassium chloride SA  20 mEq Oral Daily  . sodium chloride  1,000 mL Intravenous Once  . sodium chloride  1,000 mL Intravenous Once  . sodium chloride  1,000 mL Intravenous Once  . sodium chloride  500 mL Intravenous Once  . sodium chloride  500 mL Intravenous Once  . sodium chloride  3 mL Intravenous Q12H  . vancomycin  750 mg Intravenous Q48H  . vancomycin  1,000 mg Intravenous Once  . DISCONTD: everolimus  10 mg Oral See admin instructions   Continuous Infusions:   .  sodium bicarbonate infusion 1/4 NS 1000 mL    . DISCONTD: sodium chloride 100 mL/hr at 03/23/12 1630    Principal Problem:  *Sepsis Active Problems:  Hodgkin's lymphoma in relapse  COPD (chronic obstructive pulmonary disease)  Hyperlipidemia  Hypotension  Acute on chronic renal failure   Anemia  CKD (chronic kidney disease), stage III  Metabolic acidosis  SIRS (systemic inflammatory response syndrome)  HCAP (healthcare-associated pneumonia)  Thrombocytopenia    Time spent: 50 mins    [facility]  [facility] Pager [phone]. If 8PM-8AM, please contact night-coverage at [email or web address], password [facility] 03/24/2012, 9:10 AM  LOS: 1 day

## 2012-03-24 NOTE — Consult Note (Signed)
Name: Keith Bell MRN: [id number] DOB: [birth year]    LOS: 1  Referring Provider: [facility] Reason for Referral:  Pna/ shock  PULMONARY / CRITICAL CARE MEDICINE  HPI: 67 yobm quit smoking x 30 days pta with Hodgkin's eval by [facility] during admit with last note 7/10 rec augmentin for necrotizing pna, rul, nos,  cxr better on readmit for sob7/22-- 26 pccm not reconsulted but readmit 8/9 while still taking levaquin ?qod? With 3 days h/o cough with brown mucus and new pleuritic L ant cp, sob across the room and N and V but no h/o dysphagia or dental problems   Sleeping ok without nocturnal  or early am exacerbation  of respiratory  c/o's or need for noct saba. Also denies any obvious fluctuation of symptoms with weather or environmental changes or other aggravating or alleviating factors except as outlined above   Past Medical History  Diagnosis Date  . Dyslipidemia 2005  . Cancer     stomach  . Hypertension     No longer on BP pills x 4 years  . Hodgkin's lymphoma   . CKD (chronic kidney disease), stage III   . Anemia    Past Surgical History  Procedure Date  . No past surgeries    Prior to Admission medications   Medication Sig Start Date End Date Taking? Authorizing Provider  albuterol (PROVENTIL HFA;VENTOLIN HFA) 108 (90 BASE) MCG/ACT inhaler Inhale 2 puffs into the lungs every 4 (four) hours as needed. For shortness of breath.   Yes Historical Provider, MD  amoxicillin-clavulanate (AUGMENTIN) 875-125 MG per tablet Take 1 tablet by mouth 2 (two) times daily. Therapy for 30 days. Pt's on day 20 of therapy   Yes Historical Provider, MD  everolimus (AFINITOR) 10 MG tablet Take 10 mg by mouth See admin instructions. Pt takes this every other day.   Yes Historical Provider, MD  HYDROcodone-acetaminophen (VICODIN) 5-500 MG per tablet Take 1 tablet by mouth every 6 (six) hours as needed. For pain. 03/14/12  Yes Gerarda Fraction Murinson, MD  HYDROcodone-homatropine [facility]) 5-1.5 MG/5ML  syrup Take 5 mLs by mouth every 6 (six) hours as needed. cough   Yes Historical Provider, MD  Multiple Vitamin (MULTIVITAMIN WITH MINERALS) TABS Take 1 tablet by mouth daily.   Yes Historical Provider, MD  ondansetron (ZOFRAN) 8 MG tablet Take 8 mg by mouth every 8 (eight) hours as needed. For nausea 02/10/12  Yes Alison Murray, MD  pantoprazole (PROTONIX) 40 MG tablet Take 1 tablet (40 mg total) by mouth daily. 09/01/11 08/31/12 Yes Gerarda Fraction Murinson, MD  potassium chloride SA (K-DUR,KLOR-CON) 20 MEQ tablet Take 20 mEq by mouth daily.   Yes Historical Provider, MD  ranitidine (ZANTAC) 150 MG tablet Take 150 mg by mouth 2 (two) times daily as needed. Heartburn   Yes Historical Provider, MD  temazepam (RESTORIL) 30 MG capsule Take 1 capsule (30 mg total) by mouth at bedtime as needed. 03/14/12  Yes [name], MD   Allergies Allergies  Allergen Reactions  . Aspirin Nausea Only    Family History Family History  Problem Relation Age of Onset  . Heart disease Mother   . Heart disease Father   . Cancer Father     multiple myeloma  . Cancer Sister     breast cancer  . Cancer Sister     breast cancer   Social History  reports that he quit smoking about 7 weeks ago. He has never used smokeless tobacco. He reports that  he does not drink alcohol or use illicit drugs.  Review Of Systems:   ROS  The following are not active complaints unless bolded sore throat, dysphagia, dental problems, itching, sneezing,  nasal congestion or excess/ purulent secretions, ear ache,   fever, chills, sweats, unintended wt loss, pleuritic or exertional cp, hemoptysis,  orthopnea pnd or leg swelling, presyncope, palpitations, heartburn, abdominal pain, anorexia, nausea, vomiting, diarrhea  or change in bowel or urinary habits, change in stools or urine, dysuria,hematuria,  rash, arthralgias, visual complaints, headache, numbness weakness or ataxia or problems with walking or coordination,  change in mood/affect or  memory.        Current Status: Step down, 30 deg hob, no 02   Vital Signs: Temp:  [97.4 F (36.3 C)-99.4 F (37.4 C)] 98.4 F (36.9 C) (08/10 0800) Pulse Rate:  [25-110] 91  (08/10 0900) Resp:  [15-30] 24  (08/10 0900) BP: (80-138)/(47-82) 99/66 mmHg (08/10 0900) SpO2:  [96 %-100 %] 99 % (08/10 0900) FiO2 (%):  [2 %] 2 % (08/09 1106) Weight:  [111 lb 15.9 oz (50.8 kg)-114 lb 10.2 oz (52 kg)] 111 lb 15.9 oz (50.8 kg) (08/10 0500)  Physical Examination: General: chronically ill >>> stated age HEENT mild turbinate edema.  Oropharynx no thrush or excess pnd or cobblestoning.  No JVD or cervical adenopathy. Mild accessory muscle hypertrophy. Trachea midline, nl thryroid. Chest was hyperinflated by percussion with diminished breath sounds and moderate increased exp time without wheeze. Hoover sign positive at mid inspiration. Regular rate and rhythm without murmur gallop or rub or increase P2 or edema.  Abd: no hsm, nl excursion. Ext warm without cyanosis or clubbing.     Principal Problem:  *Sepsis Active Problems:  Hodgkin's lymphoma in relapse  COPD (chronic obstructive pulmonary disease)  Hyperlipidemia  Hypotension  Acute on chronic renal failure  Anemia  CKD (chronic kidney disease), stage III  Metabolic acidosis  SIRS (systemic inflammatory response syndrome)  HCAP (healthcare-associated pneumonia)  Thrombocytopenia   ASSESSMENT AND PLAN  PULMONARY No results found for this basename: PHART:5,PCO2:5,PCO2ART:5,PO2ART:5,HCO3:5,O2SAT:5 in the last 168 hours CXR:  RUL Pna, L mid zone infiltrate also  A:   Recurrent pna in compromised host Rec see ID section  Probably will need fob this admit  CARDIOVASCULAR  Lab 03/23/12 1120 03/23/12 0955  TROPONINI -- <0.30  LATICACIDVEN 2.1 --  PROBNP -- --    Cortisol 7/23 >  30  A Probably vol depleted with low grade sepsis rec fluids, no pressor   RENAL  Lab 03/24/12 0323 03/23/12 0955  NA 140 143  K 3.7 3.2*  CL  111 110  CO2 14* 14*  BUN 45* 51*  CREATININE 2.40* 2.81*  CALCIUM 8.1* 9.5  MG -- --  PHOS -- --   Intake/Output      08/09 0701 - 08/10 0700 08/10 0701 - 08/11 0700   P.O. 1380 120   I.V. (mL/kg) 1650 (32.5) 100 (2)   Blood 312.5    IV Piggyback 1100 1000   Total Intake(mL/kg) 4442.5 (87.5) 1220 (24)   Urine (mL/kg/hr) 850 (0.7) 125   Emesis/NG output  1   Total Output 850 126   Net +3592.5 +1094           A:  CRI with creat around 1.5 acutely worse with sepsis/dehyration       Metabolic Acidosis both NG(renal insuf) and AG component (cri/sepsis) P:  Agree with vol exp with [id number] in iv fluids    GASTROINTESTINAL  No results found for this basename: AST:5,ALT:5,ALKPHOS:5,BILITOT:5,PROT:5,ALBUMIN:5 in the last 168 hours  A:  No issues  HEMATOLOGIC  Lab 03/24/12 0323 03/23/12 0955  HGB 8.2* 8.0*  HCT 23.8* 23.9*  PLT 44* 62*  INR -- 1.16  APTT -- --   A: Anemia in setting of sepsis, keep hgb > 8  INFECTIOUS  Lab 03/24/12 0323 03/23/12 0955  WBC 7.0 8.5  PROCALCITON -- 13.51   Cultures: Sputum 8/9>> MRSA 8/9 > neg BC x 2 8/9 >>> Urine strep 8/9 > neg Urine Legionella 8/9 >  Antibiotics: Zosyn 8/9 >>> Vanc 8/9 >>>    A: HCAP P Agree with Z/V pending cultures  ENDOCRINE No results found for this basename: GLUCAP:5 in the last 168 hours A:  No issues  NEUROLOGIC  A: No issues  BEST PRACTICE / DISPOSITION Level of Care:  SD Primary Service:  [facility]:  Heme/onc Code Status:  full Diet:  Per [facility] DVT Px:  PAS (anemia, r/o gib GI Px:  h2 Skin Integrity:  ok Social / Family:  None at bedside  Question whether he really took the abx given and note confusion levaquin vs augmentin in records  The patient is critically ill with multiple organ systems failure and requires high complexity decision making for assessment and support, frequent evaluation and titration of therapies, application of advanced monitoring technologies and  extensive interpretation of multiple databases. Critical Care Time devoted to patient care services described in this note is 45 minutes.   Sandrea Hughs, M.D. Pulmonary and Critical Care Medicine [facility] Pager: [phone]  03/24/2012, 10:23 AM

## 2012-03-24 NOTE — Progress Notes (Signed)
Patient's HR abruptly jumped to 130s, sustaining.  BP 103/60.  Patient coughing incessantly despite PRN cough syrup given.  MD notified.  New orders received.  Will continue to monitor.

## 2012-03-24 NOTE — Progress Notes (Signed)
Pt. Taken by bed to radiology department for VQ scan.

## 2012-03-24 NOTE — Progress Notes (Signed)
Medical Oncology Consultation 03-24-2012  Reason for Consult: Hodgkin's lymphoma on active treatment with Afinitor Referring Physician: hospitalist  Keith Bell is an 61 y.o. 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 See Keith Bell's note of 03-16-12 for complete summary of treatment, which has included 6 different systemic regimens thru Dec 2012. Most recently he has been treated with Afinitor since Jan 2013, which he has tolerated at dose of 10 mg qod. Last PET in May 3-13 had some minimal increase in right hilar and left periaortic nodes with no new adenopathy then. He saw Keith Bell last on 03-16-12 with  CBC then WBC 13.9, ANC 11.4, Hgb 10 and plt 101k, and creatinine 2.0. He did not keep scheduled appointment for labs at [facility] on 03-23-12 "too sick".  Course has been complicated by RUL pneumonia initially diagnosed in early June 2013, present hospitalization being the fourth since early June. This pneumonia has been characterized as "necrotizing polymicrobial pneumonia". He was admitted again on 03-23-12 with worse respiratory symptoms for past several days, including increased but minimally productive cough and chills. He has new left sided chest pain with the cough and also complains of hiccoughs for same time frame. He was admitted to ICU with apparent sepsis/ dehydration, is on broad spectrum antibiotic coverage and has been seen by pulmonary/CCM.   Keith Bell is alert and conversant, tho frequent coughing not clearly productive. He is able to give history, tells me last shaking chills were last pm. He denies GI symptoms including diarrhea, does have new hiccoughs as above. He denies noted bleeding, swelling in LE, other new pain. He has not had nausea or vomiting. He took afinitor as directed until time of admission and tells me that he was still on oral antibiotics for the pneumonia until time of  admission. He has had further recent weight loss.  Allergies:  Allergies  Allergen Reactions  . Aspirin Nausea Only   Past Medical History  Diagnosis Date  . Dyslipidemia 2005  . Cancer     stomach  . Hypertension     No longer on BP pills x 4 years  . Hodgkin's lymphoma   . CKD (chronic kidney disease), stage III   . Anemia     Past Surgical History  Procedure Date  . No past surgeries     Family History  Problem Relation Age of Onset  . Heart disease Mother   . Heart disease Father   . Cancer Father     multiple myeloma  . Cancer Sister     breast cancer  . Cancer Sister     breast cancer    Social History:  reports that he quit smoking about 7 weeks ago. He has never used smokeless tobacco. He reports that he does not drink alcohol or use illicit drugs.   Medications: I have reviewed the patient's current medications.  Blood pressure 104/76, pulse 100, temperature 98.5 F (36.9 C), temperature source Oral, resp. rate 19, height 5' 6.14" (1.68 m), weight 111 lb 15.9 oz (50.8 kg), SpO2 91.00%. Pleasant gentleman, very thin, frequent hard coughing which prevents him from talking. Cooperative and otherwise not in extremis. Oral mucosa moist without candida. No cervical or supraclavicular adenopathy. Diminished breath sounds bilaterally without wheeze or rales. Heart RRR, tachy, no gallop. Abdomen soft, not distended, not tender, quiet. LE with SCD on, no pitting edema or cords. Feet warm. Moves  all extremities in bed. PAC site not remarkable. No skin rash seen. No noted bleeding at IV sites, no petechiae seen.    Results for orders placed during the hospital encounter of 03/23/12 (from the past 48 hour(s))  CBC     Status: Abnormal   Collection Time   03/23/12  9:55 AM      Component Value Range Comment   WBC 8.5  4.0 - 10.5 K/uL    RBC 2.77 (*) 4.22 - 5.81 MIL/uL    Hemoglobin 8.0 (*) 13.0 - 17.0 g/dL    HCT 16.1 (*) 09.6 - 52.0 %    MCV 86.3  78.0 - 100.0 fL     MCH 28.9  26.0 - 34.0 pg    MCHC 33.5  30.0 - 36.0 g/dL    RDW 04.5 (*) 40.9 - 15.5 %    Platelets 62 (*) 150 - 400 K/uL   BASIC METABOLIC PANEL     Status: Abnormal   Collection Time   03/23/12  9:55 AM      Component Value Range Comment   Sodium 143  135 - 145 mEq/L    Potassium 3.2 (*) 3.5 - 5.1 mEq/L    Chloride 110  96 - 112 mEq/L    CO2 14 (*) 19 - 32 mEq/L    Glucose, Bld 192 (*) 70 - 99 mg/dL    BUN 51 (*) 6 - 23 mg/dL    Creatinine, Ser 8.11 (*) 0.50 - 1.35 mg/dL    Calcium 9.5  8.4 - 91.4 mg/dL    GFR calc non Af Amer 23 (*) >90 mL/min    GFR calc Af Amer 27 (*) >90 mL/min   TROPONIN I     Status: Normal   Collection Time   03/23/12  9:55 AM      Component Value Range Comment   Troponin I <0.30  <0.30 ng/mL   PROTIME-INR     Status: Normal   Collection Time   03/23/12  9:55 AM      Component Value Range Comment   Prothrombin Time 15.0  11.6 - 15.2 seconds    INR 1.16  0.00 - 1.49   PROCALCITONIN     Status: Normal   Collection Time   03/23/12  9:55 AM      Component Value Range Comment   Procalcitonin 13.51     PREPARE RBC (CROSSMATCH)     Status: Normal   Collection Time   03/23/12  9:55 AM      Component Value Range Comment   Order Confirmation ORDER PROCESSED BY BLOOD BANK     LACTIC ACID, PLASMA     Status: Normal   Collection Time   03/23/12 11:20 AM      Component Value Range Comment   Lactic Acid, Venous 2.1  0.5 - 2.2 mmol/L   TYPE AND SCREEN     Status: Normal (Preliminary result)   Collection Time   03/23/12 11:35 AM      Component Value Range Comment   ABO/RH(D) O POS      Antibody Screen NEG      Sample Expiration 03/26/2012      Unit Number [id number]      Blood Component Type RBC LR PHER1      Unit division 00      Status of Unit ISSUED,FINAL      Transfusion Status OK TO TRANSFUSE      Crossmatch Result Compatible  Unit Number [id number]      Blood Component Type RBC LR PHER2      Unit division 00      Status of Unit ALLOCATED      Transfusion  Status OK TO TRANSFUSE      Crossmatch Result Compatible     URINALYSIS, ROUTINE W REFLEX MICROSCOPIC     Status: Abnormal   Collection Time   03/23/12  1:38 PM      Component Value Range Comment   Color, Urine YELLOW  YELLOW    APPearance CLEAR  CLEAR    Specific Gravity, Urine 1.021  1.005 - 1.030    pH 6.0  5.0 - 8.0    Glucose, UA 500 (*) NEGATIVE mg/dL    Hgb urine dipstick SMALL (*) NEGATIVE    Bilirubin Urine NEGATIVE  NEGATIVE    Ketones, ur NEGATIVE  NEGATIVE mg/dL    Protein, ur 409 (*) NEGATIVE mg/dL    Urobilinogen, UA 0.2  0.0 - 1.0 mg/dL    Nitrite NEGATIVE  NEGATIVE    Leukocytes, UA NEGATIVE  NEGATIVE   URINE MICROSCOPIC-ADD ON     Status: Abnormal   Collection Time   03/23/12  1:38 PM      Component Value Range Comment   Squamous Epithelial / LPF FEW (*) RARE    RBC / HPF 0-2  <3 RBC/hpf    Bacteria, UA FEW (*) RARE   MRSA PCR SCREENING     Status: Normal   Collection Time   03/23/12  2:37 PM      Component Value Range Comment   MRSA by PCR NEGATIVE  NEGATIVE   CULTURE, EXPECTORATED SPUTUM-ASSESSMENT     Status: Normal   Collection Time   03/23/12  3:33 PM      Component Value Range Comment   Specimen Description SPUTUM      Special Requests NONE      Sputum evaluation        Value: THIS SPECIMEN IS ACCEPTABLE. RESPIRATORY CULTURE REPORT TO FOLLOW.   Report Status 03/23/2012 FINAL     CULTURE, RESPIRATORY     Status: Normal (Preliminary result)   Collection Time   03/23/12  4:05 PM      Component Value Range Comment   Specimen Description SPUTUM      Special Requests NONE      Gram Stain        Value: NO WBC SEEN     NO SQUAMOUS EPITHELIAL CELLS SEEN     NO ORGANISMS SEEN   Culture PENDING      Report Status PENDING     LEGIONELLA ANTIGEN, URINE     Status: Normal   Collection Time   03/23/12  5:30 PM      Component Value Range Comment   Specimen Description URINE, RANDOM      Special Requests NONE      Legionella Antigen, Urine Negative for Legionella  pneumophilia serogroup 1      Report Status 03/24/2012 FINAL     STREP PNEUMONIAE URINARY ANTIGEN     Status: Normal   Collection Time   03/23/12  5:30 PM      Component Value Range Comment   Strep Pneumo Urinary Antigen NEGATIVE  NEGATIVE   CBC     Status: Abnormal   Collection Time   03/24/12  3:23 AM      Component Value Range Comment   WBC 7.0  4.0 - 10.5 K/uL  RBC 2.83 (*) 4.22 - 5.81 MIL/uL    Hemoglobin 8.2 (*) 13.0 - 17.0 g/dL    HCT 40.9 (*) 81.1 - 52.0 %    MCV 84.1  78.0 - 100.0 fL    MCH 29.0  26.0 - 34.0 pg    MCHC 34.5  30.0 - 36.0 g/dL    RDW 91.4 (*) 78.2 - 15.5 %    Platelets 44 (*) 150 - 400 K/uL   BASIC METABOLIC PANEL     Status: Abnormal   Collection Time   03/24/12  3:23 AM      Component Value Range Comment   Sodium 140  135 - 145 mEq/L    Potassium 3.7  3.5 - 5.1 mEq/L    Chloride 111  96 - 112 mEq/L    CO2 14 (*) 19 - 32 mEq/L    Glucose, Bld 100 (*) 70 - 99 mg/dL    BUN 45 (*) 6 - 23 mg/dL    Creatinine, Ser 9.56 (*) 0.50 - 1.35 mg/dL    Calcium 8.1 (*) 8.4 - 10.5 mg/dL    GFR calc non Af Amer 28 (*) >90 mL/min    GFR calc Af Amer 32 (*) >90 mL/min   PROCALCITONIN     Status: Normal   Collection Time   03/24/12 11:49 AM      Component Value Range Comment   Procalcitonin 7.59     LACTIC ACID, PLASMA     Status: Normal   Collection Time   03/24/12 11:49 AM      Component Value Range Comment   Lactic Acid, Venous 0.7  0.5 - 2.2 mmol/L   LACTATE DEHYDROGENASE     Status: Normal   Collection Time   03/24/12 11:49 AM      Component Value Range Comment   LDH 125  94 - 250 U/L   SAVE SMEAR     Status: Normal   Collection Time   03/24/12 11:49 AM      Component Value Range Comment   Smear Review SMEAR STAINED AND AVAILABLE FOR REVIEW     PRO B NATRIURETIC PEPTIDE     Status: Abnormal   Collection Time   03/24/12 11:49 AM      Component Value Range Comment   Pro B Natriuretic peptide (BNP) 1538.0 (*) 0 - 125 pg/mL   MRSA screen negative  Dg Chest  2 View  03/23/2012  *RADIOLOGY REPORT*  Clinical Data: Cough.  Pleurisy.  Short of breath.  History of cancer.  CHEST - 2 VIEW  Comparison: 03/05/2012.  Findings: Progressive airspace disease is present in the right upper lobe, best seen on the lateral view.  When comparing lateral views to prior chest radiograph 03/05/2012, there is continued/progressive consolidation of the right upper lobe.  Support apparatus appears unchanged.  There is some faint airspace disease in the left midlung as well.  Cardiopericardial silhouette unchanged.  Right IJ power port appears similar.  IMPRESSION: Progressive consolidation of the right upper lobe, best seen on the lateral view.  Faint airspace opacity in the left midlung, which may also represent pneumonia.  Original Report Authenticated By: Andreas Newport, M.D.        Assessment/Plan: 1. Hodgkin's disease: has been very refractory to multiple treatment regimens. Agree with holding afinitor with low counts now. 2.Sepsis syndrome, persistant pneumonia: cultures pending, on vanc + zosyn. Agree with consideration of bronchoscopy for further evaluation and diagnostic information. Likely contributing to thrombocytopenia now. DIC screen ordered.  3.see Keith Bell's note 03-16-12 for discussion of code status 4.long tobacco recently DCd, underlying COPD 5.new hiccoughs: may need to image upper abd if not included on CT chest pending  Our service will follow with you.  Cosby Proby P 03/24/2012, 1:49 PM

## 2012-03-24 NOTE — Progress Notes (Signed)
Call placed to Dr. Vassie Loll at [facility] regarding order for 5mg  lopressor IV given earlier--pt. SBP < 100-order from Dr. Vassie Loll to bolus pt with NS 500 and reduce dose of lopressor to 2.5mg  IV.

## 2012-03-25 DIAGNOSIS — D649 Anemia, unspecified: Secondary | ICD-10-CM

## 2012-03-25 DIAGNOSIS — E870 Hyperosmolality and hypernatremia: Secondary | ICD-10-CM | POA: Diagnosis not present

## 2012-03-25 DIAGNOSIS — D61818 Other pancytopenia: Secondary | ICD-10-CM

## 2012-03-25 DIAGNOSIS — E876 Hypokalemia: Secondary | ICD-10-CM

## 2012-03-25 LAB — CBC
HCT: 30.9 % — ABNORMAL LOW (ref 39.0–52.0)
Platelets: 43 10*3/uL — ABNORMAL LOW (ref 150–400)
RDW: 17.6 % — ABNORMAL HIGH (ref 11.5–15.5)
WBC: 10.1 10*3/uL (ref 4.0–10.5)

## 2012-03-25 LAB — CBC WITH DIFFERENTIAL/PLATELET
Basophils Absolute: 0.1 10*3/uL (ref 0.0–0.1)
Basophils Relative: 1 % (ref 0–1)
Eosinophils Absolute: 0.6 10*3/uL (ref 0.0–0.7)
HCT: 21.5 % — ABNORMAL LOW (ref 39.0–52.0)
Hemoglobin: 7.6 g/dL — ABNORMAL LOW (ref 13.0–17.0)
Lymphocytes Relative: 5 % — ABNORMAL LOW (ref 12–46)
Monocytes Relative: 6 % (ref 3–12)
Neutro Abs: 7.9 10*3/uL — ABNORMAL HIGH (ref 1.7–7.7)
Neutrophils Relative %: 82 % — ABNORMAL HIGH (ref 43–77)
RDW: 18.1 % — ABNORMAL HIGH (ref 11.5–15.5)
WBC: 9.7 10*3/uL (ref 4.0–10.5)

## 2012-03-25 LAB — CARDIAC PANEL(CRET KIN+CKTOT+MB+TROPI)
Relative Index: INVALID (ref 0.0–2.5)
Total CK: 20 U/L (ref 7–232)
Troponin I: <0.3 ng/mL (ref <0.30)

## 2012-03-25 LAB — BASIC METABOLIC PANEL
Calcium: 7.5 mg/dL — ABNORMAL LOW (ref 8.4–10.5)
Chloride: 111 mEq/L (ref 96–112)
GFR calc Af Amer: 38 mL/min — ABNORMAL LOW (ref >90)
GFR calc non Af Amer: 31 mL/min — ABNORMAL LOW (ref >90)
Glucose, Bld: 179 mg/dL — ABNORMAL HIGH (ref 70–99)
Potassium: 2.8 mEq/L — ABNORMAL LOW (ref 3.5–5.1)
Potassium: 3.4 mEq/L — ABNORMAL LOW (ref 3.5–5.1)
Sodium: 148 mEq/L — ABNORMAL HIGH (ref 135–145)

## 2012-03-25 LAB — VITAMIN B12: Vitamin B-12: >2000 pg/mL — ABNORMAL HIGH (ref 211–911)

## 2012-03-25 LAB — IRON AND TIBC: TIBC: 78 ug/dL — ABNORMAL LOW (ref 215–435)

## 2012-03-25 LAB — PROCALCITONIN: Procalcitonin: 6.2 ng/mL

## 2012-03-25 LAB — PREPARE RBC (CROSSMATCH)

## 2012-03-25 LAB — MAGNESIUM: Magnesium: 1.2 mg/dL — ABNORMAL LOW (ref 1.5–2.5)

## 2012-03-25 LAB — FERRITIN: Ferritin: 3967 ng/mL — ABNORMAL HIGH (ref 22–322)

## 2012-03-25 MED ORDER — VANCOMYCIN HCL 1000 MG IV SOLR
750.0000 mg | INTRAVENOUS | Status: DC
  Administered 2012-03-25 – 2012-03-26 (×2): 750 mg via INTRAVENOUS
  Filled 2012-03-25 (×2): qty 750

## 2012-03-25 MED ORDER — DEXTROSE 5 % IV SOLN
INTRAVENOUS | Status: DC
  Administered 2012-03-25 – 2012-03-26 (×2): via INTRAVENOUS

## 2012-03-25 MED ORDER — DEXTROSE 5 % IV SOLN
3.0000 g | Freq: Once | INTRAVENOUS | Status: AC
  Administered 2012-03-25: 3 g via INTRAVENOUS
  Filled 2012-03-25: qty 6

## 2012-03-25 MED ORDER — DIPHENHYDRAMINE HCL 25 MG PO CAPS
25.0000 mg | ORAL_CAPSULE | Freq: Once | ORAL | Status: AC
  Administered 2012-03-25: 25 mg via ORAL
  Filled 2012-03-25: qty 1

## 2012-03-25 MED ORDER — SODIUM CHLORIDE 0.9 % IV BOLUS (SEPSIS)
1000.0000 mL | Freq: Once | INTRAVENOUS | Status: AC
  Administered 2012-03-25: 1000 mL via INTRAVENOUS

## 2012-03-25 MED ORDER — ACETAMINOPHEN 325 MG PO TABS
650.0000 mg | ORAL_TABLET | Freq: Once | ORAL | Status: AC
  Administered 2012-03-25: 650 mg via ORAL
  Filled 2012-03-25: qty 2

## 2012-03-25 MED ORDER — POTASSIUM CHLORIDE CRYS ER 20 MEQ PO TBCR
40.0000 meq | EXTENDED_RELEASE_TABLET | ORAL | Status: AC
  Administered 2012-03-25 (×3): 40 meq via ORAL
  Filled 2012-03-25 (×3): qty 2

## 2012-03-25 MED ORDER — DOCUSATE SODIUM 100 MG PO CAPS
100.0000 mg | ORAL_CAPSULE | Freq: Two times a day (BID) | ORAL | Status: DC
  Administered 2012-03-25 – 2012-04-05 (×20): 100 mg via ORAL
  Filled 2012-03-25 (×25): qty 1

## 2012-03-25 MED ORDER — POLYETHYLENE GLYCOL 3350 17 G PO PACK
17.0000 g | PACK | Freq: Two times a day (BID) | ORAL | Status: DC
Start: 2012-03-25 — End: 2012-03-25
  Administered 2012-03-25: 17 g via ORAL
  Filled 2012-03-25 (×2): qty 1

## 2012-03-25 MED ORDER — BENZONATATE 100 MG PO CAPS
100.0000 mg | ORAL_CAPSULE | Freq: Three times a day (TID) | ORAL | Status: DC
  Administered 2012-03-25 (×3): 100 mg via ORAL
  Filled 2012-03-25 (×6): qty 1

## 2012-03-25 NOTE — Progress Notes (Addendum)
ANTIBIOTIC CONSULT NOTE - Follow UP  Pharmacy Consult for vancomycin/Zosyn Indication: HCAP/SIRS  Allergies  Allergen Reactions  . Aspirin Nausea Only    Patient Measurements: Height: 5' 6.14" (168 cm) Weight: 122 lb 5.7 oz (55.5 kg) IBW/kg (Calculated) : 64.13    Vital Signs: Temp: 97.6 F (36.4 C) (08/11 0800) Temp src: Oral (08/11 0800) BP: 101/58 mmHg (08/11 0900) Pulse Rate: 107  (08/11 0900) Intake/Output from previous day: 08/10 0701 - 08/11 0700 In: 7700 [P.O.:1200; I.V.:2850; IV Piggyback:3650] Out: 4228 [Urine:4225; Emesis/NG output:1; Stool:2] Intake/Output from this shift: Total I/O In: 365 [P.O.:240; I.V.:125] Out: 601 [Urine:600; Stool:1]  Labs:  [facility] 03/25/12 0828 03/24/12 1600 03/24/12 1005 03/24/12 0323 03/23/12 0955  WBC 9.7 -- -- 7.0 8.5  HGB 7.6* -- -- 8.2* 8.0*  PLT 40* 47* -- 44* --  LABCREA -- -- 12.9 -- --  CREATININE 2.19* -- -- 2.40* 2.81*   Estimated Creatinine Clearance: 28.2 ml/min (by C-G formula based on Cr of 2.19). Normalized CrCl ~ 36 ml/min/1.20m2 No results found for this basename: VANCOTROUGH:2,VANCOPEAK:2,VANCORANDOM:2,GENTTROUGH:2,GENTPEAK:2,GENTRANDOM:2,TOBRATROUGH:2,TOBRAPEAK:2,TOBRARND:2,AMIKACINPEAK:2,AMIKACINTROU:2,AMIKACIN:2, in the last 72 hours   Microbiology: Recent Results (from the past 720 hour(s))  TECHNOLOGIST REVIEW     Status: Normal   Collection Time   03/02/12  8:32 AM      Component Value Range Status Comment   Technologist Review     Final    Value: Metas and Myelocytes present, Few Helmets and Fragments  CULTURE, BLOOD (ROUTINE X 2)     Status: Normal   Collection Time   03/06/12  1:00 AM      Component Value Range Status Comment   Specimen Description BLOOD R WRIST   Final    Special Requests     Final    Value: Immunocompromised BOTTLES DRAWN AEROBIC AND ANAEROBIC   Culture  Setup Time 03/06/2012 04:11   Final    Culture NO GROWTH 5 DAYS   Final    Report Status 03/12/2012 FINAL   Final    CULTURE, BLOOD (ROUTINE X 2)     Status: Normal   Collection Time   03/06/12  1:01 AM      Component Value Range Status Comment   Specimen Description BLOOD PORTA CATH   Final    Special Requests     Final    Value: Immunocompromised BOTTLES DRAWN AEROBIC AND ANAEROBIC   Culture  Setup Time 03/06/2012 04:11   Final    Culture NO GROWTH 5 DAYS   Final    Report Status 03/12/2012 FINAL   Final   MRSA PCR SCREENING     Status: Normal   Collection Time   03/06/12  1:49 AM      Component Value Range Status Comment   MRSA by PCR NEGATIVE  NEGATIVE Final   TECHNOLOGIST REVIEW     Status: Normal   Collection Time   03/16/12 10:55 AM      Component Value Range Status Comment   Technologist Review Few Metas, Rouleaux Present   Final   MRSA PCR SCREENING     Status: Normal   Collection Time   03/23/12  2:37 PM      Component Value Range Status Comment   MRSA by PCR NEGATIVE  NEGATIVE Final   CULTURE, EXPECTORATED SPUTUM-ASSESSMENT     Status: Normal   Collection Time   03/23/12  3:33 PM      Component Value Range Status Comment   Specimen Description SPUTUM   Final  Special Requests NONE   Final    Sputum evaluation     Final    Value: THIS SPECIMEN IS ACCEPTABLE. RESPIRATORY CULTURE REPORT TO FOLLOW.   Report Status 03/23/2012 FINAL   Final   CULTURE, RESPIRATORY     Status: Normal (Preliminary result)   Collection Time   03/23/12  4:05 PM      Component Value Range Status Comment   Specimen Description SPUTUM   Final    Special Requests NONE   Final    Gram Stain     Final    Value: NO WBC SEEN     NO SQUAMOUS EPITHELIAL CELLS SEEN     NO ORGANISMS SEEN   Culture PENDING   Incomplete    Report Status PENDING   Incomplete     Medical History: Past Medical History  Diagnosis Date  . Dyslipidemia 2005  . Cancer     stomach  . Hypertension     No longer on BP pills x 4 years  . Hodgkin's lymphoma   . CKD (chronic kidney disease), stage III   . Anemia     Medications:    Scheduled:     . sodium chloride 0.9% NICU IV bolus  500 mL Intravenous Once  . acetaminophen  650 mg Oral Once  . benzonatate  100 mg Oral TID  . dextromethorphan-guaiFENesin  2 tablet Oral BID  . diphenhydrAMINE  25 mg Oral Once  . docusate sodium  100 mg Oral BID  . famotidine  20 mg Oral BID  . magnesium sulfate 1 - 4 g bolus IVPB  3 g Intravenous Once  . metoprolol      . metoprolol  2.5 mg Intravenous Once  . multivitamin with minerals  1 tablet Oral Daily  . piperacillin-tazobactam (ZOSYN)  IV  3.375 g Intravenous Q8H  . polyethylene glycol  17 g Oral BID  . potassium chloride SA  20 mEq Oral Daily  . potassium chloride  40 mEq Oral Q4H  . sodium chloride  1,000 mL Intravenous Once  . sodium chloride  500 mL Intravenous Once  . sodium chloride  500 mL Intravenous Once  . sodium chloride  3 mL Intravenous Q12H  . vancomycin  750 mg Intravenous Q24H  . DISCONTD: metoprolol  5 mg Intravenous Once  . DISCONTD: vancomycin  750 mg Intravenous Q48H   Infusions:     . dextrose    . DISCONTD:  sodium bicarbonate infusion 1/4 NS 1000 mL 125 mL/hr at 03/24/12 1845   Assessment:  60 YOM w/hx COPD and Hodgkin's Lymphoma admitted 8/9 w/SOB, cough, chills, subjective fever, pleuritic chest pain and chest congestion.  Recent hospitalizations x2 7/10-15 and 7/22-26 for necrotizing polymicrobial PNA. Most recent admission was tx w/IV Levaquin/Cepfepime and Vancomycin. Was d/c on Levaquin and Augmentin.  Currently on D#4 Vancomycin 1g IV x 1 given in ER at 1138 8/9, then Vancomycin 750mg  IV q48h and D#4 Zosyn 3.375g IV q8h, extended interval infusion for HCAP/SIRS  Scr slowly improving, 2.2 < 2.8 (baseline Scr ~ 1.5)  WBC wnl, Afebrile, Blood and sputum cx pending, Urine negative for strep pneumo and legionella antigens  Goal of Therapy:  Vancomycin trough level 15-20 mcg/ml  Plan:   Contiune Zosyn 3.375g IV q8 (extended interval infusion)   Change Vancomycin to 750mg  IV q24h  given improved Scr  Follow Scr/Cx  VT at Css  Adjust doses as appropriate  Gwen Her PharmD  [phone] 03/25/2012 9:47 AM

## 2012-03-25 NOTE — Progress Notes (Signed)
eLink Physician-Brief Progress Note Patient Name: Keith Bell DOB: [birth year] MRN: [id number]  Date of Service  03/25/2012   HPI/Events of Note   Hypotension, tachycardia in setting of pneumonia; UOP 3 liters in last 24 hours, O2 saturation 97% on RA  eICU Interventions  Bolus another liter of fluid; RN requesting ICU status due to frequent vitals   Intervention Category Major Interventions: Hypotension - evaluation and management  MCQUAID, DOUGLAS 03/25/2012, 1:02 AM

## 2012-03-25 NOTE — Progress Notes (Signed)
[facility] PROGRESS NOTE  Keith Bell AVW:[id number] DOB: [birth year] DOA: 03/23/2012 PCP: Eino Farber, MD  Assessment/Plan: Principal Problem:  *Sepsis Active Problems:  Hodgkin's lymphoma in relapse  COPD (chronic obstructive pulmonary disease)  Hyperlipidemia  Hypotension  Acute on chronic renal failure  Anemia  CKD (chronic kidney disease), stage III  Metabolic acidosis  SIRS (systemic inflammatory response syndrome)  HCAP (healthcare-associated pneumonia)  Thrombocytopenia  Hypomagnesemia  Hypokalemia  Hypernatremia  #1 hypotension Likely secondary to sepsis secondary to recurrent probable necrotizing pneumonia/healthcare associated pneumonia. Patient with systolic blood pressure in the 80s overnight. Patient given some fluid boluses. Pro calcitonin was elevated at 13 on admission decreasing and now at 6.20. Repeat pro calcitonin levels in AM.  Sputum Gram stain and cultures pending. Blood cultures are pending. Urine Legionella is negative. Urine strep pneumococcus is negative. Continue IV fluids and empiric antibiotics. PCCM following and appreciate input and recommendations.  #2 recurrent necrotizing pneumonia/healthcare associated pneumonia This is patient's third hospitalization for right upper lobe pneumonia first episode in June of 2013 second episode in July of 2013 with patient was diagnosed with a necrotizing pneumonia per pulmonary medicine. Patient was discharged home on Levaquin and augmentin for 3 weeks, however concern as to whether patient took full course of antibiotics. Patient is presenting with productive cough, chills, fever, chest x-ray consistent with worsening pneumonia. Sputum Gram stain and cultures are pending. Urine strep pneumococcus is negative. A urine Legionella is negative. Pro calcitonin levels were elevated at 13 on admission and now at 6.20 and trending down. Lactic acid level was 2.1 on admission and trending down. Will repeat pro  calcitonin in the a.m. Repeat CT of the chest with continued progression of severe consolidated pneumonia in the right upper lobe superimposed on severe centrilobular emphysema and also do a space disease in the lingula with a peripheral nodular component suggestive of a retinal same process in the right upper lobe, mild airspace disease in the right lower lobe. Pulmonary critical care is following and recommend probable bronchoscopy. Continue empiric IV Zosyn and vancomycin. Nebs as needed. Mucinex. We'll add [employer]. Pulmonary and critical care following.  #3 sepsis Likely secondary to problem #2. Urinalysis is negative. Chest x-ray consistent with recurrent worsening pneumonia. CT of the chest with multilobar and pneumonia and progression of worsening recurrent right upper lobe pneumonia .Blood cultures are pending. Pro calcitonin level was elevated on admission at 13 and trending down. Lactic acid level was 2.1 and trending down. Urine strep pneumococcus is negative. Urine Legionella is negative. Repeat pro calcitonin levels in the a.m. Continue empiric IV vancomycin and Zosyn. Pulmonary and critical care medicine ff.  #4 acute on chronic kidney disease stage III Patient's baseline creatinine is approximately 1.4-1.7. Creatinine on admission was 2.8. Likely secondary to a prerenal azotemia. Creatinine improving with hydration and currently at 2.19. Patient was also noted to be hypotensive overnight. Renal ultrasound normal. Continue supportive care follow.  #5 thrombocytopenia/Anemia Likely secondary to problem #3 and afinitor. Patient is status post 1 unit of packed red blood cells. Afinitor has been discontinued. Hgb is 7.6. Will transfuse 2 units of PRBCs. LDH WNL.  Haptoglobin elevated at 409. Peripheral smear negative for schistocytes, however with elevated d dimer and fibrinogen. Dobt if in DIC. Hematology/oncology ff and appreciate input and rxcs.  #6 COPD Stable. Nebs when  necessary.  #7 metabolic acidosis Likely secondary to sepsis versus acute on chronic kidney disease stage III. Improved with HCO3 in IVF. D/C HCO3 and  follow. See problem #3.  #8 hypomagnesemia/hypokalemia Replete.  #9 hypernatremia Change IV fluids to D5W.  #10 constipation MiraLAX twice a day. Colace 100 mg twice a day. If no resolution with constipation will consider an enema.  #11 Hodgkin's lymphoma, relapse We'll discontinue patient's afinitor secondary to his anemia, thrombocytopenia, sepsis. Hematology oncology is following.  #12 prophylaxis Pepcid for GI prophylaxis. SCDs for DVT prophylaxis.  #13: Code Status Discussed CODE STATUS with patient after reviewing Dr. Mamie Levers note from 03/17/2011. Patient is in agreement with a Limited code. No CPR. No mechanical ventilation. May use pressors or antiarrhythmics.   Code Status: Limited code. Family Communication: Updated patient and sister via telephone. Updated patient at bedside.  Disposition Plan: Currently on ICU status. Home when medically stable.   Brief narrative: Very pleasant 61 yo with hx copd, hodgkins lymphoma, necrotizing pna presents to ED cc sob and cough. Information provided by painted. States symptoms began 3 days ago have persisted and worsened. Associated symptoms include chills, subjective fever, pleuritic chest pain and sinus congestion as well as some nausea and 3 episodes of vomiting. Reports emesis clear without dk coffee ground substances. Denies dysuria/hematuria abdominal pain or diarrhea. In ED pt was tachycardic, hypotensive and chest xray concerning for pna. Of note pt was discharged from hospital 7/26 after 5 day stay for similar symptoms. Was discharge on levaquin to be taken for 3 week. [facility] asked to admit      Consultants:  Pulmonary: Dr Sherene Sires 03/24/12   Hematolgy/Oncology: Dr Darrold Span 03/24/12  Procedures:  CXR  03/23/12  Status post 1 unit of packed red blood cells on  03/23/2012  V/Q scan 03/24/12  CT Chest 03/24/12  Renal US 03/24/12  2 units PRBC today 03/25/12  Antibiotics:  IV Vancomycin 03/23/12  IV Zosyn 03/23/12  HPI/Subjective: Patient complaining of cough and pleuritic chest pain secondary to cough. Patient states hiccups improved. Patient complaining of constipation. Overnight patient with tachycardia with heart rates in the 140s to 150s in sinus tachycardia was given some Lopressor over blood pressure dropped into the 80s and had to be bolused some IV fluids. Heart rate is now in the 110s and systolic blood pressure in 101. Patient is mentating well.  Objective: Filed Vitals:   03/25/12 0600 03/25/12 0700 03/25/12 0800 03/25/12 0900  BP: 111/70 105/58 114/51 101/58  Pulse: 106 108 130 107  Temp:   97.6 F (36.4 C)   TempSrc:   Oral   Resp: 26 20 22 24   Height:      Weight:      SpO2: 96% 97% 96% 99%    Intake/Output Summary (Last 24 hours) at 03/25/12 0957 Last data filed at 03/25/12 0800  Gross per 24 hour  Intake   6720 ml  Output   4703 ml  Net   2017 ml    Exam: General: Alert, awake, oriented x3, in no acute distress. HEENT: No bruits, no goiter. Heart: Regular rate and rhythm, without murmurs, rubs, gallops. Lungs: Clear to auscultation bilaterally. Abdomen: Soft, nontender, nondistended, positive bowel sounds. Extremities: No clubbing cyanosis or edema with positive pedal pulses. Neuro: Grossly intact, nonfocal.  Data Reviewed: Basic Metabolic Panel:  Lab 03/25/12 1610 03/24/12 0323 03/23/12 0955  NA 148* 140 143  K 2.8* 3.7 3.2*  CL 114* 111 110  CO2 21 14* 14*  GLUCOSE 179* 100* 192*  BUN 31* 45* 51*  CREATININE 2.19* 2.40* 2.81*  CALCIUM 7.5* 8.1* 9.5  MG 1.2* -- --  PHOS -- -- --  Liver Function Tests: No results found for this basename: AST:5,ALT:5,ALKPHOS:5,BILITOT:5,PROT:5,ALBUMIN:5 in the last 168 hours No results found for this basename: LIPASE:5,AMYLASE:5 in the last 168 hours No results  found for this basename: AMMONIA:5 in the last 168 hours CBC:  Lab 03/25/12 0828 03/24/12 1600 03/24/12 0323 03/23/12 0955  WBC 9.7 -- 7.0 8.5  NEUTROABS 7.9* -- -- --  HGB 7.6* -- 8.2* 8.0*  HCT 21.5* -- 23.8* 23.9*  MCV 83.0 -- 84.1 86.3  PLT 40* 47* 44* 62*   Cardiac Enzymes:  Lab 03/25/12 0225 03/24/12 1840 03/23/12 0955  CKTOTAL 20 22 --  CKMB 1.5 1.7 --  CKMBINDEX -- -- --  TROPONINI <0.30 <0.30 <0.30   BNP (last 3 results)  Basename 03/24/12 1149 03/05/12 2100 02/22/12 1600  PROBNP 1538.0* 479.3* 1118.0*   CBG: No results found for this basename: GLUCAP:5 in the last 168 hours  Recent Results (from the past 240 hour(s))  TECHNOLOGIST REVIEW     Status: Normal   Collection Time   03/16/12 10:55 AM      Component Value Range Status Comment   Technologist Review Few Metas, Rouleaux Present   Final   MRSA PCR SCREENING     Status: Normal   Collection Time   03/23/12  2:37 PM      Component Value Range Status Comment   MRSA by PCR NEGATIVE  NEGATIVE Final   CULTURE, EXPECTORATED SPUTUM-ASSESSMENT     Status: Normal   Collection Time   03/23/12  3:33 PM      Component Value Range Status Comment   Specimen Description SPUTUM   Final    Special Requests NONE   Final    Sputum evaluation     Final    Value: THIS SPECIMEN IS ACCEPTABLE. RESPIRATORY CULTURE REPORT TO FOLLOW.   Report Status 03/23/2012 FINAL   Final   CULTURE, RESPIRATORY     Status: Normal (Preliminary result)   Collection Time   03/23/12  4:05 PM      Component Value Range Status Comment   Specimen Description SPUTUM   Final    Special Requests NONE   Final    Gram Stain     Final    Value: NO WBC SEEN     NO SQUAMOUS EPITHELIAL CELLS SEEN     NO ORGANISMS SEEN   Culture PENDING   Incomplete    Report Status PENDING   Incomplete      Studies: Dg Chest 2 View  03/23/2012  *RADIOLOGY REPORT*  Clinical Data: Cough.  Pleurisy.  Short of breath.  History of cancer.  CHEST - 2 VIEW  Comparison:  03/05/2012.  Findings: Progressive airspace disease is present in the right upper lobe, best seen on the lateral view.  When comparing lateral views to prior chest radiograph 03/05/2012, there is continued/progressive consolidation of the right upper lobe.  Support apparatus appears unchanged.  There is some faint airspace disease in the left midlung as well.  Cardiopericardial silhouette unchanged.  Right IJ power port appears similar.  IMPRESSION: Progressive consolidation of the right upper lobe, best seen on the lateral view.  Faint airspace opacity in the left midlung, which may also represent pneumonia.  Original Report Authenticated By: Andreas Newport, M.D.   Dg Chest 2 View  03/05/2012  *RADIOLOGY REPORT*  Clinical Data: Respiratory distress.  CHEST - 2 VIEW  Comparison: 02/27/2012.  Findings: The power port is stable.  The right upper lobe airspace process shows a slight decrease  and consolidation.  The remainder the lungs are clear.  IMPRESSION: Persistent but slight improved right upper lobe pneumonia.  Original Report Authenticated By: P. Loralie Champagne, M.D.   Dg Chest 2 View  02/27/2012  *RADIOLOGY REPORT*  Clinical Data: Cough, congestion.  CHEST - 2 VIEW  Comparison: 02/22/2012  Findings: Right Port-A-Cath remains in place, unchanged.  Dense consolidation in the right upper lobe is stable.  Left lung is clear.  Heart is normal size.  No effusions.  IMPRESSION: Stable dense right upper lobe consolidation.  Original Report Authenticated By: Cyndie Chime, M.D.   Ct Chest Wo Contrast  02/23/2012  *RADIOLOGY REPORT*  Clinical Data: Right lower lobe infiltrate.  Non Hodgkin lymphoma. Shortness of breath.  CT CHEST WITHOUT CONTRAST  Technique:  Multidetector CT imaging of the chest was performed following the standard protocol without IV contrast.  Comparison: PET 12/21/2011. CT chest 12/18/2008.  Findings: No pathologically enlarged mediastinal or axillary lymph nodes.  The hilar regions are  difficult to definitively evaluate without IV contrast.  Coronary artery calcification.  Heart size normal.  No pericardial effusion.  Emphysema.  Airspace consolidation with internal rounded lucencies involving the majority of the anterior and posterior segment of the right upper lobe.  Associated air bronchograms.  Additional consolidation is seen in the medial aspect of the right lower lobe, with at least one air-fluid level (image 30), possibly with a previous site of bullous disease, as seen on 12/18/2008. Additional scattered peribronchovascular nodularity and mild consolidation in the right lower lobe, inferiorly.  7 mm subpleural left upper lobe nodule (image 36) is unchanged from 12/18/2008, indicating a benign etiology.  No pleural fluid.  Airway is unremarkable.  Incidental imaging of the upper abdomen shows no acute findings. No worrisome lytic or sclerotic lesions.  IMPRESSION:  Right upper and right lower lobe pneumonia. Associated fluid in multiple bullous lesions in the right upper and right lower lobes.  Original Report Authenticated By: Reyes Ivan, M.D.   Dg Esophagus  03/08/2012  *RADIOLOGY REPORT*  Clinical Data: Dysphagia.  ESOPHOGRAM / BARIUM SWALLOW  Technique:  Combined double contrast and single contrast examination performed using effervescent crystals, thick barium liquid, and thin barium liquid.  Fluoroscopy time:  1.2 minutes.  Comparison: 02/23/2012  Findings:  The cervical and thoracic esophagus are patent.  No high- grade stricture or mass.  Normal motility of the esophagus.  The patient ingested a 13 mm barium tablet which easily passed through the esophagus and into the stomach.  No reflux identified.  IMPRESSION:  1.  Normal esophagram.  Original Report Authenticated By: Rosealee Albee, M.D.   Dg Abd 2 Views  02/26/2012  *RADIOLOGY REPORT*  Clinical Data: Nausea and vomiting.  Hodgkin's lymphoma.  COPD.  ABDOMEN - 2 VIEW  Comparison: PET of 12/21/2011.  Findings:  Upright view abdomen and a supine view of the abdomen and pelvis.  Upright view abdomen demonstrates no free intraperitoneal air or significant air fluid levels.  Interstitial thickening at the lung bases without lobar consolidation.  Supine view of the abdomen pelvis demonstrates a large colonic stool burden.  Distal gas.  No bowel distention.  Minimal convex right lumbar spine curvature.  IMPRESSION: Colonic stool burden suggests constipation.  No acute findings.  Original Report Authenticated By: Consuello Bossier, M.D.    Scheduled Meds:    . sodium chloride 0.9% NICU IV bolus  500 mL Intravenous Once  . acetaminophen  650 mg Oral Once  . benzonatate  100 mg Oral TID  . dextromethorphan-guaiFENesin  2 tablet Oral BID  . diphenhydrAMINE  25 mg Oral Once  . docusate sodium  100 mg Oral BID  . famotidine  20 mg Oral BID  . magnesium sulfate 1 - 4 g bolus IVPB  3 g Intravenous Once  . metoprolol      . metoprolol  2.5 mg Intravenous Once  . multivitamin with minerals  1 tablet Oral Daily  . piperacillin-tazobactam (ZOSYN)  IV  3.375 g Intravenous Q8H  . polyethylene glycol  17 g Oral BID  . potassium chloride SA  20 mEq Oral Daily  . potassium chloride  40 mEq Oral Q4H  . sodium chloride  1,000 mL Intravenous Once  . sodium chloride  500 mL Intravenous Once  . sodium chloride  500 mL Intravenous Once  . sodium chloride  3 mL Intravenous Q12H  . vancomycin  750 mg Intravenous Q24H  . DISCONTD: metoprolol  5 mg Intravenous Once  . DISCONTD: vancomycin  750 mg Intravenous Q48H   Continuous Infusions:    . dextrose    . DISCONTD:  sodium bicarbonate infusion 1/4 NS 1000 mL 125 mL/hr at 03/24/12 1845    Principal Problem:  *Sepsis Active Problems:  Hodgkin's lymphoma in relapse  COPD (chronic obstructive pulmonary disease)  Hyperlipidemia  Hypotension  Acute on chronic renal failure  Anemia  CKD (chronic kidney disease), stage III  Metabolic acidosis  SIRS (systemic  inflammatory response syndrome)  HCAP (healthcare-associated pneumonia)  Thrombocytopenia  Hypomagnesemia  Hypokalemia  Hypernatremia    Time spent: 60 mins    [facility]  [facility] Pager [phone]. If 8PM-8AM, please contact night-coverage at [email or web address], password [facility] 03/25/2012, 9:57 AM  LOS: 2 days

## 2012-03-25 NOTE — Progress Notes (Signed)
Keith Bell   DOB:04-08-1951   XL#:[id number]   ZDG#:[id number]  Subjective:  61 yo with hx of relapsed HD, s/p multiple chemo regimens, now on affintor, Hx of polymicrobial PNA on ongoing antimicrobial therapy. Admitted with FTT, sob, hiccups. Currently on vanc/zosyn.  Objective:  A+O x 3, no acute distress. C/o being cold Filed Vitals:   03/25/12 1100  BP: 115/61  Pulse: 103  Temp:   Resp: 12    Body mass index is 19.66 kg/(m^2).  Intake/Output Summary (Last 24 hours) at 03/25/12 1157 Last data filed at 03/25/12 1100  Gross per 24 hour  Intake   6870 ml  Output   4678 ml  Net   2192 ml     Sclerae unicteric  Oropharynx clear  No peripheral adenopathy  Lungs clear -- no rales or rhonchi, has lower lobe rales.  Heart regular rate and rhythm  Abdomen benign  MSK no focal spinal tenderness, no peripheral edema  Neuro nonfocal   CBG (last 3)  No results found for this basename: GLUCAP:3 in the last 72 hours   Labs:  Lab Results  Component Value Date   WBC 9.7 03/25/2012   HGB 7.6* 03/25/2012   HCT 21.5* 03/25/2012   MCV 83.0 03/25/2012   PLT 40* 03/25/2012   NEUTROABS 7.9* 03/25/2012    Urine Studies No results found for this basename: UACOL:2,UAPR:2,USPG:2,UPH:2,UTP:2,UGL:2,UKET:2,UBIL:2,UHGB:2,UNIT:2,UROB:2,ULEU:2,UEPI:2,UWBC:2,URBC:2,UBAC:2,CAST:2,CRYS:2,UCOM:2,BILUA:2 in the last 72 hours  Basic Metabolic Panel:  Lab 03/25/12 5956 03/24/12 0323 03/23/12 0955  NA 148* 140 143  K 2.8* 3.7 --  CL 114* 111 110  CO2 21 14* 14*  GLUCOSE 179* 100* 192*  BUN 31* 45* 51*  CREATININE 2.19* 2.40* 2.81*  CALCIUM 7.5* 8.1* 9.5  MG 1.2* -- --  PHOS -- -- --   GFR Estimated Creatinine Clearance: 28.2 ml/min (by C-G formula based on Cr of 2.19). Liver Function Tests: No results found for this basename: AST:5,ALT:5,ALKPHOS:5,BILITOT:5,PROT:5,ALBUMIN:5 in the last 168 hours No results found for this basename: LIPASE:5,AMYLASE:5 in the last 168 hours No results found  for this basename: AMMONIA:5 in the last 168 hours Coagulation profile  Lab 03/24/12 1600 03/23/12 0955  INR 1.24 1.16  PROTIME -- --    CBC:  Lab 03/25/12 0828 03/24/12 1600 03/24/12 0323 03/23/12 0955  WBC 9.7 -- 7.0 8.5  NEUTROABS 7.9* -- -- --  HGB 7.6* -- 8.2* 8.0*  HCT 21.5* -- 23.8* 23.9*  MCV 83.0 -- 84.1 86.3  PLT 40* 47* 44* 62*   Cardiac Enzymes:  Lab 03/25/12 0225 03/24/12 1840 03/23/12 0955  CKTOTAL 20 22 --  CKMB 1.5 1.7 --  CKMBINDEX -- -- --  TROPONINI <0.30 <0.30 <0.30   BNP: No components found with this basename: POCBNP:5 CBG: No results found for this basename: GLUCAP:5 in the last 168 hours D-Dimer  Basename 03/24/12 1600  DDIMER 2.70*   Hgb A1c No results found for this basename: HGBA1C:2 in the last 72 hours Lipid Profile No results found for this basename: CHOL:2,HDL:2,LDLCALC:2,TRIG:2,CHOLHDL:2,LDLDIRECT:2 in the last 72 hours Thyroid function studies No results found for this basename: TSH,T4TOTAL,FREET3,T3FREE,THYROIDAB in the last 72 hours Anemia work up No results found for this basename: VITAMINB12:2,FOLATE:2,FERRITIN:2,TIBC:2,IRON:2,RETICCTPCT:2 in the last 72 hours Microbiology Recent Results (from the past 240 hour(s))  TECHNOLOGIST REVIEW     Status: Normal   Collection Time   03/16/12 10:55 AM      Component Value Range Status Comment   Technologist Review Few Metas, Rouleaux Present   Final   CULTURE,  BLOOD (ROUTINE X 2)     Status: Normal (Preliminary result)   Collection Time   03/23/12 11:24 AM      Component Value Range Status Comment   Specimen Description BLOOD PORTA CATH   Final    Special Requests BOTTLES DRAWN AEROBIC AND ANAEROBIC 3CC   Final    Culture  Setup Time 03/24/2012 01:00   Final    Culture     Final    Value:        BLOOD CULTURE RECEIVED NO GROWTH TO DATE CULTURE WILL BE HELD FOR 5 DAYS BEFORE ISSUING A FINAL NEGATIVE REPORT   Report Status PENDING   Incomplete   CULTURE, BLOOD (ROUTINE X 2)     Status:  Normal (Preliminary result)   Collection Time   03/23/12 11:31 AM      Component Value Range Status Comment   Specimen Description BLOOD LEFT HAND   Final    Special Requests BOTTLES DRAWN AEROBIC AND ANAEROBIC 4CC   Final    Culture  Setup Time 03/24/2012 01:00   Final    Culture     Final    Value:        BLOOD CULTURE RECEIVED NO GROWTH TO DATE CULTURE WILL BE HELD FOR 5 DAYS BEFORE ISSUING A FINAL NEGATIVE REPORT   Report Status PENDING   Incomplete   MRSA PCR SCREENING     Status: Normal   Collection Time   03/23/12  2:37 PM      Component Value Range Status Comment   MRSA by PCR NEGATIVE  NEGATIVE Final   CULTURE, EXPECTORATED SPUTUM-ASSESSMENT     Status: Normal   Collection Time   03/23/12  3:33 PM      Component Value Range Status Comment   Specimen Description SPUTUM   Final    Special Requests NONE   Final    Sputum evaluation     Final    Value: THIS SPECIMEN IS ACCEPTABLE. RESPIRATORY CULTURE REPORT TO FOLLOW.   Report Status 03/23/2012 FINAL   Final   CULTURE, RESPIRATORY     Status: Normal (Preliminary result)   Collection Time   03/23/12  4:05 PM      Component Value Range Status Comment   Specimen Description SPUTUM   Final    Special Requests NONE   Final    Gram Stain     Final    Value: NO WBC SEEN     NO SQUAMOUS EPITHELIAL CELLS SEEN     NO ORGANISMS SEEN   Culture     Final    Value: FEW FUNGUS (MOLD) ISOLATED, PROBABLE CONTAMINANT/COLONIZER (SAPROPHYTE). CONTACT MICROBIOLOGY IF FURTHER IDENTIFICATION REQUIRED [phone].   Report Status PENDING   Incomplete       Studies:  Ct Chest Wo Contrast  03/24/2012  *RADIOLOGY REPORT*  Clinical Data: Recurrent pneumonia.  History of Hodgkin's lymphoma.  CT CHEST WITHOUT CONTRAST  Technique:  Multidetector CT imaging of the chest was performed following the standard protocol without IV contrast.  Comparison: Chest radiograph 03/23/2012, CT 02/23/2012, PET CT scan 12/21/2011  Findings: Compared to the CT of  02/23/2012, there is again demonstrated a dense pneumonia within the right upper lobe superimposed on severe central lobular emphysema.  This process involves more of the right upper lobe tahan prior and is now confluent throughout the right upper lobe and apex.  There is a new curvilinear gas collection posterior at the right lung apex which either represents a confluence  of the severe central emphysema or a pneumothorax.  No significant volume loss associated this process. The air bronchograms seen in the right middle lobe on prior exam are filled.(Image 24).  There is new nodular branching air space disease within the lingula (image 40).  Likewise in the posterior right lower lobe there is increase in interlobular septal thickening and mild air space disease.  There is small right effusion at the right lung base which is increased.  Central airways are normal.  There is a port in the right chest wall.  No axillary supraclavicular adenopathy.  Small mediastinal lymph nodes similar to prior.  Esophagus is thick-walled.  Heart appears normal this noncontrast exam.  Review of the upper abdomen is somewhat limited due to high density barium within the splenic flexure the colon.  The spleen appears normal.  Limited view of the skeleton is unremarkable.  IMPRESSION:  1.  Continued progression of severe consolidated pneumonia in the right upper lobe superimposed on severe central lobular emphysema. 2.  Some retraction of the pleural edge at the posterior right lung apex which either represents  consolidation of central lobular emphysema or small pneumothorax. 3.  There is new air space disease in the lingula with a peripheral nodular component which suggest progression of the same process in the right upper lobe. 4.  Mild air space disease in the right lower lobe.  Original Report Authenticated By: Genevive Bi, M.D.   US Renal  03/25/2012  *RADIOLOGY REPORT*  Clinical Data: Renal failure  RENAL/URINARY TRACT  ULTRASOUND COMPLETE  Comparison: PET CT scan 12/21/2011  Findings:  Right Kidney = 11.4 cm.  No evidence of hydronephrosis, cyst, mass, or stone.  Left kidney = 10.8 cm.  No evidence of hydronephrosis, cyst, mass, or stone.  Bladder:  Bladder partially distended.  No irregularity identified.  IMPRESSION:  Normal renal ultrasound.  Original Report Authenticated By: Genevive Bi, M.D.    Assessment: 61 y.o.  Stage 4 HD, s/p multiple regimens, now on affinitor, Hx polymicrobial PNA Admitted with FTT, sob  Now on iv abics, with pancytopenia,   Plan: anemia- due for prbc today Hypokalemia/hypomagnesemia-on replacement Currently off affinitor?  No code   Keith Bell 03/25/2012

## 2012-03-25 NOTE — Progress Notes (Signed)
Name: Keith Bell MRN: [id number] DOB: [birth year]    LOS: 2  Referring Provider: [facility] Reason for Referral:  Pna/ shock  PULMONARY / CRITICAL CARE MEDICINE  HPI: 76 yobm quit smoking x 30 days pta with Hodgkin's eval by [facility] during admit with last note 7/10 rec augmentin for necrotizing pna, rul, nos,  cxr better on readmit for sob 7/22-- 26 pccm not reconsulted and disharged on levquin but ??continued augmentin also  > readmit 8/9 with  3 days h/o cough with brown mucus and new pleuritic L ant cp, sob across the room and N and V but no h/o dysphagia or dental problems   Sleeping ok without nocturnal  or early am exacerbation  of respiratory  c/o's or need for noct saba. Also denies any obvious fluctuation of symptoms with weather or environmental changes or other aggravating or alleviating factors except as outlined above    Current Status: Step down, 30 deg hob, no 02, requiring fluid boluses for tachycardia with soft bp but cp and sob better, cough also better   Vital Signs: Temp:  [97.6 F (36.4 C)-98.5 F (36.9 C)] 98.2 F (36.8 C) (08/11 0400) Pulse Rate:  [88-142] 108  (08/11 0700) Resp:  [16-30] 20  (08/11 0700) BP: (76-111)/(40-76) 105/58 mmHg (08/11 0700) SpO2:  [91 %-100 %] 97 % (08/11 0700) Weight:  [122 lb 5.7 oz (55.5 kg)] 122 lb 5.7 oz (55.5 kg) (08/11 0400) FIO2:  RA  Physical Examination: General: chronically ill >>> stated age HEENT mild turbinate edema.  Oropharynx no thrush or excess pnd or cobblestoning.  No JVD or cervical adenopathy. Mild accessory muscle hypertrophy. Trachea midline, nl thryroid. Chest was hyperinflated by percussion with diminished breath sounds and moderate increased exp time without wheeze. Hoover sign positive at mid inspiration. Regular rate and rhythm without murmur gallop or rub or increase P2 or edema.  Abd: no hsm, nl excursion. Ext warm without cyanosis or clubbing.     Principal Problem:  *Sepsis Active  Problems:  Hodgkin's lymphoma in relapse  COPD (chronic obstructive pulmonary disease)  Hyperlipidemia  Hypotension  Acute on chronic renal failure  Anemia  CKD (chronic kidney disease), stage III  Metabolic acidosis  SIRS (systemic inflammatory response syndrome)  HCAP (healthcare-associated pneumonia)  Thrombocytopenia   ASSESSMENT AND PLAN  PULMONARY No results found for this basename: PHART:5,PCO2:5,PCO2ART:5,PO2ART:5,HCO3:5,O2SAT:5 in the last 168 hours CT chest 8/10 s contrast . Continued progression of severe consolidated pneumonia in the  right upper lobe superimposed on severe central lobular emphysema.  2. Some retraction of the pleural edge at the posterior right lung  apex which either represents consolidation of central lobular  emphysema or small pneumothorax.  3. There is new air space disease in the lingula with a peripheral  nodular component which suggest progression of the same process in  the right upper lobe.   A:   Recurrent pna in compromised host Rec see ID section Probably will need fob this admit    CARDIOVASCULAR  Lab 03/25/12 0225 03/24/12 1840 03/24/12 1149 03/23/12 1120 03/23/12 0955  TROPONINI <0.30 <0.30 -- -- <0.30  LATICACIDVEN -- -- 0.7 2.1 --  PROBNP -- -- 1538.0* -- --    Cortisol 7/23 >  30  A Probably vol depleted with low grade sepsis rec fluids, no pressors needed  RENAL  Lab 03/24/12 0323 03/23/12 0955  NA 140 143  K 3.7 3.2*  CL 111 110  CO2 14* 14*  BUN 45* 51*  CREATININE 2.40*  2.81*  CALCIUM 8.1* 9.5  MG -- --  PHOS -- --   Intake/Output      08/10 0701 - 08/11 0700 08/11 0701 - 08/12 0700   P.O. 1200    I.V. (mL/kg) 2850 (51.4)    Blood     IV Piggyback 3650    Total Intake(mL/kg) 7700 (138.7)    Urine (mL/kg/hr) 4225 (3.2)    Emesis/NG output 1    Stool 2    Total Output 4228    Net +3472         Urine Occurrence 1 x       A:  CRI with creat around 1.5 acutely worse with sepsis/dehyration        Metabolic Acidosis both NG(renal insuf) and AG component (cri/sepsis) P:  Agree with vol exp with [id number] in iv fluids to continue    GASTROINTESTINAL No results found for this basename: AST:5,ALT:5,ALKPHOS:5,BILITOT:5,PROT:5,ALBUMIN:5 in the last 168 hours  A:  No issues  HEMATOLOGIC  Lab 03/24/12 1600 03/24/12 0323 03/23/12 0955  HGB -- 8.2* 8.0*  HCT -- 23.8* 23.9*  PLT 47* 44* 62*  INR 1.24 -- 1.16  APTT 53* -- --   A: Anemia in setting of sepsis, keep hgb > 8 A Thrombocytopenia new since 7/26 ? Etiology eval per Heme/Onc   INFECTIOUS  Lab 03/24/12 1149 03/24/12 0323 03/23/12 0955  WBC -- 7.0 8.5  PROCALCITON 7.59 -- 13.51   Cultures: Sputum 8/9>> MRSA 8/9 > neg BC x 2 8/9 >>> Urine strep 8/9 > neg Urine Legionella 8/9 > neg  Antibiotics: Zosyn 8/9 >>> Vanc 8/9 >>>    A: HCAP P Agree with Z/V pending cultures  ENDOCRINE No results found for this basename: GLUCAP:5 in the last 168 hours A:  No issues  NEUROLOGIC  A: No issues  BEST PRACTICE / DISPOSITION Level of Care:  SD Primary Service:  [facility] Consultants:  Heme/onc Code Status:  full Diet:  Per [facility] DVT Px:  PAS (anemia, thrombocytopenia) GI Px:  h2 Skin Integrity:  ok Social / Family:  None at bedside  Question whether he really took the abx given and note confusion levaquin vs augmentin in records - he has a bottle of augmentin with 26/60 pills still in it from 7/15 and no bottle with levaquin in it though he says he takes a pill he didn't bring with him and no one at home to bring it in    Sandrea Hughs, M.D. Pulmonary and Critical Care Medicine [facility] Pager: [phone]  03/25/2012, 7:44 AM

## 2012-03-26 ENCOUNTER — Inpatient Hospital Stay (HOSPITAL_COMMUNITY): Payer: Medicare Other

## 2012-03-26 DIAGNOSIS — A419 Sepsis, unspecified organism: Secondary | ICD-10-CM

## 2012-03-26 LAB — CBC WITH DIFFERENTIAL/PLATELET
Eosinophils Relative: 6 % — ABNORMAL HIGH (ref 0–5)
Monocytes Relative: 5 % (ref 3–12)
Neutrophils Relative %: 83 % — ABNORMAL HIGH (ref 43–77)
Platelets: 40 10*3/uL — ABNORMAL LOW (ref 150–400)
RBC: 3.42 MIL/uL — ABNORMAL LOW (ref 4.22–5.81)
WBC: 8 10*3/uL (ref 4.0–10.5)

## 2012-03-26 LAB — BASIC METABOLIC PANEL
BUN: 27 mg/dL — ABNORMAL HIGH (ref 6–23)
Calcium: 7.8 mg/dL — ABNORMAL LOW (ref 8.4–10.5)
GFR calc Af Amer: 35 mL/min — ABNORMAL LOW (ref >90)
GFR calc non Af Amer: 30 mL/min — ABNORMAL LOW (ref >90)
Potassium: 3.4 mEq/L — ABNORMAL LOW (ref 3.5–5.1)
Sodium: 144 mEq/L (ref 135–145)

## 2012-03-26 LAB — TYPE AND SCREEN
ABO/RH(D): O POS
Antibody Screen: NEGATIVE
Unit division: 0

## 2012-03-26 LAB — CULTURE, RESPIRATORY W GRAM STAIN

## 2012-03-26 LAB — SEDIMENTATION RATE: Sed Rate: 118 mm/hr — ABNORMAL HIGH (ref 0–16)

## 2012-03-26 LAB — PROCALCITONIN: Procalcitonin: 5.61 ng/mL

## 2012-03-26 MED ORDER — ENSURE COMPLETE PO LIQD
237.0000 mL | Freq: Two times a day (BID) | ORAL | Status: DC
  Administered 2012-03-26 – 2012-04-05 (×20): 237 mL via ORAL

## 2012-03-26 MED ORDER — BENZONATATE 100 MG PO CAPS
200.0000 mg | ORAL_CAPSULE | Freq: Three times a day (TID) | ORAL | Status: DC
  Administered 2012-03-26 – 2012-04-05 (×30): 200 mg via ORAL
  Filled 2012-03-26 (×36): qty 2

## 2012-03-26 MED ORDER — POTASSIUM CHLORIDE CRYS ER 20 MEQ PO TBCR: 20.0000 meq | EXTENDED_RELEASE_TABLET | Freq: Once | ORAL | Status: DC

## 2012-03-26 MED ORDER — POTASSIUM CHLORIDE CRYS ER 20 MEQ PO TBCR
40.0000 meq | EXTENDED_RELEASE_TABLET | Freq: Once | ORAL | Status: AC
  Administered 2012-03-26: 40 meq via ORAL
  Filled 2012-03-26: qty 2

## 2012-03-26 MED ORDER — MEGESTROL ACETATE 400 MG/10ML PO SUSP
400.0000 mg | Freq: Two times a day (BID) | ORAL | Status: DC
  Administered 2012-03-26 (×2): 400 mg via ORAL
  Filled 2012-03-26 (×6): qty 10

## 2012-03-26 MED ORDER — HYDROCODONE-ACETAMINOPHEN 5-325 MG PO TABS
1.0000 | ORAL_TABLET | ORAL | Status: DC | PRN
  Administered 2012-03-26 (×2): 2 via ORAL
  Administered 2012-03-27 (×2): 1 via ORAL
  Administered 2012-03-27 – 2012-04-05 (×13): 2 via ORAL
  Filled 2012-03-26 (×5): qty 2
  Filled 2012-03-26: qty 1
  Filled 2012-03-26 (×2): qty 2
  Filled 2012-03-26: qty 1
  Filled 2012-03-26 (×9): qty 2

## 2012-03-26 MED ORDER — MEGESTROL ACETATE 40 MG PO TABS
400.0000 mg | ORAL_TABLET | Freq: Two times a day (BID) | ORAL | Status: DC
  Filled 2012-03-26 (×2): qty 10

## 2012-03-26 MED ORDER — FAMOTIDINE 20 MG PO TABS
20.0000 mg | ORAL_TABLET | Freq: Every day | ORAL | Status: DC
  Administered 2012-03-26 – 2012-03-28 (×3): 20 mg via ORAL
  Filled 2012-03-26 (×3): qty 1

## 2012-03-26 NOTE — Consult Note (Signed)
Infectious Diseases Initial Consultation  Reason for Consultation:  Antibiotic management.    HPI: Keith Bell is a 61 y.o. male with a history of Hodgkin's lymphoma and recent admission for presumed necrotizing pneumonia who prepresented on 8/9 with continued SOB and cough.  He did report worsening SOB and cough, though when I discuss this with him now, he states this has been ongoing and is not worse, just not better.  He was admitted and started on broad spectrum antibiotics with vanomycin and zosyn.  He has not been febrile and WBC wnl.  He did endorse some subjective fever and chills.  He tells me now, since admission, he has improved, but equates this to pain control.  His cough is a little better with the cough medicine and his SOB is unchanged.  He had been discharged on Augmentin and levaquin after his last admission, no positive cultures at the time.  He has had one positive sputum culture for saprophytic fungus, likely contaminate.  He did have a CT of his chest that shows a continued and progressive consolidation in the right upper lobe.  This has been persistent since. June and a recent PET scan in April also showed uptake of right hilar lymph nodes.    Past Medical History  Diagnosis Date  . Dyslipidemia 2005  . Cancer     stomach  . Hypertension     No longer on BP pills x 4 years  . Hodgkin's lymphoma   . CKD (chronic kidney disease), stage III   . Anemia     Allergies:  Allergies  Allergen Reactions  . Aspirin Nausea Only    Current antibiotics:   MEDICATIONS:    . benzonatate  200 mg Oral TID  . dextromethorphan-guaiFENesin  2 tablet Oral BID  . docusate sodium  100 mg Oral BID  . famotidine  20 mg Oral Daily  . magnesium sulfate 1 - 4 g bolus IVPB  3 g Intravenous Once  . megestrol  400 mg Oral BID  . multivitamin with minerals  1 tablet Oral Daily  . piperacillin-tazobactam (ZOSYN)  IV  3.375 g Intravenous Q8H  . potassium chloride SA  20 mEq Oral Daily    . potassium chloride  40 mEq Oral Q4H  . potassium chloride  40 mEq Oral Once  . sodium chloride  3 mL Intravenous Q12H  . vancomycin  750 mg Intravenous Q24H  . DISCONTD: benzonatate  100 mg Oral TID  . DISCONTD: famotidine  20 mg Oral BID  . DISCONTD: megestrol  400 mg Oral BID  . DISCONTD: polyethylene glycol  17 g Oral BID  . DISCONTD: potassium chloride  20 mEq Oral Once    History  Substance Use Topics  . Smoking status: Former Smoker -- 1.0 packs/day for 30 years    Quit date: 01/31/2012  . Smokeless tobacco: Never Used  . Alcohol Use: No    Family History  Problem Relation Age of Onset  . Heart disease Mother   . Heart disease Father   . Cancer Father     multiple myeloma  . Cancer Sister     breast cancer  . Cancer Sister     breast cancer    Review of Systems - Negative except as per the HPI  OBJECTIVE: Temp:  [97 F (36.1 C)-98.4 F (36.9 C)] 97.7 F (36.5 C) (08/12 0800) Pulse Rate:  [89-118] 106  (08/12 0900) Resp:  [18-31] 28  (08/12 0900) BP: (89-149)/(54-88) 101/59  mmHg (08/12 0900) SpO2:  [91 %-99 %] 96 % (08/12 1310) General appearance: alert, cooperative and no distress Resp: clear to auscultation bilaterally Cardio: regular rate and rhythm, S1, S2 normal, no murmur, click, rub or gallop GI: soft, non-tender; bowel sounds normal; no masses,  no organomegaly Extremities: extremities normal, atraumatic, no cyanosis or edema  LABS: Results for orders placed during the hospital encounter of 03/23/12 (from the past 48 hour(s))  DIC (DISSEMINATED INTRAVASCULAR COAGULATION) PANEL     Status: Abnormal   Collection Time   03/24/12  4:00 PM      Component Value Range Comment   Prothrombin Time 15.9 (*) 11.6 - 15.2 seconds    INR 1.24  0.00 - 1.49    aPTT 53 (*) 24 - 37 seconds    Fibrinogen >800 (*) 204 - 475 mg/dL    D-Dimer, Quant 1.61 (*) 0.00 - 0.48 ug/mL-FEU    Platelets 47 (*) 150 - 400 K/uL    Smear Review NO SCHISTOCYTES SEEN     CARDIAC  PANEL(CRET KIN+CKTOT+MB+TROPI)     Status: Normal   Collection Time   03/24/12  6:40 PM      Component Value Range Comment   Total CK 22  7 - 232 U/L    CK, MB 1.7  0.3 - 4.0 ng/mL    Troponin I <0.30  <0.30 ng/mL    Relative Index RELATIVE INDEX IS INVALID  0.0 - 2.5   OCCULT BLOOD X 1 CARD TO LAB, STOOL     Status: Normal   Collection Time   03/24/12 10:03 PM      Component Value Range Comment   Fecal Occult Bld NEGATIVE     CARDIAC PANEL(CRET KIN+CKTOT+MB+TROPI)     Status: Normal   Collection Time   03/25/12  2:25 AM      Component Value Range Comment   Total CK 20  7 - 232 U/L    CK, MB 1.5  0.3 - 4.0 ng/mL    Troponin I <0.30  <0.30 ng/mL    Relative Index RELATIVE INDEX IS INVALID  0.0 - 2.5   CBC WITH DIFFERENTIAL     Status: Abnormal   Collection Time   03/25/12  8:28 AM      Component Value Range Comment   WBC 9.7  4.0 - 10.5 K/uL    RBC 2.59 (*) 4.22 - 5.81 MIL/uL    Hemoglobin 7.6 (*) 13.0 - 17.0 g/dL    HCT 09.6 (*) 04.5 - 52.0 %    MCV 83.0  78.0 - 100.0 fL    MCH 29.3  26.0 - 34.0 pg    MCHC 35.3  30.0 - 36.0 g/dL    RDW 40.9 (*) 81.1 - 15.5 %    Platelets 40 (*) 150 - 400 K/uL    Neutrophils Relative 82 (*) 43 - 77 %    Lymphocytes Relative 5 (*) 12 - 46 %    Monocytes Relative 6  3 - 12 %    Eosinophils Relative 6 (*) 0 - 5 %    Basophils Relative 1  0 - 1 %    Neutro Abs 7.9 (*) 1.7 - 7.7 K/uL    Lymphs Abs 0.5 (*) 0.7 - 4.0 K/uL    Monocytes Absolute 0.6  0.1 - 1.0 K/uL    Eosinophils Absolute 0.6  0.0 - 0.7 K/uL    Basophils Absolute 0.1  0.0 - 0.1 K/uL    RBC Morphology TARGET CELLS  WBC Morphology MILD LEFT SHIFT (1-5% METAS, OCC MYELO, OCC BANDS)     BASIC METABOLIC PANEL     Status: Abnormal   Collection Time   03/25/12  8:28 AM      Component Value Range Comment   Sodium 148 (*) 135 - 145 mEq/L    Potassium 2.8 (*) 3.5 - 5.1 mEq/L    Chloride 114 (*) 96 - 112 mEq/L    CO2 21  19 - 32 mEq/L    Glucose, Bld 179 (*) 70 - 99 mg/dL    BUN 31  (*) 6 - 23 mg/dL    Creatinine, Ser 5.62 (*) 0.50 - 1.35 mg/dL    Calcium 7.5 (*) 8.4 - 10.5 mg/dL    GFR calc non Af Amer 31 (*) >90 mL/min    GFR calc Af Amer 36 (*) >90 mL/min   MAGNESIUM     Status: Abnormal   Collection Time   03/25/12  8:28 AM      Component Value Range Comment   Magnesium 1.2 (*) 1.5 - 2.5 mg/dL   PROCALCITONIN     Status: Normal   Collection Time   03/25/12  8:28 AM      Component Value Range Comment   Procalcitonin 6.20     PREPARE RBC (CROSSMATCH)     Status: Normal   Collection Time   03/25/12 10:00 AM      Component Value Range Comment   Order Confirmation ORDER PROCESSED BY BLOOD BANK     VITAMIN B12     Status: Abnormal   Collection Time   03/25/12 10:54 AM      Component Value Range Comment   Vitamin B-12 >2000 (*) 211 - 911 pg/mL   FOLATE     Status: Normal   Collection Time   03/25/12 10:54 AM      Component Value Range Comment   Folate 5.7     IRON AND TIBC     Status: Abnormal   Collection Time   03/25/12 10:54 AM      Component Value Range Comment   Iron 10 (*) 42 - 135 ug/dL    TIBC 78 (*) 130 - 865 ug/dL    Saturation Ratios 13 (*) 20 - 55 %    UIBC 68 (*) 125 - 400 ug/dL   FERRITIN     Status: Abnormal   Collection Time   03/25/12 10:54 AM      Component Value Range Comment   Ferritin 3967 (*) 22 - 322 ng/mL Result confirmed by automatic dilution.  CARDIAC PANEL(CRET KIN+CKTOT+MB+TROPI)     Status: Normal   Collection Time   03/25/12  8:55 PM      Component Value Range Comment   Total CK 24  7 - 232 U/L    CK, MB 1.7  0.3 - 4.0 ng/mL    Troponin I <0.30  <0.30 ng/mL    Relative Index RELATIVE INDEX IS INVALID  0.0 - 2.5   CBC     Status: Abnormal   Collection Time   03/25/12  8:56 PM      Component Value Range Comment   WBC 10.1  4.0 - 10.5 K/uL    RBC 3.82 (*) 4.22 - 5.81 MIL/uL    Hemoglobin 10.8 (*) 13.0 - 17.0 g/dL    HCT 78.4 (*) 69.6 - 52.0 %    MCV 80.9  78.0 - 100.0 fL    MCH 28.3  26.0 - 34.0 pg  MCHC 35.0  30.0 -  36.0 g/dL    RDW 16.1 (*) 09.6 - 15.5 %    Platelets 43 (*) 150 - 400 K/uL CONSISTENT WITH PREVIOUS RESULT  BASIC METABOLIC PANEL     Status: Abnormal   Collection Time   03/25/12  8:56 PM      Component Value Range Comment   Sodium 145  135 - 145 mEq/L    Potassium 3.4 (*) 3.5 - 5.1 mEq/L    Chloride 111  96 - 112 mEq/L    CO2 19  19 - 32 mEq/L    Glucose, Bld 131 (*) 70 - 99 mg/dL    BUN 28 (*) 6 - 23 mg/dL    Creatinine, Ser 0.45 (*) 0.50 - 1.35 mg/dL    Calcium 7.7 (*) 8.4 - 10.5 mg/dL    GFR calc non Af Amer 33 (*) >90 mL/min    GFR calc Af Amer 38 (*) >90 mL/min   PROCALCITONIN     Status: Normal   Collection Time   03/26/12  5:15 AM      Component Value Range Comment   Procalcitonin 5.61     SEDIMENTATION RATE     Status: Abnormal   Collection Time   03/26/12  5:15 AM      Component Value Range Comment   Sed Rate 118 (*) 0 - 16 mm/hr   CBC WITH DIFFERENTIAL     Status: Abnormal   Collection Time   03/26/12  5:15 AM      Component Value Range Comment   WBC 8.0  4.0 - 10.5 K/uL    RBC 3.42 (*) 4.22 - 5.81 MIL/uL    Hemoglobin 9.7 (*) 13.0 - 17.0 g/dL    HCT 40.9 (*) 81.1 - 52.0 %    MCV 81.0  78.0 - 100.0 fL    MCH 28.4  26.0 - 34.0 pg    MCHC 35.0  30.0 - 36.0 g/dL    RDW 91.4 (*) 78.2 - 15.5 %    Platelets 40 (*) 150 - 400 K/uL CONSISTENT WITH PREVIOUS RESULT   Neutrophils Relative 83 (*) 43 - 77 %    Lymphocytes Relative 6 (*) 12 - 46 %    Monocytes Relative 5  3 - 12 %    Eosinophils Relative 6 (*) 0 - 5 %    Basophils Relative 0  0 - 1 %    Neutro Abs 6.6  1.7 - 7.7 K/uL    Lymphs Abs 0.5 (*) 0.7 - 4.0 K/uL    Monocytes Absolute 0.4  0.1 - 1.0 K/uL    Eosinophils Absolute 0.5  0.0 - 0.7 K/uL    Basophils Absolute 0.0  0.0 - 0.1 K/uL    RBC Morphology TARGET CELLS      WBC Morphology MILD LEFT SHIFT (1-5% METAS, OCC MYELO, OCC BANDS)      Smear Review PLATELET COUNT CONFIRMED BY SMEAR   LARGE PLATELETS PRESENT  BASIC METABOLIC PANEL     Status: Abnormal    Collection Time   03/26/12  5:15 AM      Component Value Range Comment   Sodium 144  135 - 145 mEq/L    Potassium 3.4 (*) 3.5 - 5.1 mEq/L    Chloride 112  96 - 112 mEq/L    CO2 19  19 - 32 mEq/L    Glucose, Bld 154 (*) 70 - 99 mg/dL    BUN 27 (*) 6 - 23 mg/dL  Creatinine, Ser 2.21 (*) 0.50 - 1.35 mg/dL    Calcium 7.8 (*) 8.4 - 10.5 mg/dL    GFR calc non Af Amer 30 (*) >90 mL/min    GFR calc Af Amer 35 (*) >90 mL/min   MAGNESIUM     Status: Normal   Collection Time   03/26/12  5:15 AM      Component Value Range Comment   Magnesium 1.9  1.5 - 2.5 mg/dL     MICRO:  IMAGING: Ct Chest Wo Contrast  03/24/2012  *RADIOLOGY REPORT*  Clinical Data: Recurrent pneumonia.  History of Hodgkin's lymphoma.  CT CHEST WITHOUT CONTRAST  Technique:  Multidetector CT imaging of the chest was performed following the standard protocol without IV contrast.  Comparison: Chest radiograph 03/23/2012, CT 02/23/2012, PET CT scan 12/21/2011  Findings: Compared to the CT of 02/23/2012, there is again demonstrated a dense pneumonia within the right upper lobe superimposed on severe central lobular emphysema.  This process involves more of the right upper lobe tahan prior and is now confluent throughout the right upper lobe and apex.  There is a new curvilinear gas collection posterior at the right lung apex which either represents a confluence of the severe central emphysema or a pneumothorax.  No significant volume loss associated this process. The air bronchograms seen in the right middle lobe on prior exam are filled.(Image 24).  There is new nodular branching air space disease within the lingula (image 40).  Likewise in the posterior right lower lobe there is increase in interlobular septal thickening and mild air space disease.  There is small right effusion at the right lung base which is increased.  Central airways are normal.  There is a port in the right chest wall.  No axillary supraclavicular adenopathy.  Small  mediastinal lymph nodes similar to prior.  Esophagus is thick-walled.  Heart appears normal this noncontrast exam.  Review of the upper abdomen is somewhat limited due to high density barium within the splenic flexure the colon.  The spleen appears normal.  Limited view of the skeleton is unremarkable.  IMPRESSION:  1.  Continued progression of severe consolidated pneumonia in the right upper lobe superimposed on severe central lobular emphysema. 2.  Some retraction of the pleural edge at the posterior right lung apex which either represents  consolidation of central lobular emphysema or small pneumothorax. 3.  There is new air space disease in the lingula with a peripheral nodular component which suggest progression of the same process in the right upper lobe. 4.  Mild air space disease in the right lower lobe.  Original Report Authenticated By: Genevive Bi, M.D.   US Renal  03/25/2012  *RADIOLOGY REPORT*  Clinical Data: Renal failure  RENAL/URINARY TRACT ULTRASOUND COMPLETE  Comparison: PET CT scan 12/21/2011  Findings:  Right Kidney = 11.4 cm.  No evidence of hydronephrosis, cyst, mass, or stone.  Left kidney = 10.8 cm.  No evidence of hydronephrosis, cyst, mass, or stone.  Bladder:  Bladder partially distended.  No irregularity identified.  IMPRESSION:  Normal renal ultrasound.  Original Report Authenticated By: Genevive Bi, M.D.   Dg Chest Port 1 View  03/26/2012  *RADIOLOGY REPORT*  Clinical Data: Pneumonia.  PORTABLE CHEST - 1 VIEW  Comparison: 03/24/2012.  03/23/2012.  Findings: Compared to the prior chest radiograph, there is worsening aeration at the bases, there is worsening aeration at the bases, likely representing pulmonary edema superimposed on the right upper lobe consolidation.  Right IJ power port is  unchanged. Mediastinal contours appear within normal limits.  There is no plain film evidence of pneumothorax.  IMPRESSION: 1.  Unchanged support apparatus. 2.  Persistent right upper  lobe consolidation. 3.  Development of airspace disease at both lung bases.  This is favored to represent edema however aspiration or multifocal pneumonia are in the differential considerations.  Original Report Authenticated By: Andreas Newport, M.D.    HISTORICAL MICRO/IMAGING  Assessment/Plan:    1) lung consolidation - persistent findings, no significant signs of infection with nl WBC, no fever.  Certainly he is at risk for fungal and other indolent infections but also could be c/w non - infectious etiology.  Will continue to monitor.  I will stop the vancomycin for now, continue zosyn.

## 2012-03-26 NOTE — Progress Notes (Signed)
[facility] PROGRESS NOTE  Keith Bell:[id number] DOB: Jan 05, 1951 DOA: 03/23/2012 PCP: Eino Farber, MD  Assessment/Plan: Principal Problem:  *Sepsis Active Problems:  Hodgkin's lymphoma in relapse  COPD (chronic obstructive pulmonary disease)  Hyperlipidemia  Hypotension  Acute on chronic renal failure  Anemia  CKD (chronic kidney disease), stage III  Metabolic acidosis  SIRS (systemic inflammatory response syndrome)  HCAP (healthcare-associated pneumonia)  Thrombocytopenia  Hypomagnesemia  Hypokalemia  Hypernatremia  #1 hypotension Likely secondary to sepsis secondary to recurrent probable necrotizing pneumonia/healthcare associated pneumonia. Patient with systolic blood pressure in the 80s overnight. Systolic blood pressure improved.  Pro calcitonin was elevated at 13 on admission decreasing and now at 5.61. Repeat pro calcitonin levels in AM.  Sputum Gram stain and cultures pending. Blood cultures are pending. Urine Legionella is negative. Urine strep pneumococcus is negative. Continue IV fluids and empiric antibiotics. PCCM following and appreciate input and recommendations. Decrease IVF to 100 cc/hr.  #2 recurrent necrotizing pneumonia/healthcare associated pneumonia This is patient's third hospitalization for right upper lobe pneumonia first episode in June of 2013 second episode in July of 2013 with patient was diagnosed with a necrotizing pneumonia per pulmonary medicine. Patient was discharged home on Levaquin and augmentin for 3 weeks, however concern as to whether patient took full course of antibiotics. Patient is presenting with productive cough, chills, fever, chest x-ray consistent with worsening pneumonia. Sputum Gram stain and cultures are pending. Urine strep pneumococcus is negative. A urine Legionella is negative. Pro calcitonin levels were elevated at 13 on admission and now at 5.61 and trending down. Lactic acid level was 2.1 on admission and  trending down. Will repeat pro calcitonin in the a.m. Repeat CT of the chest with continued progression of severe consolidated pneumonia in the right upper lobe superimposed on severe centrilobular emphysema and also do a space disease in the lingula with a peripheral nodular component suggestive of a retinal same process in the right upper lobe, mild airspace disease in the right lower lobe. Pulmonary critical care is following and recommend probable bronchoscopy. Continue empiric IV Zosyn and vancomycin. Nebs as needed. Mucinex. Tessalon Perles. Pulmonary and critical care following. We'll consult with infectious diseases for further evaluation and management.  #3 sepsis Likely secondary to problem #2. Urinalysis is negative. Chest x-ray consistent with recurrent worsening pneumonia. CT of the chest with multilobar and pneumonia and progression of worsening recurrent right upper lobe pneumonia .Blood cultures are pending. Pro calcitonin level was elevated on admission at 13 and trending down. Lactic acid level was 2.1 and trending down. Urine strep pneumococcus is negative. Urine Legionella is negative. Repeat pro calcitonin levels in the a.m. Continue empiric IV vancomycin and Zosyn. Pulmonary and critical care medicine ff.  #4 acute on chronic kidney disease stage III Patient's baseline creatinine is approximately 1.4-1.7. Creatinine on admission was 2.8. Likely secondary to a prerenal azotemia. Creatinine improving with hydration and currently at 2.21 and plateauing. Renal ultrasound normal. Continue supportive care follow. Decrease IV fluids 100 cc per hour and follow.  #5 thrombocytopenia/Anemia Likely secondary to problem #3 and afinitor. Patient is status post 3 unit of packed red blood cells. Afinitor has been discontinued. Hgb is now 9.7 from 7.6. LDH WNL.  Haptoglobin elevated at 409. Peripheral smear negative for schistocytes, however with elevated d dimer and fibrinogen. Doubt if in DIC.  Hematology/oncology ff and appreciate input and rxcs.  #6 COPD Stable. Nebs when necessary.  #7 metabolic acidosis Likely secondary to sepsis versus acute on  chronic kidney disease stage III. Improved with HCO3 in IVF. D/C'd HCO3 yesterday. Follow. See problem #3.  #8 hypomagnesemia/hypokalemia Replete.  #9 hypernatremia Improved with change of IV fluids.   #10 constipation Result with MiraLAX. Colace 100 mg twice a day.   #11 Hodgkin's lymphoma, relapse Afinitor has been discontinued secondary to his anemia, thrombocytopenia, sepsis. Hematology oncology is following.  #12 prophylaxis Pepcid for GI prophylaxis. SCDs for DVT prophylaxis.  #13: Code Status Discussed CODE STATUS with patient after reviewing Dr. Mamie Levers note from 03/17/2011. Patient is in agreement with a Limited code. No CPR. No mechanical ventilation. May use pressors or antiarrhythmics.   Code Status: Limited code. Family Communication: Updated patient and sister via telephone. Updated patient at bedside.  Disposition Plan: Transfer to step down unit. Home when medically stable.   Brief narrative: Very pleasant 61 yo with hx copd, hodgkins lymphoma, necrotizing pna presents to ED cc sob and cough. Information provided by painted. States symptoms began 3 days ago have persisted and worsened. Associated symptoms include chills, subjective fever, pleuritic chest pain and sinus congestion as well as some nausea and 3 episodes of vomiting. Reports emesis clear without dk coffee ground substances. Denies dysuria/hematuria abdominal pain or diarrhea. In ED pt was tachycardic, hypotensive and chest xray concerning for pna. Of note pt was discharged from hospital 7/26 after 5 day stay for similar symptoms. Was discharge on levaquin to be taken for 3 week. [facility] asked to admit      Consultants:  Pulmonary: Dr Sherene Sires 03/24/12   Hematolgy/Oncology: Dr Darrold Span 03/24/12  Procedures:  CXR  03/23/12  Status post 1 unit of  packed red blood cells on 03/23/2012  V/Q scan 03/24/12  CT Chest 03/24/12  Renal US 03/24/12  2 units PRBC  03/25/12  Antibiotics:  IV Vancomycin 03/23/12  IV Zosyn 03/23/12  HPI/Subjective: Patient complaining of cough and pleuritic chest pain secondary to cough. Patient states hiccups improved. Patient had a large bowel movement yesterday. Overnight patient with tachycardia with heart rates in the 110s and borderline blood pressure with systolics in the high 80s. Systolic Blood pressure currently greater than 100.  Heart rate is now in the 110s. Patient is mentating well.  Objective: Filed Vitals:   03/26/12 0500 03/26/12 0600 03/26/12 0700 03/26/12 0800  BP: 97/62 111/63 107/60 121/67  Pulse: 104 98 104 113  Temp:      TempSrc:      Resp: 22 23 25 26   Height:      Weight:      SpO2: 96% 94% 94% 92%    Intake/Output Summary (Last 24 hours) at 03/26/12 0857 Last data filed at 03/26/12 0800  Gross per 24 hour  Intake   5021 ml  Output   2400 ml  Net   2621 ml    Exam: General: Alert, awake, oriented x3, in no acute distress. Coughing. HEENT: No bruits, no goiter. Heart: Tachycardia, without murmurs, rubs, gallops. Lungs: Coarse breath sounds diet diffusely, diffuse rhonchi. Abdomen: Soft, nontender, nondistended, positive bowel sounds. Extremities: No clubbing cyanosis or edema with positive pedal pulses. Neuro: Grossly intact, nonfocal.  Data Reviewed: Basic Metabolic Panel:  Lab 03/26/12 0981 03/25/12 2056 03/25/12 0828 03/24/12 0323 03/23/12 0955  NA 144 145 148* 140 143  K 3.4* 3.4* 2.8* 3.7 3.2*  CL 112 111 114* 111 110  CO2 19 19 21  14* 14*  GLUCOSE 154* 131* 179* 100* 192*  BUN 27* 28* 31* 45* 51*  CREATININE 2.21* 2.10* 2.19* 2.40*  2.81*  CALCIUM 7.8* 7.7* 7.5* 8.1* 9.5  MG 1.9 -- 1.2* -- --  PHOS -- -- -- -- --   Liver Function Tests: No results found for this basename: AST:5,ALT:5,ALKPHOS:5,BILITOT:5,PROT:5,ALBUMIN:5 in the last 168 hours No  results found for this basename: LIPASE:5,AMYLASE:5 in the last 168 hours No results found for this basename: AMMONIA:5 in the last 168 hours CBC:  Lab 03/26/12 0515 03/25/12 2056 03/25/12 0828 03/24/12 1600 03/24/12 0323 03/23/12 0955  WBC 8.0 10.1 9.7 -- 7.0 8.5  NEUTROABS 6.6 -- 7.9* -- -- --  HGB 9.7* 10.8* 7.6* -- 8.2* 8.0*  HCT 27.7* 30.9* 21.5* -- 23.8* 23.9*  MCV 81.0 80.9 83.0 -- 84.1 86.3  PLT 40* 43* 40* 47* 44* --   Cardiac Enzymes:  Lab 03/25/12 2055 03/25/12 0225 03/24/12 1840 03/23/12 0955  CKTOTAL 24 20 22  --  CKMB 1.7 1.5 1.7 --  CKMBINDEX -- -- -- --  TROPONINI <0.30 <0.30 <0.30 <0.30   BNP (last 3 results)  Basename 03/24/12 1149 03/05/12 2100 02/22/12 1600  PROBNP 1538.0* 479.3* 1118.0*   CBG: No results found for this basename: GLUCAP:5 in the last 168 hours  Recent Results (from the past 240 hour(s))  TECHNOLOGIST REVIEW     Status: Normal   Collection Time   03/16/12 10:55 AM      Component Value Range Status Comment   Technologist Review Few Metas, Rouleaux Present   Final   CULTURE, BLOOD (ROUTINE X 2)     Status: Normal (Preliminary result)   Collection Time   03/23/12 11:24 AM      Component Value Range Status Comment   Specimen Description BLOOD PORTA CATH   Final    Special Requests BOTTLES DRAWN AEROBIC AND ANAEROBIC 3CC   Final    Culture  Setup Time 03/24/2012 01:00   Final    Culture     Final    Value:        BLOOD CULTURE RECEIVED NO GROWTH TO DATE CULTURE WILL BE HELD FOR 5 DAYS BEFORE ISSUING A FINAL NEGATIVE REPORT   Report Status PENDING   Incomplete   CULTURE, BLOOD (ROUTINE X 2)     Status: Normal (Preliminary result)   Collection Time   03/23/12 11:31 AM      Component Value Range Status Comment   Specimen Description BLOOD LEFT HAND   Final    Special Requests BOTTLES DRAWN AEROBIC AND ANAEROBIC 4CC   Final    Culture  Setup Time 03/24/2012 01:00   Final    Culture     Final    Value:        BLOOD CULTURE RECEIVED NO GROWTH TO  DATE CULTURE WILL BE HELD FOR 5 DAYS BEFORE ISSUING A FINAL NEGATIVE REPORT   Report Status PENDING   Incomplete   MRSA PCR SCREENING     Status: Normal   Collection Time   03/23/12  2:37 PM      Component Value Range Status Comment   MRSA by PCR NEGATIVE  NEGATIVE Final   CULTURE, EXPECTORATED SPUTUM-ASSESSMENT     Status: Normal   Collection Time   03/23/12  3:33 PM      Component Value Range Status Comment   Specimen Description SPUTUM   Final    Special Requests NONE   Final    Sputum evaluation     Final    Value: THIS SPECIMEN IS ACCEPTABLE. RESPIRATORY CULTURE REPORT TO FOLLOW.   Report Status 03/23/2012 FINAL  Final   CULTURE, RESPIRATORY     Status: Normal   Collection Time   03/23/12  4:05 PM      Component Value Range Status Comment   Specimen Description SPUTUM   Final    Special Requests NONE   Final    Gram Stain     Final    Value: NO WBC SEEN     NO SQUAMOUS EPITHELIAL CELLS SEEN     NO ORGANISMS SEEN   Culture     Final    Value: FEW FUNGUS (MOLD) ISOLATED, PROBABLE CONTAMINANT/COLONIZER (SAPROPHYTE). CONTACT MICROBIOLOGY IF FURTHER IDENTIFICATION REQUIRED [phone].   Report Status 03/26/2012 FINAL   Final      Studies: Dg Chest 2 View  03/23/2012  *RADIOLOGY REPORT*  Clinical Data: Cough.  Pleurisy.  Short of breath.  History of cancer.  CHEST - 2 VIEW  Comparison: 03/05/2012.  Findings: Progressive airspace disease is present in the right upper lobe, best seen on the lateral view.  When comparing lateral views to prior chest radiograph 03/05/2012, there is continued/progressive consolidation of the right upper lobe.  Support apparatus appears unchanged.  There is some faint airspace disease in the left midlung as well.  Cardiopericardial silhouette unchanged.  Right IJ power port appears similar.  IMPRESSION: Progressive consolidation of the right upper lobe, best seen on the lateral view.  Faint airspace opacity in the left midlung, which may also represent  pneumonia.  Original Report Authenticated By: Andreas Newport, M.D.   Dg Chest 2 View  03/05/2012  *RADIOLOGY REPORT*  Clinical Data: Respiratory distress.  CHEST - 2 VIEW  Comparison: 02/27/2012.  Findings: The power port is stable.  The right upper lobe airspace process shows a slight decrease and consolidation.  The remainder the lungs are clear.  IMPRESSION: Persistent but slight improved right upper lobe pneumonia.  Original Report Authenticated By: P. Loralie Champagne, M.D.   Dg Chest 2 View  02/27/2012  *RADIOLOGY REPORT*  Clinical Data: Cough, congestion.  CHEST - 2 VIEW  Comparison: 02/22/2012  Findings: Right Port-A-Cath remains in place, unchanged.  Dense consolidation in the right upper lobe is stable.  Left lung is clear.  Heart is normal size.  No effusions.  IMPRESSION: Stable dense right upper lobe consolidation.  Original Report Authenticated By: Cyndie Chime, M.D.   Ct Chest Wo Contrast  02/23/2012  *RADIOLOGY REPORT*  Clinical Data: Right lower lobe infiltrate.  Non Hodgkin lymphoma. Shortness of breath.  CT CHEST WITHOUT CONTRAST  Technique:  Multidetector CT imaging of the chest was performed following the standard protocol without IV contrast.  Comparison: PET 12/21/2011. CT chest 12/18/2008.  Findings: No pathologically enlarged mediastinal or axillary lymph nodes.  The hilar regions are difficult to definitively evaluate without IV contrast.  Coronary artery calcification.  Heart size normal.  No pericardial effusion.  Emphysema.  Airspace consolidation with internal rounded lucencies involving the majority of the anterior and posterior segment of the right upper lobe.  Associated air bronchograms.  Additional consolidation is seen in the medial aspect of the right lower lobe, with at least one air-fluid level (image 30), possibly with a previous site of bullous disease, as seen on 12/18/2008. Additional scattered peribronchovascular nodularity and mild consolidation in the right  lower lobe, inferiorly.  7 mm subpleural left upper lobe nodule (image 36) is unchanged from 12/18/2008, indicating a benign etiology.  No pleural fluid.  Airway is unremarkable.  Incidental imaging of the upper abdomen shows no acute findings. No  worrisome lytic or sclerotic lesions.  IMPRESSION:  Right upper and right lower lobe pneumonia. Associated fluid in multiple bullous lesions in the right upper and right lower lobes.  Original Report Authenticated By: Reyes Ivan, M.D.   Dg Esophagus  03/08/2012  *RADIOLOGY REPORT*  Clinical Data: Dysphagia.  ESOPHOGRAM / BARIUM SWALLOW  Technique:  Combined double contrast and single contrast examination performed using effervescent crystals, thick barium liquid, and thin barium liquid.  Fluoroscopy time:  1.2 minutes.  Comparison: 02/23/2012  Findings:  The cervical and thoracic esophagus are patent.  No high- grade stricture or mass.  Normal motility of the esophagus.  The patient ingested a 13 mm barium tablet which easily passed through the esophagus and into the stomach.  No reflux identified.  IMPRESSION:  1.  Normal esophagram.  Original Report Authenticated By: Rosealee Albee, M.D.   Dg Abd 2 Views  02/26/2012  *RADIOLOGY REPORT*  Clinical Data: Nausea and vomiting.  Hodgkin's lymphoma.  COPD.  ABDOMEN - 2 VIEW  Comparison: PET of 12/21/2011.  Findings: Upright view abdomen and a supine view of the abdomen and pelvis.  Upright view abdomen demonstrates no free intraperitoneal air or significant air fluid levels.  Interstitial thickening at the lung bases without lobar consolidation.  Supine view of the abdomen pelvis demonstrates a large colonic stool burden.  Distal gas.  No bowel distention.  Minimal convex right lumbar spine curvature.  IMPRESSION: Colonic stool burden suggests constipation.  No acute findings.  Original Report Authenticated By: Consuello Bossier, M.D.    Scheduled Meds:    . acetaminophen  650 mg Oral Once  . benzonatate  200  mg Oral TID  . dextromethorphan-guaiFENesin  2 tablet Oral BID  . diphenhydrAMINE  25 mg Oral Once  . docusate sodium  100 mg Oral BID  . famotidine  20 mg Oral BID  . magnesium sulfate 1 - 4 g bolus IVPB  3 g Intravenous Once  . multivitamin with minerals  1 tablet Oral Daily  . piperacillin-tazobactam (ZOSYN)  IV  3.375 g Intravenous Q8H  . potassium chloride SA  20 mEq Oral Daily  . potassium chloride  40 mEq Oral Q4H  . potassium chloride  40 mEq Oral Once  . sodium chloride  3 mL Intravenous Q12H  . vancomycin  750 mg Intravenous Q24H  . DISCONTD: benzonatate  100 mg Oral TID  . DISCONTD: polyethylene glycol  17 g Oral BID  . DISCONTD: potassium chloride  20 mEq Oral Once  . DISCONTD: vancomycin  750 mg Intravenous Q48H   Continuous Infusions:    . dextrose 125 mL/hr at 03/25/12 1030  . DISCONTD:  sodium bicarbonate infusion 1/4 NS 1000 mL 125 mL/hr at 03/24/12 1845    Principal Problem:  *Sepsis Active Problems:  Hodgkin's lymphoma in relapse  COPD (chronic obstructive pulmonary disease)  Hyperlipidemia  Hypotension  Acute on chronic renal failure  Anemia  CKD (chronic kidney disease), stage III  Metabolic acidosis  SIRS (systemic inflammatory response syndrome)  HCAP (healthcare-associated pneumonia)  Thrombocytopenia  Hypomagnesemia  Hypokalemia  Hypernatremia    Time spent: 40 mins    [facility]  [facility] Pager [phone]. If 8PM-8AM, please contact night-coverage at [email or web address], password [facility] 03/26/2012, 8:57 AM  LOS: 3 days

## 2012-03-26 NOTE — Progress Notes (Signed)
CARE MANAGEMENT NOTE 03/26/2012  Patient:  ELGIN, CARN   Account Number:  [id number]  Date Initiated:  03/26/2012  Documentation initiated by:  Acen Craun  Subjective/Objective Assessment:   patient iwth reoccurance of HD, pna in [place], continues to be on abx for this, admitted on [id number], with hgb 7.9, acidosis     Action/Plan:   lives at home   Anticipated DC Date:  03/29/2012   Anticipated DC Plan:  HOME/SELF CARE  In-house referral  NA      DC Planning Services  NA      [facility] Choice  NA   Choice offered to / List presented to:  NA   DME arranged  NA      DME agency  NA     HH arranged  NA      HH agency  NA   Status of service:  In process, will continue to follow Medicare Important Message given?  NA - LOS <3 / Initial given by admissions (If response is "NO", the following Medicare IM given date fields will be blank) Date Medicare IM given:   Date Additional Medicare IM given:    Discharge Disposition:    Per UR Regulation:  Reviewed for med. necessity/level of care/duration of stay  If discussed at Long Length of Stay Meetings, dates discussed:    Comments:  [id number] Earlene Plater, RN, BSN, CCM: CHART REVIEWED AND UPDATED. NO DISCHARGE NEEDS PRESENT AT THIS TIME. CASE MANAGEMENT [phone]

## 2012-03-26 NOTE — Progress Notes (Signed)
Name: Keith Bell MRN: [id number] DOB: [birth year]    LOS: 3  Referring Provider: [facility] Reason for Referral:  Pna/ shock  PULMONARY / CRITICAL CARE MEDICINE  HPI: 61 yo male former smoker with hx of Hodgkin's disease with persistent Rt upper lobe infiltrate.  Current Status: Still has intermittent cough.  Denies chest pain.  Reports breathing is improved   Vital Signs: Temp:  [97 F (36.1 C)-98.4 F (36.9 C)] 97.7 F (36.5 C) (08/12 0800) Pulse Rate:  [89-118] 106  (08/12 0900) Resp:  [12-31] 28  (08/12 0900) BP: (89-149)/(54-88) 101/59 mmHg (08/12 0900) SpO2:  [91 %-99 %] 94 % (08/12 0900) FIO2:  RA  Physical Examination: General - no distress HEENT - no sinus tenderness Cardiac - s1s2 regular Chest - decreased breath sounds Rt upper lung region, no wheeze Abd - soft, non tender Ext - no edema Neuro - normal strength Skin - no rashes  Ct Chest Wo Contrast  03/24/2012  *RADIOLOGY REPORT*  Clinical Data: Recurrent pneumonia.  History of Hodgkin's lymphoma.  CT CHEST WITHOUT CONTRAST  Technique:  Multidetector CT imaging of the chest was performed following the standard protocol without IV contrast.  Comparison: Chest radiograph 03/23/2012, CT 02/23/2012, PET CT scan 12/21/2011  Findings: Compared to the CT of 02/23/2012, there is again demonstrated a dense pneumonia within the right upper lobe superimposed on severe central lobular emphysema.  This process involves more of the right upper lobe tahan prior and is now confluent throughout the right upper lobe and apex.  There is a new curvilinear gas collection posterior at the right lung apex which either represents a confluence of the severe central emphysema or a pneumothorax.  No significant volume loss associated this process. The air bronchograms seen in the right middle lobe on prior exam are filled.(Image 24).  There is new nodular branching air space disease within the lingula (image 40).  Likewise in the  posterior right lower lobe there is increase in interlobular septal thickening and mild air space disease.  There is small right effusion at the right lung base which is increased.  Central airways are normal.  There is a port in the right chest wall.  No axillary supraclavicular adenopathy.  Small mediastinal lymph nodes similar to prior.  Esophagus is thick-walled.  Heart appears normal this noncontrast exam.  Review of the upper abdomen is somewhat limited due to high density barium within the splenic flexure the colon.  The spleen appears normal.  Limited view of the skeleton is unremarkable.  IMPRESSION:  1.  Continued progression of severe consolidated pneumonia in the right upper lobe superimposed on severe central lobular emphysema. 2.  Some retraction of the pleural edge at the posterior right lung apex which either represents  consolidation of central lobular emphysema or small pneumothorax. 3.  There is new air space disease in the lingula with a peripheral nodular component which suggest progression of the same process in the right upper lobe. 4.  Mild air space disease in the right lower lobe.  Original Report Authenticated By: Genevive Bi, M.D.   US Renal  03/25/2012  *RADIOLOGY REPORT*  Clinical Data: Renal failure  RENAL/URINARY TRACT ULTRASOUND COMPLETE  Comparison: PET CT scan 12/21/2011  Findings:  Right Kidney = 11.4 cm.  No evidence of hydronephrosis, cyst, mass, or stone.  Left kidney = 10.8 cm.  No evidence of hydronephrosis, cyst, mass, or stone.  Bladder:  Bladder partially distended.  No irregularity identified.  IMPRESSION:  Normal renal  ultrasound.  Original Report Authenticated By: Genevive Bi, M.D.   Dg Chest Port 1 View  03/26/2012  *RADIOLOGY REPORT*  Clinical Data: Pneumonia.  PORTABLE CHEST - 1 VIEW  Comparison: 03/24/2012.  03/23/2012.  Findings: Compared to the prior chest radiograph, there is worsening aeration at the bases, there is worsening aeration at the bases,  likely representing pulmonary edema superimposed on the right upper lobe consolidation.  Right IJ power port is unchanged. Mediastinal contours appear within normal limits.  There is no plain film evidence of pneumothorax.  IMPRESSION: 1.  Unchanged support apparatus. 2.  Persistent right upper lobe consolidation. 3.  Development of airspace disease at both lung bases.  This is favored to represent edema however aspiration or multifocal pneumonia are in the differential considerations.  Original Report Authenticated By: Andreas Newport, M.D.    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 P: Per primary team   RENAL  Lab 03/26/12 0515 03/25/12 2056 03/25/12 0828 03/24/12 0323 03/23/12 0955  NA 144 145 148* 140 143  K 3.4* 3.4* -- -- --  CL 112 111 114* 111 110  CO2 19 19 21  14* 14*  BUN 27* 28* 31* 45* 51*  CREATININE 2.21* 2.10* 2.19* 2.40* 2.81*  CALCIUM 7.8* 7.7* 7.5* 8.1* 9.5  MG 1.9 -- 1.2* -- --  PHOS -- -- -- -- --   Intake/Output      08/11 0701 - 08/12 0700 08/12 0701 - 08/13 0700   P.O. 840 360   I.V. (mL/kg) 3375 (60.8) 350 (6.3)   Blood 627.5    IV Piggyback 406 162.5   Total Intake(mL/kg) 5248.5 (94.6) 872.5 (15.7)   Urine (mL/kg/hr) 3000 (2.3) 400   Emesis/NG output     Stool 1     Total Output 3001 400   Net +2247.5 +472.5        Stool Occurrence 1 x      8/10 Renal u/s>>normal  A: Chronic renal insufficiency (baseline 1.5)  Acute worsening due to volume depletion.  Metabolic acidosis>>resolved.  Hypernatremia. P:   Continue D5W IV fluid Monitor renal fx, urine outpt, electrolytes  GASTROINTESTINAL  A:  Nutrition with failure to thrive. P: Tolerating diet  HEMATOLOGIC  Lab 03/26/12 0515 03/25/12 2056 03/25/12 0828 03/24/12 1600 03/24/12 0323 03/23/12 0955  HGB 9.7* 10.8* 7.6* -- 8.2* 8.0*  HCT 27.7* 30.9* 21.5* -- 23.8* 23.9*  PLT 40* 43* 40* 47* 44* --  INR -- -- -- 1.24 -- 1.16  APTT -- -- -- 53* -- --   A: Anemia of chronic disease.  Thrombocytopenia.  Stage IV Hodgkin's disease. P: F/u CBC Oncology following   INFECTIOUS  Lab 03/26/12 0515 03/25/12 2056 03/25/12 0828 03/24/12 1149 03/24/12 0323 03/23/12 0955  WBC 8.0 10.1 9.7 -- 7.0 8.5  PROCALCITON 5.61 -- 6.20 7.59 -- 13.51  Cultures: Sputum 8/9>>?few fungus MRSA 8/9 > neg BC x 2 8/9 >>> Urine strep 8/9 > neg Urine Legionella 8/9 > neg  Antibiotics: Zosyn 8/9 >>> Vanc 8/9 >>>   A: HCAP.  ?fungus/mold in sputum from 8/9>>spoke with micro lab 8/12, and they will run additional testing. P: Continue Abx F/u sputum culture results with ?mold/fungus  ENDOCRINE  A:  No issues  NEUROLOGIC  A: No issues  BEST PRACTICE / DISPOSITION Level of Care:  SD>>okay to transfer to medical floor if okay with [facility] Primary Service:  [facility] Consultants:  Heme/onc, PCCM Code Status: Limited>>No CPR, No intubation, No defibrillation Diet: Regular DVT Px:  PAS (anemia, thrombocytopenia) GI Px:  h2 Social / Family: Updated family at bedside  Coralyn Helling, MD [facility] Pulmonary/Critical Care 03/26/2012, 10:31 AM Pager:  [phone] After 3pm call: [phone]

## 2012-03-27 ENCOUNTER — Encounter (HOSPITAL_COMMUNITY): Payer: Self-pay | Admitting: Internal Medicine

## 2012-03-27 DIAGNOSIS — D509 Iron deficiency anemia, unspecified: Secondary | ICD-10-CM

## 2012-03-27 LAB — BASIC METABOLIC PANEL
BUN: 27 mg/dL — ABNORMAL HIGH (ref 6–23)
Calcium: 8.1 mg/dL — ABNORMAL LOW (ref 8.4–10.5)
GFR calc Af Amer: 34 mL/min — ABNORMAL LOW (ref >90)
GFR calc non Af Amer: 30 mL/min — ABNORMAL LOW (ref >90)
Potassium: 3.8 mEq/L (ref 3.5–5.1)
Sodium: 139 mEq/L (ref 135–145)

## 2012-03-27 LAB — CBC
Hemoglobin: 10.5 g/dL — ABNORMAL LOW (ref 13.0–17.0)
MCHC: 34.5 g/dL (ref 30.0–36.0)
Platelets: 31 10*3/uL — ABNORMAL LOW (ref 150–400)

## 2012-03-27 LAB — MAGNESIUM: Magnesium: 1.6 mg/dL (ref 1.5–2.5)

## 2012-03-27 MED ORDER — SODIUM BICARBONATE 650 MG PO TABS
650.0000 mg | ORAL_TABLET | Freq: Three times a day (TID) | ORAL | Status: DC
  Administered 2012-03-27 – 2012-03-29 (×7): 650 mg via ORAL
  Filled 2012-03-27 (×14): qty 1

## 2012-03-27 MED ORDER — SODIUM CHLORIDE 0.9 % IJ SOLN
10.0000 mL | INTRAMUSCULAR | Status: DC | PRN
  Administered 2012-03-31: 20 mL
  Administered 2012-03-31: 10 mL
  Administered 2012-04-01: 20 mL
  Administered 2012-04-04: 10 mL

## 2012-03-27 MED ORDER — SODIUM BICARBONATE 650 MG PO TABS
650.0000 mg | ORAL_TABLET | Freq: Two times a day (BID) | ORAL | Status: DC
  Filled 2012-03-27 (×2): qty 1

## 2012-03-27 MED ORDER — SODIUM CHLORIDE 0.9 % IV SOLN
INTRAVENOUS | Status: DC
  Administered 2012-03-27: 10:00:00 via INTRAVENOUS
  Administered 2012-03-28: 50 mL/h via INTRAVENOUS

## 2012-03-27 MED ORDER — MEGESTROL ACETATE 40 MG PO TABS
400.0000 mg | ORAL_TABLET | Freq: Two times a day (BID) | ORAL | Status: DC
  Administered 2012-03-27 – 2012-04-03 (×12): 400 mg via ORAL
  Filled 2012-03-27 (×19): qty 10

## 2012-03-27 MED ORDER — MAGNESIUM SULFATE 40 MG/ML IJ SOLN
2.0000 g | Freq: Once | INTRAMUSCULAR | Status: AC
  Administered 2012-03-27: 2 g via INTRAVENOUS
  Filled 2012-03-27: qty 50

## 2012-03-27 NOTE — Clinical Social Work Note (Signed)
CSW left message for daughter about HH due to RN's request. CSW also informed RN CM.  No other CSW needs noted currently.   Doreen Salvage, LCSWA Clinical Social Worker [facility] Cell [phone]

## 2012-03-27 NOTE — Progress Notes (Signed)
[facility] PROGRESS NOTE  ASSAD HARBESON KGM:[id number] DOB: 1950-10-25 DOA: 03/23/2012 PCP: Eino Farber, MD  Assessment/Plan: Principal Problem:  *Sepsis Active Problems:  Hodgkin's lymphoma in relapse  COPD (chronic obstructive pulmonary disease)  Hyperlipidemia  Hypotension  Acute on chronic renal failure  Iron deficiency anemia  CKD (chronic kidney disease), stage III  Metabolic acidosis  Necrotizing pneumonia  SIRS (systemic inflammatory response syndrome)  HCAP (healthcare-associated pneumonia)  Thrombocytopenia  Hypomagnesemia  Hypokalemia  Hypernatremia  #1 hypotension Likely secondary to sepsis secondary to recurrent probable necrotizing pneumonia/healthcare associated pneumonia. Patient with systolic blood pressure in the 90s overnight. Systolic blood pressure improved.  Pro calcitonin was elevated at 13 on admission decreasing and now at 5.61. Repeat pro calcitonin levels in AM.  Sputum Gram stain and cultures with fungus ?? Contaminant.. Blood cultures are pending. Urine Legionella is negative. Urine strep pneumococcus is negative. Continue IV fluids. Vancomycin d/c'd yesterday per ID. Continue zosyn. PCCM following and appreciate input and recommendations. Decrease IVF to 75 cc/hr.  #2 recurrent necrotizing pneumonia/healthcare associated pneumonia/?inflammatory vs fungal vs noninfectiuos This is patient's third hospitalization for right upper lobe pneumonia first episode in June of 2013 second episode in July of 2013 with patient was diagnosed with a necrotizing pneumonia per pulmonary medicine. Patient was discharged home on Levaquin and augmentin for 3 weeks, however concern as to whether patient took full course of antibiotics. Patient is presenting with productive cough, chills, fever, chest x-ray consistent with worsening pneumonia. Sputum Gram stain and cultures are pending. Urine strep pneumococcus is negative. A urine Legionella is negative. Pro  calcitonin levels were elevated at 13 on admission and now at 5.61 and trending down. Lactic acid level was 2.1 on admission and trending down. Will repeat pro calcitonin in the a.m. Sputum with some fungus however likely contaminant.Repeat CT of the chest with continued progression of severe consolidated pneumonia in the right upper lobe superimposed on severe centrilobular emphysema and also do a space disease in the lingula with a peripheral nodular component suggestive of a retinal same process in the right upper lobe, mild airspace disease in the right lower lobe. Pulmonary critical care is following and recommend probable bronchoscopy vs steriods. Continue empiric IV Zosyn. Vancomycin d/c'd per ID. Nebs as needed. Mucinex. Tessalon Perles. Pulmonary and critical care following. ID ff and appreciate input and rxcs.   #3 sepsis Likely secondary to problem #2. Urinalysis is negative. Chest x-ray consistent with recurrent worsening pneumonia. CT of the chest with multilobar and pneumonia and progression of worsening recurrent right upper lobe pneumonia .Blood cultures are pending. Pro calcitonin level was elevated on admission at 13 and trending down. Lactic acid level was 2.1 and trending down. Urine strep pneumococcus is negative. Urine Legionella is negative. Sputum with some fungus however likely contaminant. Repeat pro calcitonin levels in the a.m. Continue empiric IV Zosyn. Vancomycin d/c'd per ID. Pulmonary and critical care medicine ff. ID ff  #4 acute on chronic kidney disease stage III Patient's baseline creatinine is approximately 1.4-1.7. Creatinine on admission was 2.8. Likely secondary to a prerenal azotemia. Creatinine improving with hydration and currently at 2.25 from 2.21 and plateauing. Renal ultrasound normal. Continue supportive care follow. Decrease IV fluids 75 cc per hour and follow. Will place on bicarbonate tablets. Follow.  #5 thrombocytopenia/Anemia Likely secondary to problem  #3 and afinitor. Patient is status post 3 unit of packed red blood cells. Afinitor has been discontinued. Hgb is now 10.5 from 9.7 from 7.6. LDH WNL.  Haptoglobin elevated at 409. Peripheral smear negative for schistocytes, however with elevated d dimer and fibrinogen. Doubt if in DIC. Hematology/oncology ff and appreciate input and rxcs.  #6 COPD Stable. Nebs when necessary.  #7 metabolic acidosis Likely secondary to sepsis versus acute on chronic kidney disease stage III. Improved with HCO3 in IVF. D/C'd HCO3 yesterday. HCO trending back down. Will place on bicarbonate tablets Follow. See problem #3.  #8 hypomagnesemia/hypokalemia Replete.  #9 hypernatremia Improved with change of IV fluids. Change IVF back to NS and decrease rate.  #10 constipation Resolved with MiraLAX. Colace 100 mg twice a day.   #11 Hodgkin's lymphoma, relapse Afinitor has been discontinued secondary to his anemia, thrombocytopenia, sepsis. Hematology oncology is following.  #12 prophylaxis Pepcid for GI prophylaxis. SCDs for DVT prophylaxis.  #13: Code Status Discussed CODE STATUS with patient after reviewing Dr. Mamie Levers note from 03/17/2011. Patient is in agreement with a Limited code. No CPR. No mechanical ventilation. May use pressors or antiarrhythmics.   Code Status: Limited code. Family Communication: Updated patient and sister via telephone. Updated patient at bedside.  Disposition Plan: Transfer to telemetry. Home when medically stable.   Brief narrative: Very pleasant 61 yo with hx copd, hodgkins lymphoma, necrotizing pna presents to ED cc sob and cough. Information provided by painted. States symptoms began 3 days ago have persisted and worsened. Associated symptoms include chills, subjective fever, pleuritic chest pain and sinus congestion as well as some nausea and 3 episodes of vomiting. Reports emesis clear without dk coffee ground substances. Denies dysuria/hematuria abdominal pain or  diarrhea. In ED pt was tachycardic, hypotensive and chest xray concerning for pna. Of note pt was discharged from hospital 7/26 after 5 day stay for similar symptoms. Was discharge on levaquin to be taken for 3 week. [facility] asked to admit      Consultants:  Pulmonary: Dr Sherene Sires 03/24/12   Hematolgy/Oncology: Dr Darrold Span 03/24/12  Procedures:  CXR  03/23/12  Status post 1 unit of packed red blood cells on 03/23/2012  V/Q scan 03/24/12  CT Chest 03/24/12  Renal US 03/24/12  2 units PRBC  03/25/12  Antibiotics:  IV Vancomycin 03/23/12---> 03/26/12  IV Zosyn 03/23/12  HPI/Subjective: Patient states some improvement in SOB, COUGH, pleuritic CP. Per family patient not eating.  Objective: Filed Vitals:   03/27/12 0500 03/27/12 0600 03/27/12 0700 03/27/12 0800  BP: 97/69 106/72 118/57 113/70  Pulse: 109 110 114 117  Temp:    98.4 F (36.9 C)  TempSrc:    Oral  Resp: 30 26 30 22   Height:      Weight:      SpO2: 93% 93% 93% 94%    Intake/Output Summary (Last 24 hours) at 03/27/12 0915 Last data filed at 03/27/12 0849  Gross per 24 hour  Intake   2866 ml  Output   1900 ml  Net    966 ml    Exam: General: Alert, awake, oriented x3, in no acute distress. Coughing. HEENT: No bruits, no goiter. Heart: Tachycardia, without murmurs, rubs, gallops. Lungs: CTAB Abdomen: Soft, nontender, nondistended, positive bowel sounds. Extremities: No clubbing cyanosis or edema with positive pedal pulses. Neuro: Grossly intact, nonfocal.  Data Reviewed: Basic Metabolic Panel:  Lab 03/27/12 1610 03/26/12 0515 03/25/12 2056 03/25/12 0828 03/24/12 0323  NA 139 144 145 148* 140  K 3.8 3.4* 3.4* 2.8* 3.7  CL 108 112 111 114* 111  CO2 17* 19 19 21  14*  GLUCOSE 122* 154* 131* 179* 100*  BUN  27* 27* 28* 31* 45*  CREATININE 2.25* 2.21* 2.10* 2.19* 2.40*  CALCIUM 8.1* 7.8* 7.7* 7.5* 8.1*  MG 1.6 1.9 -- 1.2* --  PHOS -- -- -- -- --   Liver Function Tests: No results found for this basename:  AST:5,ALT:5,ALKPHOS:5,BILITOT:5,PROT:5,ALBUMIN:5 in the last 168 hours No results found for this basename: LIPASE:5,AMYLASE:5 in the last 168 hours No results found for this basename: AMMONIA:5 in the last 168 hours CBC:  Lab 03/27/12 0450 03/26/12 0515 03/25/12 2056 03/25/12 0828 03/24/12 1600 03/24/12 0323  WBC 8.8 8.0 10.1 9.7 -- 7.0  NEUTROABS -- 6.6 -- 7.9* -- --  HGB 10.5* 9.7* 10.8* 7.6* -- 8.2*  HCT 30.4* 27.7* 30.9* 21.5* -- 23.8*  MCV 80.6 81.0 80.9 83.0 -- 84.1  PLT 31* 40* 43* 40* 47* --   Cardiac Enzymes:  Lab 03/25/12 2055 03/25/12 0225 03/24/12 1840 03/23/12 0955  CKTOTAL 24 20 22  --  CKMB 1.7 1.5 1.7 --  CKMBINDEX -- -- -- --  TROPONINI <0.30 <0.30 <0.30 <0.30   BNP (last 3 results)  Basename 03/24/12 1149 03/05/12 2100 02/22/12 1600  PROBNP 1538.0* 479.3* 1118.0*   CBG: No results found for this basename: GLUCAP:5 in the last 168 hours  Recent Results (from the past 240 hour(s))  CULTURE, BLOOD (ROUTINE X 2)     Status: Normal (Preliminary result)   Collection Time   03/23/12 11:24 AM      Component Value Range Status Comment   Specimen Description BLOOD PORTA CATH   Final    Special Requests BOTTLES DRAWN AEROBIC AND ANAEROBIC 3CC   Final    Culture  Setup Time 03/24/2012 01:00   Final    Culture     Final    Value:        BLOOD CULTURE RECEIVED NO GROWTH TO DATE CULTURE WILL BE HELD FOR 5 DAYS BEFORE ISSUING A FINAL NEGATIVE REPORT   Report Status PENDING   Incomplete   CULTURE, BLOOD (ROUTINE X 2)     Status: Normal (Preliminary result)   Collection Time   03/23/12 11:31 AM      Component Value Range Status Comment   Specimen Description BLOOD LEFT HAND   Final    Special Requests BOTTLES DRAWN AEROBIC AND ANAEROBIC 4CC   Final    Culture  Setup Time 03/24/2012 01:00   Final    Culture     Final    Value:        BLOOD CULTURE RECEIVED NO GROWTH TO DATE CULTURE WILL BE HELD FOR 5 DAYS BEFORE ISSUING A FINAL NEGATIVE REPORT   Report Status PENDING    Incomplete   MRSA PCR SCREENING     Status: Normal   Collection Time   03/23/12  2:37 PM      Component Value Range Status Comment   MRSA by PCR NEGATIVE  NEGATIVE Final   CULTURE, EXPECTORATED SPUTUM-ASSESSMENT     Status: Normal   Collection Time   03/23/12  3:33 PM      Component Value Range Status Comment   Specimen Description SPUTUM   Final    Special Requests NONE   Final    Sputum evaluation     Final    Value: THIS SPECIMEN IS ACCEPTABLE. RESPIRATORY CULTURE REPORT TO FOLLOW.   Report Status 03/23/2012 FINAL   Final   CULTURE, RESPIRATORY     Status: Normal   Collection Time   03/23/12  4:05 PM      Component  Value Range Status Comment   Specimen Description SPUTUM   Final    Special Requests NONE   Final    Gram Stain     Final    Value: NO WBC SEEN     NO SQUAMOUS EPITHELIAL CELLS SEEN     NO ORGANISMS SEEN   Culture     Final    Value: FEW FUNGUS (MOLD) ISOLATED, PROBABLE CONTAMINANT/COLONIZER (SAPROPHYTE). CONTACT MICROBIOLOGY IF FURTHER IDENTIFICATION REQUIRED [phone].   Report Status 03/26/2012 FINAL   Final      Studies: Dg Chest 2 View  03/23/2012  *RADIOLOGY REPORT*  Clinical Data: Cough.  Pleurisy.  Short of breath.  History of cancer.  CHEST - 2 VIEW  Comparison: 03/05/2012.  Findings: Progressive airspace disease is present in the right upper lobe, best seen on the lateral view.  When comparing lateral views to prior chest radiograph 03/05/2012, there is continued/progressive consolidation of the right upper lobe.  Support apparatus appears unchanged.  There is some faint airspace disease in the left midlung as well.  Cardiopericardial silhouette unchanged.  Right IJ power port appears similar.  IMPRESSION: Progressive consolidation of the right upper lobe, best seen on the lateral view.  Faint airspace opacity in the left midlung, which may also represent pneumonia.  Original Report Authenticated By: Andreas Newport, M.D.   Dg Chest 2 View  03/05/2012  *RADIOLOGY  REPORT*  Clinical Data: Respiratory distress.  CHEST - 2 VIEW  Comparison: 02/27/2012.  Findings: The power port is stable.  The right upper lobe airspace process shows a slight decrease and consolidation.  The remainder the lungs are clear.  IMPRESSION: Persistent but slight improved right upper lobe pneumonia.  Original Report Authenticated By: P. Loralie Champagne, M.D.   Dg Chest 2 View  02/27/2012  *RADIOLOGY REPORT*  Clinical Data: Cough, congestion.  CHEST - 2 VIEW  Comparison: 02/22/2012  Findings: Right Port-A-Cath remains in place, unchanged.  Dense consolidation in the right upper lobe is stable.  Left lung is clear.  Heart is normal size.  No effusions.  IMPRESSION: Stable dense right upper lobe consolidation.  Original Report Authenticated By: Cyndie Chime, M.D.   Ct Chest Wo Contrast  02/23/2012  *RADIOLOGY REPORT*  Clinical Data: Right lower lobe infiltrate.  Non Hodgkin lymphoma. Shortness of breath.  CT CHEST WITHOUT CONTRAST  Technique:  Multidetector CT imaging of the chest was performed following the standard protocol without IV contrast.  Comparison: PET 12/21/2011. CT chest 12/18/2008.  Findings: No pathologically enlarged mediastinal or axillary lymph nodes.  The hilar regions are difficult to definitively evaluate without IV contrast.  Coronary artery calcification.  Heart size normal.  No pericardial effusion.  Emphysema.  Airspace consolidation with internal rounded lucencies involving the majority of the anterior and posterior segment of the right upper lobe.  Associated air bronchograms.  Additional consolidation is seen in the medial aspect of the right lower lobe, with at least one air-fluid level (image 30), possibly with a previous site of bullous disease, as seen on 12/18/2008. Additional scattered peribronchovascular nodularity and mild consolidation in the right lower lobe, inferiorly.  7 mm subpleural left upper lobe nodule (image 36) is unchanged from 12/18/2008, indicating  a benign etiology.  No pleural fluid.  Airway is unremarkable.  Incidental imaging of the upper abdomen shows no acute findings. No worrisome lytic or sclerotic lesions.  IMPRESSION:  Right upper and right lower lobe pneumonia. Associated fluid in multiple bullous lesions in the right upper and right  lower lobes.  Original Report Authenticated By: Reyes Ivan, M.D.   Dg Esophagus  03/08/2012  *RADIOLOGY REPORT*  Clinical Data: Dysphagia.  ESOPHOGRAM / BARIUM SWALLOW  Technique:  Combined double contrast and single contrast examination performed using effervescent crystals, thick barium liquid, and thin barium liquid.  Fluoroscopy time:  1.2 minutes.  Comparison: 02/23/2012  Findings:  The cervical and thoracic esophagus are patent.  No high- grade stricture or mass.  Normal motility of the esophagus.  The patient ingested a 13 mm barium tablet which easily passed through the esophagus and into the stomach.  No reflux identified.  IMPRESSION:  1.  Normal esophagram.  Original Report Authenticated By: Rosealee Albee, M.D.   Dg Abd 2 Views  02/26/2012  *RADIOLOGY REPORT*  Clinical Data: Nausea and vomiting.  Hodgkin's lymphoma.  COPD.  ABDOMEN - 2 VIEW  Comparison: PET of 12/21/2011.  Findings: Upright view abdomen and a supine view of the abdomen and pelvis.  Upright view abdomen demonstrates no free intraperitoneal air or significant air fluid levels.  Interstitial thickening at the lung bases without lobar consolidation.  Supine view of the abdomen pelvis demonstrates a large colonic stool burden.  Distal gas.  No bowel distention.  Minimal convex right lumbar spine curvature.  IMPRESSION: Colonic stool burden suggests constipation.  No acute findings.  Original Report Authenticated By: Consuello Bossier, M.D.    Scheduled Meds:    . benzonatate  200 mg Oral TID  . dextromethorphan-guaiFENesin  2 tablet Oral BID  . docusate sodium  100 mg Oral BID  . famotidine  20 mg Oral Daily  . feeding  supplement  237 mL Oral BID BM  . magnesium sulfate 1 - 4 g bolus IVPB  2 g Intravenous Once  . megestrol  400 mg Oral BID  . multivitamin with minerals  1 tablet Oral Daily  . piperacillin-tazobactam (ZOSYN)  IV  3.375 g Intravenous Q8H  . potassium chloride SA  20 mEq Oral Daily  . sodium bicarbonate  650 mg Oral BID  . sodium chloride  3 mL Intravenous Q12H  . DISCONTD: famotidine  20 mg Oral BID  . DISCONTD: megestrol  400 mg Oral BID  . DISCONTD: vancomycin  750 mg Intravenous Q24H   Continuous Infusions:    . sodium chloride    . DISCONTD: dextrose 100 mL/hr at 03/26/12 1303    Principal Problem:  *Sepsis Active Problems:  Hodgkin's lymphoma in relapse  COPD (chronic obstructive pulmonary disease)  Hyperlipidemia  Hypotension  Acute on chronic renal failure  Iron deficiency anemia  CKD (chronic kidney disease), stage III  Metabolic acidosis  Necrotizing pneumonia  SIRS (systemic inflammatory response syndrome)  HCAP (healthcare-associated pneumonia)  Thrombocytopenia  Hypomagnesemia  Hypokalemia  Hypernatremia    Time spent: 40 mins    [facility]  [facility] Pager [phone]. If 8PM-8AM, please contact night-coverage at [email or web address], password [facility] 03/27/2012, 9:15 AM  LOS: 4 days

## 2012-03-27 NOTE — Progress Notes (Signed)
INFECTIOUS DISEASE PROGRESS NOTE  ID: Keith Bell is a 61 y.o. 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    Subjective: Sleeping, still with pain3  Abtx:  Anti-infectives     Start     Dose/Rate Route Frequency Ordered Stop   03/25/12 1100   vancomycin (VANCOCIN) 750 mg in sodium chloride 0.9 % 150 mL IVPB  Status:  Discontinued        750 mg 150 mL/hr over 60 Minutes Intravenous Every 48 hours 03/23/12 1452 03/25/12 0945   03/25/12 1000   vancomycin (VANCOCIN) 750 mg in sodium chloride 0.9 % 150 mL IVPB  Status:  Discontinued        750 mg 150 mL/hr over 60 Minutes Intravenous Every 24 hours 03/25/12 0945 03/26/12 1444   03/23/12 2100  piperacillin-tazobactam (ZOSYN) IVPB 3.375 g       3.375 g 12.5 mL/hr over 240 Minutes Intravenous Every 8 hours 03/23/12 1452     03/23/12 1115   vancomycin (VANCOCIN) IVPB 1000 mg/200 mL premix     Comments: BEGIN AFTER BLOOD CULTURES DRAWN      1,000 mg 200 mL/hr over 60 Minutes Intravenous  Once 03/23/12 1109 03/23/12 1238   03/23/12 1115  piperacillin-tazobactam (ZOSYN) IVPB 3.375 g    Comments: BEGIN AFTER BLOOD CULTURES DRAWN     3.375 g 12.5 mL/hr over 240 Minutes Intravenous  Once 03/23/12 1109 03/23/12 1641          Medications: I have reviewed the patient's current medications.  Objective: Vital signs in last 24 hours: Temp:  [98 F (36.7 C)-98.6 F (37 C)] 98 F (36.7 C) (08/13 1324) Pulse Rate:  [106-119] 119  (08/13 1324) Resp:  [21-32] 22  (08/13 1324) BP: (93-118)/(53-81) 112/81 mmHg (08/13 1324) SpO2:  [91 %-97 %] 93 % (08/13 1324) Weight:  [127 lb 3.3 oz (57.7 kg)] 127 lb 3.3 oz (57.7 kg) (08/13 0000)   General appearance: no distress Resp: clear to auscultation bilaterally Cardio: regular rate and rhythm, S1, S2 normal, no murmur, click, rub or gallop  Lab Results  Basename 03/27/12 0450 03/26/12 0515  WBC 8.8 8.0  HGB 10.5* 9.7*    HCT 30.4* 27.7*  NA 139 144  K 3.8 3.4*  CL 108 112  CO2 17* 19  BUN 27* 27*  CREATININE 2.25* 2.21*  GLU -- --   Liver Panel No results found for this basename: PROT:2,ALBUMIN:2,AST:2,ALT:2,ALKPHOS:2,BILITOT:2,BILIDIR:2,IBILI:2 in the last 72 hours Sedimentation Rate  Basename 03/26/12 0515  ESRSEDRATE 118*   C-Reactive Protein No results found for this basename: CRP:2 in the last 72 hours  Microbiology: Recent Results (from the past 240 hour(s))  CULTURE, BLOOD (ROUTINE X 2)     Status: Normal (Preliminary result)   Collection Time   03/23/12 11:24 AM      Component Value Range Status Comment   Specimen Description BLOOD PORTA CATH   Final    Special Requests BOTTLES DRAWN AEROBIC AND ANAEROBIC 3CC   Final    Culture  Setup Time 03/24/2012 01:00   Final    Culture     Final    Value:        BLOOD CULTURE RECEIVED NO GROWTH TO DATE CULTURE WILL BE HELD FOR 5 DAYS BEFORE ISSUING A FINAL NEGATIVE REPORT   Report Status PENDING   Incomplete   CULTURE, BLOOD (ROUTINE X 2)     Status: Normal (Preliminary result)  Collection Time   03/23/12 11:31 AM      Component Value Range Status Comment   Specimen Description BLOOD LEFT HAND   Final    Special Requests BOTTLES DRAWN AEROBIC AND ANAEROBIC 4CC   Final    Culture  Setup Time 03/24/2012 01:00   Final    Culture     Final    Value:        BLOOD CULTURE RECEIVED NO GROWTH TO DATE CULTURE WILL BE HELD FOR 5 DAYS BEFORE ISSUING A FINAL NEGATIVE REPORT   Report Status PENDING   Incomplete   MRSA PCR SCREENING     Status: Normal   Collection Time   03/23/12  2:37 PM      Component Value Range Status Comment   MRSA by PCR NEGATIVE  NEGATIVE Final   CULTURE, EXPECTORATED SPUTUM-ASSESSMENT     Status: Normal   Collection Time   03/23/12  3:33 PM      Component Value Range Status Comment   Specimen Description SPUTUM   Final    Special Requests NONE   Final    Sputum evaluation     Final    Value: THIS SPECIMEN IS ACCEPTABLE.  RESPIRATORY CULTURE REPORT TO FOLLOW.   Report Status 03/23/2012 FINAL   Final   CULTURE, RESPIRATORY     Status: Normal   Collection Time   03/23/12  4:05 PM      Component Value Range Status Comment   Specimen Description SPUTUM   Final    Special Requests NONE   Final    Gram Stain     Final    Value: NO WBC SEEN     NO SQUAMOUS EPITHELIAL CELLS SEEN     NO ORGANISMS SEEN   Culture     Final    Value: FEW FUNGUS (MOLD) ISOLATED, PROBABLE CONTAMINANT/COLONIZER (SAPROPHYTE). CONTACT MICROBIOLOGY IF FURTHER IDENTIFICATION REQUIRED [phone].   Report Status 03/26/2012 FINAL   Final     Studies/Results: Dg Chest Port 1 View  03/26/2012  *RADIOLOGY REPORT*  Clinical Data: Pneumonia.  PORTABLE CHEST - 1 VIEW  Comparison: 03/24/2012.  03/23/2012.  Findings: Compared to the prior chest radiograph, there is worsening aeration at the bases, there is worsening aeration at the bases, likely representing pulmonary edema superimposed on the right upper lobe consolidation.  Right IJ power port is unchanged. Mediastinal contours appear within normal limits.  There is no plain film evidence of pneumothorax.  IMPRESSION: 1.  Unchanged support apparatus. 2.  Persistent right upper lobe consolidation. 3.  Development of airspace disease at both lung bases.  This is favored to represent edema however aspiration or multifocal pneumonia are in the differential considerations.  Original Report Authenticated By: Andreas Newport, M.D.     Assessment/Plan: 1)  Pulmonary infiltrate - doubt infectious etiology.  On zosyn but if no improvement radiographically on tomorrows CXR, will stop.  Otherwise, plan as per pulmonary with the repeat CXR and consideration of steroids and bronchoscopy.    [name], [name] Infectious Diseases 03/27/2012, 4:54 PM

## 2012-03-27 NOTE — Progress Notes (Signed)
Name: Keith Bell MRN: [id number] DOB: August 14, 1951    LOS: 4  Referring Provider: [facility] Reason for Referral:  Pna/ shock  PULMONARY / CRITICAL CARE MEDICINE  HPI: 61 yo male former smoker with hx of Hodgkin's disease with persistent Rt upper lobe infiltrate.  Current Status: Still has intermittent cough.  Denies chest pain.  Reports breathing is improved   Vital Signs: Temp:  [97.6 F (36.4 C)-98.6 F (37 C)] 98.4 F (36.9 C) (08/13 0800) Pulse Rate:  [99-119] 113  (08/13 0900) Resp:  [21-32] 24  (08/13 0900) BP: (93-129)/(53-90) 97/55 mmHg (08/13 0900) SpO2:  [91 %-97 %] 92 % (08/13 0900) Weight:  [127 lb 3.3 oz (57.7 kg)] 127 lb 3.3 oz (57.7 kg) (08/13 0000) FIO2:  RA  Physical Examination: General - no distress HEENT - no sinus tenderness Cardiac - s1s2 regular Chest - decreased breath sounds Rt upper lung region, no wheeze Abd - soft, non tender Ext - no edema Neuro - normal strength Skin - no rashes  Dg Chest Port 1 View  03/26/2012  *RADIOLOGY REPORT*  Clinical Data: Pneumonia.  PORTABLE CHEST - 1 VIEW  Comparison: 03/24/2012.  03/23/2012.  Findings: Compared to the prior chest radiograph, there is worsening aeration at the bases, there is worsening aeration at the bases, likely representing pulmonary edema superimposed on the right upper lobe consolidation.  Right IJ power port is unchanged. Mediastinal contours appear within normal limits.  There is no plain film evidence of pneumothorax.  IMPRESSION: 1.  Unchanged support apparatus. 2.  Persistent right upper lobe consolidation. 3.  Development of airspace disease at both lung bases.  This is favored to represent edema however aspiration or multifocal pneumonia are in the differential considerations.  Original Report Authenticated By: Andreas Newport, M.D.    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 P: Per primary team   RENAL  Lab 03/27/12 0450 03/26/12 0515 03/25/12 2056 03/25/12 0828 03/24/12 0323  NA 139 144 145 148* 140  K 3.8 3.4* -- -- --  CL 108 112 111 114* 111  CO2 17* 19 19 21  14*  BUN 27* 27* 28* 31* 45*  CREATININE 2.25* 2.21* 2.10* 2.19* 2.40*  CALCIUM 8.1* 7.8* 7.7* 7.5* 8.1*  MG 1.6 1.9 -- 1.2* --  PHOS -- -- -- -- --   Intake/Output      08/12 0701 - 08/13 0700 08/13 0701 - 08/14 0700   P.O. 480    I.V. (mL/kg) 2356 (40.8) 100 (1.7)   Blood     IV Piggyback 312.5    Total Intake(mL/kg) 3148.5 (54.6) 100 (1.7)   Urine (mL/kg/hr) 1500 (1.1) 400   Stool 0    Total Output 1500 400   Net +1648.5 -300        Urine Occurrence 1 x  Stool Occurrence 1 x      8/10 Renal u/s>>normal  A: Chronic renal insufficiency (baseline 1.5)  Acute worsening due to volume depletion.  Metabolic acidosis>>resolved.  Hypernatremia. P:   Continue D5W IV fluid Monitor renal fx, urine outpt, electrolytes  GASTROINTESTINAL  A:  Nutrition with failure to thrive. P: Tolerating diet  HEMATOLOGIC  Lab 03/27/12 0450 03/26/12 0515 03/25/12 2056 03/25/12 0828 03/24/12 1600 03/24/12 0323 03/23/12 0955  HGB 10.5* 9.7* 10.8* 7.6* -- 8.2* --  HCT 30.4* 27.7* 30.9* 21.5* -- 23.8* --  PLT 31* 40* 43* 40* 47* -- --  INR -- -- -- -- 1.24 -- 1.16  APTT -- -- -- -- 53* -- --   A: Anemia of  chronic disease.  Thrombocytopenia.  Stage IV Hodgkin's disease. P: F/u CBC Oncology following   INFECTIOUS  Lab 03/27/12 0450 03/26/12 0515 03/25/12 2056 03/25/12 0828 03/24/12 1149 03/24/12 0323 03/23/12 0955  WBC 8.8 8.0 10.1 9.7 -- 7.0 --  PROCALCITON -- 5.61 -- 6.20 7.59 -- 13.51   Cultures: Sputum 8/9>>?few fungus MRSA 8/9 > neg BC x 2 8/9 >>> Urine strep 8/9 > neg Urine Legionella 8/9 > neg  Antibiotics: Zosyn 8/9 >>> Vanc 8/9 >>>8/12   A: HCAP.  ?fungus/mold in sputum from 8/9>>spoke with micro lab 8/12, and they will run additional testing. P: Continue Abx per ID F/u sputum culture results with ?mold/fungus  ENDOCRINE  A:  No issues  NEUROLOGIC  A: No issues  BEST PRACTICE / DISPOSITION Level of Care:  SD>>okay to transfer to medical floor if okay with [facility] Primary Service:  [facility] Consultants:  Heme/onc, PCCM, ID Code Status: Limited>>No CPR, No intubation, No defibrillation Diet: Regular DVT Px:  PAS (anemia, thrombocytopenia) GI Px:  h2 Social / Family: Updated family at bedside  Coralyn Helling, MD [facility] Pulmonary/Critical Care 03/27/2012, 10:01 AM Pager:  [phone] After 3pm call: [phone]

## 2012-03-28 ENCOUNTER — Inpatient Hospital Stay (HOSPITAL_COMMUNITY): Payer: Medicare Other

## 2012-03-28 DIAGNOSIS — I959 Hypotension, unspecified: Secondary | ICD-10-CM

## 2012-03-28 DIAGNOSIS — D696 Thrombocytopenia, unspecified: Secondary | ICD-10-CM

## 2012-03-28 LAB — CBC WITH DIFFERENTIAL/PLATELET
Basophils Absolute: 0 10*3/uL (ref 0.0–0.1)
Eosinophils Absolute: 0.4 10*3/uL (ref 0.0–0.7)
HCT: 29 % — ABNORMAL LOW (ref 39.0–52.0)
Lymphs Abs: 0.3 10*3/uL — ABNORMAL LOW (ref 0.7–4.0)
MCHC: 35.2 g/dL (ref 30.0–36.0)
MCV: 81.2 fL (ref 78.0–100.0)
Neutro Abs: 10 10*3/uL — ABNORMAL HIGH (ref 1.7–7.7)
RDW: 18.9 % — ABNORMAL HIGH (ref 11.5–15.5)

## 2012-03-28 LAB — URINALYSIS, ROUTINE W REFLEX MICROSCOPIC
Glucose, UA: 500 mg/dL — AB
Ketones, ur: NEGATIVE mg/dL
Leukocytes, UA: NEGATIVE
Specific Gravity, Urine: 1.017 (ref 1.005–1.030)
pH: 6 (ref 5.0–8.0)

## 2012-03-28 LAB — BASIC METABOLIC PANEL
BUN: 29 mg/dL — ABNORMAL HIGH (ref 6–23)
CO2: 17 mEq/L — ABNORMAL LOW (ref 19–32)
Chloride: 113 mEq/L — ABNORMAL HIGH (ref 96–112)
Creatinine, Ser: 2.32 mg/dL — ABNORMAL HIGH (ref 0.50–1.35)

## 2012-03-28 LAB — URINE MICROSCOPIC-ADD ON

## 2012-03-28 LAB — MAGNESIUM: Magnesium: 2.2 mg/dL (ref 1.5–2.5)

## 2012-03-28 MED ORDER — PHYTONADIONE 5 MG PO TABS
5.0000 mg | ORAL_TABLET | Freq: Every day | ORAL | Status: AC
  Administered 2012-03-28 – 2012-03-29 (×2): 5 mg via ORAL
  Filled 2012-03-28 (×2): qty 1

## 2012-03-28 MED ORDER — ACETAMINOPHEN 325 MG PO TABS
650.0000 mg | ORAL_TABLET | Freq: Once | ORAL | Status: AC
  Administered 2012-03-29: 650 mg via ORAL
  Filled 2012-03-28: qty 2

## 2012-03-28 MED ORDER — PANTOPRAZOLE SODIUM 40 MG PO TBEC
40.0000 mg | DELAYED_RELEASE_TABLET | Freq: Every day | ORAL | Status: DC
  Administered 2012-03-28 – 2012-03-29 (×2): 40 mg via ORAL
  Filled 2012-03-28 (×3): qty 1

## 2012-03-28 MED ORDER — FUROSEMIDE 10 MG/ML IJ SOLN
40.0000 mg | Freq: Once | INTRAMUSCULAR | Status: AC
  Administered 2012-03-28: 40 mg via INTRAVENOUS
  Filled 2012-03-28: qty 4

## 2012-03-28 NOTE — Progress Notes (Signed)
INFECTIOUS DISEASE PROGRESS NOTE  ID: Keith Bell is a 61 y.o. 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    Subjective: No acute events, bronchoscopy tomorrow.  Abtx:  Anti-infectives     Start     Dose/Rate Route Frequency Ordered Stop   03/25/12 1100   vancomycin (VANCOCIN) 750 mg in sodium chloride 0.9 % 150 mL IVPB  Status:  Discontinued        750 mg 150 mL/hr over 60 Minutes Intravenous Every 48 hours 03/23/12 1452 03/25/12 0945   03/25/12 1000   vancomycin (VANCOCIN) 750 mg in sodium chloride 0.9 % 150 mL IVPB  Status:  Discontinued        750 mg 150 mL/hr over 60 Minutes Intravenous Every 24 hours 03/25/12 0945 03/26/12 1444   03/23/12 2100  piperacillin-tazobactam (ZOSYN) IVPB 3.375 g       3.375 g 12.5 mL/hr over 240 Minutes Intravenous Every 8 hours 03/23/12 1452     03/23/12 1115   vancomycin (VANCOCIN) IVPB 1000 mg/200 mL premix     Comments: BEGIN AFTER BLOOD CULTURES DRAWN      1,000 mg 200 mL/hr over 60 Minutes Intravenous  Once 03/23/12 1109 03/23/12 1238   03/23/12 1115  piperacillin-tazobactam (ZOSYN) IVPB 3.375 g    Comments: BEGIN AFTER BLOOD CULTURES DRAWN     3.375 g 12.5 mL/hr over 240 Minutes Intravenous  Once 03/23/12 1109 03/23/12 1641          Medications: I have reviewed the patient's current medications.  Objective: Vital signs in last 24 hours: Temp:  [97.9 F (36.6 C)-98.2 F (36.8 C)] 97.9 F (36.6 C) (08/14 1447) Pulse Rate:  [108-119] 108  (08/14 1447) Resp:  [20-24] 20  (08/14 1447) BP: (107-118)/(72-78) 110/75 mmHg (08/14 1447) SpO2:  [92 %-98 %] 94 % (08/14 1447)   General appearance: no distress Resp: clear to auscultation bilaterally Cardio: regular rate and rhythm, S1, S2 normal, no murmur, click, rub or gallop  Lab Results  Basename 03/28/12 0410 03/27/12 0450  WBC 11.1* 8.8  HGB 10.2* 10.5*  HCT 29.0* 30.4*  NA 144 139  K 3.6 3.8  CL 113*  108  CO2 17* 17*  BUN 29* 27*  CREATININE 2.32* 2.25*  GLU -- --   Liver Panel No results found for this basename: PROT:2,ALBUMIN:2,AST:2,ALT:2,ALKPHOS:2,BILITOT:2,BILIDIR:2,IBILI:2 in the last 72 hours Sedimentation Rate  Basename 03/26/12 0515  ESRSEDRATE 118*   C-Reactive Protein No results found for this basename: CRP:2 in the last 72 hours  Microbiology: Recent Results (from the past 240 hour(s))  CULTURE, BLOOD (ROUTINE X 2)     Status: Normal (Preliminary result)   Collection Time   03/23/12 11:24 AM      Component Value Range Status Comment   Specimen Description BLOOD PORTA CATH   Final    Special Requests BOTTLES DRAWN AEROBIC AND ANAEROBIC 3CC   Final    Culture  Setup Time 03/24/2012 01:00   Final    Culture     Final    Value:        BLOOD CULTURE RECEIVED NO GROWTH TO DATE CULTURE WILL BE HELD FOR 5 DAYS BEFORE ISSUING A FINAL NEGATIVE REPORT   Report Status PENDING   Incomplete   CULTURE, BLOOD (ROUTINE X 2)     Status: Normal (Preliminary result)   Collection Time   03/23/12 11:31 AM      Component Value Range Status  Comment   Specimen Description BLOOD LEFT HAND   Final    Special Requests BOTTLES DRAWN AEROBIC AND ANAEROBIC 4CC   Final    Culture  Setup Time 03/24/2012 01:00   Final    Culture     Final    Value:        BLOOD CULTURE RECEIVED NO GROWTH TO DATE CULTURE WILL BE HELD FOR 5 DAYS BEFORE ISSUING A FINAL NEGATIVE REPORT   Report Status PENDING   Incomplete   MRSA PCR SCREENING     Status: Normal   Collection Time   03/23/12  2:37 PM      Component Value Range Status Comment   MRSA by PCR NEGATIVE  NEGATIVE Final   CULTURE, EXPECTORATED SPUTUM-ASSESSMENT     Status: Normal   Collection Time   03/23/12  3:33 PM      Component Value Range Status Comment   Specimen Description SPUTUM   Final    Special Requests NONE   Final    Sputum evaluation     Final    Value: THIS SPECIMEN IS ACCEPTABLE. RESPIRATORY CULTURE REPORT TO FOLLOW.   Report Status  03/23/2012 FINAL   Final   CULTURE, RESPIRATORY     Status: Normal   Collection Time   03/23/12  4:05 PM      Component Value Range Status Comment   Specimen Description SPUTUM   Final    Special Requests NONE   Final    Gram Stain     Final    Value: NO WBC SEEN     NO SQUAMOUS EPITHELIAL CELLS SEEN     NO ORGANISMS SEEN   Culture     Final    Value: FEW FUNGUS (MOLD) ISOLATED, PROBABLE CONTAMINANT/COLONIZER (SAPROPHYTE). CONTACT MICROBIOLOGY IF FURTHER IDENTIFICATION REQUIRED [phone].   Report Status 03/26/2012 FINAL   Final     Studies/Results: Dg Chest 2 View  03/28/2012  *RADIOLOGY REPORT*  Clinical Data: Cough, follow up right upper lobe infiltrate  CHEST - 2 VIEW  Comparison: 03/26/2012  Findings: Cardiomediastinal silhouette is stable.  Stable right Port-A-Cath position.  Persistent right upper lobe consolidation. Worsening airspace disease in the right lower lobe.  Stable streaky left lower lobe atelectasis or infiltrate.  IMPRESSION:  Stable right Port-A-Cath position.  Persistent right upper lobe consolidation.  Worsening airspace disease in the right lower lobe. Stable streaky left lower lobe atelectasis or infiltrate.  Original Report Authenticated By: Natasha Mead, M.D.     Assessment/Plan: 1)  Pulmonary infiltrate - doubt infectious etiology.  CXR worse.  Will d/c antibiotics and await bronchoscopy results inclduing bacterial, fungal and AFB.    Keith Bell Infectious Diseases 03/28/2012, 3:31 PM

## 2012-03-28 NOTE — Progress Notes (Signed)
Name: Keith Bell MRN: [id number] DOB: 1950/10/01    LOS: 5  Referring Provider: [facility] Reason for Referral:  Pna/ shock  PULMONARY / CRITICAL CARE MEDICINE  HPI: 61 yo male former smoker with hx of Hodgkin's disease with persistent Rt upper lobe infiltrate.  Cultures: Sputum 8/9>>?few fungus MRSA 8/9 > neg BC x 2 8/9 >>> Urine strep 8/9 > neg Urine Legionella 8/9 > neg  Antibiotics: Zosyn 8/9 >>> Vanc 8/9 >>>8/12  Current Status: Still has intermittent cough.    Vital Signs: Temp:  [98 F (36.7 C)-98.2 F (36.8 C)] 98.1 F (36.7 C) (08/14 0555) Pulse Rate:  [112-119] 112  (08/14 0555) Resp:  [20-28] 20  (08/14 0555) BP: (97-118)/(59-81) 118/78 mmHg (08/14 0555) SpO2:  [92 %-98 %] 98 % (08/14 0555) FIO2:  RA  Physical Examination: General - no distress HEENT - no sinus tenderness Cardiac - s1s2 regular Chest - decreased breath sounds Rt upper lung region, no wheeze Abd - soft, non tender Ext - no edema Neuro - normal strength Skin - no rashes  Dg Chest 2 View  03/28/2012  *RADIOLOGY REPORT*  Clinical Data: Cough, follow up right upper lobe infiltrate  CHEST - 2 VIEW  Comparison: 03/26/2012  Findings: Cardiomediastinal silhouette is stable.  Stable right Port-A-Cath position.  Persistent right upper lobe consolidation. Worsening airspace disease in the right lower lobe.  Stable streaky left lower lobe atelectasis or infiltrate.  IMPRESSION:  Stable right Port-A-Cath position.  Persistent right upper lobe consolidation.  Worsening airspace disease in the right lower lobe. Stable streaky left lower lobe atelectasis or infiltrate.  Original Report Authenticated By: Natasha Mead, M.D.   Lab Results  Component Value Date   WBC 11.1* 03/28/2012   HGB 10.2* 03/28/2012   HCT 29.0* 03/28/2012   MCV 81.2 03/28/2012   PLT 22* 03/28/2012   Lab Results  Component Value Date   CREATININE 2.32* 03/28/2012   BUN 29* 03/28/2012   NA 144 03/28/2012   K 3.6 03/28/2012   CL  113* 03/28/2012   CO2 17* 03/28/2012   Lab Results  Component Value Date   ALT 10 03/16/2012   AST 11 03/16/2012   ALKPHOS 207* 03/16/2012   BILITOT 0.6 03/16/2012   Lab Results  Component Value Date   ESRSEDRATE 118* 03/26/2012   Lab Results  Component Value Date   INR 1.24 03/24/2012   INR 1.16 03/23/2012   INR 1.23 11/20/2010    ASSESSMENT AND PLAN  8/10 CT chest>>Rt upper lobe consolidation, centrilobular emphysema, new ASD lingula, mild ASD RLL  A: 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 P: Continue Abx per ID>>infectious cause seems less likely Will proceed with bronchoscopy>>scheduled for 10 am on 08/15 Consider trial of steroids after bronchoscopy  A: Thrombocytopenia. P: Will need to transfuse PLT prior to bronchoscopy D/c pepcid and change to protonix  A: Stage IV Hodgkin's disease. P: Oncology following  A: ?HCAP.  ?fungus/mold in sputum from 8/9. P: Abx per ID  A: Acute renal insufficiency with hx of Stage III CKD.  Non-gap metabolic acidosis. P: Per primary team  I have reviewed CXR findings with pt and his family.  I have explained procedure of bronchoscopy.  Risks were detailed as bleeding, infection, respiratory failure and pneumothorax.  Patient understands risks, and is agreeable to procedure.  Will need to give PLT transfusion in AM of 8/14 in anticipation of bronchoscopy at 10 am on 8/15.  Coralyn Helling, MD [facility] Pulmonary/Critical Care 03/28/2012, 10:59 AM Pager:  [phone] After 3pm call: [phone]

## 2012-03-28 NOTE — Consult Note (Signed)
HARDING THOMURE 03/28/2012 [facility] Requesting Physician:  Dr. Janee Morn    Reason for Consult:  Acute on chronic renal failure HPI: The patient is a 61 y.o. year-old with hx of Hodgkin's lymphoma, COPD and CKD stage III admitted on 8/9 with cough, SOB, subjective fever, pleuritic CP and N/V.  CXR suggested PNA. This is 3rd admission without diagnosis for RUL PNA. CT shows worsening. Seen by pulm and ID specialists.  Abx stopped by ID, for bronch tomorrow. ID notes no fever, no WBC, possible non-infectious. He has advanced stage cancer, stage IV B, and has been treated with 6 different regimens since dx in 2009.  Most recently was treated with Afinitor (everolimus), with some leukopenia as side effect. Last year in 2012 received bendamustine, Rituxan and Neulasta.   Date   Creat 2012  0.6-0.8  Feb  1.09 Mar  0.97 May  1.02 June  1.5-1.9 July  1.3-1.9 Admit 8/9 2.81 Today 8/14 2.32  6/25-6/28 > admitted for RUL PNA 7/10-7/15 > admitted for RUL PNA, "necrotizing", CT neg for obstructing bronchus. Creat 1.9 down to 1.4 on discharge with fluids. 7/22-7/26 > SOB, multifactorial. Put on 4 wk course of levaquin.   UA 8/9 > 100 prot, 0-2 rbc, no wbc, few bact Urine Na > 46 UCreat > 12.9 CT chest (noncontrast) > progression of dense RUL consolidation Renal US 8/10 > 11.4 and 10.8 cm, no hydro or mass   IV Vanc > 8/9 to stopped on 8/12 IV Zosyn > 8/9 to stopped today 8/14    Creatinine, Ser  Date/Time Value Range Status  03/28/2012  4:10 AM 2.32* 0.50 - 1.35 mg/dL Final  1/61/0960  4:54 AM 2.25* 0.50 - 1.35 mg/dL Final  0/98/1191  4:78 AM 2.21* 0.50 - 1.35 mg/dL Final  2/95/6213  0:86 PM 2.10* 0.50 - 1.35 mg/dL Final  5/78/4696  2:95 AM 2.19* 0.50 - 1.35 mg/dL Final  2/84/1324  4:01 AM 2.40* 0.50 - 1.35 mg/dL Final  0/09/7251  6:64 AM 2.81* 0.50 - 1.35 mg/dL Final  4/0/3474 25:95 AM 2.08* 0.50 - 1.35 mg/dL Final  6/38/7564  3:32 AM 1.55* 0.50 - 1.35 mg/dL Final  9/51/8841  6:60  AM 1.39* 0.50 - 1.35 mg/dL Final  02/12/1600  0:93 AM 1.54* 0.50 - 1.35 mg/dL Final  2/35/5732  2:02 PM 1.91* 0.50 - 1.35 mg/dL Final  5/42/7062  3:76 AM 1.83* 0.50 - 1.35 mg/dL Final  2/83/1517  6:16 AM 1.48* 0.50 - 1.35 mg/dL Final  0/73/7106  2:69 AM 1.55* 0.50 - 1.35 mg/dL Final  4/85/4627  0:35 AM 1.67* 0.50 - 1.35 mg/dL Final  0/04/3817 29:93 AM 1.77* 0.50 - 1.35 mg/dL Final  02/27/9677  9:38 PM 1.91* 0.50 - 1.35 mg/dL Final  08/15/7508  2:58 AM 1.64* 0.50 - 1.35 mg/dL Final  01/09/7823  2:35 AM 1.73* 0.50 - 1.35 mg/dL Final  3/61/4431  5:40 PM 1.96* 0.50 - 1.35 mg/dL Final  0/86/7619  5:09 PM 1.51* 0.50 - 1.35 mg/dL Final  11/08/7122  5:80 AM 1.02  0.50 - 1.35 mg/dL Final  9/98/3382  5:05 AM 0.96  0.50 - 1.35 mg/dL Final  10/22/7671 41:93 AM 0.97  0.50 - 1.35 mg/dL Final  02/21/239  9:73 PM 1.09  0.50 - 1.35 mg/dL Final  5/32/9924  2:68 AM 1.00  0.50 - 1.35 mg/dL Final  10/16/1960  2:29 AM 1.04  0.50 - 1.35 mg/dL Final  79/03/9210  9:41 AM 0.79  0.50 - 1.35 mg/dL Preliminary  74/0/8144  9:56 AM 0.79  0.50 - 1.35 mg/dL Final  29/52/8413 24:40 AM 0.96  0.50 - 1.35 mg/dL Preliminary  06/11/2535 10:32 AM 0.96  0.50 - 1.35 mg/dL Final  6/44/0347 42:59 AM 0.76  0.50 - 1.35 mg/dL Preliminary  5/63/8756 10:25 AM 0.76  0.50 - 1.35 mg/dL Final  4/33/2951  8:84 AM 0.62  0.50 - 1.35 mg/dL Final  1/66/0630 16:01 AM 0.67  0.50 - 1.35 mg/dL Final  0/93/2355  7:32 AM 0.69  0.50 - 1.35 mg/dL Final  09/16/5425  0:62 PM 0.72  0.50 - 1.35 mg/dL Final  3/76/2831 51:76 AM 0.64  0.50 - 1.35 mg/dL Final  1/60/7371  0:62 AM 0.72  0.50 - 1.35 mg/dL Final  01/21/4853  6:27 AM 0.78  0.50 - 1.35 mg/dL Final  0/35/0093  8:18 PM 0.90  0.50 - 1.35 mg/dL Final  09/23/9369  6:96 AM 0.68  0.50 - 1.35 mg/dL Preliminary  02/20/9380  9:36 AM 0.68  0.50 - 1.35 mg/dL Preliminary  0/08/7508  9:36 AM 0.68  0.50 - 1.35 mg/dL Final  09/19/8525 78:24 AM 0.65  0.40 - 1.50 mg/dL Preliminary  09/18/5359 44:31 AM 0.65  0.40 - 1.50 mg/dL Final    12/16/84  7:61 AM 0.79  0.4 - 1.5 mg/dL Final  04/20/931  6:71 AM 0.88  0.4 - 1.5 mg/dL Final  09/19/5807  9:83 AM 1.00  0.4 - 1.5 mg/dL Final  10/21/2503  3:97 PM 1.3  0.4 - 1.5 mg/dL Final  01/19/3418  3:79 PM 1.22  0.4 - 1.5 mg/dL Final  0/24/0973 53:29 AM 0.68  0.40 - 1.50 mg/dL Preliminary  05/08/2682 10:27 AM 0.68  0.40 - 1.50 mg/dL Preliminary  12/01/6220 10:27 AM 0.68  0.40 - 1.50 mg/dL Final  04/22/9891 11:94 PM 0.68  0.40 - 1.50 mg/dL Final  1/74/0814  4:81 PM 0.76  0.40 - 1.50 mg/dL Preliminary  8/56/3149  1:10 PM 0.76  0.40 - 1.50 mg/dL Final  02/13/6377  5:88 AM 0.69  0.40 - 1.50 mg/dL Final  5/0/2774 12:87 AM 0.84  0.40 - 1.50 mg/dL Final  86/76/7209  4:70 PM 0.96  0.40 - 1.50 mg/dL Final    Past Medical History:  Past Medical History  Diagnosis Date  . Dyslipidemia 2005  . Cancer     stomach  . Hypertension     No longer on BP pills x 4 years  . Hodgkin's lymphoma   . CKD (chronic kidney disease), stage III   . Anemia   . Iron deficiency anemia 02/08/2012    Past Surgical History:  Past Surgical History  Procedure Date  . No past surgeries     Family History:  Family History  Problem Relation Age of Onset  . Heart disease Mother   . Heart disease Father   . Cancer Father     multiple myeloma  . Cancer Sister     breast cancer  . Cancer Sister     breast cancer   Social History:  reports that he quit smoking about 8 weeks ago. He has never used smokeless tobacco. He reports that he does not drink alcohol or use illicit drugs.  Allergies:  Allergies  Allergen Reactions  . Aspirin Nausea Only    Home medications: Prior to Admission medications   Medication Sig Start Date End Date Taking? Authorizing Provider  albuterol (PROVENTIL HFA;VENTOLIN HFA) 108 (90 BASE) MCG/ACT inhaler Inhale 2 puffs into the lungs every 4 (four) hours as needed. For shortness of breath.  Yes Historical Provider, MD  amoxicillin-clavulanate (AUGMENTIN) 875-125 MG per tablet Take 1  tablet by mouth 2 (two) times daily. Therapy for 30 days. Pt's on day 20 of therapy   Yes Historical Provider, MD  everolimus (AFINITOR) 10 MG tablet Take 10 mg by mouth See admin instructions. Pt takes this every other day.   Yes Historical Provider, MD  HYDROcodone-acetaminophen (VICODIN) 5-500 MG per tablet Take 1 tablet by mouth every 6 (six) hours as needed. For pain. 03/14/12  Yes Gerarda Fraction Murinson, MD  HYDROcodone-homatropine [facility]) 5-1.5 MG/5ML syrup Take 5 mLs by mouth every 6 (six) hours as needed. cough   Yes Historical Provider, MD  Multiple Vitamin (MULTIVITAMIN WITH MINERALS) TABS Take 1 tablet by mouth daily.   Yes Historical Provider, MD  ondansetron (ZOFRAN) 8 MG tablet Take 8 mg by mouth every 8 (eight) hours as needed. For nausea 02/10/12  Yes Alison Murray, MD  pantoprazole (PROTONIX) 40 MG tablet Take 1 tablet (40 mg total) by mouth daily. 09/01/11 08/31/12 Yes Gerarda Fraction Murinson, MD  potassium chloride SA (K-DUR,KLOR-CON) 20 MEQ tablet Take 20 mEq by mouth daily.   Yes Historical Provider, MD  ranitidine (ZANTAC) 150 MG tablet Take 150 mg by mouth 2 (two) times daily as needed. Heartburn   Yes Historical Provider, MD  temazepam (RESTORIL) 30 MG capsule Take 1 capsule (30 mg total) by mouth at bedtime as needed. 03/14/12  Yes [name], MD    Inpatient medications:    . acetaminophen  650 mg Oral Once  . benzonatate  200 mg Oral TID  . dextromethorphan-guaiFENesin  2 tablet Oral BID  . docusate sodium  100 mg Oral BID  . feeding supplement  237 mL Oral BID BM  . megestrol  400 mg Oral BID  . multivitamin with minerals  1 tablet Oral Daily  . pantoprazole  40 mg Oral Q1200  . phytonadione  5 mg Oral Daily  . potassium chloride SA  20 mEq Oral Daily  . sodium bicarbonate  650 mg Oral TID  . sodium chloride  3 mL Intravenous Q12H  . DISCONTD: famotidine  20 mg Oral Daily  . DISCONTD: piperacillin-tazobactam (ZOSYN)  IV  3.375 g Intravenous Q8H    Review of  Systems Gen:  Denies headache, fever, chills, sweats.  No weight loss. HEENT:  No visual change, sore throat, difficulty swallowing. Resp:  No difficulty breathing, DOE.  No cough or hemoptysis. Cardiac:  No chest pain, orthopnea, PND.  Denies edema. GI:   Denies abdominal pain.   No nausea, vomiting, diarrhea.  No constipation. GU:  Denies difficulty or change in voiding.  No change in urine color.     MS:  Denies joint pain or swelling.   Derm:  Denies skin rash or itching.  No chronic skin conditions.  Neuro:   Denies focal weakness, memory problems, hx stroke or TIA.   Psych:  Denies symptoms of depression of anxiety.  No hallucination.    Labs: Basic Metabolic Panel:  Lab 03/28/12 1610 03/27/12 0450 03/26/12 0515 03/25/12 2056 03/25/12 0828 03/24/12 0323 03/23/12 0955  NA 144 139 144 145 148* 140 143  K 3.6 3.8 3.4* 3.4* 2.8* 3.7 3.2*  CL 113* 108 112 111 114* 111 110  CO2 17* 17* 19 19 21  14* 14*  GLUCOSE 77 122* 154* 131* 179* 100* 192*  BUN 29* 27* 27* 28* 31* 45* 51*  CREATININE 2.32* 2.25* 2.21* 2.10* 2.19* 2.40* 2.81*  ALB -- -- -- -- -- -- --  CALCIUM 8.2* 8.1* 7.8* 7.7* 7.5* 8.1* 9.5  PHOS -- -- -- -- -- -- --   Liver Function Tests: No results found for this basename: AST:3,ALT:3,ALKPHOS:3,BILITOT:3,PROT:3,ALBUMIN:3 in the last 168 hours No results found for this basename: LIPASE:3,AMYLASE:3 in the last 168 hours No results found for this basename: AMMONIA:3 in the last 168 hours CBC:  Lab 03/28/12 0410 03/27/12 0450 03/26/12 0515 03/25/12 2056 03/25/12 0828  WBC 11.1* 8.8 8.0 10.1 --  NEUTROABS 10.0* -- 6.6 -- 7.9*  HGB 10.2* 10.5* 9.7* 10.8* --  HCT 29.0* 30.4* 27.7* 30.9* --  MCV 81.2 80.6 81.0 80.9 --  PLT 22* 31* 40* 43* --   PT/INR: @labrcntip (inr:5) Cardiac Enzymes:  Lab 03/25/12 2055 03/25/12 0225 03/24/12 1840 03/23/12 0955  CKTOTAL 24 20 22  --  CKMB 1.7 1.5 1.7 --  CKMBINDEX -- -- -- --  TROPONINI <0.30 <0.30 <0.30 <0.30   CBG: No results  found for this basename: GLUCAP:5 in the last 168 hours  Iron Studies:  Lab 03/25/12 1054  IRON 10*  TIBC 78*  TRANSFERRIN --  FERRITIN 3967*    Xrays/Other Studies: Dg Chest 2 View  03/28/2012  *RADIOLOGY REPORT*  Clinical Data: Cough, follow up right upper lobe infiltrate  CHEST - 2 VIEW  Comparison: 03/26/2012  Findings: Cardiomediastinal silhouette is stable.  Stable right Port-A-Cath position.  Persistent right upper lobe consolidation. Worsening airspace disease in the right lower lobe.  Stable streaky left lower lobe atelectasis or infiltrate.  IMPRESSION:  Stable right Port-A-Cath position.  Persistent right upper lobe consolidation.  Worsening airspace disease in the right lower lobe. Stable streaky left lower lobe atelectasis or infiltrate.  Original Report Authenticated By: Natasha Mead, M.D.    Physical Exam:  Blood pressure 110/75, pulse 108, temperature 97.9 F (36.6 C), temperature source Oral, resp. rate 20, height 5' 6.14" (1.68 m), weight 57.7 kg (127 lb 3.3 oz), SpO2 94.00%.  Gen: thin AAM, coughing repeatedly, not in distress Skin: no rash, cyanosis HEENT:  EOMI, sclera anicteric, throat slightly dry Neck: no JVD, no bruits or LAN Chest: R sided rhonchi/coarse rales, L mostly clear Heart: regular, no rub or gallop, no sig murmur Abdomen: soft, scaphoid, nontender, +BS, no HSM Ext: +pitting pedal edema 1-2+ bilat and mild nonpitting of legs bilat Neuro: alert, Ox3, no focal deficit  Date   Creat 2012  0.6-0.8  Feb  1.09 Mar  0.97 May  1.02 June  1.5-1.9 July  1.3-1.9 Admit 8/9 2.81 Today 8/14 2.32  6/25-6/28 > admitted for RUL PNA 7/10-7/15 > admitted for RUL PNA, "necrotizing", CT neg for obstructing bronchus. Creat 1.9 down to 1.4 on discharge with fluids. 7/22-7/26 > SOB, multifactorial. Put on 4 wk course of levaquin.   UA 8/9 > 100 prot, 0-2 rbc, no wbc, few bact Urine Na > 46 UCreat > 12.9  Impression/Plan 1. Subacute progressive renal failure in  patient with advanced stage Hodgkin's lymphoma. Renal function was normal last year and has been progressively worsening it looks like since early this year. UA is essentially unremarkable. Everolimus (Afinitor) has increased Creat listed as a side effect, but don't see any reports of acute or chronic renal failure with this medication. However, do not see other cause, specifically no recent NSAID's, IV contrast, ACEI, hypotension. Could have AIN from outpatient augmentin. [place] in normal. Don't have a good explanation for renal failure other than possibly chemo side effect.  He is developing peripheral edema with fluids, no pulm edema. Check urine prot-creat ratio,  repeat UA, urine for eos, lasix IV 40 mg x 1 and hold fluids for now.  2. Pulmonary consolidation- this is a reported and worrisome side effect of everolimus (and cousin sirolimus), that is, "organizing PNA", or pulm infiltrates which are non-infectious and go away over several weeks holding medication. Discussed with Dr. Janee Morn. Would d/w oncology about this possibility.   3. COPD 4. Hodgkin's lymphoma, advanced.  Thanks for the referral, will follow.    Vinson Moselle  MD [facility] [phone] pgr    [phone] cell 03/28/2012, 3:58 PM

## 2012-03-28 NOTE — Progress Notes (Signed)
[facility] PROGRESS NOTE  Keith Bell ZOX:[id number] DOB: 11-12-1950 DOA: 03/23/2012 PCP: Eino Farber, MD  Assessment/Plan: Principal Problem:  *Sepsis Active Problems:  Hodgkin's lymphoma in relapse  COPD (chronic obstructive pulmonary disease)  Hyperlipidemia  Hypotension  Acute on chronic renal failure  Iron deficiency anemia  CKD (chronic kidney disease), stage III  Metabolic acidosis  Necrotizing pneumonia  SIRS (systemic inflammatory response syndrome)  HCAP (healthcare-associated pneumonia)  Thrombocytopenia  Hypomagnesemia  Hypokalemia  Hypernatremia  #1 hypotension Likely secondary to sepsis secondary to recurrent probable necrotizing pneumonia/healthcare associated pneumonia. Patient with systolic blood pressure in the 90s overnight. Systolic blood pressure improved.  Pro calcitonin was elevated at 13 on admission decreasing.  Sputum Gram stain and cultures with fungus ?? Contaminant.. Blood cultures are pending. Urine Legionella is negative. Urine strep pneumococcus is negative. dECREASE IV fluids. Vancomycin d/c'd per ID. Continue zosyn. PCCM following and appreciate input and recommendations. Decrease IVF to 50 cc/hr.  #2 recurrent necrotizing pneumonia/healthcare associated pneumonia/?inflammatory vs fungal vs noninfectiuos This is patient's third hospitalization for right upper lobe pneumonia first episode in June of 2013 second episode in July of 2013 with patient was diagnosed with a necrotizing pneumonia per pulmonary medicine. Patient was discharged home on Levaquin and augmentin for 3 weeks, however concern as to whether patient took full course of antibiotics. Patient is presenting with productive cough, chills, fever, chest x-ray consistent with worsening pneumonia. Sputum Gram stain and cultures are pending. Urine strep pneumococcus is negative. A urine Legionella is negative. Pro calcitonin levels were elevated at 13 on admission and now at 5.61  and trending down. Lactic acid level was 2.1 on admission and trending down. Will repeat pro calcitonin in the a.m. Sputum with some fungus however likely contaminant.Repeat CT of the chest with continued progression of severe consolidated pneumonia in the right upper lobe superimposed on severe centrilobular emphysema and also do a space disease in the lingula with a peripheral nodular component suggestive of a retinal same process in the right upper lobe, mild airspace disease in the right lower lobe. Pulmonary critical care is following and recommend probable bronchoscopy vs steriods. Continue empiric IV Zosyn. Vancomycin d/c'd per ID. Nebs as needed. Mucinex. Tessalon Perles. Ideally as this is patient's 3rd hospitalization and no etiolgy nay benefit from bronchoscopy, however pulmonary to decide based on CXR. CXR pending.Pulmonary and critical care following. ID ff and appreciate input and rxcs.   #3 sepsis Likely secondary to problem #2. Urinalysis is negative. Chest x-ray consistent with recurrent worsening pneumonia. CT of the chest with multilobar and pneumonia and progression of worsening recurrent right upper lobe pneumonia .Blood cultures are pending. Pro calcitonin level was elevated on admission at 13 and trending down. Lactic acid level was 2.1 and trending down. Urine strep pneumococcus is negative. Urine Legionella is negative. Sputum with some fungus however likely contaminant. Repeat pro calcitonin levels in the a.m. Continue empiric IV Zosyn. Vancomycin d/c'd per ID. Pulmonary and critical care medicine ff. ID ff  #4 acute on chronic kidney disease stage III Patient's baseline creatinine is approximately 1.4-1.7. Creatinine on admission was 2.8. Likely secondary to a prerenal azotemia. Creatinine improving with hydration and currently at 2.32 from 2.25 from 2.21 and trending back up. Renal ultrasound normal. Continue supportive care follow. Decrease IV fluids 50 cc per hour and follow.  Continue bicarbonate tablets. Renal consult pending. Follow.  #5 thrombocytopenia/Anemia Likely secondary to problem #3 and afinitor. Patient is status post 3 unit of packed red blood  cells. Afinitor has been discontinued. Hgb is now 10.5 from 9.7 from 7.6. LDH WNL.  Haptoglobin elevated at 409. Peripheral smear negative for schistocytes, however with elevated d dimer and fibrinogen. Doubt if in DIC. Platelets trending down and at 22, no overt bleeding. Will give 1 bag of pheresed platelets and vit K as PTT AND PT elevated. Discussed with Dr Arline Asp of heme/onc. Hematology/oncology ff and appreciate input and rxcs.  #6 COPD Stable. Nebs when necessary.  #7 metabolic acidosis Likely secondary to sepsis versus acute on chronic kidney disease stage III. Improved with HCO3 in IVF. D/C'd HCO3 2 days ago and started oral bicarbonate. Follow.  See problem #3.  #8 hypomagnesemia/hypokalemia Repleted.  #9 hypernatremia Improved with change of IV fluids. Continue NS and decrease rate.  #10 constipation Resolved with MiraLAX. Colace 100 mg twice a day.   #11 Hodgkin's lymphoma, relapse Afinitor has been discontinued secondary to his anemia, thrombocytopenia, sepsis. Hematology oncology is following.  #12 prophylaxis Pepcid for GI prophylaxis. SCDs for DVT prophylaxis.  #13: Code Status Discussed CODE STATUS with patient after reviewing Dr. Mamie Levers note from 03/17/2011. Patient is in agreement with a Limited code. No CPR. No mechanical ventilation. May use pressors or antiarrhythmics.   Code Status: Limited code. Family Communication: Updated patient and sister via telephone. Updated patient at bedside.  Disposition Plan: Transfer to telemetry. Home when medically stable.   Brief narrative: Very pleasant 61 yo with hx copd, hodgkins lymphoma, necrotizing pna presents to ED cc sob and cough. Information provided by painted. States symptoms began 3 days ago have persisted and worsened.  Associated symptoms include chills, subjective fever, pleuritic chest pain and sinus congestion as well as some nausea and 3 episodes of vomiting. Reports emesis clear without dk coffee ground substances. Denies dysuria/hematuria abdominal pain or diarrhea. In ED pt was tachycardic, hypotensive and chest xray concerning for pna. Of note pt was discharged from hospital 7/26 after 5 day stay for similar symptoms. Was discharge on levaquin to be taken for 3 week. [facility] asked to admit      Consultants:  Pulmonary: Dr Sherene Sires 03/24/12   Hematolgy/Oncology: Dr Darrold Span 03/24/12  Renal pending 03/28/12  Procedures:  CXR  03/23/12  Status post 1 unit of packed red blood cells on 03/23/2012  V/Q scan 03/24/12  CT Chest 03/24/12  Renal US 03/24/12  2 units PRBC  03/25/12  Platelets 1 bag pending 03/28/12  Antibiotics:  IV Vancomycin 03/23/12---> 03/26/12  IV Zosyn 03/23/12  HPI/Subjective: Patient states some improvement in SOB, COUGH, pleuritic CP. Per family patient not eating.  Objective: Filed Vitals:   03/27/12 1313 03/27/12 1324 03/27/12 2240 03/28/12 0555  BP:  112/81 107/72 118/78  Pulse:  119 119 112  Temp:  98 F (36.7 C) 98.2 F (36.8 C) 98.1 F (36.7 C)  TempSrc:  Oral Oral Oral  Resp:  22 24 20   Height:      Weight:      SpO2: 94% 93% 92% 98%    Intake/Output Summary (Last 24 hours) at 03/28/12 1009 Last data filed at 03/28/12 0600  Gross per 24 hour  Intake   1130 ml  Output   1176 ml  Net    -46 ml    Exam: General: Alert, awake, oriented x3, in no acute distress. Coughing. HEENT: No bruits, no goiter. Heart: Tachycardia, without murmurs, rubs, gallops. Lungs: Coarse BS in R base. Abdomen: Soft, nontender, nondistended, positive bowel sounds. Extremities: No clubbing cyanosis or edema with positive  pedal pulses. Neuro: Grossly intact, nonfocal.  Data Reviewed: Basic Metabolic Panel:  Lab 03/28/12 4098 03/27/12 0450 03/26/12 0515 03/25/12 2056 03/25/12 0828    NA 144 139 144 145 148*  K 3.6 3.8 3.4* 3.4* 2.8*  CL 113* 108 112 111 114*  CO2 17* 17* 19 19 21   GLUCOSE 77 122* 154* 131* 179*  BUN 29* 27* 27* 28* 31*  CREATININE 2.32* 2.25* 2.21* 2.10* 2.19*  CALCIUM 8.2* 8.1* 7.8* 7.7* 7.5*  MG 2.2 1.6 1.9 -- 1.2*  PHOS -- -- -- -- --   Liver Function Tests: No results found for this basename: AST:5,ALT:5,ALKPHOS:5,BILITOT:5,PROT:5,ALBUMIN:5 in the last 168 hours No results found for this basename: LIPASE:5,AMYLASE:5 in the last 168 hours No results found for this basename: AMMONIA:5 in the last 168 hours CBC:  Lab 03/28/12 0410 03/27/12 0450 03/26/12 0515 03/25/12 2056 03/25/12 0828  WBC 11.1* 8.8 8.0 10.1 9.7  NEUTROABS 10.0* -- 6.6 -- 7.9*  HGB 10.2* 10.5* 9.7* 10.8* 7.6*  HCT 29.0* 30.4* 27.7* 30.9* 21.5*  MCV 81.2 80.6 81.0 80.9 83.0  PLT 22* 31* 40* 43* 40*   Cardiac Enzymes:  Lab 03/25/12 2055 03/25/12 0225 03/24/12 1840 03/23/12 0955  CKTOTAL 24 20 22  --  CKMB 1.7 1.5 1.7 --  CKMBINDEX -- -- -- --  TROPONINI <0.30 <0.30 <0.30 <0.30   BNP (last 3 results)  Basename 03/24/12 1149 03/05/12 2100 02/22/12 1600  PROBNP 1538.0* 479.3* 1118.0*   CBG: No results found for this basename: GLUCAP:5 in the last 168 hours  Recent Results (from the past 240 hour(s))  CULTURE, BLOOD (ROUTINE X 2)     Status: Normal (Preliminary result)   Collection Time   03/23/12 11:24 AM      Component Value Range Status Comment   Specimen Description BLOOD PORTA CATH   Final    Special Requests BOTTLES DRAWN AEROBIC AND ANAEROBIC 3CC   Final    Culture  Setup Time 03/24/2012 01:00   Final    Culture     Final    Value:        BLOOD CULTURE RECEIVED NO GROWTH TO DATE CULTURE WILL BE HELD FOR 5 DAYS BEFORE ISSUING A FINAL NEGATIVE REPORT   Report Status PENDING   Incomplete   CULTURE, BLOOD (ROUTINE X 2)     Status: Normal (Preliminary result)   Collection Time   03/23/12 11:31 AM      Component Value Range Status Comment   Specimen Description  BLOOD LEFT HAND   Final    Special Requests BOTTLES DRAWN AEROBIC AND ANAEROBIC 4CC   Final    Culture  Setup Time 03/24/2012 01:00   Final    Culture     Final    Value:        BLOOD CULTURE RECEIVED NO GROWTH TO DATE CULTURE WILL BE HELD FOR 5 DAYS BEFORE ISSUING A FINAL NEGATIVE REPORT   Report Status PENDING   Incomplete   MRSA PCR SCREENING     Status: Normal   Collection Time   03/23/12  2:37 PM      Component Value Range Status Comment   MRSA by PCR NEGATIVE  NEGATIVE Final   CULTURE, EXPECTORATED SPUTUM-ASSESSMENT     Status: Normal   Collection Time   03/23/12  3:33 PM      Component Value Range Status Comment   Specimen Description SPUTUM   Final    Special Requests NONE   Final    Sputum  evaluation     Final    Value: THIS SPECIMEN IS ACCEPTABLE. RESPIRATORY CULTURE REPORT TO FOLLOW.   Report Status 03/23/2012 FINAL   Final   CULTURE, RESPIRATORY     Status: Normal   Collection Time   03/23/12  4:05 PM      Component Value Range Status Comment   Specimen Description SPUTUM   Final    Special Requests NONE   Final    Gram Stain     Final    Value: NO WBC SEEN     NO SQUAMOUS EPITHELIAL CELLS SEEN     NO ORGANISMS SEEN   Culture     Final    Value: FEW FUNGUS (MOLD) ISOLATED, PROBABLE CONTAMINANT/COLONIZER (SAPROPHYTE). CONTACT MICROBIOLOGY IF FURTHER IDENTIFICATION REQUIRED [phone].   Report Status 03/26/2012 FINAL   Final      Studies: Dg Chest 2 View  03/23/2012  *RADIOLOGY REPORT*  Clinical Data: Cough.  Pleurisy.  Short of breath.  History of cancer.  CHEST - 2 VIEW  Comparison: 03/05/2012.  Findings: Progressive airspace disease is present in the right upper lobe, best seen on the lateral view.  When comparing lateral views to prior chest radiograph 03/05/2012, there is continued/progressive consolidation of the right upper lobe.  Support apparatus appears unchanged.  There is some faint airspace disease in the left midlung as well.  Cardiopericardial silhouette  unchanged.  Right IJ power port appears similar.  IMPRESSION: Progressive consolidation of the right upper lobe, best seen on the lateral view.  Faint airspace opacity in the left midlung, which may also represent pneumonia.  Original Report Authenticated By: Andreas Newport, M.D.   Dg Chest 2 View  03/05/2012  *RADIOLOGY REPORT*  Clinical Data: Respiratory distress.  CHEST - 2 VIEW  Comparison: 02/27/2012.  Findings: The power port is stable.  The right upper lobe airspace process shows a slight decrease and consolidation.  The remainder the lungs are clear.  IMPRESSION: Persistent but slight improved right upper lobe pneumonia.  Original Report Authenticated By: P. Loralie Champagne, M.D.   Dg Chest 2 View  02/27/2012  *RADIOLOGY REPORT*  Clinical Data: Cough, congestion.  CHEST - 2 VIEW  Comparison: 02/22/2012  Findings: Right Port-A-Cath remains in place, unchanged.  Dense consolidation in the right upper lobe is stable.  Left lung is clear.  Heart is normal size.  No effusions.  IMPRESSION: Stable dense right upper lobe consolidation.  Original Report Authenticated By: Cyndie Chime, M.D.   Ct Chest Wo Contrast  02/23/2012  *RADIOLOGY REPORT*  Clinical Data: Right lower lobe infiltrate.  Non Hodgkin lymphoma. Shortness of breath.  CT CHEST WITHOUT CONTRAST  Technique:  Multidetector CT imaging of the chest was performed following the standard protocol without IV contrast.  Comparison: PET 12/21/2011. CT chest 12/18/2008.  Findings: No pathologically enlarged mediastinal or axillary lymph nodes.  The hilar regions are difficult to definitively evaluate without IV contrast.  Coronary artery calcification.  Heart size normal.  No pericardial effusion.  Emphysema.  Airspace consolidation with internal rounded lucencies involving the majority of the anterior and posterior segment of the right upper lobe.  Associated air bronchograms.  Additional consolidation is seen in the medial aspect of the right lower  lobe, with at least one air-fluid level (image 30), possibly with a previous site of bullous disease, as seen on 12/18/2008. Additional scattered peribronchovascular nodularity and mild consolidation in the right lower lobe, inferiorly.  7 mm subpleural left upper lobe nodule (image 36) is  unchanged from 12/18/2008, indicating a benign etiology.  No pleural fluid.  Airway is unremarkable.  Incidental imaging of the upper abdomen shows no acute findings. No worrisome lytic or sclerotic lesions.  IMPRESSION:  Right upper and right lower lobe pneumonia. Associated fluid in multiple bullous lesions in the right upper and right lower lobes.  Original Report Authenticated By: Reyes Ivan, M.D.   Dg Esophagus  03/08/2012  *RADIOLOGY REPORT*  Clinical Data: Dysphagia.  ESOPHOGRAM / BARIUM SWALLOW  Technique:  Combined double contrast and single contrast examination performed using effervescent crystals, thick barium liquid, and thin barium liquid.  Fluoroscopy time:  1.2 minutes.  Comparison: 02/23/2012  Findings:  The cervical and thoracic esophagus are patent.  No high- grade stricture or mass.  Normal motility of the esophagus.  The patient ingested a 13 mm barium tablet which easily passed through the esophagus and into the stomach.  No reflux identified.  IMPRESSION:  1.  Normal esophagram.  Original Report Authenticated By: Rosealee Albee, M.D.   Dg Abd 2 Views  02/26/2012  *RADIOLOGY REPORT*  Clinical Data: Nausea and vomiting.  Hodgkin's lymphoma.  COPD.  ABDOMEN - 2 VIEW  Comparison: PET of 12/21/2011.  Findings: Upright view abdomen and a supine view of the abdomen and pelvis.  Upright view abdomen demonstrates no free intraperitoneal air or significant air fluid levels.  Interstitial thickening at the lung bases without lobar consolidation.  Supine view of the abdomen pelvis demonstrates a large colonic stool burden.  Distal gas.  No bowel distention.  Minimal convex right lumbar spine curvature.   IMPRESSION: Colonic stool burden suggests constipation.  No acute findings.  Original Report Authenticated By: Consuello Bossier, M.D.    Scheduled Meds:    . acetaminophen  650 mg Oral Once  . benzonatate  200 mg Oral TID  . dextromethorphan-guaiFENesin  2 tablet Oral BID  . docusate sodium  100 mg Oral BID  . famotidine  20 mg Oral Daily  . feeding supplement  237 mL Oral BID BM  . magnesium sulfate 1 - 4 g bolus IVPB  2 g Intravenous Once  . megestrol  400 mg Oral BID  . multivitamin with minerals  1 tablet Oral Daily  . phytonadione  5 mg Oral Daily  . piperacillin-tazobactam (ZOSYN)  IV  3.375 g Intravenous Q8H  . potassium chloride SA  20 mEq Oral Daily  . sodium bicarbonate  650 mg Oral TID  . sodium chloride  3 mL Intravenous Q12H   Continuous Infusions:    . sodium chloride 75 mL/hr at 03/27/12 1008    Principal Problem:  *Sepsis Active Problems:  Hodgkin's lymphoma in relapse  COPD (chronic obstructive pulmonary disease)  Hyperlipidemia  Hypotension  Acute on chronic renal failure  Iron deficiency anemia  CKD (chronic kidney disease), stage III  Metabolic acidosis  Necrotizing pneumonia  SIRS (systemic inflammatory response syndrome)  HCAP (healthcare-associated pneumonia)  Thrombocytopenia  Hypomagnesemia  Hypokalemia  Hypernatremia    Time spent: 40 mins    [facility]  [facility] Pager [phone]. If 8PM-8AM, please contact night-coverage at [email or web address], password [facility] 03/28/2012, 10:09 AM  LOS: 5 days

## 2012-03-29 ENCOUNTER — Encounter (HOSPITAL_COMMUNITY)
Admission: EM | Disposition: A | Discharge status: Hospice | Payer: Self-pay | Source: Home / Self Care | Attending: Internal Medicine

## 2012-03-29 ENCOUNTER — Encounter (HOSPITAL_COMMUNITY): Payer: Self-pay

## 2012-03-29 ENCOUNTER — Inpatient Hospital Stay (HOSPITAL_COMMUNITY): Payer: Medicare Other

## 2012-03-29 DIAGNOSIS — C8190 Hodgkin lymphoma, unspecified, unspecified site: Secondary | ICD-10-CM

## 2012-03-29 DIAGNOSIS — J189 Pneumonia, unspecified organism: Secondary | ICD-10-CM

## 2012-03-29 DIAGNOSIS — D696 Thrombocytopenia, unspecified: Secondary | ICD-10-CM

## 2012-03-29 HISTORY — PX: VIDEO BRONCHOSCOPY: SHX5072

## 2012-03-29 LAB — CBC WITH DIFFERENTIAL/PLATELET
Basophils Absolute: 0 10*3/uL (ref 0.0–0.1)
HCT: 28.6 % — ABNORMAL LOW (ref 39.0–52.0)
Lymphs Abs: 0.5 10*3/uL — ABNORMAL LOW (ref 0.7–4.0)
MCV: 80.6 fL (ref 78.0–100.0)
Monocytes Relative: 5 % (ref 3–12)
Neutro Abs: 10.4 10*3/uL — ABNORMAL HIGH (ref 1.7–7.7)
RDW: 19.2 % — ABNORMAL HIGH (ref 11.5–15.5)
WBC: 12 10*3/uL — ABNORMAL HIGH (ref 4.0–10.5)

## 2012-03-29 LAB — CBC
MCH: 27.9 pg (ref 26.0–34.0)
MCHC: 34.4 g/dL (ref 30.0–36.0)
MCV: 81.2 fL (ref 78.0–100.0)
Platelets: 36 10*3/uL — ABNORMAL LOW (ref 150–400)
RBC: 3.51 MIL/uL — ABNORMAL LOW (ref 4.22–5.81)
RDW: 19.3 % — ABNORMAL HIGH (ref 11.5–15.5)

## 2012-03-29 LAB — BODY FLUID CELL COUNT WITH DIFFERENTIAL
Eos, Fluid: 1 %
Eos, Fluid: 1 %
Monocyte-Macrophage-Serous Fluid: 5 % — ABNORMAL LOW (ref 50–90)
Neutrophil Count, Fluid: 97 % — ABNORMAL HIGH (ref 0–25)
Total Nucleated Cell Count, Fluid: 2150 cu mm — ABNORMAL HIGH (ref 0–1000)
Total Nucleated Cell Count, Fluid: 3000 cu mm — ABNORMAL HIGH (ref 0–1000)

## 2012-03-29 LAB — BLOOD GAS, ARTERIAL
Acid-base deficit: 10.1 mmol/L — ABNORMAL HIGH (ref 0.0–2.0)
Expiratory PAP: 5
FIO2: 1 %
Inspiratory PAP: 10
pH, Arterial: 7.317 — ABNORMAL LOW (ref 7.350–7.450)
pO2, Arterial: 393 mmHg — ABNORMAL HIGH (ref 80.0–100.0)

## 2012-03-29 LAB — PROTIME-INR
INR: 1.31 (ref 0.00–1.49)
Prothrombin Time: 16.5 seconds — ABNORMAL HIGH (ref 11.6–15.2)

## 2012-03-29 LAB — BASIC METABOLIC PANEL
BUN: 34 mg/dL — ABNORMAL HIGH (ref 6–23)
CO2: 18 mEq/L — ABNORMAL LOW (ref 19–32)
Chloride: 112 mEq/L (ref 96–112)
Creatinine, Ser: 2.52 mg/dL — ABNORMAL HIGH (ref 0.50–1.35)

## 2012-03-29 LAB — MRSA PCR SCREENING: MRSA by PCR: NEGATIVE

## 2012-03-29 LAB — PROTEIN / CREATININE RATIO, URINE
Protein Creatinine Ratio: 2.24 — ABNORMAL HIGH (ref 0.00–0.15)
Total Protein, Urine: 73.4 mg/dL

## 2012-03-29 SURGERY — BRONCHOSCOPY, WITH FLUOROSCOPY
Anesthesia: Moderate Sedation | Laterality: Bilateral

## 2012-03-29 MED ORDER — PHENYLEPHRINE HCL 0.25 % NA SOLN
1.0000 | Freq: Four times a day (QID) | NASAL | Status: DC | PRN
  Filled 2012-03-29: qty 15

## 2012-03-29 MED ORDER — ALBUTEROL SULFATE (5 MG/ML) 0.5% IN NEBU: 2.5000 mg | INHALATION_SOLUTION | Freq: Once | RESPIRATORY_TRACT | Status: DC

## 2012-03-29 MED ORDER — ALBUTEROL SULFATE (5 MG/ML) 0.5% IN NEBU
2.5000 mg | INHALATION_SOLUTION | RESPIRATORY_TRACT | Status: AC
  Administered 2012-03-29: 2.5 mg via RESPIRATORY_TRACT

## 2012-03-29 MED ORDER — MIDAZOLAM HCL 10 MG/2ML IJ SOLN
INTRAMUSCULAR | Status: DC | PRN
  Administered 2012-03-29 (×4): 2 mg via INTRAVENOUS

## 2012-03-29 MED ORDER — SODIUM CHLORIDE 0.9 % IV SOLN
INTRAVENOUS | Status: DC
  Administered 2012-03-29: 10 mL/h via INTRAVENOUS

## 2012-03-29 MED ORDER — LIDOCAINE HCL (PF) 1 % IJ SOLN
INTRAMUSCULAR | Status: DC | PRN
  Administered 2012-03-29: 6 mL

## 2012-03-29 MED ORDER — SODIUM CHLORIDE 0.9 % IV SOLN: Freq: Once | INTRAVENOUS | Status: DC

## 2012-03-29 MED ORDER — SODIUM CHLORIDE 0.45 % IV SOLN
INTRAVENOUS | Status: DC
  Administered 2012-03-29 – 2012-03-30 (×2): via INTRAVENOUS

## 2012-03-29 MED ORDER — IPRATROPIUM BROMIDE 0.02 % IN SOLN: 0.5000 mg | Freq: Once | RESPIRATORY_TRACT | Status: DC

## 2012-03-29 MED ORDER — ALBUTEROL SULFATE (5 MG/ML) 0.5% IN NEBU
INHALATION_SOLUTION | RESPIRATORY_TRACT | Status: AC
  Filled 2012-03-29: qty 0.5

## 2012-03-29 MED ORDER — ALBUTEROL SULFATE (5 MG/ML) 0.5% IN NEBU
2.5000 mg | INHALATION_SOLUTION | Freq: Once | RESPIRATORY_TRACT | Status: AC
  Administered 2012-03-29: 2.5 mg via RESPIRATORY_TRACT

## 2012-03-29 MED ORDER — BUTAMBEN-TETRACAINE-BENZOCAINE 2-2-14 % EX AERO
1.0000 | INHALATION_SPRAY | Freq: Once | CUTANEOUS | Status: DC
  Filled 2012-03-29: qty 56

## 2012-03-29 MED ORDER — IPRATROPIUM BROMIDE 0.02 % IN SOLN
0.5000 mg | Freq: Once | RESPIRATORY_TRACT | Status: AC
  Administered 2012-03-29: 0.5 mg via RESPIRATORY_TRACT

## 2012-03-29 MED ORDER — LIDOCAINE HCL 2 % EX GEL
Freq: Once | CUTANEOUS | Status: DC
  Filled 2012-03-29: qty 5

## 2012-03-29 MED ORDER — FENTANYL CITRATE 0.05 MG/ML IJ SOLN
INTRAMUSCULAR | Status: DC | PRN
  Administered 2012-03-29 (×2): 50 ug via INTRAVENOUS

## 2012-03-29 NOTE — Significant Event (Signed)
Met with pt's family after procedure.  Explained findings of procedure.  Explained that some test results may take several weeks to finalize, but that he would need to stay in hospital for that time.  They would like to have him shift to [place], [place] area to stay close to rest of family.  They are worried he will not be able to take care of himself after hospital d/c if he goes home.  Advised them to discuss this in more detail with hospitalist and case manager/social worker.  Coralyn Helling, MD [facility] Pulmonary/Critical Care 03/29/2012, 12:08 PM Pager:  [phone] After 3pm call: [phone]

## 2012-03-29 NOTE — Progress Notes (Signed)
Coralyn Helling, MD [facility] Pulmonary/Critical Care 03/29/2012, 11:12 AM Pager:  [phone] After 3pm call: [phone]

## 2012-03-29 NOTE — Significant Event (Signed)
Pt slow to recover from sedation, and developed respiratory distress while in recovery.  He had increased RR and b/l wheeze on exam.  Placed on BPAP and given albuterol tx.  Will arrange for further monitoring in SDU.  Dg Chest Port 1 View 03/29/2012  *RADIOLOGY REPORT*  Clinical Data: Status post bronchoscopy, respiratory distress  PORTABLE CHEST - 1 VIEW  Comparison: 03/28/2012  Findings: Cardiomediastinal silhouette is stable.  Stable right Port-A-Cath position.  Stable consolidation in the right upper lobe.  Mild improved aeration in the right lower lobe.  Persistent patchy airspace disease right lower lobe and left midlung.  No diagnostic pneumothorax.  No convincing pulmonary edema.  IMPRESSION: Stable right Port-A-Cath position.  Stable consolidation in the right upper lobe.  Mild improved aeration in the right lower lobe. Persistent patchy airspace disease right lower lobe and left midlung.  No diagnostic pneumothorax.  No convincing pulmonary edema.  Original Report Authenticated By: Natasha Mead, M.D.    Coralyn Helling, MD [facility] Pulmonary/Critical Care 03/29/2012, 1:24 PM Pager:  [phone] After 3pm call: [phone]

## 2012-03-29 NOTE — Progress Notes (Signed)
Video bronchoscopy procedure performed. Bronchial washing intervention performed,  Brushings intervention performed.

## 2012-03-29 NOTE — Progress Notes (Signed)
Update: upon clicking on order for platelets saw that Dr. Craige Cotta wrote for them to be given at 6 am on 8/15. Will give transfusion this am.

## 2012-03-29 NOTE — Progress Notes (Signed)
[facility] PROGRESS NOTE  Keith Bell ZOX:[id number] DOB: [birth year] DOA: 03/23/2012 PCP: Eino Farber, MD  Assessment/Plan: Principal Problem:  *Sepsis Active Problems:  Hodgkin's lymphoma in relapse  COPD (chronic obstructive pulmonary disease)  Hyperlipidemia  Hypotension  Acute on chronic renal failure  Iron deficiency anemia  CKD (chronic kidney disease), stage III  Metabolic acidosis  Necrotizing pneumonia  SIRS (systemic inflammatory response syndrome)  HCAP (healthcare-associated pneumonia)  Thrombocytopenia  Hypomagnesemia  Hypokalemia  Hypernatremia  #1 hypotension Likely secondary to sepsis secondary to recurrent probable necrotizing pneumonia/healthcare associated pneumonia. Systolic blood pressure improved.  Pro calcitonin was elevated at 13 on admission decreasing.  Sputum Gram stain and cultures with fungus ?? Contaminant.. Blood cultures are pending. Urine Legionella is negative. Urine strep pneumococcus is negative.  Vancomycin d/c'd per ID. Zosyn d/c'd per ID. Patient s/p broncoscopy. PCCM following and appreciate input and recommendations. Continue supportive care.  #2 recurrent necrotizing pneumonia/healthcare associated pneumonia/?inflammatory vs fungal vs noninfectiuos vs BOOP This is patient's third hospitalization for right upper lobe pneumonia first episode in June of 2013 second episode in July of 2013 with patient was diagnosed with a necrotizing pneumonia per pulmonary medicine. Patient was discharged home on Levaquin and augmentin for 3 weeks, however concern as to whether patient took full course of antibiotics. Patient is presenting with productive cough, chills, fever, chest x-ray consistent with worsening pneumonia. Sputum Gram stain and cultures are pending. Urine strep pneumococcus is negative. A urine Legionella is negative. Pro calcitonin levels were elevated at 13 on admission and now at 5.61 and trending down. Lactic acid level was  2.1 on admission and trending down. Will repeat pro calcitonin in the a.m. Sputum with some fungus however likely contaminant.Repeat CT of the chest with continued progression of severe consolidated pneumonia in the right upper lobe superimposed on severe centrilobular emphysema and also do a space disease in the lingula with a peripheral nodular component suggestive of a retinal same process in the right upper lobe, mild airspace disease in the right lower lobe. Pulmonary critical care is following and patient s/p bronchoscopy.  IV Zosyn d/c'd. Vancomycin d/c'd per ID. Nebs as needed. Mucinex. Tessalon Perles.  CXR worsening. Pulmonary and critical care following. ID ff and appreciate input and rxcs.   #3 sepsis Likely secondary to problem #2. Urinalysis is negative. Chest x-ray consistent with recurrent worsening pneumonia. CT of the chest with multilobar and pneumonia and progression of worsening recurrent right upper lobe pneumonia .Blood cultures are pending. Pro calcitonin level was elevated on admission at 13 and trending down. Lactic acid level was 2.1 and trending down. Urine strep pneumococcus is negative. Urine Legionella is negative. Sputum with some fungus however likely contaminant.  IV Zosyn d/c'd per ID. Vancomycin d/c'd per ID. Pulmonary and critical care medicine ff. ID ff pt s/p bronchoscopy.  #4 acute on chronic kidney disease stage III Patient's baseline creatinine is approximately 1.4-1.7. Creatinine on admission was 2.8. Likely secondary to a prerenal azotemia initially. Renal function worsening with unknown etiology.?? Sec to chemo.. Creatinine worsening currently at 2.52 from 2.32 from 2.25 from 2.21 and trending back up. Renal ultrasound normal. Continue supportive care follow. Continue bicarbonate tablets. Renal ff.. Follow.  #5 thrombocytopenia/Anemia Likely secondary to problem #3 and afinitor. Patient is status post 3 unit of packed red blood cells. Afinitor has been  discontinued. Hgb is now 10.5 from 9.7 from 7.6. LDH WNL.  Haptoglobin elevated at 409. Peripheral smear negative for schistocytes, however with elevated d dimer  and fibrinogen. Doubt if in DIC. Platelets trending down and at 22, no overt bleeding. Will give 1 bag of pheresed platelets and vit K as PTT AND PT elevated. Discussed with Dr Arline Asp of heme/onc. Hematology/oncology ff and appreciate input and rxcs.  #6 COPD Stable. Nebs when necessary.  #7 metabolic acidosis Likely secondary to sepsis versus acute on chronic kidney disease stage III. Improved with HCO3 in IVF. D/C'd HCO3 3 days ago and started oral bicarbonate. Follow.  See problem #3.  #8 hypomagnesemia/hypokalemia Repleted.  #9 hypernatremia Improved with change of IV fluids. Continue NS and decrease rate.  #10 constipation Resolved with MiraLAX. Colace 100 mg twice a day.   #11 Hodgkin's lymphoma, relapse Afinitor has been discontinued secondary to his anemia, thrombocytopenia, sepsis. Hematology oncology is following.  #12 prophylaxis Pepcid for GI prophylaxis. SCDs for DVT prophylaxis.  #13: Code Status Discussed CODE STATUS with patient after reviewing Dr. Mamie Levers note from 03/17/2011. Patient is in agreement with a Limited code. No CPR. No mechanical ventilation. May use pressors or antiarrhythmics.   Code Status: Limited code. Family Communication: Updated patient and sister via telephone. Updated patient at bedside.  Disposition Plan: Transfer to telemetry. Home when medically stable.   Brief narrative: Very pleasant 61 yo with hx copd, hodgkins lymphoma, necrotizing pna presents to ED cc sob and cough. Information provided by painted. States symptoms began 3 days ago have persisted and worsened. Associated symptoms include chills, subjective fever, pleuritic chest pain and sinus congestion as well as some nausea and 3 episodes of vomiting. Reports emesis clear without dk coffee ground substances. Denies  dysuria/hematuria abdominal pain or diarrhea. In ED pt was tachycardic, hypotensive and chest xray concerning for pna. Of note pt was discharged from hospital 7/26 after 5 day stay for similar symptoms. Was discharge on levaquin to be taken for 3 week. [facility] asked to admit      Consultants:  Pulmonary: Dr Sherene Sires 03/24/12   Hematolgy/Oncology: Dr Darrold Span 03/24/12  Renal pending 03/28/12  Procedures:  CXR  03/23/12  Status post 1 unit of packed red blood cells on 03/23/2012  V/Q scan 03/24/12  CT Chest 03/24/12  Renal US 03/24/12  2 units PRBC  03/25/12  Platelets 1 bag pending 03/29/12  Antibiotics:  IV Vancomycin 03/23/12---> 03/26/12  IV Zosyn 03/23/12--->03/28/12  HPI/Subjective: Patient s/p broncoscopy and had some respiratory distress and in SDU. Patient coughing incessantly.  Objective: Filed Vitals:   03/29/12 1400 03/29/12 1415 03/29/12 1434 03/29/12 1600  BP: 152/104 124/90  135/88  Pulse:  110  108  Temp:  96.9 F (36.1 C)    TempSrc:  Axillary    Resp: 20 27  28   Height:  5\' 6"  (1.676 m)    Weight:  57.6 kg (126 lb 15.8 oz)    SpO2: 96% 100% 98% 100%    Intake/Output Summary (Last 24 hours) at 03/29/12 1625 Last data filed at 03/29/12 1600  Gross per 24 hour  Intake 419.67 ml  Output    805 ml  Net -385.33 ml    Exam: General: Alert, awake, with some confusion in no acute distress. Coughing. HEENT: No bruits, no goiter. Heart: Tachycardia, without murmurs, rubs, gallops. Lungs: Coarse BS  Abdomen: Soft, nontender, nondistended, positive bowel sounds. Extremities: No clubbing cyanosis or edema with positive pedal pulses. Neuro: Grossly intact, nonfocal.  Data Reviewed: Basic Metabolic Panel:  Lab 03/29/12 1610 03/28/12 0410 03/27/12 0450 03/26/12 0515 03/25/12 2056 03/25/12 0828  NA 144 144 139 144 145 --  K 3.6 3.6 3.8 3.4* 3.4* --  CL 112 113* 108 112 111 --  CO2 18* 17* 17* 19 19 --  GLUCOSE 121* 77 122* 154* 131* --  BUN 34* 29* 27* 27* 28* --    CREATININE 2.52* 2.32* 2.25* 2.21* 2.10* --  CALCIUM 8.2* 8.2* 8.1* 7.8* 7.7* --  MG -- 2.2 1.6 1.9 -- 1.2*  PHOS -- -- -- -- -- --   Liver Function Tests: No results found for this basename: AST:5,ALT:5,ALKPHOS:5,BILITOT:5,PROT:5,ALBUMIN:5 in the last 168 hours No results found for this basename: LIPASE:5,AMYLASE:5 in the last 168 hours No results found for this basename: AMMONIA:5 in the last 168 hours CBC:  Lab 03/29/12 0950 03/29/12 0510 03/28/12 0410 03/27/12 0450 03/26/12 0515 03/25/12 0828  WBC 12.3* 12.0* 11.1* 8.8 8.0 --  NEUTROABS -- 10.4* 10.0* -- 6.6 7.9*  HGB 9.8* 10.1* 10.2* 10.5* 9.7* --  HCT 28.5* 28.6* 29.0* 30.4* 27.7* --  MCV 81.2 80.6 81.2 80.6 81.0 --  PLT 36* 20* 22* 31* 40* --   Cardiac Enzymes:  Lab 03/25/12 2055 03/25/12 0225 03/24/12 1840 03/23/12 0955  CKTOTAL 24 20 22  --  CKMB 1.7 1.5 1.7 --  CKMBINDEX -- -- -- --  TROPONINI <0.30 <0.30 <0.30 <0.30   BNP (last 3 results)  Basename 03/24/12 1149 03/05/12 2100 02/22/12 1600  PROBNP 1538.0* 479.3* 1118.0*   CBG: No results found for this basename: GLUCAP:5 in the last 168 hours  Recent Results (from the past 240 hour(s))  CULTURE, BLOOD (ROUTINE X 2)     Status: Normal (Preliminary result)   Collection Time   03/23/12 11:24 AM      Component Value Range Status Comment   Specimen Description BLOOD PORTA CATH   Final    Special Requests BOTTLES DRAWN AEROBIC AND ANAEROBIC 3CC   Final    Culture  Setup Time 03/24/2012 01:00   Final    Culture     Final    Value:        BLOOD CULTURE RECEIVED NO GROWTH TO DATE CULTURE WILL BE HELD FOR 5 DAYS BEFORE ISSUING A FINAL NEGATIVE REPORT   Report Status PENDING   Incomplete   CULTURE, BLOOD (ROUTINE X 2)     Status: Normal (Preliminary result)   Collection Time   03/23/12 11:31 AM      Component Value Range Status Comment   Specimen Description BLOOD LEFT HAND   Final    Special Requests BOTTLES DRAWN AEROBIC AND ANAEROBIC 4CC   Final    Culture  Setup  Time 03/24/2012 01:00   Final    Culture     Final    Value:        BLOOD CULTURE RECEIVED NO GROWTH TO DATE CULTURE WILL BE HELD FOR 5 DAYS BEFORE ISSUING A FINAL NEGATIVE REPORT   Report Status PENDING   Incomplete   MRSA PCR SCREENING     Status: Normal   Collection Time   03/23/12  2:37 PM      Component Value Range Status Comment   MRSA by PCR NEGATIVE  NEGATIVE Final   CULTURE, EXPECTORATED SPUTUM-ASSESSMENT     Status: Normal   Collection Time   03/23/12  3:33 PM      Component Value Range Status Comment   Specimen Description SPUTUM   Final    Special Requests NONE   Final    Sputum evaluation     Final    Value: THIS SPECIMEN IS ACCEPTABLE. RESPIRATORY  CULTURE REPORT TO FOLLOW.   Report Status 03/23/2012 FINAL   Final   CULTURE, RESPIRATORY     Status: Normal   Collection Time   03/23/12  4:05 PM      Component Value Range Status Comment   Specimen Description SPUTUM   Final    Special Requests NONE   Final    Gram Stain     Final    Value: NO WBC SEEN     NO SQUAMOUS EPITHELIAL CELLS SEEN     NO ORGANISMS SEEN   Culture     Final    Value: FEW FUNGUS (MOLD) ISOLATED, PROBABLE CONTAMINANT/COLONIZER (SAPROPHYTE). CONTACT MICROBIOLOGY IF FURTHER IDENTIFICATION REQUIRED [phone].   Report Status 03/26/2012 FINAL   Final      Studies: Dg Chest 2 View  03/23/2012  *RADIOLOGY REPORT*  Clinical Data: Cough.  Pleurisy.  Short of breath.  History of cancer.  CHEST - 2 VIEW  Comparison: 03/05/2012.  Findings: Progressive airspace disease is present in the right upper lobe, best seen on the lateral view.  When comparing lateral views to prior chest radiograph 03/05/2012, there is continued/progressive consolidation of the right upper lobe.  Support apparatus appears unchanged.  There is some faint airspace disease in the left midlung as well.  Cardiopericardial silhouette unchanged.  Right IJ power port appears similar.  IMPRESSION: Progressive consolidation of the right upper lobe,  best seen on the lateral view.  Faint airspace opacity in the left midlung, which may also represent pneumonia.  Original Report Authenticated By: Andreas Newport, M.D.   Dg Chest 2 View  03/05/2012  *RADIOLOGY REPORT*  Clinical Data: Respiratory distress.  CHEST - 2 VIEW  Comparison: 02/27/2012.  Findings: The power port is stable.  The right upper lobe airspace process shows a slight decrease and consolidation.  The remainder the lungs are clear.  IMPRESSION: Persistent but slight improved right upper lobe pneumonia.  Original Report Authenticated By: P. Loralie Champagne, M.D.   Dg Chest 2 View  02/27/2012  *RADIOLOGY REPORT*  Clinical Data: Cough, congestion.  CHEST - 2 VIEW  Comparison: 02/22/2012  Findings: Right Port-A-Cath remains in place, unchanged.  Dense consolidation in the right upper lobe is stable.  Left lung is clear.  Heart is normal size.  No effusions.  IMPRESSION: Stable dense right upper lobe consolidation.  Original Report Authenticated By: Cyndie Chime, M.D.   Ct Chest Wo Contrast  02/23/2012  *RADIOLOGY REPORT*  Clinical Data: Right lower lobe infiltrate.  Non Hodgkin lymphoma. Shortness of breath.  CT CHEST WITHOUT CONTRAST  Technique:  Multidetector CT imaging of the chest was performed following the standard protocol without IV contrast.  Comparison: PET 12/21/2011. CT chest 12/18/2008.  Findings: No pathologically enlarged mediastinal or axillary lymph nodes.  The hilar regions are difficult to definitively evaluate without IV contrast.  Coronary artery calcification.  Heart size normal.  No pericardial effusion.  Emphysema.  Airspace consolidation with internal rounded lucencies involving the majority of the anterior and posterior segment of the right upper lobe.  Associated air bronchograms.  Additional consolidation is seen in the medial aspect of the right lower lobe, with at least one air-fluid level (image 30), possibly with a previous site of bullous disease, as seen on  12/18/2008. Additional scattered peribronchovascular nodularity and mild consolidation in the right lower lobe, inferiorly.  7 mm subpleural left upper lobe nodule (image 36) is unchanged from 12/18/2008, indicating a benign etiology.  No pleural fluid.  Airway is unremarkable.  Incidental imaging of the upper abdomen shows no acute findings. No worrisome lytic or sclerotic lesions.  IMPRESSION:  Right upper and right lower lobe pneumonia. Associated fluid in multiple bullous lesions in the right upper and right lower lobes.  Original Report Authenticated By: Reyes Ivan, M.D.   Dg Esophagus  03/08/2012  *RADIOLOGY REPORT*  Clinical Data: Dysphagia.  ESOPHOGRAM / BARIUM SWALLOW  Technique:  Combined double contrast and single contrast examination performed using effervescent crystals, thick barium liquid, and thin barium liquid.  Fluoroscopy time:  1.2 minutes.  Comparison: 02/23/2012  Findings:  The cervical and thoracic esophagus are patent.  No high- grade stricture or mass.  Normal motility of the esophagus.  The patient ingested a 13 mm barium tablet which easily passed through the esophagus and into the stomach.  No reflux identified.  IMPRESSION:  1.  Normal esophagram.  Original Report Authenticated By: Rosealee Albee, M.D.   Dg Abd 2 Views  02/26/2012  *RADIOLOGY REPORT*  Clinical Data: Nausea and vomiting.  Hodgkin's lymphoma.  COPD.  ABDOMEN - 2 VIEW  Comparison: PET of 12/21/2011.  Findings: Upright view abdomen and a supine view of the abdomen and pelvis.  Upright view abdomen demonstrates no free intraperitoneal air or significant air fluid levels.  Interstitial thickening at the lung bases without lobar consolidation.  Supine view of the abdomen pelvis demonstrates a large colonic stool burden.  Distal gas.  No bowel distention.  Minimal convex right lumbar spine curvature.  IMPRESSION: Colonic stool burden suggests constipation.  No acute findings.  Original Report Authenticated By:  Consuello Bossier, M.D.    Scheduled Meds:    . sodium chloride   Intravenous Once  . acetaminophen  650 mg Oral Once  . albuterol  2.5 mg Nebulization Once  . albuterol  2.5 mg Nebulization NOW  . benzonatate  200 mg Oral TID  . butamben-tetracaine-benzocaine  1 spray Topical Once  . dextromethorphan-guaiFENesin  2 tablet Oral BID  . docusate sodium  100 mg Oral BID  . feeding supplement  237 mL Oral BID BM  . furosemide  40 mg Intravenous Once  . ipratropium  0.5 mg Nebulization Once  . lidocaine   Topical Once  . megestrol  400 mg Oral BID  . multivitamin with minerals  1 tablet Oral Daily  . pantoprazole  40 mg Oral Q1200  . phytonadione  5 mg Oral Daily  . potassium chloride SA  20 mEq Oral Daily  . sodium bicarbonate  650 mg Oral TID  . sodium chloride  3 mL Intravenous Q12H  . DISCONTD: albuterol  2.5 mg Nebulization Once  . DISCONTD: albuterol  2.5 mg Nebulization Once  . DISCONTD: ipratropium  0.5 mg Nebulization Once   Continuous Infusions:    . sodium chloride 50 mL/hr at 03/29/12 1600  . DISCONTD: sodium chloride 50 mL/hr (03/28/12 1123)  . DISCONTD: sodium chloride 10 mL/hr (03/29/12 0947)    Principal Problem:  *Sepsis Active Problems:  Hodgkin's lymphoma in relapse  COPD (chronic obstructive pulmonary disease)  Hyperlipidemia  Hypotension  Acute on chronic renal failure  Iron deficiency anemia  CKD (chronic kidney disease), stage III  Metabolic acidosis  Necrotizing pneumonia  SIRS (systemic inflammatory response syndrome)  HCAP (healthcare-associated pneumonia)  Thrombocytopenia  Hypomagnesemia  Hypokalemia  Hypernatremia    Time spent: 40 mins    [facility]  [facility] Pager [phone]. If 8PM-8AM, please contact night-coverage at [email or web address], password [email or web address] 03/29/2012, 4:25 PM  LOS: 6  days

## 2012-03-29 NOTE — Procedures (Signed)
Bronchoscopy Procedure Note Keith Bell [id number] 12-Dec-1950  Procedure: Bronchoscopy Indications: Obtain specimens for culture and/or other diagnostic studies  Procedure Details Consent: Risks of procedure as well as the alternatives and risks of each were explained to the (patient/caregiver).  Consent for procedure obtained. Time Out: Verified patient identification, verified procedure, site/side was marked, verified correct patient position, special equipment/implants available, medications/allergies/relevent history reviewed, required imaging and test results available.  Performed  In preparation for procedure, bronchoscope lubricated. Sedation: Benzodiazepines and Opiates.  Airway entered and the following bronchi were examined: RUL, RML, RLL, LUL and LLL.   Procedures performed: Brushings performed Bronchoscope removed.    Evaluation Hemodynamic Status: BP stable throughout; O2 sats: stable throughout Patient's Current Condition: stable Specimens:  Sent purulent fluid Complications: No apparent complications Patient did tolerate procedure well.  61 yo male with hx of Hodgkin's lymphoma and persistent pulmonary infiltrate in Rt upper lobe since June 2013.  Procedure was explained to pt.  Risks detailed as bleeding, infection, pneumothorax, respiratory failure, and non-diagnosis.  Consent was signed.  Procedure done in respiratory lab.  Pt given 8 mg versed and 100 mcg fentanyl IV for sedation and analgesia.  Bronchoscope was entered orally.  Vocal cords visualized and had mild inflammation (?if he has reflux).  Normal movement of vocal cords.  Ten ml of 1% lidocaine instilled in total for topical anesthesia of airways.   Bronchoscope entered into trachea.  Carina visualized.  Thick clear to yellows/green secretions from both main bronchi.    Bronchoscope entered into the Lt main bronchus.  Thick secretions.  Mucosa normal.  Lt upper, lingular, and lower lobe orifices  visualized.  No endobronchial lesions.  BAL from Lt lingula>>60 ml saline in with 10 ml of thick, yellow to red secretions.  Bronchoscope entered into Rt main bronchus.  Thick secretions.  Mucosa normal.  Rt upper, middle, and lower lobe orifices visualized.  No endobronchial lesions.  BAL from Rt upper lobe>>120 ml saline in with 20 ml of thick, yellow to red secretions.  Then brushing from Rt upper lobe done.  Unable to do transbronchial biopsies since PLT count only 36K in spite of getting transfusion platelets.  No evidence for bleeding.  Bronchoscope withdrawn.  No immediate complications.  Will send specimens for cell count with differential, gram stain and culture, AFB smear and culture, fungal smear and culture, and cytology.  Coralyn Helling, MD [facility] Pulmonary/Critical Care 03/29/2012, 10:54 AM Pager:  [phone] After 3pm call: [phone]

## 2012-03-29 NOTE — Progress Notes (Signed)
Subjective: Had bronch today and was doing poorly afterwards with RR 50 > now is on BiPap. He is limited code, drugs only.   Objective Vital signs in last 24 hours: Filed Vitals:   03/29/12 1350 03/29/12 1400 03/29/12 1415 03/29/12 1434  BP: 138/91 152/104 124/90   Pulse:   110   Temp:   96.9 F (36.1 C)   TempSrc:   Axillary   Resp: 28 20 27    Height:   5\' 6"  (1.676 m)   Weight:   57.6 kg (126 lb 15.8 oz)   SpO2: 90% 96% 100% 98%   Weight change:   Intake/Output Summary (Last 24 hours) at 03/29/12 1454 Last data filed at 03/29/12 1400  Gross per 24 hour  Intake 919.67 ml  Output    630 ml  Net 289.67 ml   Labs: Basic Metabolic Panel:  Lab 03/29/12 1610 03/28/12 0410 03/27/12 0450 03/26/12 0515 03/25/12 2056 03/25/12 0828 03/24/12 0323  NA 144 144 139 144 145 148* 140  K 3.6 3.6 3.8 3.4* 3.4* 2.8* 3.7  CL 112 113* 108 112 111 114* 111  CO2 18* 17* 17* 19 19 21  14*  GLUCOSE 121* 77 122* 154* 131* 179* 100*  BUN 34* 29* 27* 27* 28* 31* 45*  CREATININE 2.52* 2.32* 2.25* 2.21* 2.10* 2.19* 2.40*  ALB -- -- -- -- -- -- --  CALCIUM 8.2* 8.2* 8.1* 7.8* 7.7* 7.5* 8.1*  PHOS -- -- -- -- -- -- --   Liver Function Tests: No results found for this basename: AST:3,ALT:3,ALKPHOS:3,BILITOT:3,PROT:3,ALBUMIN:3 in the last 168 hours No results found for this basename: LIPASE:3,AMYLASE:3 in the last 168 hours No results found for this basename: AMMONIA:3 in the last 168 hours CBC:  Lab 03/29/12 0950 03/29/12 0510 03/28/12 0410 03/27/12 0450 03/26/12 0515 03/25/12 0828  WBC 12.3* 12.0* 11.1* 8.8 -- --  NEUTROABS -- 10.4* 10.0* -- 6.6 7.9*  HGB 9.8* 10.1* 10.2* 10.5* -- --  HCT 28.5* 28.6* 29.0* 30.4* -- --  MCV 81.2 80.6 81.2 80.6 -- --  PLT 36* 20* 22* 31* -- --   PT/INR: @labrcntip (inr:5) Cardiac Enzymes:  Lab 03/25/12 2055 03/25/12 0225 03/24/12 1840 03/23/12 0955  CKTOTAL 24 20 22  --  CKMB 1.7 1.5 1.7 --  CKMBINDEX -- -- -- --  TROPONINI <0.30 <0.30 <0.30 <0.30    CBG: No results found for this basename: GLUCAP:5 in the last 168 hours  Iron Studies:  Lab 03/25/12 1054  IRON 10*  TIBC 78*  TRANSFERRIN --  FERRITIN 3967*    Physical Exam:  Blood pressure 124/90, pulse 110, temperature 96.9 F (36.1 C), temperature source Axillary, resp. rate 27, height 5\' 6"  (1.676 m), weight 57.6 kg (126 lb 15.8 oz), SpO2 98.00%.  Gen: thin AAM, in ICU, on bipap after bronch this am Skin: no rash, cyanosis  HEENT: EOMI, sclera anicteric, throat slightly dry  Neck: no JVD, no bruits or LAN  Chest: R sided rhonchi/coarse rales, L mostly clear  Heart: regular, no rub or gallop, no sig murmur  Abdomen: soft, scaphoid, nontender, +BS, no HSM  Ext: +pitting pedal edema 1-2+ bilat and mild nonpitting of legs and arms bilat  Neuro: somnolent,  no focal deficit   Date   Creat  2012   0.6-0.23 Sep 2011  1.22 Oct 2011 0.97  May   1.02  June   1.5-1.9  July   1.3-1.9  8/9    2.81  8/14   2.32  Today  2.5  UA 8/9 > 100 prot, 0-2 rbc, no wbc, few bact  Urine Na > 46  UCreat > 12.9    Urine prot:creat ratio = 2.2 gm/24hr  Impression/Plan  1. Renal failure, subacute over last 6 months in patient with advanced stage Hodgkin's lymphoma. +proteinuria = 2gm/24 hr, no hematuria. eGFR is around 30 mL/min.  No recent renal insults or toxins. Hodgkin's lymphoma has been associated with a number of glomerular disorders including minimal change disease, membranous GN, FSGS, MPGN and amyloidosis. 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 He could also have interstitial disease from lymphomatous infiltration.  Don't see any reports of Afinitor causing serious renal failure. Did not respond to lasix, has mild vol excess without pulm edema.  Poor candidate for renal biopsy or treatment, recommend supportive care as tolerated. Will resume IVF"s at a low rate.  2. Pulmonary consolidation- discussed with Dr. Arline Asp, He is going to hold the  Afinitor, 3. COPD 4. Hodgkin's lymphoma, advanced. Poor prognosis.  5. Limited code-  No CPR, no intubation and no defib. Drugs only.    Vinson Moselle  MD [employer] [phone] pgr    [phone] cell 03/29/2012, 2:54 PM

## 2012-03-29 NOTE — Progress Notes (Signed)
Order for platelets is unscheduled. Saw in order history that Dr. Ronnell Freshwater order was d/c'd and Dr. Craige Cotta placed an unscheduled order. Per Dr. Silverio Lay progress notes he intended pt to receive platelets on 8/14. MD on call, Dr, Frederico Hamman paged in order to clarify. States that platelets are often given in am before bronchoscopy and is not sure whether Dr Craige Cotta intended pt to have them in am of 8/15 instead. Told to call Dr. Craige Cotta at 7 a.m. to clarify.

## 2012-03-29 NOTE — Consult Note (Signed)
ONCOLOGY  HOSPITAL CONSULTATION NOTE  Keith Bell                                MR#: [id number]  DOB: 1950-08-30                        CSN#: [id number] Referring MD: [facility] Primary MD:   Reason for Consult: Hodgkin's Lymphoma  PATIENT IDENTIFICATION :Keith Bell is a 61 y.o. African American  male patient of Dr. Arline Asp with a history of Hodgkin's lymphoma, diagnosed in March 2009, stage IV with positive bone marrow on 11/21/2007.He had a biopsy of a retroperitoneal lymph node on 11/13/2007 to establish the diagnosis. At that time, he had intra-abdominal disease and splenomegaly. He was having night sweats and fevers so he was stage IVB. He received Adriamycin, Velban, DTIC and Decadron approximately 4 and a half cycles from 11/29/2007 through 04/03/2008 with only a partial response. Bleomycin was omitted because of Treshun's underlying COPD.  He then received Rituxan with COPP for 6 cycles from 04/18/2008 through February 2010. He had an initial partial response and then progressive disease. This was determined on 10/30/2008 by a PET-CT scan.   B) s/p RICE program for 6 cycles from 12/15/2008 through late August 2010 with an apparent complete remission.   C) recurrence of disease noted and treated with Doxil, Navelbine and gemcitabine for 8 and a half cycles from October 15, 2009 through April 21, 2010 with a partial response.   D) s/p brentuximab (Adcetris) from 05/25/2010 through 10/21/2010 with progressive disease.   E) Program switched to bendamustine, Rituxan and Neulasta on 11/16/2010 and received 9 cycles. Most recent course with that program was given on 07/18/2011. A PET scan on 03/11/2011 showed a complete response. A bone marrow on 03/31/2011 was negative.   F)08/02/2011 PET scan showed relapse. At that point, Menelik had received a total of 6 different chemotherapy regimens.   G) Patient started on Afinitor 10 mg daily on September 06, 2011.On February 5th, the white  count had dropped to 1.9 with an ANC of 0.5 and a platelet count of a 104,000. Afinitor was discontinued. Afinitor was restarted on 10/19/2011. By 03/20 the ANC was 0.8, and we once again Afinitor was held until April 3rd, when Afinitor was started at 10 mg every other day with fairly good tolerance.Afinitor placed on hold from 7/10-7/15/2013 due to a hospitalization for COPD exacerbation and necrotizing PNA. Afinitor resumed from 7/15 till 7/22 he had to be placed on hold again due to current hospitalization From 03/05/2012 through 03/09/2012 for shortness of breath in association with a drop in his hemoglobin to 7.6 requiring 2 units of packed red cells.He resumed Afinitor on 7/26, now placed on hold during this hospitalization, since 03/24/2012.      HPI: [name] was admitted on 8/10 with 3 day history increasing shortness of breath and  Productive tanned colored, thick  cough, accompanied by chills, subjective fever, and pleuritic chest pain. He also had nausea with 3 episodes of  emesis prior to admission.in the emergency department, the patient was tachycardic, hypotensive, felt to be secondary to SIRS, related likely to progressive consolidation on the right upper lobe and his history of necrotizing pneumonia. CT of the chest with contrast on 8/10 showed continued progression of severe consolidated pneumonia in the right upper lobe superimposed on severe central lobular emphysema, and  new  air space disease in the lingula with a peripheral nodular component which suggest progression of the same process in the right upper lobe.  Patient received fluid resuscitation, oxygen therapy, and IV antibiotics were given. Blood cultures were drawn prior, negative to date. He continued on Zosyn and vancomycin until 03/28/2012,since infectious since infectious etiology was doubtful.  His hemoglobin on admission was 8.0, requiring one unit of PRBCs, and another unit on 03/25/12. Status complicated by acute on  chronic renal insufficiency with a creatinine of 2.8 ( baseline is 1.3-1.5), and metabolic acidosis, requiring  Nephrology consultation.Renal ultrasound on 03/25/2012 was negative for abnormalities.No concrete explanation for renal failure rather than the possibility of chemotherapy side effect for renal failure was entertained. Patient had last been seen at our office on 03/16/2012, at which tine he was generally weak. He had lost more weight now down to about 115 pounds representing about another 7 pound weight loss over the past 2 months. He had  dyspnea on exertion despite good O2 sats. He was using nebulizer treatments twice daily. EOL discussion had been initiated, which at this time is Limited Code, with No CPR. No mechanical ventilation. May use pressors or antiarrhythmics.  Upon his admission, we were asked to see Mr. Waddill to evaluate the situation from the oncological standpoint, including the possibility of discontinuing Afinitor. due to persistent right upper lung ASD.Bronchoscopy is planned for today, 03/29/2012 to further inspect the area. A trial of steroids after bronchoscopy is considered. Platelets were transfused prior to the procedure, for they are 20,000 today.Peripheral smear negative for schistocytes. Due to elevated PT and PTT patient received on 8/14 Vitamin K 5 mg po and qd  despite no overt bleeding.     PMH:  1.Hodgkin's lymphoma as above.  2. Chronic obstructive pulmonary disease.  3. Peripheral vascular disease with claudication of right leg.  4. Stage III CKD, acute on chronic this hospitalization.  5. History of hypertension.  6. Dyslipidemia.  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  8. Weight loss.  9. Anemia felt to be most likely due to the anemia of the patient's cancer and recent right upper lobe pneumonia.  10.Right upper lobe pneumonia diagnosed by chest x-ray on 01/17/2012.He had 3 admissions to the hospital prior to the current hospitalization  for right upper lobe pneumonia. His first hospitalization was from 02/07/2012 through 02/10/2012. His second admission was from 02/22/2012 through 02/27/2012. It was during that admission that the patient was seen in consultation by Dr. Billy Fischer and the diagnosis of a necrotizing polymicrobial pneumonia was made. The patient was  placed on Augmentin and Levaquin. Recommendations were for prolonged treatment at least 4 weeks, perhaps longer. The patient also required admission to the hospital from 07/22 through 07/26 for shortness of breath in association with a drop in his hemoglobin down to 7.6. During that admission he received 2 units of packed red cells.  11. Regular tachycardia.  12. Thrombocytopenia 13. Chronic Hypokalemia   Surgeries:  Past Surgical History  Procedure Date  . No past surgeries     Allergies:  Allergies  Allergen Reactions  . Aspirin Nausea Only    Medications:   Prior to Admission:  Prescriptions prior to admission  Medication Sig Dispense Refill  . albuterol (PROVENTIL HFA;VENTOLIN HFA) 108 (90 BASE) MCG/ACT inhaler Inhale 2 puffs into the lungs every 4 (four) hours as needed. For shortness of breath.      Marland Kitchen amoxicillin-clavulanate (AUGMENTIN) 875-125 MG per tablet Take 1 tablet by  mouth 2 (two) times daily. Therapy for 30 days. Pt's on day 20 of therapy      . everolimus (AFINITOR) 10 MG tablet Take 10 mg by mouth See admin instructions. Pt takes this every other day.      Marland Kitchen HYDROcodone-acetaminophen (VICODIN) 5-500 MG per tablet Take 1 tablet by mouth every 6 (six) hours as needed. For pain.  30 tablet  1  . HYDROcodone-homatropine (HYCODAN) 5-1.5 MG/5ML syrup Take 5 mLs by mouth every 6 (six) hours as needed. cough      . Multiple Vitamin (MULTIVITAMIN WITH MINERALS) TABS Take 1 tablet by mouth daily.      . ondansetron (ZOFRAN) 8 MG tablet Take 8 mg by mouth every 8 (eight) hours as needed. For nausea      . pantoprazole (PROTONIX) 40 MG tablet Take 1  tablet (40 mg total) by mouth daily.  30 tablet  6  . potassium chloride SA (K-DUR,KLOR-CON) 20 MEQ tablet Take 20 mEq by mouth daily.      . ranitidine (ZANTAC) 150 MG tablet Take 150 mg by mouth 2 (two) times daily as needed. Heartburn      . temazepam (RESTORIL) 30 MG capsule Take 1 capsule (30 mg total) by mouth at bedtime as needed.  30 capsule  1    GEX:[id number], chlorproMAZINE, gi cocktail, HYDROcodone-acetaminophen, HYDROcodone-homatropine, ipratropium, nitroGLYCERIN, ondansetron, sodium chloride, temazepam  ROS: See HPI for significant positives. Rest of the ROS negative   Family History:    Family History  Problem Relation Age of Onset  . Heart disease Mother   . Heart disease Father   . Cancer Father     multiple myeloma  . Cancer Sister     breast cancer  . Cancer Sister     breast cancer    No family history of bleeding disorders.  Social History:  reports that he quit smoking about 8 weeks ago. He has never used smokeless tobacco. He reports that he does not drink alcohol or use illicit drugs.  Physical Exam    Filed Vitals:   03/29/12 0728  BP: 98/68  Pulse: 117  Temp: 98.6 F (37 C)  Resp: 24      General: 61 year old AAM in no acute distress A. and O. x3 well-developed, thin , chronically ill appearing HEENT: Normocephalic, atraumatic, PERRLA. Sclera anicteric Oral cavity without thrush or lesions.  Neck supple. no thyromegaly, no cervical or supraclavicular adenopathy  Lungs Decreased breath sounds on the R >L. Some expiratory  rhonchi, no rales.  Cardiac regular rate and rhythm normal S1-S2, no murmur , rubs or gallops. Distant sounds . Right Port A Cath negative for tenderness. Abdomen soft nontender , bowel sounds x4. No HSM. No masses palpable.  GU/rectal: deferred.  Extremities 1+ edema on the left lower extremity. No bruising or petechial rash . Skin dry.  Musculoskeletal: no spinal tenderness.  Neuro: Non Focal    Labs:  CBC   Lab  03/29/12 0510 03/28/12 0410 03/27/12 0450 03/26/12 0515 03/25/12 2056 03/25/12 0828  WBC 12.0* 11.1* 8.8 8.0 10.1 --  HGB 10.1* 10.2* 10.5* 9.7* 10.8* --  HCT 28.6* 29.0* 30.4* 27.7* 30.9* --  PLT 20* 22* 31* 40* 43* --  MCV 80.6 81.2 80.6 81.0 80.9 --  MCH 28.5 28.6 27.9 28.4 28.3 --  MCHC 35.3 35.2 34.5 35.0 35.0 --  RDW 19.2* 18.9* 18.9* 18.2* 17.6* --  LYMPHSABS 0.5* 0.3* -- 0.5* -- 0.5*  MONOABS 0.6 0.4 -- 0.4 -- 0.6  EOSABS 0.5 0.4 -- 0.5 -- 0.6  BASOSABS 0.0 0.0 -- 0.0 -- 0.1  BANDABS -- -- -- -- -- --     CMP    Lab 03/29/12 0510 03/28/12 0410 03/27/12 0450 03/26/12 0515 03/25/12 2056 03/25/12 0828  NA 144 144 139 144 145 --  K 3.6 3.6 3.8 3.4* 3.4* --  CL 112 113* 108 112 111 --  CO2 18* 17* 17* 19 19 --  GLUCOSE 121* 77 122* 154* 131* --  BUN 34* 29* 27* 27* 28* --  CREATININE 2.52* 2.32* 2.25* 2.21* 2.10* --  CALCIUM 8.2* 8.2* 8.1* 7.8* 7.7* --  MG -- 2.2 1.6 1.9 -- 1.2*  AST -- -- -- -- -- --  ALT -- -- -- -- -- --  ALKPHOS -- -- -- -- -- --  BILITOT -- -- -- -- -- --        Component Value Date/Time   BILITOT 0.6 03/16/2012 1055      Lab 03/29/12 0510 03/24/12 1600 03/23/12 0955  INR 1.31 1.24 1.16  PROTIME -- -- --    Anemia panel:  No results found for this basename: VITAMINB12:2,FOLATE:2,FERRITIN:2,TIBC:2,IRON:2,RETICCTPCT:2 in the last 72 hours   Imaging Studies:  Dg Chest 2 View  03/28/2012  *RADIOLOGY REPORT*  Clinical Data: Cough, follow up right upper lobe infiltrate  CHEST - 2 VIEW  Comparison: 03/26/2012  Findings: Cardiomediastinal silhouette is stable.  Stable right Port-A-Cath position.  Persistent right upper lobe consolidation. Worsening airspace disease in the right lower lobe.  Stable streaky left lower lobe atelectasis or infiltrate.  IMPRESSION:  Stable right Port-A-Cath position.  Persistent right upper lobe consolidation.  Worsening airspace disease in the right lower lobe. Stable streaky left lower lobe atelectasis or  infiltrate.  Original Report Authenticated By: Natasha Mead, M.D.   Dg Chest 2 View  03/23/2012  *RADIOLOGY REPORT*  Clinical Data: Cough.  Pleurisy.  Short of breath.  History of cancer.  CHEST - 2 VIEW  Comparison: 03/05/2012.  Findings: Progressive airspace disease is present in the right upper lobe, best seen on the lateral view.  When comparing lateral views to prior chest radiograph 03/05/2012, there is continued/progressive consolidation of the right upper lobe.  Support apparatus appears unchanged.  There is some faint airspace disease in the left midlung as well.  Cardiopericardial silhouette unchanged.  Right IJ power port appears similar.  IMPRESSION: Progressive consolidation of the right upper lobe, best seen on the lateral view.  Faint airspace opacity in the left midlung, which may also represent pneumonia.  Original Report Authenticated By: Andreas Newport, M.D.   Ct Chest Wo Contrast  03/24/2012   Comparison: Chest radiograph 03/23/2012, CT 02/23/2012, PET CT scan 12/21/2011  Findings: Compared to the CT of 02/23/2012, there is again demonstrated a dense pneumonia within the right upper lobe superimposed on severe central lobular emphysema.  This process involves more of the right upper lobe tahan prior and is now confluent throughout the right upper lobe and apex.  There is a new curvilinear gas collection posterior at the right lung apex which either represents a confluence of the severe central emphysema or a pneumothorax.  No significant volume loss associated this process. The air bronchograms seen in the right middle lobe on prior exam are filled.(Image 24).  There is new nodular branching air space disease within the lingula (image 40).  Likewise in the posterior right lower lobe there is increase in interlobular septal thickening and mild air space disease.  There is  small right effusion at the right lung base which is increased.  Central airways are normal.  There is a port in the right  chest wall.  No axillary supraclavicular adenopathy.  Small mediastinal lymph nodes similar to prior.  Esophagus is thick-walled.  Heart appears normal this noncontrast exam.  Review of the upper abdomen is somewhat limited due to high density barium within the splenic flexure the colon.  The spleen appears normal.  Limited view of the skeleton is unremarkable.  IMPRESSION:  1.  Continued progression of severe consolidated pneumonia in the right upper lobe superimposed on severe central lobular emphysema. 2.  Some retraction of the pleural edge at the posterior right lung apex which either represents  consolidation of central lobular emphysema or small pneumothorax. 3.  There is new air space disease in the lingula with a peripheral nodular component which suggest progression of the same process in the right upper lobe. 4.  Mild air space disease in the right lower lobe.  Original Report Authenticated By: Genevive Bi, M.D.   Ct Chest Wo Contrast   02/23/2012 Comparison: PET 12/21/2011. CT chest 12/18/2008. Findings: No pathologically enlarged mediastinal or axillary lymph nodes. The hilar regions are difficult to definitively evaluate without IV contrast. Coronary artery calcification. Heart size normal. No pericardial effusion. Emphysema. Airspace consolidation with internal rounded lucencies involving the majority of the anterior and posterior segment of the right upper lobe. Associated air bronchograms. Additional consolidation is seen in the medial aspect of the right lower lobe, with at least one air-fluid level (image 30), possibly with a previous site of bullous disease, as seen on 12/18/2008. Additional scattered peribronchovascular nodularity and mild consolidation in the right lower lobe, inferiorly. 7 mm subpleural left upper lobe nodule (image 36) is unchanged from 12/18/2008, indicating a benign etiology. No pleural fluid. Airway is unremarkable. Incidental imaging of the upper abdomen shows no  acute findings. No worrisome lytic or sclerotic lesions. IMPRESSION: Right upper and right lower lobe pneumonia. Associated fluid in multiple bullous lesions in the right upper and right lower lobes. Original Report Authenticated By: Reyes Ivan, M.D.   6. PET scan on 12/21/2011 showed minimal increase in the metabolic activity associated with a right hilar lymph node and a left periaortic lymph node. There was no evidence for any new adenopathy. There were no hypermetabolic retroperitoneal lymph nodes, or any abnormal activity within the spleen, pelvis or inguinal lymph nodes. No focal hypermetabolic activity to suggest skeletal metastasis.     US Renal  03/25/2012 Comparison: PET CT scan 12/21/2011  Findings:  Right Kidney = 11.4 cm.  No evidence of hydronephrosis, cyst, mass, or stone.  Left kidney = 10.8 cm.  No evidence of hydronephrosis, cyst, mass, or stone.  Bladder:  Bladder partially distended.  No irregularity identified.  IMPRESSION:  Normal renal ultrasound.  Original Report Authenticated By: Genevive Bi, M.D.      A/P: 61 y.o. male well known to Dr. Arline Asp  dating back over 4 years to March 2009 when he was diagnosed with stage IVB Hodgkin's lymphoma. He has exhausted virtually all of the systemic treatments with well-established efficacy against Hodgkin's lymphoma and is now on Afinitor which seemed to be holding his disease in check. Unfortunately the patient has had a three  admissions over the past since July of 2013 for a necrotizing pneumonia involving the right upper lobe for requiring  prolonged Augmentin therapy, which he was on until admission. This most recent admission to the hospital on  03/05/2012 was associated with a drop in the hemoglobin to 7.6,requiring transfusion of  of packed red cells. This low hemoglobin continues to be present, for which he had a new transfusion on 8/10 for a Hb of 8 and another unit on 8/11. His Platelets are very low as well, requiring  transfusion  prior to scheduled bronchoscopy.  Based on all these issues, we were informed of the patient's admission, to evaluate patient's overall status, and the need for Afinitor at this point. His current advance directives are Limited Code.   Dr. Arline Asp is to see the patient following this consult and an addendum is to be written. Thank you for the care given to this patient, and for notifying as of his admission.  [facility] 03/29/2012 7:56 AM   I appreciate all the help with Kalum's care from Dr. Janee Morn and the consultants who have seen him. Please refer to my most recent office note from 03/16/2012. Once again Justin has been hospitalized (his 4th admission by my count since June)  with respiratory symptoms related to a progressive pneumonic process which initially involved the right upper lobe and now appears to be involving the lingula despite extensive antibiotic treatment over the past 2 months. In addition, Tullio has developed progressive renal insufficiency, thrombocytopenia and coagulopathy. At this point, I don't know how to tie all of this together. His Hodgkin's disease appears to be stable by clinical parameters and from a PET scan carried out on 12/21/2011 which showed no significant changes from the PET scan carried out on 08/02/2011.  Deivi underwent a bronchoscopy earlier today in the hopes of clarifying our understanding of the pulmonary process which was previously felt to be a polymicrobial necrotizing pneumonia. At the time of onset in early June, a fairly typical pneumonia was diagnosed on the basis of a subacute clinical presentation characterized by fever, night sweats, cough, pleuritic pain involving the right scapula area and a chest x-ray showing consolidation with air bronchograms involving the right upper lobe. Unfortunately we have never obtained an organism. As Dr. Arlean Hopping has pointed out, everolimus (Afinitor) and analogs have been associated with various types of  pneumonitis. Afinitor was stopped on this admission due to thrombocytopenia, however we will not be restarting Afinitor until the current problems are resolved. I am not aware of any studies which show benefit from corticosteroids in treating the pneumonitis from Afinitor.  Gevork received one bag of platelets this morning prior to his bronchoscopy. There was a relatively small increase in the platelet count from 20,000 to 36,000. I don't think the thrombocytopenia is secondary to Afinitor. The thrombocytopenia may be immune-mediated i.e. ITP or possibly due to DIC, especially in view of the  elevated PT and PTT. We need to be sure that blood for these labs is not being obtained from a heparinized line or a Port-A-Cath.  A trial of oral vitamin K is being given and may provide some additional insight regarding the possibility of DIC. In any event, treatment as indicated for the thrombocytopenia will involve platelet transfusions for now and treatment for the coagulopathy will require FFP as indicated for bleeding, if not reversed by vitamin K.  Will continue to follow with you. Please call for any questions or concerns.   [name] 03/29/2012 9:29 PM

## 2012-03-30 ENCOUNTER — Other Ambulatory Visit: Payer: Medicare Other | Admitting: Lab

## 2012-03-30 ENCOUNTER — Inpatient Hospital Stay (HOSPITAL_COMMUNITY): Payer: Medicare Other

## 2012-03-30 ENCOUNTER — Encounter (HOSPITAL_COMMUNITY): Payer: Self-pay | Admitting: Pulmonary Disease

## 2012-03-30 DIAGNOSIS — A419 Sepsis, unspecified organism: Secondary | ICD-10-CM

## 2012-03-30 LAB — PREPARE PLATELET PHERESIS: Unit division: 0

## 2012-03-30 LAB — CBC WITH DIFFERENTIAL/PLATELET
Basophils Relative: 0 % (ref 0–1)
Eosinophils Relative: 4 % (ref 0–5)
Hemoglobin: 9.8 g/dL — ABNORMAL LOW (ref 13.0–17.0)
Lymphocytes Relative: 4 % — ABNORMAL LOW (ref 12–46)
MCH: 28.4 pg (ref 26.0–34.0)
Monocytes Relative: 4 % (ref 3–12)
Neutrophils Relative %: 88 % — ABNORMAL HIGH (ref 43–77)
Platelets: 20 10*3/uL — CL (ref 150–400)
RBC: 3.45 MIL/uL — ABNORMAL LOW (ref 4.22–5.81)
WBC: 10.8 10*3/uL — ABNORMAL HIGH (ref 4.0–10.5)

## 2012-03-30 LAB — RENAL FUNCTION PANEL
CO2: 16 mEq/L — ABNORMAL LOW (ref 19–32)
Chloride: 113 mEq/L — ABNORMAL HIGH (ref 96–112)
Creatinine, Ser: 2.83 mg/dL — ABNORMAL HIGH (ref 0.50–1.35)
GFR calc Af Amer: 26 mL/min — ABNORMAL LOW (ref >90)
GFR calc non Af Amer: 23 mL/min — ABNORMAL LOW (ref >90)
Potassium: 3.8 mEq/L (ref 3.5–5.1)

## 2012-03-30 LAB — CULTURE, BLOOD (ROUTINE X 2)
Culture: NO GROWTH
Culture: NO GROWTH

## 2012-03-30 MED ORDER — IPRATROPIUM BROMIDE 0.02 % IN SOLN
0.5000 mg | Freq: Two times a day (BID) | RESPIRATORY_TRACT | Status: DC
Start: 2012-03-30 — End: 2012-04-05
  Administered 2012-03-30 – 2012-04-05 (×13): 0.5 mg via RESPIRATORY_TRACT
  Filled 2012-03-30 (×13): qty 2.5

## 2012-03-30 MED ORDER — FUROSEMIDE 40 MG PO TABS
40.0000 mg | ORAL_TABLET | Freq: Once | ORAL | Status: AC
  Administered 2012-03-30: 40 mg via ORAL
  Filled 2012-03-30: qty 1

## 2012-03-30 MED ORDER — PREDNISONE 50 MG PO TABS
60.0000 mg | ORAL_TABLET | Freq: Every day | ORAL | Status: DC
  Administered 2012-03-30 – 2012-04-05 (×7): 60 mg via ORAL
  Filled 2012-03-30 (×9): qty 1

## 2012-03-30 MED ORDER — SODIUM BICARBONATE 650 MG PO TABS
1300.0000 mg | ORAL_TABLET | Freq: Three times a day (TID) | ORAL | Status: DC
  Administered 2012-03-30 – 2012-04-05 (×18): 1300 mg via ORAL
  Filled 2012-03-30 (×20): qty 2

## 2012-03-30 MED ORDER — CHLORHEXIDINE GLUCONATE 0.12 % MT SOLN
15.0000 mL | Freq: Two times a day (BID) | OROMUCOSAL | Status: DC
  Administered 2012-03-30 – 2012-04-05 (×13): 15 mL via OROMUCOSAL
  Filled 2012-03-30 (×17): qty 15

## 2012-03-30 MED ORDER — BIOTENE DRY MOUTH MT LIQD
15.0000 mL | Freq: Two times a day (BID) | OROMUCOSAL | Status: DC
  Administered 2012-03-30 – 2012-04-05 (×13): 15 mL via OROMUCOSAL

## 2012-03-30 MED ORDER — IPRATROPIUM BROMIDE 0.02 % IN SOLN
0.5000 mg | RESPIRATORY_TRACT | Status: DC | PRN
  Administered 2012-04-01 – 2012-04-04 (×3): 0.5 mg via RESPIRATORY_TRACT
  Filled 2012-03-30 (×3): qty 2.5

## 2012-03-30 MED ORDER — GUAIFENESIN-DM 100-10 MG/5ML PO SYRP
15.0000 mL | ORAL_SOLUTION | Freq: Four times a day (QID) | ORAL | Status: DC
  Administered 2012-03-30 – 2012-04-05 (×20): 15 mL via ORAL
  Filled 2012-03-30 (×7): qty 15

## 2012-03-30 MED ORDER — PANTOPRAZOLE SODIUM 40 MG PO PACK
40.0000 mg | PACK | Freq: Every day | ORAL | Status: DC
  Administered 2012-03-30 – 2012-04-05 (×7): 40 mg via ORAL
  Filled 2012-03-30 (×7): qty 20

## 2012-03-30 MED ORDER — ALBUTEROL SULFATE (5 MG/ML) 0.5% IN NEBU
2.5000 mg | INHALATION_SOLUTION | Freq: Two times a day (BID) | RESPIRATORY_TRACT | Status: DC
  Administered 2012-03-30 – 2012-04-05 (×13): 2.5 mg via RESPIRATORY_TRACT
  Filled 2012-03-30 (×13): qty 0.5

## 2012-03-30 MED ORDER — ALBUTEROL SULFATE (5 MG/ML) 0.5% IN NEBU
2.5000 mg | INHALATION_SOLUTION | RESPIRATORY_TRACT | Status: DC | PRN
  Administered 2012-04-01 – 2012-04-04 (×4): 2.5 mg via RESPIRATORY_TRACT
  Filled 2012-03-30 (×4): qty 0.5

## 2012-03-30 NOTE — Progress Notes (Signed)
I saw [name] today and spoke with his family. His case was  discussed with Dr. Janee Morn. I have also spoken with Drs. Sood and Tax adviser during the past few days. Keith Bell's overall condition remains tenuous but stable. He is now back on a regular medical floor. O2 saturation is in the high 90s with the patient on 4 L per minute by nasal cannula. Prednisone has been started today at a dose of 60 mg per day. The patient remains a limited code. This issue was discussed with the patient and his family this morning. He is no longer on antibiotics. Afinitor was stopped at the time of admission on 03/23/2012.  Despite the administration of oral vitamin K during the past 2 days, the pro time and PTT continue to increase. Albumin has fallen from 2.7 on 03/16/2012 to 1.3 on 03/30/2012 raising the possibility that the patient's coagulopathy may be secondary to hepatic dysfunction. DIC and the development of a circulating inhibitor are other possibilities for the coagulopathy. I am ordering another DIC panel, a mixing study and liver function tests. If these studies are most consistent with  factor deficiency and if the patient shows evidence of bleeding and /or abnormal bruising, he will need to receive fresh frozen plasma.  With regard to the patient's thrombocytopenia, it should be noted that the patient's platelet count was over 100,000 just 2 weeks ago on 03/16/2012. The patient had a slight and transient response to a platelet transfusion yesterday morning (20 to 36k) but this morning the platelet count was back to 20,000. The patient has a normal white count and is not neutropenic as he had been a few months ago from the Afinitor. Inspection of the peripheral smear today shows some large platelets, occasional schistocytes and a lot of burr cells. ITP is certainly a possibility. It will be interesting to see whether the patient's platelet count improves with prednisone. As stated before, the patient will need to be supported  with platelet transfusions in the event of bleeding or continued drop in the platelet count below its present level of 20,000.  Culture results from the bronchoscopy are still pending, however greater than 90% of the leukocytes from the specimens obtained were neutrophils. This finding still raises concerns about infection as etiology for the patient's pulmonary infiltrates. In the reported cases of Afinitor pneumonitis, bronchoalveolar lavage consisted mostly of lymphocytes or in some cases eosinophils.  Unfortunately the patient's BUN and creatinine continue to slowly increase along with the development of a metabolic acidosis. Clearly, the patient's prognosis does not appear favorable.  I will be off until Wednesday, August 21. The oncology service will continue to follow along with you. I am available by cell phone.  Keith Bell 03/30/2012 9:52 PM

## 2012-03-30 NOTE — Progress Notes (Signed)
[facility] PROGRESS NOTE  Keith Bell YQM:[id number] DOB: [birth year] DOA: 03/23/2012 PCP: Eino Farber, MD  Assessment/Plan: Principal Problem:  *Sepsis Active Problems:  Hodgkin's lymphoma in relapse  COPD (chronic obstructive pulmonary disease)  Hyperlipidemia  Hypotension  Acute on chronic renal failure  Iron deficiency anemia  CKD (chronic kidney disease), stage III  Metabolic acidosis  Necrotizing pneumonia  SIRS (systemic inflammatory response syndrome)  HCAP (healthcare-associated pneumonia)  Thrombocytopenia  Hypomagnesemia  Hypokalemia  Hypernatremia  #1 hypotension Likely secondary to sepsis secondary to recurrent probable necrotizing pneumonia/healthcare associated pneumonia. Systolic blood pressure improved.  Pro calcitonin was elevated at 13 on admission decreasing.  Sputum Gram stain and cultures with fungus ?? Contaminant.. Blood cultures are pending. Urine Legionella is negative. Urine strep pneumococcus is negative.  Vancomycin d/c'd per ID. Zosyn d/c'd per ID. Patient s/p broncoscopy with cultures pending. PCCM following and appreciate input and recommendations. Continue supportive care.  #2 recurrent necrotizing pneumonia/healthcare associated pneumonia/?inflammatory vs fungal vs noninfectiuos vs BOOP This is patient's third hospitalization for right upper lobe pneumonia first episode in June of 2013 second episode in July of 2013 with patient was diagnosed with a necrotizing pneumonia per pulmonary medicine. Patient was discharged home on Levaquin and augmentin for 3 weeks, however concern as to whether patient took full course of antibiotics. Patient is presenting with productive cough, chills, fever, chest x-ray consistent with worsening pneumonia. Sputum Gram stain and cultures are pending. Urine strep pneumococcus is negative. A urine Legionella is negative. Pro calcitonin levels were elevated at 13 on admission and now at 5.61 and trending down.  Lactic acid level was 2.1 on admission and trending down. Will repeat pro calcitonin in the a.m. Sputum with some fungus however likely contaminant.Repeat CT of the chest with continued progression of severe consolidated pneumonia in the right upper lobe superimposed on severe centrilobular emphysema and also do a space disease in the lingula with a peripheral nodular component suggestive of a retinal same process in the right upper lobe, mild airspace disease in the right lower lobe. Pulmonary critical care is following and patient s/p bronchoscopy.  IV Zosyn d/c'd. Vancomycin d/c'd per ID. Nebs as needed. Mucinex. Tessalon Perles.  CXR worsening. Pulmonary and critical care following. Patient is status post bronchoscopy which had the turbid-appearing fluid with elevated WBCs and elevated neutrophil count. Cultures are pending. Antibiotics discontinued for now. We'll await cultures. Pulmonary following and appreciate input and recommendations. ID ff and appreciate input and rxcs.   #3 sepsis Likely secondary to problem #2. Urinalysis is negative. Chest x-ray consistent with recurrent worsening pneumonia. CT of the chest with multilobar and pneumonia and progression of worsening recurrent right upper lobe pneumonia .Blood cultures are pending. Pro calcitonin level was elevated on admission at 13 and trending down. Lactic acid level was 2.1 and trending down. Urine strep pneumococcus is negative. Urine Legionella is negative. Sputum with some fungus however likely contaminant.  IV Zosyn d/c'd per ID. Vancomycin d/c'd per ID. patient is status post bronchoscopy with cultures pending however fluid noted to be brown/purulent with increased white blood cells turbid in nature with an elevated neutrophil count. All antibiotics have been discontinued at this time per ID. Pulmonary and critical care medicine ff. ID ff pt s/p bronchoscopy.  #4 acute on chronic kidney disease stage III Patient's baseline creatinine is  approximately 1.4-1.7. Creatinine on admission was 2.8. Likely secondary to a prerenal azotemia initially. Renal function worsening now likely secondary to Hodgkin's lymphoma causing some kind of  glomerulonephritis per renal. Patient is currently euvolemic. Renal function worsening with unknown etiology.?? Sec to chemo.. Creatinine worsening currently at 2.83 from 2.52 from 2.32 from 2.25 from 2.21 and trending back up. Renal ultrasound normal. Continue supportive care follow. Increase  bicarbonate tablets. Renal ff.. Follow.  #5 thrombocytopenia/Anemia Likely secondary to problem #3 and afinitor versus consumptive state.. Patient is status post 3 unit of packed red blood cells. Afinitor has been discontinued. Hgb is now 10.5 from 9.7 from 7.6. LDH WNL.  Haptoglobin elevated at 409. Peripheral smear negative for schistocytes, however with elevated d dimer and fibrinogen. ???DIC. Platelets trending down and at 20, no overt bleeding. Patient is status post 1 bag of pheresed platelets and vit K as PTT AND PT elevated. Discussed with Dr Arline Asp of heme/onc. If platelets drop below 20 we'll transfuse the back of platelets on the patient starts bleeding we'll transfuse a bag of platelets. Hematology/oncology ff and appreciate input and rxcs.  #6 COPD Stable. Nebs when necessary.  #7 metabolic acidosis Likely secondary to sepsis versus acute on chronic kidney disease stage III. Improved with HCO3 in IVF. D/C'd HCO3 4 days ago and started oral bicarbonate. Follow.  See problem #3.  #8 hypomagnesemia/hypokalemia Repleted.  #9 hypernatremia Improved with change of IV fluids. Continue NS and decrease rate.  #10 constipation Resolved with MiraLAX. Colace 100 mg twice a day.   #11 Hodgkin's lymphoma, relapse Afinitor has been discontinued secondary to his anemia, thrombocytopenia, sepsis. Hematology oncology is following.  #12 prophylaxis Pepcid for GI prophylaxis. SCDs for DVT prophylaxis.  #13: Code  Status Discussed CODE STATUS with patient after reviewing Dr. Mamie Levers note from 03/17/2011. Patient is in agreement with a Limited code. No CPR. No mechanical ventilation. May use pressors or antiarrhythmics.   Code Status: Limited code. Family Communication: Updated patient and sister via telephone. Updated patient at bedside.  Disposition Plan: Home when medically stable.   Brief narrative: Very pleasant 61 yo with hx copd, hodgkins lymphoma, necrotizing pna presents to ED cc sob and cough. Information provided by painted. States symptoms began 3 days ago have persisted and worsened. Associated symptoms include chills, subjective fever, pleuritic chest pain and sinus congestion as well as some nausea and 3 episodes of vomiting. Reports emesis clear without dk coffee ground substances. Denies dysuria/hematuria abdominal pain or diarrhea. In ED pt was tachycardic, hypotensive and chest xray concerning for pna. Of note pt was discharged from hospital 7/26 after 5 day stay for similar symptoms. Was discharge on levaquin to be taken for 3 week. [facility] asked to admit      Consultants:  Pulmonary: Dr Sherene Sires 03/24/12   Hematolgy/Oncology: Dr Darrold Span 03/24/12  Renal : Dr. Arlean Hopping 03/28/12  Procedures:  CXR  03/23/12  Status post 1 unit of packed red blood cells on 03/23/2012  V/Q scan 03/24/12  CT Chest 03/24/12  Renal US 03/24/12  2 units PRBC  03/25/12  Platelets 1 bag pending 03/29/12  Bronchial alveolar lavage 03/29/2012  Antibiotics:  IV Vancomycin 03/23/12---> 03/26/12  IV Zosyn 03/23/12--->03/28/12  HPI/Subjective: Patient resting in bed off BiPAP. Patient still with cough. Family at bedside.  Objective: Filed Vitals:   03/30/12 0400 03/30/12 0600 03/30/12 0755 03/30/12 0800  BP: 108/75  121/76   Pulse: 103 101 98   Temp: 97.6 F (36.4 C)   97.5 F (36.4 C)  TempSrc:    Oral  Resp: 25 26 28    Height:      Weight: 58.3 kg (128 lb 8.5 oz)  SpO2: 95% 95% 97%      Intake/Output Summary (Last 24 hours) at 03/30/12 0949 Last data filed at 03/30/12 0700  Gross per 24 hour  Intake 1092.17 ml  Output    375 ml  Net 717.17 ml    Exam: General: Sleeping but easily arousable. Coughing. HEENT: No bruits, no goiter. Heart: Tachycardia, without murmurs, rubs, gallops. Lungs: Coarse BS on the right Abdomen: Soft, nontender, nondistended, positive bowel sounds. Extremities: No clubbing cyanosis or edema with positive pedal pulses. Neuro: Grossly intact, nonfocal.  Data Reviewed: Basic Metabolic Panel:  Lab 03/30/12 1308 03/29/12 0510 03/28/12 0410 03/27/12 0450 03/26/12 0515 03/25/12 0828  NA 145 144 144 139 144 --  K 3.8 3.6 3.6 3.8 3.4* --  CL 113* 112 113* 108 112 --  CO2 16* 18* 17* 17* 19 --  GLUCOSE 50* 121* 77 122* 154* --  BUN 36* 34* 29* 27* 27* --  CREATININE 2.83* 2.52* 2.32* 2.25* 2.21* --  CALCIUM 8.0* 8.2* 8.2* 8.1* 7.8* --  MG -- -- 2.2 1.6 1.9 1.2*  PHOS 4.9* -- -- -- -- --   Liver Function Tests:  Lab 03/30/12 0345  AST --  ALT --  ALKPHOS --  BILITOT --  PROT --  ALBUMIN 1.3*   No results found for this basename: LIPASE:5,AMYLASE:5 in the last 168 hours No results found for this basename: AMMONIA:5 in the last 168 hours CBC:  Lab 03/30/12 0345 03/29/12 0950 03/29/12 0510 03/28/12 0410 03/27/12 0450 03/26/12 0515 03/25/12 0828  WBC 10.8* 12.3* 12.0* 11.1* 8.8 -- --  NEUTROABS 9.6* -- 10.4* 10.0* -- 6.6 7.9*  HGB 9.8* 9.8* 10.1* 10.2* 10.5* -- --  HCT 28.4* 28.5* 28.6* 29.0* 30.4* -- --  MCV 82.3 81.2 80.6 81.2 80.6 -- --  PLT 20* 36* 20* 22* 31* -- --   Cardiac Enzymes:  Lab 03/25/12 2055 03/25/12 0225 03/24/12 1840 03/23/12 0955  CKTOTAL 24 20 22  --  CKMB 1.7 1.5 1.7 --  CKMBINDEX -- -- -- --  TROPONINI <0.30 <0.30 <0.30 <0.30   BNP (last 3 results)  Basename 03/24/12 1149 03/05/12 2100 02/22/12 1600  PROBNP 1538.0* 479.3* 1118.0*   CBG: No results found for this basename: GLUCAP:5 in the last 168  hours  Recent Results (from the past 240 hour(s))  CULTURE, BLOOD (ROUTINE X 2)     Status: Normal   Collection Time   03/23/12 11:24 AM      Component Value Range Status Comment   Specimen Description BLOOD PORTA CATH   Final    Special Requests BOTTLES DRAWN AEROBIC AND ANAEROBIC 3CC   Final    Culture  Setup Time 03/24/2012 01:00   Final    Culture NO GROWTH 5 DAYS   Final    Report Status 03/30/2012 FINAL   Final   CULTURE, BLOOD (ROUTINE X 2)     Status: Normal   Collection Time   03/23/12 11:31 AM      Component Value Range Status Comment   Specimen Description BLOOD LEFT HAND   Final    Special Requests BOTTLES DRAWN AEROBIC AND ANAEROBIC 4CC   Final    Culture  Setup Time 03/24/2012 01:00   Final    Culture NO GROWTH 5 DAYS   Final    Report Status 03/30/2012 FINAL   Final   MRSA PCR SCREENING     Status: Normal   Collection Time   03/23/12  2:37 PM      Component  Value Range Status Comment   MRSA by PCR NEGATIVE  NEGATIVE Final   CULTURE, EXPECTORATED SPUTUM-ASSESSMENT     Status: Normal   Collection Time   03/23/12  3:33 PM      Component Value Range Status Comment   Specimen Description SPUTUM   Final    Special Requests NONE   Final    Sputum evaluation     Final    Value: THIS SPECIMEN IS ACCEPTABLE. RESPIRATORY CULTURE REPORT TO FOLLOW.   Report Status 03/23/2012 FINAL   Final   CULTURE, RESPIRATORY     Status: Normal   Collection Time   03/23/12  4:05 PM      Component Value Range Status Comment   Specimen Description SPUTUM   Final    Special Requests NONE   Final    Gram Stain     Final    Value: NO WBC SEEN     NO SQUAMOUS EPITHELIAL CELLS SEEN     NO ORGANISMS SEEN   Culture     Final    Value: FEW FUNGUS (MOLD) ISOLATED, PROBABLE CONTAMINANT/COLONIZER (SAPROPHYTE). CONTACT MICROBIOLOGY IF FURTHER IDENTIFICATION REQUIRED [phone].   Report Status 03/26/2012 FINAL   Final   CULTURE, BAL-QUANTITATIVE     Status: Normal (Preliminary result)   Collection Time    03/29/12 10:42 AM      Component Value Range Status Comment   Specimen Description BRONCHIAL ALVEOLAR LAVAGE LEFT   Final    Special Requests NONE   Final    Gram Stain PENDING   Incomplete    Colony Count PENDING   Incomplete    Culture NO GROWTH 1 DAY   Final    Report Status PENDING   Incomplete   CULTURE, BAL-QUANTITATIVE     Status: Normal (Preliminary result)   Collection Time   03/29/12 11:54 AM      Component Value Range Status Comment   Specimen Description BRONCHIAL ALVEOLAR LAVAGE RIGHT   Final    Special Requests NONE   Final    Gram Stain     Final    Value: FEW WBC PRESENT,BOTH PMN AND MONONUCLEAR     NO SQUAMOUS EPITHELIAL CELLS SEEN     NO ORGANISMS SEEN   Colony Count PENDING   Incomplete    Culture NO GROWTH 1 DAY   Final    Report Status PENDING   Incomplete   MRSA PCR SCREENING     Status: Normal   Collection Time   03/29/12  3:33 PM      Component Value Range Status Comment   MRSA by PCR NEGATIVE  NEGATIVE Final      Studies: Dg Chest 2 View  03/23/2012  *RADIOLOGY REPORT*  Clinical Data: Cough.  Pleurisy.  Short of breath.  History of cancer.  CHEST - 2 VIEW  Comparison: 03/05/2012.  Findings: Progressive airspace disease is present in the right upper lobe, best seen on the lateral view.  When comparing lateral views to prior chest radiograph 03/05/2012, there is continued/progressive consolidation of the right upper lobe.  Support apparatus appears unchanged.  There is some faint airspace disease in the left midlung as well.  Cardiopericardial silhouette unchanged.  Right IJ power port appears similar.  IMPRESSION: Progressive consolidation of the right upper lobe, best seen on the lateral view.  Faint airspace opacity in the left midlung, which may also represent pneumonia.  Original Report Authenticated By: Andreas Newport, M.D.   Dg Chest 2 View  03/05/2012  *  RADIOLOGY REPORT*  Clinical Data: Respiratory distress.  CHEST - 2 VIEW  Comparison: 02/27/2012.   Findings: The power port is stable.  The right upper lobe airspace process shows a slight decrease and consolidation.  The remainder the lungs are clear.  IMPRESSION: Persistent but slight improved right upper lobe pneumonia.  Original Report Authenticated By: P. Loralie Champagne, M.D.   Dg Chest 2 View  02/27/2012  *RADIOLOGY REPORT*  Clinical Data: Cough, congestion.  CHEST - 2 VIEW  Comparison: 02/22/2012  Findings: Right Port-A-Cath remains in place, unchanged.  Dense consolidation in the right upper lobe is stable.  Left lung is clear.  Heart is normal size.  No effusions.  IMPRESSION: Stable dense right upper lobe consolidation.  Original Report Authenticated By: Cyndie Chime, M.D.   Ct Chest Wo Contrast  02/23/2012  *RADIOLOGY REPORT*  Clinical Data: Right lower lobe infiltrate.  Non Hodgkin lymphoma. Shortness of breath.  CT CHEST WITHOUT CONTRAST  Technique:  Multidetector CT imaging of the chest was performed following the standard protocol without IV contrast.  Comparison: PET 12/21/2011. CT chest 12/18/2008.  Findings: No pathologically enlarged mediastinal or axillary lymph nodes.  The hilar regions are difficult to definitively evaluate without IV contrast.  Coronary artery calcification.  Heart size normal.  No pericardial effusion.  Emphysema.  Airspace consolidation with internal rounded lucencies involving the majority of the anterior and posterior segment of the right upper lobe.  Associated air bronchograms.  Additional consolidation is seen in the medial aspect of the right lower lobe, with at least one air-fluid level (image 30), possibly with a previous site of bullous disease, as seen on 12/18/2008. Additional scattered peribronchovascular nodularity and mild consolidation in the right lower lobe, inferiorly.  7 mm subpleural left upper lobe nodule (image 36) is unchanged from 12/18/2008, indicating a benign etiology.  No pleural fluid.  Airway is unremarkable.  Incidental imaging of  the upper abdomen shows no acute findings. No worrisome lytic or sclerotic lesions.  IMPRESSION:  Right upper and right lower lobe pneumonia. Associated fluid in multiple bullous lesions in the right upper and right lower lobes.  Original Report Authenticated By: Reyes Ivan, M.D.   Dg Esophagus  03/08/2012  *RADIOLOGY REPORT*  Clinical Data: Dysphagia.  ESOPHOGRAM / BARIUM SWALLOW  Technique:  Combined double contrast and single contrast examination performed using effervescent crystals, thick barium liquid, and thin barium liquid.  Fluoroscopy time:  1.2 minutes.  Comparison: 02/23/2012  Findings:  The cervical and thoracic esophagus are patent.  No high- grade stricture or mass.  Normal motility of the esophagus.  The patient ingested a 13 mm barium tablet which easily passed through the esophagus and into the stomach.  No reflux identified.  IMPRESSION:  1.  Normal esophagram.  Original Report Authenticated By: Rosealee Albee, M.D.   Dg Abd 2 Views  02/26/2012  *RADIOLOGY REPORT*  Clinical Data: Nausea and vomiting.  Hodgkin's lymphoma.  COPD.  ABDOMEN - 2 VIEW  Comparison: PET of 12/21/2011.  Findings: Upright view abdomen and a supine view of the abdomen and pelvis.  Upright view abdomen demonstrates no free intraperitoneal air or significant air fluid levels.  Interstitial thickening at the lung bases without lobar consolidation.  Supine view of the abdomen pelvis demonstrates a large colonic stool burden.  Distal gas.  No bowel distention.  Minimal convex right lumbar spine curvature.  IMPRESSION: Colonic stool burden suggests constipation.  No acute findings.  Original Report Authenticated By: Consuello Bossier,  M.D.    Scheduled Meds:    . sodium chloride   Intravenous Once  . albuterol  2.5 mg Nebulization Once  . albuterol  2.5 mg Nebulization NOW  . antiseptic oral rinse  15 mL Mouth Rinse q12n4p  . benzonatate  200 mg Oral TID  . butamben-tetracaine-benzocaine  1 spray Topical  Once  . chlorhexidine  15 mL Mouth Rinse BID  . dextromethorphan-guaiFENesin  2 tablet Oral BID  . docusate sodium  100 mg Oral BID  . feeding supplement  237 mL Oral BID BM  . ipratropium  0.5 mg Nebulization Once  . lidocaine   Topical Once  . megestrol  400 mg Oral BID  . multivitamin with minerals  1 tablet Oral Daily  . pantoprazole  40 mg Oral Q1200  . phytonadione  5 mg Oral Daily  . potassium chloride SA  20 mEq Oral Daily  . sodium bicarbonate  650 mg Oral TID  . sodium chloride  3 mL Intravenous Q12H  . DISCONTD: albuterol  2.5 mg Nebulization Once  . DISCONTD: albuterol  2.5 mg Nebulization Once  . DISCONTD: ipratropium  0.5 mg Nebulization Once   Continuous Infusions:    . sodium chloride 50 mL/hr at 03/29/12 1600  . DISCONTD: sodium chloride 10 mL/hr (03/29/12 0947)    Principal Problem:  *Sepsis Active Problems:  Hodgkin's lymphoma in relapse  COPD (chronic obstructive pulmonary disease)  Hyperlipidemia  Hypotension  Acute on chronic renal failure  Iron deficiency anemia  CKD (chronic kidney disease), stage III  Metabolic acidosis  Necrotizing pneumonia  SIRS (systemic inflammatory response syndrome)  HCAP (healthcare-associated pneumonia)  Thrombocytopenia  Hypomagnesemia  Hypokalemia  Hypernatremia    Time spent: 45 mins    [facility]  [facility] Pager [phone]. If 8PM-8AM, please contact night-coverage at [email or web address], password [facility] 03/30/2012, 9:49 AM  LOS: 7 days

## 2012-03-30 NOTE — Progress Notes (Signed)
Name: Keith Bell MRN: [id number] DOB: 1950/11/24    LOS: 7  Referring Provider: [facility] Reason for Referral:  Pna/ shock  PULMONARY / CRITICAL CARE MEDICINE  HPI: 61 yo male former smoker with hx of Hodgkin's disease with persistent Rt upper lobe infiltrate.  Cultures: Sputum 8/9>>?few fungus MRSA 8/9 > neg BC x 2 8/9 >>> Urine strep 8/9 > neg Urine Legionella 8/9 > neg  Antibiotics: Zosyn 8/9 >>>off Vanc 8/9 >>>8/12  Current Status: Feels better. Off BIPAP since am  Vital Signs: Temp:  [96.9 F (36.1 C)-97.7 F (36.5 C)] 97.5 F (36.4 C) (08/16 0800) Pulse Rate:  [98-125] 98  (08/16 0755) Resp:  [11-57] 28  (08/16 0755) BP: (98-184)/(64-123) 121/76 mmHg (08/16 0755) SpO2:  [75 %-100 %] 97 % (08/16 0755) FiO2 (%):  [45 %] 45 % (08/15 1415) Weight:  [57.6 kg (126 lb 15.8 oz)-58.3 kg (128 lb 8.5 oz)] 58.3 kg (128 lb 8.5 oz) (08/16 0400) FIO2:  RA  Physical Examination: General - no distress HEENT - no sinus tenderness Cardiac - s1s2 regular Chest - scattered rhonchi Abd - soft, non tender Ext - no edema Neuro - normal strength Skin - no rashes  Dg Chest 2 View  03/30/2012  *RADIOLOGY REPORT*  Clinical Data: Follow up pulmonary infiltrates.  Hypertension. Short of breath.  CHEST - 2 VIEW  Comparison: 03/29/2012.  Findings: Worsening aeration at the lung bases.  Progressive airspace disease, particularly at the cardiac apex.  Differential considerations include worsening pneumonia, edema, or aspiration. Combination of these densities could be present as well. Consolidation of the right upper lobe respecting the fissure. Grossly, the cardiopericardial silhouette appears unchanged.  IMPRESSION:  1.  Unchanged appearance of the right upper lobe. 2.  Marked worsening of basilar airspace disease as described above.  Original Report Authenticated By: Andreas Newport, M.D.   Dg Chest 2 View  03/28/2012  *RADIOLOGY REPORT*  Clinical Data: Cough, follow up right upper  lobe infiltrate  CHEST - 2 VIEW  Comparison: 03/26/2012  Findings: Cardiomediastinal silhouette is stable.  Stable right Port-A-Cath position.  Persistent right upper lobe consolidation. Worsening airspace disease in the right lower lobe.  Stable streaky left lower lobe atelectasis or infiltrate.  IMPRESSION:  Stable right Port-A-Cath position.  Persistent right upper lobe consolidation.  Worsening airspace disease in the right lower lobe. Stable streaky left lower lobe atelectasis or infiltrate.  Original Report Authenticated By: Natasha Mead, M.D.   Dg Chest Port 1 View  03/29/2012  *RADIOLOGY REPORT*  Clinical Data: Status post bronchoscopy, respiratory distress  PORTABLE CHEST - 1 VIEW  Comparison: 03/28/2012  Findings: Cardiomediastinal silhouette is stable.  Stable right Port-A-Cath position.  Stable consolidation in the right upper lobe.  Mild improved aeration in the right lower lobe.  Persistent patchy airspace disease right lower lobe and left midlung.  No diagnostic pneumothorax.  No convincing pulmonary edema.  IMPRESSION: Stable right Port-A-Cath position.  Stable consolidation in the right upper lobe.  Mild improved aeration in the right lower lobe. Persistent patchy airspace disease right lower lobe and left midlung.  No diagnostic pneumothorax.  No convincing pulmonary edema.  Original Report Authenticated By: Natasha Mead, M.D.  8/16: increased basilar airspace disease. Otherwise no sig change.    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 S/p FOB 8/15 Fungal 8/15>> BAL 8/15>>> Fluid ct: wbc 2150 neut 94  P: D/c bipap Continue pulm hygiene Hold off on Abx pending culture results Will start empiric trial of prednisone 8/16  A: Former smoker with wheezing and ?of COPD. P: Add schedule bronchodilators Should get benefit from prednisone for this also  A: Thrombocytopenia. P: Will need to transfuse PLT prior to bronchoscopy D/c pepcid and change to protonix  A: Stage IV Hodgkin's disease. P: Oncology following  A: ?HCAP.  ?fungus/mold in sputum from 8/9.  P: Abx per ID  A: Acute renal insufficiency with hx of Stage III CKD.  Non-gap metabolic acidosis. 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   Lab 03/30/12 0345 03/29/12 0510 03/28/12 0410  CREATININE 2.83* 2.52* 2.32*  CO2 16* 18* 17*  K 3.8 3.6 3.6  P: Per primary team  Reviewed above, examined pt, and agree with assessment/plan.  Will give dose of lasix.  Will start empiric steroids.  F/u CXR intermittently, unless clinical status gets worse.  Will resume diet.  Updated family at bedside.  OK to transfer back to med/surg

## 2012-03-30 NOTE — Progress Notes (Addendum)
INFECTIOUS DISEASE PROGRESS NOTE  ID: Keith Bell is a 61 y.o. 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    Subjective: No acute events, bronchoscopy tomorrow.  Abtx:  Anti-infectives     Start     Dose/Rate Route Frequency Ordered Stop   03/25/12 1100   vancomycin (VANCOCIN) 750 mg in sodium chloride 0.9 % 150 mL IVPB  Status:  Discontinued        750 mg 150 mL/hr over 60 Minutes Intravenous Every 48 hours 03/23/12 1452 03/25/12 0945   03/25/12 1000   vancomycin (VANCOCIN) 750 mg in sodium chloride 0.9 % 150 mL IVPB  Status:  Discontinued        750 mg 150 mL/hr over 60 Minutes Intravenous Every 24 hours 03/25/12 0945 03/26/12 1444   03/23/12 2100   piperacillin-tazobactam (ZOSYN) IVPB 3.375 g  Status:  Discontinued        3.375 g 12.5 mL/hr over 240 Minutes Intravenous Every 8 hours 03/23/12 1452 03/28/12 1533   03/23/12 1115   vancomycin (VANCOCIN) IVPB 1000 mg/200 mL premix     Comments: BEGIN AFTER BLOOD CULTURES DRAWN      1,000 mg 200 mL/hr over 60 Minutes Intravenous  Once 03/23/12 1109 03/23/12 1238   03/23/12 1115  piperacillin-tazobactam (ZOSYN) IVPB 3.375 g    Comments: BEGIN AFTER BLOOD CULTURES DRAWN     3.375 g 12.5 mL/hr over 240 Minutes Intravenous  Once 03/23/12 1109 03/23/12 1641          Medications: I have reviewed the patient's current medications.  Objective: Vital signs in last 24 hours: Temp:  [96.9 F (36.1 C)-97.7 F (36.5 C)] 97.5 F (36.4 C) (08/16 0800) Pulse Rate:  [98-117] 98  (08/16 0755) Resp:  [20-42] 28  (08/16 0755) BP: (98-152)/(64-104) 121/76 mmHg (08/16 0755) SpO2:  [90 %-100 %] 97 % (08/16 1202) FiO2 (%):  [45 %] 45 % (08/15 1415) Weight:  [126 lb 15.8 oz (57.6 kg)-128 lb 8.5 oz (58.3 kg)] 128 lb 8.5 oz (58.3 kg) (08/16 0400)   General appearance: no distress Resp: clear to auscultation bilaterally Cardio: regular rate and rhythm, S1, S2 normal, no  murmur, click, rub or gallop  Lab Results  Basename 03/30/12 0345 03/29/12 0950 03/29/12 0510  WBC 10.8* 12.3* --  HGB 9.8* 9.8* --  HCT 28.4* 28.5* --  NA 145 -- 144  K 3.8 -- 3.6  CL 113* -- 112  CO2 16* -- 18*  BUN 36* -- 34*  CREATININE 2.83* -- 2.52*  GLU -- -- --   Liver Panel  Basename 03/30/12 0345  PROT --  ALBUMIN 1.3*  AST --  ALT --  ALKPHOS --  BILITOT --  BILIDIR --  IBILI --   Sedimentation Rate No results found for this basename: ESRSEDRATE in the last 72 hours C-Reactive Protein No results found for this basename: CRP:2 in the last 72 hours  Microbiology: Recent Results (from the past 240 hour(s))  CULTURE, BLOOD (ROUTINE X 2)     Status: Normal   Collection Time   03/23/12 11:24 AM      Component Value Range Status Comment   Specimen Description BLOOD PORTA CATH   Final    Special Requests BOTTLES DRAWN AEROBIC AND ANAEROBIC 3CC   Final    Culture  Setup Time 03/24/2012 01:00   Final    Culture NO GROWTH 5 DAYS   Final  Report Status 03/30/2012 FINAL   Final   CULTURE, BLOOD (ROUTINE X 2)     Status: Normal   Collection Time   03/23/12 11:31 AM      Component Value Range Status Comment   Specimen Description BLOOD LEFT HAND   Final    Special Requests BOTTLES DRAWN AEROBIC AND ANAEROBIC 4CC   Final    Culture  Setup Time 03/24/2012 01:00   Final    Culture NO GROWTH 5 DAYS   Final    Report Status 03/30/2012 FINAL   Final   MRSA PCR SCREENING     Status: Normal   Collection Time   03/23/12  2:37 PM      Component Value Range Status Comment   MRSA by PCR NEGATIVE  NEGATIVE Final   CULTURE, EXPECTORATED SPUTUM-ASSESSMENT     Status: Normal   Collection Time   03/23/12  3:33 PM      Component Value Range Status Comment   Specimen Description SPUTUM   Final    Special Requests NONE   Final    Sputum evaluation     Final    Value: THIS SPECIMEN IS ACCEPTABLE. RESPIRATORY CULTURE REPORT TO FOLLOW.   Report Status 03/23/2012 FINAL   Final     CULTURE, RESPIRATORY     Status: Normal   Collection Time   03/23/12  4:05 PM      Component Value Range Status Comment   Specimen Description SPUTUM   Final    Special Requests NONE   Final    Gram Stain     Final    Value: NO WBC SEEN     NO SQUAMOUS EPITHELIAL CELLS SEEN     NO ORGANISMS SEEN   Culture     Final    Value: FEW FUNGUS (MOLD) ISOLATED, PROBABLE CONTAMINANT/COLONIZER (SAPROPHYTE). CONTACT MICROBIOLOGY IF FURTHER IDENTIFICATION REQUIRED [phone].   Report Status 03/26/2012 FINAL   Final   FUNGUS CULTURE W SMEAR     Status: Normal (Preliminary result)   Collection Time   03/29/12 10:42 AM      Component Value Range Status Comment   Specimen Description BRONCHIAL ALVEOLAR LAVAGE LEFT   Final    Special Requests Immunocompromised   Final    Fungal Smear NO YEAST OR FUNGAL ELEMENTS SEEN   Final    Culture CULTURE IN PROGRESS FOR FOUR WEEKS   Final    Report Status PENDING   Incomplete   CULTURE, BAL-QUANTITATIVE     Status: Normal (Preliminary result)   Collection Time   03/29/12 10:42 AM      Component Value Range Status Comment   Specimen Description BRONCHIAL ALVEOLAR LAVAGE LEFT   Final    Special Requests NONE   Final    Gram Stain PENDING   Incomplete    Colony Count PENDING   Incomplete    Culture NO GROWTH 1 DAY   Final    Report Status PENDING   Incomplete   FUNGUS CULTURE W SMEAR     Status: Normal (Preliminary result)   Collection Time   03/29/12 11:54 AM      Component Value Range Status Comment   Specimen Description BRONCHIAL ALVEOLAR LAVAGE RIGHT   Final    Special Requests Immunocompromised   Final    Fungal Smear RARE YEAST WITH PSEUDOHYPHAE   Final    Culture CULTURE IN PROGRESS FOR FOUR WEEKS   Final    Report Status PENDING   Incomplete  CULTURE, BAL-QUANTITATIVE     Status: Normal (Preliminary result)   Collection Time   03/29/12 11:54 AM      Component Value Range Status Comment   Specimen Description BRONCHIAL ALVEOLAR LAVAGE RIGHT    Final    Special Requests NONE   Final    Gram Stain     Final    Value: FEW WBC PRESENT,BOTH PMN AND MONONUCLEAR     NO SQUAMOUS EPITHELIAL CELLS SEEN     NO ORGANISMS SEEN   Colony Count PENDING   Incomplete    Culture NO GROWTH 1 DAY   Final    Report Status PENDING   Incomplete   MRSA PCR SCREENING     Status: Normal   Collection Time   03/29/12  3:33 PM      Component Value Range Status Comment   MRSA by PCR NEGATIVE  NEGATIVE Final     Studies/Results: Dg Chest 2 View  03/30/2012  *RADIOLOGY REPORT*  Clinical Data: Follow up pulmonary infiltrates.  Hypertension. Short of breath.  CHEST - 2 VIEW  Comparison: 03/29/2012.  Findings: Worsening aeration at the lung bases.  Progressive airspace disease, particularly at the cardiac apex.  Differential considerations include worsening pneumonia, edema, or aspiration. Combination of these densities could be present as well. Consolidation of the right upper lobe respecting the fissure. Grossly, the cardiopericardial silhouette appears unchanged.  IMPRESSION:  1.  Unchanged appearance of the right upper lobe. 2.  Marked worsening of basilar airspace disease as described above.  Original Report Authenticated By: Andreas Newport, M.D.   Dg Chest Port 1 View  03/29/2012  *RADIOLOGY REPORT*  Clinical Data: Status post bronchoscopy, respiratory distress  PORTABLE CHEST - 1 VIEW  Comparison: 03/28/2012  Findings: Cardiomediastinal silhouette is stable.  Stable right Port-A-Cath position.  Stable consolidation in the right upper lobe.  Mild improved aeration in the right lower lobe.  Persistent patchy airspace disease right lower lobe and left midlung.  No diagnostic pneumothorax.  No convincing pulmonary edema.  IMPRESSION: Stable right Port-A-Cath position.  Stable consolidation in the right upper lobe.  Mild improved aeration in the right lower lobe. Persistent patchy airspace disease right lower lobe and left midlung.  No diagnostic pneumothorax.  No  convincing pulmonary edema.  Original Report Authenticated By: Natasha Mead, M.D.     Assessment/Plan: 1)  Pulmonary infiltrate - doubt infectious etiology. Now off of antibiotics, starting steroids.  Rare yeast with pseudohyphae noted, c/w Candida which not typically pathogenic, even in immunosuppressed.  Will monitor.  If no improvement with steroids, could consider micafungin.    Dr. Macario Golds over the weekend if needed.   [name], [name] Infectious Diseases 03/30/2012, 1:20 PM

## 2012-03-30 NOTE — Progress Notes (Signed)
Subjective: Better, off of Bipap, resting without complaints.   Objective Vital signs in last 24 hours: Filed Vitals:   03/30/12 0000 03/30/12 0200 03/30/12 0400 03/30/12 0600  BP: 98/68 101/64 108/75   Pulse: 105 108 103 101  Temp: 97 F (36.1 C)  97.6 F (36.4 C)   TempSrc: Oral     Resp: 29 26 25 26   Height:      Weight:   58.3 kg (128 lb 8.5 oz)   SpO2: 97% 96% 95% 95%   Weight change: -4.1 kg (-9 lb 0.6 oz)  Intake/Output Summary (Last 24 hours) at 03/30/12 0630 Last data filed at 03/30/12 0600  Gross per 24 hour  Intake 1167.17 ml  Output    575 ml  Net 592.17 ml   Labs: Basic Metabolic Panel:  Lab 03/30/12 1914 03/29/12 0510 03/28/12 0410 03/27/12 0450 03/26/12 0515 03/25/12 2056 03/25/12 0828  NA 145 144 144 139 144 145 148*  K 3.8 3.6 3.6 3.8 3.4* 3.4* 2.8*  CL 113* 112 113* 108 112 111 114*  CO2 16* 18* 17* 17* 19 19 21   GLUCOSE 50* 121* 77 122* 154* 131* 179*  BUN 36* 34* 29* 27* 27* 28* 31*  CREATININE 2.83* 2.52* 2.32* 2.25* 2.21* 2.10* 2.19*  ALB -- -- -- -- -- -- --  CALCIUM 8.0* 8.2* 8.2* 8.1* 7.8* 7.7* 7.5*  PHOS 4.9* -- -- -- -- -- --   Liver Function Tests:  Lab 03/30/12 0345  AST --  ALT --  ALKPHOS --  BILITOT --  PROT --  ALBUMIN 1.3*   No results found for this basename: LIPASE:3,AMYLASE:3 in the last 168 hours No results found for this basename: AMMONIA:3 in the last 168 hours CBC:  Lab 03/30/12 0345 03/29/12 0950 03/29/12 0510 03/28/12 0410 03/26/12 0515  WBC 10.8* 12.3* 12.0* 11.1* --  NEUTROABS 9.6* -- 10.4* 10.0* 6.6  HGB 9.8* 9.8* 10.1* 10.2* --  HCT 28.4* 28.5* 28.6* 29.0* --  MCV 82.3 81.2 80.6 81.2 --  PLT 20* 36* 20* 22* --   PT/INR: @labrcntip (inr:5) Cardiac Enzymes:  Lab 03/25/12 2055 03/25/12 0225 03/24/12 1840 03/23/12 0955  CKTOTAL 24 20 22  --  CKMB 1.7 1.5 1.7 --  CKMBINDEX -- -- -- --  TROPONINI <0.30 <0.30 <0.30 <0.30   CBG: No results found for this basename: GLUCAP:5 in the last 168 hours  Iron  Studies:   Lab 03/25/12 1054  IRON 10*  TIBC 78*  TRANSFERRIN --  FERRITIN 3967*    Physical Exam:  Blood pressure 108/75, pulse 101, temperature 97.6 F (36.4 C), temperature source Oral, resp. rate 26, height 5\' 6"  (1.676 m), weight 58.3 kg (128 lb 8.5 oz), SpO2 95.00%.  Gen: thin AAM, in ICU, on bipap after bronch this am Skin: no rash, cyanosis  HEENT: EOMI, sclera anicteric, throat slightly dry  Neck: no JVD, no bruits or LAN  Chest: R sided rhonchi/coarse rales, L mostly clear  Heart: regular, no rub or gallop, no sig murmur  Abdomen: soft, scaphoid, nontender, +BS, no HSM  Ext: +pitting pedal edema 1-2+ bilat and mild nonpitting of legs and arms bilat  Neuro: somnolent,  no focal deficit   Date   Creat  2012   0.6-0.23 Sep 2011  1.22 Oct 2011 0.97  May   1.02  June   1.5-1.9  July   1.3-1.9  8/9    2.81  8/14   2.32  Today  2.5  UA 8/9 >  100 prot, 0-2 rbc, no wbc, few bact  Urine Na > 46  UCreat > 12.9    Urine prot:creat ratio = 2.2 gm/24hr  Impression/Plan  1. Renal failure, subacute- this is not an acute process and has been progressive over the last 6 months at least. Hodgkin's lymphoma has been associated with a number of glomerular disorders including minimal change disease, membranous GN, FSGS, MPGN and amyloidosis. 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 I don't think renal failure is hemodynamic or due to chemo or volume depletion. At this point he has received plenty of fluids with no improvement and has developed edema of the extremities.  Will stop fluids.  He is not a candidate for renal biopsy or immunosuppression, recommend supportive care as tolerated. No other suggestions at this time unfortunately.  Will sign off, please call as needed.  2. Pulmonary consolidation- discussed with Dr. Arline Asp, He is going to hold the Afinitor.  3. COPD 4. Hodgkin's  lymphoma, advanced.  5. Limited code-  No CPR, no intubation and no defib. Drugs only.    Keith Moselle  MD [facility] [phone] pgr    [phone] cell 03/30/2012, 6:30 AM

## 2012-03-31 LAB — COMPREHENSIVE METABOLIC PANEL
ALT: 5 U/L (ref 0–53)
Alkaline Phosphatase: 330 U/L — ABNORMAL HIGH (ref 39–117)
CO2: 17 mEq/L — ABNORMAL LOW (ref 19–32)
GFR calc Af Amer: 24 mL/min — ABNORMAL LOW (ref >90)
GFR calc non Af Amer: 21 mL/min — ABNORMAL LOW (ref >90)
Glucose, Bld: 188 mg/dL — ABNORMAL HIGH (ref 70–99)
Potassium: 4.2 mEq/L (ref 3.5–5.1)
Sodium: 145 mEq/L (ref 135–145)
Total Protein: 4.9 g/dL — ABNORMAL LOW (ref 6.0–8.3)

## 2012-03-31 LAB — CULTURE, BAL-QUANTITATIVE W GRAM STAIN

## 2012-03-31 LAB — CBC
HCT: 32.7 % — ABNORMAL LOW (ref 39.0–52.0)
HCT: 32.4 % — ABNORMAL LOW (ref 39.0–52.0)
MCV: 82 fL (ref 78.0–100.0)
Platelets: 80 10*3/uL — ABNORMAL LOW (ref 150–400)
RBC: 3.99 MIL/uL — ABNORMAL LOW (ref 4.22–5.81)
RDW: 19.5 % — ABNORMAL HIGH (ref 11.5–15.5)
WBC: 18.1 10*3/uL — ABNORMAL HIGH (ref 4.0–10.5)
WBC: 19.8 10*3/uL — ABNORMAL HIGH (ref 4.0–10.5)

## 2012-03-31 LAB — DIC (DISSEMINATED INTRAVASCULAR COAGULATION)PANEL: Prothrombin Time: 14.9 seconds (ref 11.6–15.2)

## 2012-03-31 MED ORDER — DIPHENHYDRAMINE HCL 25 MG PO CAPS
25.0000 mg | ORAL_CAPSULE | Freq: Once | ORAL | Status: AC
  Administered 2012-03-31: 25 mg via ORAL
  Filled 2012-03-31: qty 1

## 2012-03-31 MED ORDER — PHYTONADIONE 5 MG PO TABS
5.0000 mg | ORAL_TABLET | Freq: Every day | ORAL | Status: AC
  Administered 2012-03-31 – 2012-04-02 (×3): 5 mg via ORAL
  Filled 2012-03-31 (×3): qty 1

## 2012-03-31 MED ORDER — ACETAMINOPHEN 325 MG PO TABS
650.0000 mg | ORAL_TABLET | Freq: Once | ORAL | Status: AC
  Administered 2012-03-31: 650 mg via ORAL
  Filled 2012-03-31: qty 2

## 2012-03-31 NOTE — Progress Notes (Signed)
[facility] PROGRESS NOTE  Keith Bell UJW:[id number] DOB: [birth year] DOA: 03/23/2012 PCP: Eino Farber, MD  Assessment/Plan: Principal Problem:  *Sepsis Active Problems:  Hodgkin's lymphoma in relapse  COPD (chronic obstructive pulmonary disease)  Hyperlipidemia  Hypotension  Acute on chronic renal failure  Iron deficiency anemia  CKD (chronic kidney disease), stage III  Metabolic acidosis  Necrotizing pneumonia  SIRS (systemic inflammatory response syndrome)  HCAP (healthcare-associated pneumonia)  Thrombocytopenia  Hypomagnesemia  Hypokalemia  Hypernatremia  #1 hypotension Resolved. Likely secondary to sepsis secondary to recurrent probable necrotizing pneumonia/healthcare associated pneumonia. Systolic blood pressure improved.  Pro calcitonin was elevated at 13 on admission decreasing.  Sputum Gram stain and cultures with fungus ?? Contaminant.. Blood cultures are pending. Urine Legionella is negative. Urine strep pneumococcus is negative.  Vancomycin d/c'd per ID. Zosyn d/c'd per ID. Patient s/p broncoscopy with cultures pending. PCCM following and appreciate input and recommendations. Continue supportive care.  #2 recurrent necrotizing pneumonia/healthcare associated pneumonia/?inflammatory vs fungal vs noninfectiuos vs BOOP This is patient's third hospitalization for right upper lobe pneumonia first episode in June of 2013 second episode in July of 2013 with patient was diagnosed with a necrotizing pneumonia per pulmonary medicine. Patient was discharged home on Levaquin and augmentin for 3 weeks, however concern as to whether patient took full course of antibiotics. Patient is presenting with productive cough, chills, fever, chest x-ray consistent with worsening pneumonia. Sputum Gram stain and cultures are pending. Urine strep pneumococcus is negative. A urine Legionella is negative. Pro calcitonin levels were elevated at 13 on admission and now at 5.61 and  trending down. Lactic acid level was 2.1 on admission and trending down. Will repeat pro calcitonin in the a.m. Sputum with some fungus however likely contaminant.Repeat CT of the chest with continued progression of severe consolidated pneumonia in the right upper lobe superimposed on severe centrilobular emphysema and also do a space disease in the lingula with a peripheral nodular component suggestive of a retinal same process in the right upper lobe, mild airspace disease in the right lower lobe. Pulmonary critical care is following and patient s/p bronchoscopy.  IV Zosyn d/c'd. Vancomycin d/c'd per ID. Nebs as needed. Mucinex. Tessalon Perles.  CXR worsening. Pulmonary and critical care following. Patient is status post bronchoscopy which had the turbid-appearing fluid with elevated WBCs and elevated neutrophil count. Some growth of fungus in BAL. Cultures are pending. Antibiotics discontinued for now. We'll await cultures. Patient started on steroids. Pulmonary following and appreciate input and recommendations. ID ff and appreciate input and rxcs.   #3 sepsis Likely secondary to problem #2. Urinalysis is negative. Chest x-ray consistent with recurrent worsening pneumonia. CT of the chest with multilobar and pneumonia and progression of worsening recurrent right upper lobe pneumonia .Blood cultures are pending. Pro calcitonin level was elevated on admission at 13 and trending down. Lactic acid level was 2.1 and trending down. Urine strep pneumococcus is negative. Urine Legionella is negative. Sputum with some fungus however likely contaminant.  IV Zosyn d/c'd per ID. Vancomycin d/c'd per ID. patient is status post bronchoscopy with cultures pending however fluid noted to be brown/purulent with increased white blood cells turbid in nature with an elevated neutrophil count. Growth of some fungus in BAL. All antibiotics have been discontinued at this time per ID. patient be started on oral prednisone per  pulmonary. Awaiting culture results prior to resuming antibiotics if needed per pulmonary. Pulmonary and critical care medicine ff. ID ff pt s/p bronchoscopy.  #4 acute on  chronic kidney disease stage III Patient's baseline creatinine is approximately 1.4-1.7. Creatinine on admission was 2.8. Likely secondary to a prerenal azotemia initially. 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 Patient is currently euvolemic. Renal function worsening with unknown etiology.?? Sec to chemo.. Creatinine worsening currently at 3.03 from 2.83 from 2.52 from 2.32 from 2.25 from 2.21 and trending back up. Patient was given a dose of Lasix orally yesterday. Renal ultrasound normal. Continue supportive care follow. Continue bicarbonate tablets. Renal ff.. Follow.  #5 thrombocytopenia/Anemia Likely secondary to problem #3 and afinitor versus consumptive state.. Patient is status post 3 unit of packed red blood cells. Afinitor has been discontinued. Hgb is now 11.3 from 10.5 from 9.7 from 7.6. LDH WNL.  Haptoglobin elevated at 409. Peripheral smear negative for schistocytes, however with elevated d dimer and fibrinogen. ???DIC. Platelets trending down and at 16 today, no overt bleeding. Patient is status post 1 bag of pheresed platelets and vit K as PTT AND PT elevated. Discussed with Dr Arline Asp of heme/onc. We'll transfuse one bag of platelets today. Place on oral vitamin K for 3 more days. Patient has also been started on steroids per pulmonary. Monitor for now. Hematology followed and appreciate input and recommendations.   #6 COPD Stable. Nebs when necessary. Patient started on steroids secondary to problem #1.  #7 metabolic acidosis Likely secondary to sepsis versus acute on chronic kidney disease stage III. Improved with HCO3 in IVF. D/C'd HCO3 4 days ago and started oral bicarbonate. Oral bicarbonate dose increased. Follow. See problem  #3.  #8 hypomagnesemia/hypokalemia Repleted.  #9 hypernatremia Improved with change of IV fluids. Continue NS and decrease rate.  #10 constipation Resolved with MiraLAX. Colace 100 mg twice a day.   #11 Hodgkin's lymphoma, relapse Afinitor has been discontinued secondary to his anemia, thrombocytopenia, sepsis. Hematology oncology is following.  #12 prophylaxis Pepcid for GI prophylaxis. SCDs for DVT prophylaxis.  #13: Code Status Discussed CODE STATUS with patient after reviewing Dr. Mamie Levers note from 03/17/2011. Patient is in agreement with a Limited code. No CPR. No mechanical ventilation. May use pressors or antiarrhythmics.   Code Status: Limited code. Family Communication: Updated patient and sister via telephone. Updated patient at bedside.  Disposition Plan: Home when medically stable.   Brief narrative: Very pleasant 61 yo with hx copd, hodgkins lymphoma, necrotizing pna presents to ED cc sob and cough. Information provided by painted. States symptoms began 3 days ago have persisted and worsened. Associated symptoms include chills, subjective fever, pleuritic chest pain and sinus congestion as well as some nausea and 3 episodes of vomiting. Reports emesis clear without dk coffee ground substances. Denies dysuria/hematuria abdominal pain or diarrhea. In ED pt was tachycardic, hypotensive and chest xray concerning for pna. Of note pt was discharged from hospital 7/26 after 5 day stay for similar symptoms. Was discharge on levaquin to be taken for 3 week. [facility] asked to admit      Consultants:  Pulmonary: Dr Wert/Dr. Craige Cotta 03/24/12   Hematolgy/Oncology: Dr Darrold Span 03/24/12  Renal : Dr. Arlean Hopping 03/28/12  Procedures:  CXR  03/23/12  Status post 1 unit of packed red blood cells on 03/23/2012  V/Q scan 03/24/12  CT Chest 03/24/12  Renal US 03/24/12  2 units PRBC  03/25/12  Platelets 1 bag 03/29/12  Bronchial alveolar lavage 03/29/2012  Platelets 1 bag pending 8  /17/ 2013  Antibiotics:  IV Vancomycin 03/23/12---> 03/26/12  IV Zosyn 03/23/12--->03/28/12  HPI/Subjective: Patient  sitting in a chair. Patient denies any bleeding. Family at bedside.  Objective: Filed Vitals:   03/30/12 2132 03/31/12 0533 03/31/12 0744 03/31/12 1045  BP: 130/90 136/99  125/90  Pulse: 100 108  112  Temp: 97.6 F (36.4 C) 97.3 F (36.3 C)  98.5 F (36.9 C)  TempSrc: Oral Oral  Oral  Resp: 16 24  22   Height:      Weight:  57.6 kg (126 lb 15.8 oz)    SpO2: 97% 94% 95%     Intake/Output Summary (Last 24 hours) at 03/31/12 1124 Last data filed at 03/31/12 1100  Gross per 24 hour  Intake    715 ml  Output    350 ml  Net    365 ml    Exam: General: Cachectic. Sitting in chair. Coughing. HEENT: No bruits, no goiter. Heart: Tachycardia, without murmurs, rubs, gallops. Lungs: Coarse BS anterior lung fields. No wheezes. Abdomen: Soft, nontender, nondistended, positive bowel sounds. Extremities: No clubbing cyanosis or edema with positive pedal pulses. Neuro: Grossly intact, nonfocal.  Data Reviewed: Basic Metabolic Panel:  Lab 03/31/12 4540 03/30/12 0345 03/29/12 0510 03/28/12 0410 03/27/12 0450 03/26/12 0515 03/25/12 0828  NA 145 145 144 144 139 -- --  K 4.2 3.8 3.6 3.6 3.8 -- --  CL 113* 113* 112 113* 108 -- --  CO2 17* 16* 18* 17* 17* -- --  GLUCOSE 188* 50* 121* 77 122* -- --  BUN 46* 36* 34* 29* 27* -- --  CREATININE 3.03* 2.83* 2.52* 2.32* 2.25* -- --  CALCIUM 8.5 8.0* 8.2* 8.2* 8.1* -- --  MG -- -- -- 2.2 1.6 1.9 1.2*  PHOS 5.1* 4.9* -- -- -- -- --   Liver Function Tests:  Lab 03/31/12 0523 03/30/12 0345  AST 8 --  ALT 5 --  ALKPHOS 330* --  BILITOT 0.2* --  PROT 4.9* --  ALBUMIN 1.3* 1.3*   No results found for this basename: LIPASE:5,AMYLASE:5 in the last 168 hours No results found for this basename: AMMONIA:5 in the last 168 hours CBC:  Lab 03/31/12 0524 03/31/12 0523 03/30/12 0345 03/29/12 0950 03/29/12 0510 03/28/12 0410 03/26/12  0515 03/25/12 0828  WBC -- 18.1* 10.8* 12.3* 12.0* 11.1* -- --  NEUTROABS -- -- 9.6* -- 10.4* 10.0* 6.6 7.9*  HGB -- 11.3* 9.8* 9.8* 10.1* 10.2* -- --  HCT -- 32.7* 28.4* 28.5* 28.6* 29.0* -- --  MCV -- 82.0 82.3 81.2 80.6 81.2 -- --  PLT 16* 16* 20* 36* 20* -- -- --   Cardiac Enzymes:  Lab 03/25/12 2055 03/25/12 0225 03/24/12 1840  CKTOTAL 24 20 22   CKMB 1.7 1.5 1.7  CKMBINDEX -- -- --  TROPONINI <0.30 <0.30 <0.30   BNP (last 3 results)  Basename 03/24/12 1149 03/05/12 2100 02/22/12 1600  PROBNP 1538.0* 479.3* 1118.0*   CBG: No results found for this basename: GLUCAP:5 in the last 168 hours  Recent Results (from the past 240 hour(s))  CULTURE, BLOOD (ROUTINE X 2)     Status: Normal   Collection Time   03/23/12 11:24 AM      Component Value Range Status Comment   Specimen Description BLOOD PORTA CATH   Final    Special Requests BOTTLES DRAWN AEROBIC AND ANAEROBIC 3CC   Final    Culture  Setup Time 03/24/2012 01:00   Final    Culture NO GROWTH 5 DAYS   Final    Report Status 03/30/2012 FINAL   Final   CULTURE, BLOOD (ROUTINE  X 2)     Status: Normal   Collection Time   03/23/12 11:31 AM      Component Value Range Status Comment   Specimen Description BLOOD LEFT HAND   Final    Special Requests BOTTLES DRAWN AEROBIC AND ANAEROBIC 4CC   Final    Culture  Setup Time 03/24/2012 01:00   Final    Culture NO GROWTH 5 DAYS   Final    Report Status 03/30/2012 FINAL   Final   MRSA PCR SCREENING     Status: Normal   Collection Time   03/23/12  2:37 PM      Component Value Range Status Comment   MRSA by PCR NEGATIVE  NEGATIVE Final   CULTURE, EXPECTORATED SPUTUM-ASSESSMENT     Status: Normal   Collection Time   03/23/12  3:33 PM      Component Value Range Status Comment   Specimen Description SPUTUM   Final    Special Requests NONE   Final    Sputum evaluation     Final    Value: THIS SPECIMEN IS ACCEPTABLE. RESPIRATORY CULTURE REPORT TO FOLLOW.   Report Status 03/23/2012 FINAL    Final   CULTURE, RESPIRATORY     Status: Normal   Collection Time   03/23/12  4:05 PM      Component Value Range Status Comment   Specimen Description SPUTUM   Final    Special Requests NONE   Final    Gram Stain     Final    Value: NO WBC SEEN     NO SQUAMOUS EPITHELIAL CELLS SEEN     NO ORGANISMS SEEN   Culture     Final    Value: FEW FUNGUS (MOLD) ISOLATED, PROBABLE CONTAMINANT/COLONIZER (SAPROPHYTE). CONTACT MICROBIOLOGY IF FURTHER IDENTIFICATION REQUIRED [phone].   Report Status 03/26/2012 FINAL   Final   AFB CULTURE WITH SMEAR     Status: Normal (Preliminary result)   Collection Time   03/29/12 10:42 AM      Component Value Range Status Comment   Specimen Description BRONCHIAL ALVEOLAR LAVAGE LEFT   Final    Special Requests Immunocompromised   Final    ACID FAST SMEAR NO ACID FAST BACILLI SEEN   Final    Culture     Final    Value: CULTURE WILL BE EXAMINED FOR 6 WEEKS BEFORE ISSUING A FINAL REPORT   Report Status PENDING   Incomplete   FUNGUS CULTURE W SMEAR     Status: Normal (Preliminary result)   Collection Time   03/29/12 10:42 AM      Component Value Range Status Comment   Specimen Description BRONCHIAL ALVEOLAR LAVAGE LEFT   Final    Special Requests Immunocompromised   Final    Fungal Smear NO YEAST OR FUNGAL ELEMENTS SEEN   Final    Culture CULTURE IN PROGRESS FOR FOUR WEEKS   Final    Report Status PENDING   Incomplete   CULTURE, BAL-QUANTITATIVE     Status: Normal (Preliminary result)   Collection Time   03/29/12 10:42 AM      Component Value Range Status Comment   Specimen Description BRONCHIAL ALVEOLAR LAVAGE LEFT   Final    Special Requests NONE   Final    Gram Stain PENDING   Incomplete    Colony Count PENDING   Incomplete    Culture NO GROWTH 1 DAY   Final    Report Status PENDING   Incomplete  FUNGUS CULTURE W SMEAR     Status: Normal (Preliminary result)   Collection Time   03/29/12 11:54 AM      Component Value Range Status Comment   Specimen  Description BRONCHIAL ALVEOLAR LAVAGE RIGHT   Final    Special Requests Immunocompromised   Final    Fungal Smear RARE YEAST WITH PSEUDOHYPHAE   Final    Culture CULTURE IN PROGRESS FOR FOUR WEEKS   Final    Report Status PENDING   Incomplete   CULTURE, BAL-QUANTITATIVE     Status: Normal (Preliminary result)   Collection Time   03/29/12 11:54 AM      Component Value Range Status Comment   Specimen Description BRONCHIAL ALVEOLAR LAVAGE RIGHT   Final    Special Requests NONE   Final    Gram Stain     Final    Value: FEW WBC PRESENT,BOTH PMN AND MONONUCLEAR     NO SQUAMOUS EPITHELIAL CELLS SEEN     NO ORGANISMS SEEN   Colony Count PENDING   Incomplete    Culture NO GROWTH 1 DAY   Final    Report Status PENDING   Incomplete   MRSA PCR SCREENING     Status: Normal   Collection Time   03/29/12  3:33 PM      Component Value Range Status Comment   MRSA by PCR NEGATIVE  NEGATIVE Final      Studies: Dg Chest 2 View  03/23/2012  *RADIOLOGY REPORT*  Clinical Data: Cough.  Pleurisy.  Short of breath.  History of cancer.  CHEST - 2 VIEW  Comparison: 03/05/2012.  Findings: Progressive airspace disease is present in the right upper lobe, best seen on the lateral view.  When comparing lateral views to prior chest radiograph 03/05/2012, there is continued/progressive consolidation of the right upper lobe.  Support apparatus appears unchanged.  There is some faint airspace disease in the left midlung as well.  Cardiopericardial silhouette unchanged.  Right IJ power port appears similar.  IMPRESSION: Progressive consolidation of the right upper lobe, best seen on the lateral view.  Faint airspace opacity in the left midlung, which may also represent pneumonia.  Original Report Authenticated By: Andreas Newport, M.D.   Dg Chest 2 View  03/05/2012  *RADIOLOGY REPORT*  Clinical Data: Respiratory distress.  CHEST - 2 VIEW  Comparison: 02/27/2012.  Findings: The power port is stable.  The right upper lobe airspace  process shows a slight decrease and consolidation.  The remainder the lungs are clear.  IMPRESSION: Persistent but slight improved right upper lobe pneumonia.  Original Report Authenticated By: P. Loralie Champagne, M.D.   Dg Chest 2 View  02/27/2012  *RADIOLOGY REPORT*  Clinical Data: Cough, congestion.  CHEST - 2 VIEW  Comparison: 02/22/2012  Findings: Right Port-A-Cath remains in place, unchanged.  Dense consolidation in the right upper lobe is stable.  Left lung is clear.  Heart is normal size.  No effusions.  IMPRESSION: Stable dense right upper lobe consolidation.  Original Report Authenticated By: Cyndie Chime, M.D.   Ct Chest Wo Contrast  02/23/2012  *RADIOLOGY REPORT*  Clinical Data: Right lower lobe infiltrate.  Non Hodgkin lymphoma. Shortness of breath.  CT CHEST WITHOUT CONTRAST  Technique:  Multidetector CT imaging of the chest was performed following the standard protocol without IV contrast.  Comparison: PET 12/21/2011. CT chest 12/18/2008.  Findings: No pathologically enlarged mediastinal or axillary lymph nodes.  The hilar regions are difficult to definitively evaluate without IV contrast.  Coronary artery calcification.  Heart size normal.  No pericardial effusion.  Emphysema.  Airspace consolidation with internal rounded lucencies involving the majority of the anterior and posterior segment of the right upper lobe.  Associated air bronchograms.  Additional consolidation is seen in the medial aspect of the right lower lobe, with at least one air-fluid level (image 30), possibly with a previous site of bullous disease, as seen on 12/18/2008. Additional scattered peribronchovascular nodularity and mild consolidation in the right lower lobe, inferiorly.  7 mm subpleural left upper lobe nodule (image 36) is unchanged from 12/18/2008, indicating a benign etiology.  No pleural fluid.  Airway is unremarkable.  Incidental imaging of the upper abdomen shows no acute findings. No worrisome lytic or  sclerotic lesions.  IMPRESSION:  Right upper and right lower lobe pneumonia. Associated fluid in multiple bullous lesions in the right upper and right lower lobes.  Original Report Authenticated By: Reyes Ivan, M.D.   Dg Esophagus  03/08/2012  *RADIOLOGY REPORT*  Clinical Data: Dysphagia.  ESOPHOGRAM / BARIUM SWALLOW  Technique:  Combined double contrast and single contrast examination performed using effervescent crystals, thick barium liquid, and thin barium liquid.  Fluoroscopy time:  1.2 minutes.  Comparison: 02/23/2012  Findings:  The cervical and thoracic esophagus are patent.  No high- grade stricture or mass.  Normal motility of the esophagus.  The patient ingested a 13 mm barium tablet which easily passed through the esophagus and into the stomach.  No reflux identified.  IMPRESSION:  1.  Normal esophagram.  Original Report Authenticated By: Rosealee Albee, M.D.   Dg Abd 2 Views  02/26/2012  *RADIOLOGY REPORT*  Clinical Data: Nausea and vomiting.  Hodgkin's lymphoma.  COPD.  ABDOMEN - 2 VIEW  Comparison: PET of 12/21/2011.  Findings: Upright view abdomen and a supine view of the abdomen and pelvis.  Upright view abdomen demonstrates no free intraperitoneal air or significant air fluid levels.  Interstitial thickening at the lung bases without lobar consolidation.  Supine view of the abdomen pelvis demonstrates a large colonic stool burden.  Distal gas.  No bowel distention.  Minimal convex right lumbar spine curvature.  IMPRESSION: Colonic stool burden suggests constipation.  No acute findings.  Original Report Authenticated By: Consuello Bossier, M.D.    Scheduled Meds:    . sodium chloride   Intravenous Once  . acetaminophen  650 mg Oral Once  . albuterol  2.5 mg Nebulization BID  . antiseptic oral rinse  15 mL Mouth Rinse q12n4p  . benzonatate  200 mg Oral TID  . chlorhexidine  15 mL Mouth Rinse BID  . diphenhydrAMINE  25 mg Oral Once  . docusate sodium  100 mg Oral BID  .  feeding supplement  237 mL Oral BID BM  . furosemide  40 mg Oral Once  . guaiFENesin-dextromethorphan  15 mL Oral Q6H  . ipratropium  0.5 mg Nebulization BID  . megestrol  400 mg Oral BID  . multivitamin with minerals  1 tablet Oral Daily  . pantoprazole sodium  40 mg Oral Q1200  . phytonadione  5 mg Oral Daily  . potassium chloride SA  20 mEq Oral Daily  . predniSONE  60 mg Oral Q breakfast  . sodium bicarbonate  1,300 mg Oral TID  . sodium chloride  3 mL Intravenous Q12H  . DISCONTD: butamben-tetracaine-benzocaine  1 spray Topical Once  . DISCONTD: dextromethorphan-guaiFENesin  2 tablet Oral BID  . DISCONTD: lidocaine   Topical Once  Continuous Infusions:    . sodium chloride 10 mL/hr at 03/30/12 2000    Principal Problem:  *Sepsis Active Problems:  Hodgkin's lymphoma in relapse  COPD (chronic obstructive pulmonary disease)  Hyperlipidemia  Hypotension  Acute on chronic renal failure  Iron deficiency anemia  CKD (chronic kidney disease), stage III  Metabolic acidosis  Necrotizing pneumonia  SIRS (systemic inflammatory response syndrome)  HCAP (healthcare-associated pneumonia)  Thrombocytopenia  Hypomagnesemia  Hypokalemia  Hypernatremia    Time spent: 45 mins    [facility]  [facility] Pager [phone]. If 8PM-8AM, please contact night-coverage at [email or web address], password [facility] 03/31/2012, 11:24 AM  LOS: 8 days

## 2012-04-01 DIAGNOSIS — J9600 Acute respiratory failure, unspecified whether with hypoxia or hypercapnia: Secondary | ICD-10-CM

## 2012-04-01 DIAGNOSIS — D689 Coagulation defect, unspecified: Secondary | ICD-10-CM

## 2012-04-01 LAB — PREPARE PLATELET PHERESIS

## 2012-04-01 LAB — CBC WITH DIFFERENTIAL/PLATELET
Basophils Absolute: 0 10*3/uL (ref 0.0–0.1)
Eosinophils Relative: 0 % (ref 0–5)
Lymphocytes Relative: 4 % — ABNORMAL LOW (ref 12–46)
Monocytes Relative: 5 % (ref 3–12)
Neutrophils Relative %: 91 % — ABNORMAL HIGH (ref 43–77)
Platelets: 49 10*3/uL — ABNORMAL LOW (ref 150–400)
RBC: 3.27 MIL/uL — ABNORMAL LOW (ref 4.22–5.81)
WBC: 15.8 10*3/uL — ABNORMAL HIGH (ref 4.0–10.5)

## 2012-04-01 LAB — CULTURE, BAL-QUANTITATIVE W GRAM STAIN

## 2012-04-01 LAB — RENAL FUNCTION PANEL
Albumin: 1.5 g/dL — ABNORMAL LOW (ref 3.5–5.2)
BUN: 56 mg/dL — ABNORMAL HIGH (ref 6–23)
CO2: 18 meq/L — ABNORMAL LOW (ref 19–32)
Calcium: 8.2 mg/dL — ABNORMAL LOW (ref 8.4–10.5)
Chloride: 113 meq/L — ABNORMAL HIGH (ref 96–112)
Creatinine, Ser: 3.36 mg/dL — ABNORMAL HIGH (ref 0.50–1.35)
GFR calc Af Amer: 21 mL/min — ABNORMAL LOW
GFR calc non Af Amer: 18 mL/min — ABNORMAL LOW
Glucose, Bld: 131 mg/dL — ABNORMAL HIGH (ref 70–99)
Phosphorus: 4.8 mg/dL — ABNORMAL HIGH (ref 2.3–4.6)
Potassium: 4 meq/L (ref 3.5–5.1)
Sodium: 146 meq/L — ABNORMAL HIGH (ref 135–145)

## 2012-04-01 LAB — PROTIME-INR
INR: 1.15 (ref 0.00–1.49)
Prothrombin Time: 14.9 s (ref 11.6–15.2)

## 2012-04-01 LAB — APTT: aPTT: 59 seconds — ABNORMAL HIGH (ref 24–37)

## 2012-04-01 MED ORDER — DEXTROSE 5 % IV SOLN
INTRAVENOUS | Status: DC
  Administered 2012-04-01 – 2012-04-03 (×2): 20 mL via INTRAVENOUS

## 2012-04-01 NOTE — Progress Notes (Signed)
IP PROGRESS NOTE  Subjective:   I was asked to see the patient by Dr. Janee Morn. He has a complex medical history after being diagnosed with Hodgkin's lymphoma. His treatment history as outlined by Dr. Jacky Kindle.  He was admitted with shortness of breath and a cough. He complains of a cough today. No bleeding.  Objective: Vital signs in last 24 hours: Blood pressure 138/85, pulse 72, temperature 97.4 F (36.3 C), temperature source Oral, resp. rate 20, height 5\' 6"  (1.676 m), weight 129 lb 10.1 oz (58.8 kg), SpO2 98.00%.  Intake/Output from previous day: 08/17 0701 - 08/18 0700 In: 340 [P.O.:120; I.V.:20; Blood:200] Out: 250 [Urine:250]  Physical Exam:  HEENT: No thrush or ulcers. No bleeding. Lungs: Scattered inspiratory rhonchi with bronchial sounds over the right chest, no respiratory distress Cardiac: Regular rate and rhythm Abdomen: Nontender, no hepatosplenomegaly Extremities: Trace foot/ankle edema on the left greater than right Lymph nodes: No cervical, supraclavicular, axillary, or inguinal nodes  Portacath/PICC-without erythema  Lab Results:  Basename 04/01/12 0404 03/31/12 1438  WBC 15.8* 19.8*  HGB 9.3* 11.2*  HCT 26.5* 32.4*  PLT 49* 80*    BMET  Basename 04/01/12 0408 03/31/12 0523  NA 146* 145  K 4.0 4.2  CL 113* 113*  CO2 18* 17*  GLUCOSE 131* 188*  BUN 56* 46*  CREATININE 3.36* 3.03*  CALCIUM 8.2* 8.5    Studies/Results: No results found.  Medications: I have reviewed the patient's current medications.  Assessment/Plan:  1. Hodgkin's lymphoma-multiple relapses, most recently treated with Afinitor, currently on hold due torespiratory failure and cytopenias.  2. Thrombocytopenia-etiology unclear, status post platelet transfusions during this hospital admission with improvement  3. Anemia-status post a red cell transfusion on 03/25/2012  4. Respiratory failure-progressive right lung consolidation and new swelling airspace disease on a CT  03/24/2012. Cultures from a bronchoscopy are negative to date aside from a candida species  5. Renal failure  6. Coagulopathy-the elevated PT has improved following vitamin K  7. COPD   He has a complicated history of Hodgkin's lymphoma. The chief issue at present is respiratory failure secondary to progressive lung infiltrate/consolidation. The etiology of the lung process is unclear, but this most likely represents an infection. The differential diagnosis includes drug-related pneumonitis and progressive Hodgkin's disease. The graft the anemia/thrombocytopenia may be related to the current infection, polypharmacy, or Hodgkin's lymphoma involving the bone marrow. I have a low clinical suspicion for a primary hematologic process such as hemolytic uremic syndrome. He has developed progressive renal failure over the past 2 months. This is also of unclear etiology.  The elevated PT responded to vitamin K and the PTT remains elevated. This may be related to a "lupus anticoagulant ". I doubt he has significant DIC given the improved prothrombin time and elevated fibrinogen level.  Medications:  1. Continued steroids as recommended by pulmonary medicine  2. Followup on the final culture reports from the bronchoscopy  3. Consider an open lung biopsy if the respiratory failure does not respond to steroids  4. Trial of erythropoietin if the hemoglobin falls  5. Transfuse platelets for a count of less than 10,000 or bleeding  6. Daily CBC  7. Lupus anticoagulant panel and PTT mixing study   Please call as needed. Dr. Arline Asp will return on 04/04/2012.    LOS: 9 days   Bill Yohn  04/01/2012, 11:52 AM

## 2012-04-01 NOTE — ED Provider Notes (Signed)
Medical screening examination/treatment/procedure(s) were performed by non-physician practitioner and as supervising physician I was immediately available for consultation/collaboration.  Flint Melter, MD 04/01/12 1332

## 2012-04-01 NOTE — Progress Notes (Signed)
[facility] PROGRESS NOTE  RUBIN DAIS AVW:[id number] DOB: Jun 24, 1951 DOA: 03/23/2012 PCP: Eino Farber, MD  Assessment/Plan: Principal Problem:  *Sepsis Active Problems:  Hodgkin's lymphoma in relapse  COPD (chronic obstructive pulmonary disease)  Hyperlipidemia  Hypotension  Acute on chronic renal failure  Iron deficiency anemia  CKD (chronic kidney disease), stage III  Metabolic acidosis  Necrotizing pneumonia  SIRS (systemic inflammatory response syndrome)  HCAP (healthcare-associated pneumonia)  Thrombocytopenia  Hypomagnesemia  Hypokalemia  Hypernatremia  #1 hypotension Resolved. Likely secondary to sepsis secondary to recurrent probable necrotizing pneumonia/healthcare associated pneumonia. Systolic blood pressure improved.  Pro calcitonin was elevated at 13 on admission decreasing.  Sputum Gram stain and cultures with fungus ?? Contaminant.. Blood cultures are pending. Urine Legionella is negative. Urine strep pneumococcus is negative.  Vancomycin d/c'd per ID. Zosyn d/c'd per ID. Patient s/p broncoscopy with cultures pending. PCCM following and appreciate input and recommendations. Continue supportive care.  #2 recurrent necrotizing pneumonia/healthcare associated pneumonia/?inflammatory vs fungal vs noninfectiuos vs BOOP This is patient's third hospitalization for right upper lobe pneumonia first episode in June of 2013 second episode in July of 2013 with patient was diagnosed with a necrotizing pneumonia per pulmonary medicine. Patient was discharged home on Levaquin and augmentin for 3 weeks, however concern as to whether patient took full course of antibiotics. Patient is presenting with productive cough, chills, fever, chest x-ray consistent with worsening pneumonia. Sputum Gram stain and cultures are pending. Urine strep pneumococcus is negative. A urine Legionella is negative. Pro calcitonin levels were elevated at 13 on admission and now at 5.61 and  trending down. Lactic acid level was 2.1 on admission and trending down. Will repeat pro calcitonin in the a.m. Sputum with some fungus however likely contaminant.Repeat CT of the chest with continued progression of severe consolidated pneumonia in the right upper lobe superimposed on severe centrilobular emphysema and also do a space disease in the lingula with a peripheral nodular component suggestive of a retinal same process in the right upper lobe, mild airspace disease in the right lower lobe. Pulmonary critical care is following and patient s/p bronchoscopy.  IV Zosyn d/c'd. Vancomycin d/c'd per ID. Nebs as needed. Mucinex. Tessalon Perles.  CXR worsening. Pulmonary and critical care following. Patient is status post bronchoscopy which had the turbid-appearing fluid with elevated WBCs and elevated neutrophil count. Some growth of fungus in BAL. Cultures are pending. Antibiotics discontinued for now. We'll await cultures. Patient started on steroids. Pulmonary following and appreciate input and recommendations. ID ff and appreciate input and rxcs.   #3 sepsis Likely secondary to problem #2. Urinalysis is negative. Chest x-ray consistent with recurrent worsening pneumonia. CT of the chest with multilobar and pneumonia and progression of worsening recurrent right upper lobe pneumonia .Blood cultures are pending. Pro calcitonin level was elevated on admission at 13 and trending down. Lactic acid level was 2.1 and trending down. Urine strep pneumococcus is negative. Urine Legionella is negative. Sputum with some fungus however likely contaminant.  IV Zosyn d/c'd per ID. Vancomycin d/c'd per ID. patient is status post bronchoscopy with cultures pending however fluid noted to be brown/purulent with increased white blood cells turbid in nature with an elevated neutrophil count. Growth of some fungus in BAL. All antibiotics have been discontinued at this time per ID. patient be started on oral prednisone per  pulmonary. Awaiting culture results prior to resuming antibiotics if needed per pulmonary. Pulmonary and critical care medicine ff. ID ff pt s/p bronchoscopy.  #4 acute on  chronic kidney disease stage III Patient's baseline creatinine is approximately 1.4-1.7. Creatinine on admission was 2.8. Likely secondary to a prerenal azotemia initially. 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 Patient is currently euvolemic. Renal function worsening with unknown etiology.?? Sec to chemo.. Creatinine worsening currently at 3.36 from 3.03 from 2.83 from 2.52 from 2.32 from 2.25 from 2.21 and trending back up. Patient was given a dose of Lasix orally prior to starting steroids per pulmonary.. Renal ultrasound normal. Continue supportive care follow. Continue bicarbonate tablets. Renal ff.. Follow.  #5 thrombocytopenia/Anemia Likely secondary to problem #3 and afinitor versus consumptive state.. Patient is status post 3 unit of packed red blood cells. Afinitor has been discontinued. Hgb is now 9.3 from 11.3 from 10.5 from 9.7 from 7.6. LDH WNL.  Haptoglobin elevated at 409. Peripheral smear negative for schistocytes, however with elevated d dimer and fibrinogen. ???DIC. Platelets trending down and at 16 yesterday. Patient was transfused one bag of platelets. Platelet count went up to 80 and is back down to 49 today. Patient denies any active bleeding. Repeat DIC panel with elevated d-dimer and fibrinogen levels. No schistocytes. Question as to whether patient may have some, consumptive coagulopathy. Hemoglobin is also trending down. Follow. Hematology ff. Continue vit K as PTT AND PT elevated. Discussed with Dr Arline Asp of heme/onc. Patient has also been started on steroids per pulmonary. Monitor for now. Hematology followed and appreciate input and recommendations.   #6 COPD Stable. Nebs when necessary. Patient started on steroids secondary to  problem #1.  #7 metabolic acidosis Likely secondary to sepsis versus acute on chronic kidney disease stage III. Improved with HCO3 in IVF. D/C'd HCO3 4 days ago and started oral bicarbonate. Oral bicarbonate dose increased. Follow. See problem #3.  #8 hypomagnesemia/hypokalemia Repleted.  #9 hypernatremia Improved with change of IV fluids. Change IV fluids to D5W at [facility].  #10 constipation Resolved with MiraLAX. Colace 100 mg twice a day.   #11 Hodgkin's lymphoma, relapse Afinitor has been discontinued secondary to his anemia, thrombocytopenia, sepsis. Hematology oncology is following.  #12 prophylaxis Pepcid for GI prophylaxis. SCDs for DVT prophylaxis.  #13: Code Status Discussed CODE STATUS with patient after reviewing Dr. Mamie Levers note from 03/17/2011. Patient is in agreement with a Limited code. No CPR. No mechanical ventilation. May use pressors or antiarrhythmics.   Code Status: Limited code. Family Communication: Updated patient and sister via telephone. Updated patient at bedside.  Disposition Plan: Home when medically stable vs SNF.   Brief narrative: Very pleasant 61 yo with hx copd, hodgkins lymphoma, necrotizing pna presents to ED cc sob and cough. Information provided by painted. States symptoms began 3 days ago have persisted and worsened. Associated symptoms include chills, subjective fever, pleuritic chest pain and sinus congestion as well as some nausea and 3 episodes of vomiting. Reports emesis clear without dk coffee ground substances. Denies dysuria/hematuria abdominal pain or diarrhea. In ED pt was tachycardic, hypotensive and chest xray concerning for pna. Of note pt was discharged from hospital 7/26 after 5 day stay for similar symptoms. Was discharge on levaquin to be taken for 3 week. [facility] asked to admit      Consultants:  Pulmonary: Dr Wert/Dr. Craige Cotta 03/24/12   Hematolgy/Oncology: Dr Darrold Span 03/24/12  Renal : Dr. Arlean Hopping 03/28/12  Procedures:  CXR   03/23/12  Status post 1 unit of packed red blood cells on 03/23/2012  V/Q scan 03/24/12  CT Chest 03/24/12  Renal US  03/24/12  2 units PRBC  03/25/12  Platelets 1 bag 03/29/12  Bronchial alveolar lavage 03/29/2012  Platelets 1 bag  8 /17/ 2013  Antibiotics:  IV Vancomycin 03/23/12---> 03/26/12  IV Zosyn 03/23/12--->03/28/12  HPI/Subjective: Patient sitting up in bed. Patient denies any bleeding. Family at bedside.  Objective: Filed Vitals:   03/31/12 2014 03/31/12 2127 04/01/12 0439 04/01/12 0905  BP:  129/73 138/85   Pulse:  98 72   Temp:  97.8 F (36.6 C) 97.4 F (36.3 C)   TempSrc:  Oral Oral   Resp:  20 20   Height:      Weight:   58.8 kg (129 lb 10.1 oz)   SpO2: 97% 92% 100% 98%    Intake/Output Summary (Last 24 hours) at 04/01/12 1138 Last data filed at 04/01/12 0400  Gross per 24 hour  Intake    140 ml  Output    250 ml  Net   -110 ml    Exam: General: Cachectic. Sitting in chair. Coughing. HEENT: No bruits, no goiter. Heart: Tachycardia, without murmurs, rubs, gallops. Lungs: CTAB No wheezes. Abdomen: Soft, nontender, nondistended, positive bowel sounds. Extremities: No clubbing cyanosis or edema with positive pedal pulses. Neuro: Grossly intact, nonfocal.  Data Reviewed: Basic Metabolic Panel:  Lab 04/01/12 1610 03/31/12 0523 03/30/12 0345 03/29/12 0510 03/28/12 0410 03/27/12 0450 03/26/12 0515  NA 146* 145 145 144 144 -- --  K 4.0 4.2 3.8 3.6 3.6 -- --  CL 113* 113* 113* 112 113* -- --  CO2 18* 17* 16* 18* 17* -- --  GLUCOSE 131* 188* 50* 121* 77 -- --  BUN 56* 46* 36* 34* 29* -- --  CREATININE 3.36* 3.03* 2.83* 2.52* 2.32* -- --  CALCIUM 8.2* 8.5 8.0* 8.2* 8.2* -- --  MG -- -- -- -- 2.2 1.6 1.9  PHOS 4.8* 5.1* 4.9* -- -- -- --   Liver Function Tests:  Lab 04/01/12 0408 03/31/12 0523 03/30/12 0345  AST -- 8 --  ALT -- 5 --  ALKPHOS -- 330* --  BILITOT -- 0.2* --  PROT -- 4.9* --  ALBUMIN 1.5* 1.3* 1.3*   No results found for this  basename: LIPASE:5,AMYLASE:5 in the last 168 hours No results found for this basename: AMMONIA:5 in the last 168 hours CBC:  Lab 04/01/12 0404 03/31/12 1438 03/31/12 0524 03/31/12 0523 03/30/12 0345 03/29/12 0950 03/29/12 0510 03/28/12 0410 03/26/12 0515  WBC 15.8* 19.8* -- 18.1* 10.8* 12.3* -- -- --  NEUTROABS 14.4* -- -- -- 9.6* -- 10.4* 10.0* 6.6  HGB 9.3* 11.2* -- 11.3* 9.8* 9.8* -- -- --  HCT 26.5* 32.4* -- 32.7* 28.4* 28.5* -- -- --  MCV 81.0 82.4 -- 82.0 82.3 81.2 -- -- --  PLT 49* 80* 16* 16* 20* -- -- -- --   Cardiac Enzymes:  Lab 03/25/12 2055  CKTOTAL 24  CKMB 1.7  CKMBINDEX --  TROPONINI <0.30   BNP (last 3 results)  Basename 03/24/12 1149 03/05/12 2100 02/22/12 1600  PROBNP 1538.0* 479.3* 1118.0*   CBG: No results found for this basename: GLUCAP:5 in the last 168 hours  Recent Results (from the past 240 hour(s))  CULTURE, BLOOD (ROUTINE X 2)     Status: Normal   Collection Time   03/23/12 11:24 AM      Component Value Range Status Comment   Specimen Description BLOOD PORTA CATH   Final    Special Requests BOTTLES DRAWN AEROBIC AND ANAEROBIC 3CC   Final  Culture  Setup Time 03/24/2012 01:00   Final    Culture NO GROWTH 5 DAYS   Final    Report Status 03/30/2012 FINAL   Final   CULTURE, BLOOD (ROUTINE X 2)     Status: Normal   Collection Time   03/23/12 11:31 AM      Component Value Range Status Comment   Specimen Description BLOOD LEFT HAND   Final    Special Requests BOTTLES DRAWN AEROBIC AND ANAEROBIC 4CC   Final    Culture  Setup Time 03/24/2012 01:00   Final    Culture NO GROWTH 5 DAYS   Final    Report Status 03/30/2012 FINAL   Final   MRSA PCR SCREENING     Status: Normal   Collection Time   03/23/12  2:37 PM      Component Value Range Status Comment   MRSA by PCR NEGATIVE  NEGATIVE Final   CULTURE, EXPECTORATED SPUTUM-ASSESSMENT     Status: Normal   Collection Time   03/23/12  3:33 PM      Component Value Range Status Comment   Specimen Description  SPUTUM   Final    Special Requests NONE   Final    Sputum evaluation     Final    Value: THIS SPECIMEN IS ACCEPTABLE. RESPIRATORY CULTURE REPORT TO FOLLOW.   Report Status 03/23/2012 FINAL   Final   CULTURE, RESPIRATORY     Status: Normal   Collection Time   03/23/12  4:05 PM      Component Value Range Status Comment   Specimen Description SPUTUM   Final    Special Requests NONE   Final    Gram Stain     Final    Value: NO WBC SEEN     NO SQUAMOUS EPITHELIAL CELLS SEEN     NO ORGANISMS SEEN   Culture     Final    Value: FEW FUNGUS (MOLD) ISOLATED, PROBABLE CONTAMINANT/COLONIZER (SAPROPHYTE). CONTACT MICROBIOLOGY IF FURTHER IDENTIFICATION REQUIRED [phone].   Report Status 03/26/2012 FINAL   Final   AFB CULTURE WITH SMEAR     Status: Normal (Preliminary result)   Collection Time   03/29/12 10:42 AM      Component Value Range Status Comment   Specimen Description BRONCHIAL ALVEOLAR LAVAGE LEFT   Final    Special Requests Immunocompromised   Final    ACID FAST SMEAR NO ACID FAST BACILLI SEEN   Final    Culture     Final    Value: CULTURE WILL BE EXAMINED FOR 6 WEEKS BEFORE ISSUING A FINAL REPORT   Report Status PENDING   Incomplete   FUNGUS CULTURE W SMEAR     Status: Normal (Preliminary result)   Collection Time   03/29/12 10:42 AM      Component Value Range Status Comment   Specimen Description BRONCHIAL ALVEOLAR LAVAGE LEFT   Final    Special Requests Immunocompromised   Final    Fungal Smear NO YEAST OR FUNGAL ELEMENTS SEEN   Final    Culture YEAST ISOLATED;ID TO FOLLOW   Final    Report Status PENDING   Incomplete   CULTURE, BAL-QUANTITATIVE     Status: Normal (Preliminary result)   Collection Time   03/29/12 10:42 AM      Component Value Range Status Comment   Specimen Description BRONCHIAL ALVEOLAR LAVAGE LEFT   Final    Special Requests NONE   Final    Gram Stain PENDING  Incomplete    Colony Count 3,000 COLONIES/ML   Final    Culture YEAST CONSISTENT WITH CANDIDA  SPECIES   Final    Report Status PENDING   Incomplete   FUNGUS CULTURE W SMEAR     Status: Normal (Preliminary result)   Collection Time   03/29/12 11:54 AM      Component Value Range Status Comment   Specimen Description BRONCHIAL ALVEOLAR LAVAGE RIGHT   Final    Special Requests Immunocompromised   Final    Fungal Smear RARE YEAST WITH PSEUDOHYPHAE   Final    Culture YEAST ISOLATED;ID TO FOLLOW   Final    Report Status PENDING   Incomplete   CULTURE, BAL-QUANTITATIVE     Status: Normal   Collection Time   03/29/12 11:54 AM      Component Value Range Status Comment   Specimen Description BRONCHIAL ALVEOLAR LAVAGE RIGHT   Final    Special Requests NONE   Final    Gram Stain     Final    Value: FEW WBC PRESENT,BOTH PMN AND MONONUCLEAR     NO SQUAMOUS EPITHELIAL CELLS SEEN     NO ORGANISMS SEEN   Colony Count 20,OOO COLONIES/ML   Final    Culture YEAST CONSISTENT WITH CANDIDA SPECIES   Final    Report Status 03/31/2012 FINAL   Final   MRSA PCR SCREENING     Status: Normal   Collection Time   03/29/12  3:33 PM      Component Value Range Status Comment   MRSA by PCR NEGATIVE  NEGATIVE Final      Studies: Dg Chest 2 View  03/23/2012  *RADIOLOGY REPORT*  Clinical Data: Cough.  Pleurisy.  Short of breath.  History of cancer.  CHEST - 2 VIEW  Comparison: 03/05/2012.  Findings: Progressive airspace disease is present in the right upper lobe, best seen on the lateral view.  When comparing lateral views to prior chest radiograph 03/05/2012, there is continued/progressive consolidation of the right upper lobe.  Support apparatus appears unchanged.  There is some faint airspace disease in the left midlung as well.  Cardiopericardial silhouette unchanged.  Right IJ power port appears similar.  IMPRESSION: Progressive consolidation of the right upper lobe, best seen on the lateral view.  Faint airspace opacity in the left midlung, which may also represent pneumonia.  Original Report Authenticated By:  Andreas Newport, M.D.   Dg Chest 2 View  03/05/2012  *RADIOLOGY REPORT*  Clinical Data: Respiratory distress.  CHEST - 2 VIEW  Comparison: 02/27/2012.  Findings: The power port is stable.  The right upper lobe airspace process shows a slight decrease and consolidation.  The remainder the lungs are clear.  IMPRESSION: Persistent but slight improved right upper lobe pneumonia.  Original Report Authenticated By: P. Loralie Champagne, M.D.   Dg Chest 2 View  02/27/2012  *RADIOLOGY REPORT*  Clinical Data: Cough, congestion.  CHEST - 2 VIEW  Comparison: 02/22/2012  Findings: Right Port-A-Cath remains in place, unchanged.  Dense consolidation in the right upper lobe is stable.  Left lung is clear.  Heart is normal size.  No effusions.  IMPRESSION: Stable dense right upper lobe consolidation.  Original Report Authenticated By: Cyndie Chime, M.D.   Ct Chest Wo Contrast  02/23/2012  *RADIOLOGY REPORT*  Clinical Data: Right lower lobe infiltrate.  Non Hodgkin lymphoma. Shortness of breath.  CT CHEST WITHOUT CONTRAST  Technique:  Multidetector CT imaging of the chest was performed following the standard  protocol without IV contrast.  Comparison: PET 12/21/2011. CT chest 12/18/2008.  Findings: No pathologically enlarged mediastinal or axillary lymph nodes.  The hilar regions are difficult to definitively evaluate without IV contrast.  Coronary artery calcification.  Heart size normal.  No pericardial effusion.  Emphysema.  Airspace consolidation with internal rounded lucencies involving the majority of the anterior and posterior segment of the right upper lobe.  Associated air bronchograms.  Additional consolidation is seen in the medial aspect of the right lower lobe, with at least one air-fluid level (image 30), possibly with a previous site of bullous disease, as seen on 12/18/2008. Additional scattered peribronchovascular nodularity and mild consolidation in the right lower lobe, inferiorly.  7 mm subpleural left  upper lobe nodule (image 36) is unchanged from 12/18/2008, indicating a benign etiology.  No pleural fluid.  Airway is unremarkable.  Incidental imaging of the upper abdomen shows no acute findings. No worrisome lytic or sclerotic lesions.  IMPRESSION:  Right upper and right lower lobe pneumonia. Associated fluid in multiple bullous lesions in the right upper and right lower lobes.  Original Report Authenticated By: Reyes Ivan, M.D.   Dg Esophagus  03/08/2012  *RADIOLOGY REPORT*  Clinical Data: Dysphagia.  ESOPHOGRAM / BARIUM SWALLOW  Technique:  Combined double contrast and single contrast examination performed using effervescent crystals, thick barium liquid, and thin barium liquid.  Fluoroscopy time:  1.2 minutes.  Comparison: 02/23/2012  Findings:  The cervical and thoracic esophagus are patent.  No high- grade stricture or mass.  Normal motility of the esophagus.  The patient ingested a 13 mm barium tablet which easily passed through the esophagus and into the stomach.  No reflux identified.  IMPRESSION:  1.  Normal esophagram.  Original Report Authenticated By: Rosealee Albee, M.D.   Dg Abd 2 Views  02/26/2012  *RADIOLOGY REPORT*  Clinical Data: Nausea and vomiting.  Hodgkin's lymphoma.  COPD.  ABDOMEN - 2 VIEW  Comparison: PET of 12/21/2011.  Findings: Upright view abdomen and a supine view of the abdomen and pelvis.  Upright view abdomen demonstrates no free intraperitoneal air or significant air fluid levels.  Interstitial thickening at the lung bases without lobar consolidation.  Supine view of the abdomen pelvis demonstrates a large colonic stool burden.  Distal gas.  No bowel distention.  Minimal convex right lumbar spine curvature.  IMPRESSION: Colonic stool burden suggests constipation.  No acute findings.  Original Report Authenticated By: Consuello Bossier, M.D.    Scheduled Meds:    . sodium chloride   Intravenous Once  . acetaminophen  650 mg Oral Once  . albuterol  2.5 mg  Nebulization BID  . antiseptic oral rinse  15 mL Mouth Rinse q12n4p  . benzonatate  200 mg Oral TID  . chlorhexidine  15 mL Mouth Rinse BID  . diphenhydrAMINE  25 mg Oral Once  . docusate sodium  100 mg Oral BID  . feeding supplement  237 mL Oral BID BM  . guaiFENesin-dextromethorphan  15 mL Oral Q6H  . ipratropium  0.5 mg Nebulization BID  . megestrol  400 mg Oral BID  . multivitamin with minerals  1 tablet Oral Daily  . pantoprazole sodium  40 mg Oral Q1200  . phytonadione  5 mg Oral Daily  . potassium chloride SA  20 mEq Oral Daily  . predniSONE  60 mg Oral Q breakfast  . sodium bicarbonate  1,300 mg Oral TID  . sodium chloride  3 mL Intravenous Q12H  Continuous Infusions:    . sodium chloride 10 mL/hr at 03/30/12 2000    Principal Problem:  *Sepsis Active Problems:  Hodgkin's lymphoma in relapse  COPD (chronic obstructive pulmonary disease)  Hyperlipidemia  Hypotension  Acute on chronic renal failure  Iron deficiency anemia  CKD (chronic kidney disease), stage III  Metabolic acidosis  Necrotizing pneumonia  SIRS (systemic inflammatory response syndrome)  HCAP (healthcare-associated pneumonia)  Thrombocytopenia  Hypomagnesemia  Hypokalemia  Hypernatremia    Time spent: 40 mins    [place]  [facility] Pager [phone]. If 8PM-8AM, please contact night-coverage at [email or web address], password [facility] 04/01/2012, 11:38 AM  LOS: 9 days

## 2012-04-02 ENCOUNTER — Inpatient Hospital Stay: Payer: Medicare Other | Admitting: Adult Health

## 2012-04-02 LAB — RENAL FUNCTION PANEL
BUN: 63 mg/dL — ABNORMAL HIGH (ref 6–23)
CO2: 21 mEq/L (ref 19–32)
Chloride: 110 mEq/L (ref 96–112)
Creatinine, Ser: 3.22 mg/dL — ABNORMAL HIGH (ref 0.50–1.35)

## 2012-04-02 LAB — APTT: aPTT: 58 seconds — ABNORMAL HIGH (ref 24–37)

## 2012-04-02 LAB — CBC WITH DIFFERENTIAL/PLATELET
Basophils Relative: 0 % (ref 0–1)
Eosinophils Relative: 0 % (ref 0–5)
HCT: 25.7 % — ABNORMAL LOW (ref 39.0–52.0)
Hemoglobin: 9 g/dL — ABNORMAL LOW (ref 13.0–17.0)
Lymphs Abs: 0.4 10*3/uL — ABNORMAL LOW (ref 0.7–4.0)
MCH: 28.2 pg (ref 26.0–34.0)
MCV: 80.6 fL (ref 78.0–100.0)
Monocytes Absolute: 0.8 10*3/uL (ref 0.1–1.0)
RBC: 3.19 MIL/uL — ABNORMAL LOW (ref 4.22–5.81)

## 2012-04-02 LAB — LUPUS ANTICOAGULANT PANEL
PTTLA 4:1 Mix: 63.2 secs — ABNORMAL HIGH (ref 28.0–43.0)
PTTLA Confirmation: 30.9 secs — ABNORMAL HIGH (ref <8.0)
dRVVT Incubated 1:1 Mix: 47.7 secs — ABNORMAL HIGH (ref <45.1)

## 2012-04-02 MED ORDER — POTASSIUM CHLORIDE CRYS ER 20 MEQ PO TBCR
40.0000 meq | EXTENDED_RELEASE_TABLET | ORAL | Status: AC
  Administered 2012-04-02 (×2): 40 meq via ORAL
  Filled 2012-04-02 (×2): qty 2

## 2012-04-02 NOTE — Progress Notes (Signed)
[facility] PROGRESS NOTE  Keith Bell NWG:[id number] DOB: August 09, 1951 DOA: 03/23/2012 PCP: Eino Farber, MD  Assessment/Plan: Principal Problem:  *Sepsis Active Problems:  Hodgkin's lymphoma in relapse  COPD (chronic obstructive pulmonary disease)  Hyperlipidemia  Hypotension  Acute on chronic renal failure  Iron deficiency anemia  CKD (chronic kidney disease), stage III  Metabolic acidosis  Necrotizing pneumonia  SIRS (systemic inflammatory response syndrome)  HCAP (healthcare-associated pneumonia)  Thrombocytopenia  Hypomagnesemia  Hypokalemia  Hypernatremia  #1 hypotension Resolved. Likely secondary to sepsis secondary to recurrent probable necrotizing pneumonia/healthcare associated pneumonia. Systolic blood pressure improved.  Pro calcitonin was elevated at 13 on admission decreasing.  Sputum Gram stain and cultures with fungus ?? Contaminant.. Blood cultures neg x 2. Urine Legionella is negative. Urine strep pneumococcus is negative.  Vancomycin d/c'd per ID. Zosyn d/c'd per ID. Patient s/p broncoscopy with cultures pending. PCCM following and appreciate input and recommendations. Continue supportive care.  #2 recurrent necrotizing pneumonia/healthcare associated pneumonia/?inflammatory vs fungal vs noninfectiuos vs BOOP This is patient's third hospitalization for right upper lobe pneumonia first episode in June of 2013 second episode in July of 2013 with patient was diagnosed with a necrotizing pneumonia per pulmonary medicine. Patient was discharged home on Levaquin and augmentin for 3 weeks, however concern as to whether patient took full course of antibiotics. Patient is presenting with productive cough, chills, fever, chest x-ray consistent with worsening pneumonia. Sputum Gram stain and cultures are pending. Urine strep pneumococcus is negative. Blood cx neg x 2. A urine Legionella is negative. Pro calcitonin levels were elevated at 13 on admission and now at  5.61 and trending down. Lactic acid level was 2.1 on admission and trending down. Will repeat pro calcitonin in the a.m. Sputum with some fungus however likely contaminant.Repeat CT of the chest with continued progression of severe consolidated pneumonia in the right upper lobe superimposed on severe centrilobular emphysema and also do a space disease in the lingula with a peripheral nodular component suggestive of a retinal same process in the right upper lobe, mild airspace disease in the right lower lobe. Pulmonary critical care is following and patient s/p bronchoscopy.  IV Zosyn d/c'd. Vancomycin d/c'd per ID. Nebs as needed. Mucinex. Tessalon Perles.  CXR worsening. Pulmonary and critical care following. Patient is status post bronchoscopy which had the turbid-appearing fluid with elevated WBCs and elevated neutrophil count. Some growth of fungus in BAL. Cultures are pending. Antibiotics discontinued for now. We'll await cultures. Patient started on steroids. Pulmonary following and appreciate input and recommendations. ID ff and appreciate input and rxcs.   #3 sepsis Likely secondary to problem #2. Urinalysis is negative. Chest x-ray consistent with recurrent worsening pneumonia. CT of the chest with multilobar and pneumonia and progression of worsening recurrent right upper lobe pneumonia .Blood cultures are pending. Pro calcitonin level was elevated on admission at 13 and trending down. Lactic acid level was 2.1 and trending down. Urine strep pneumococcus is negative. Urine Legionella is negative. Sputum with some fungus however likely contaminant.  IV Zosyn d/c'd per ID. Vancomycin d/c'd per ID. patient is status post bronchoscopy with cultures pending however fluid noted to be brown/purulent with increased white blood cells turbid in nature with an elevated neutrophil count. Growth of some fungus in BAL. All antibiotics have been discontinued at this time per ID. patient be started on oral prednisone  per pulmonary. Awaiting culture results prior to resuming antibiotics if needed per pulmonary. Pulmonary and critical care medicine ff. ID ff pt  s/p bronchoscopy.  #4 acute on chronic kidney disease stage III Patient's baseline creatinine is approximately 1.4-1.7. Creatinine on admission was 2.8. Likely secondary to a prerenal azotemia initially. 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 Patient is currently euvolemic. Renal function worsening with unknown etiology.?? Sec to chemo.. Creatinine worsening currently at  3.22 from 3.36 from 3.03 from 2.83 from 2.52 from 2.32 from 2.25 from 2.21 and trending back up. Patient was given a dose of Lasix orally prior to starting steroids per pulmonary.. Renal ultrasound normal. Continue supportive care follow. Continue bicarbonate tablets. Follow.  #5 thrombocytopenia/Anemia Likely secondary to problem #3 and afinitor versus consumptive state.. Patient is status post 3 unit of packed red blood cells. Afinitor has been discontinued. Hgb is now 9.0 from 9.3 from 11.3 from 10.5 from 9.7 from 7.6. LDH WNL.  Haptoglobin elevated at 409. Peripheral smear negative for schistocytes, however with elevated d dimer and fibrinogen. ???DIC. Platelets trending down and at 16 yesterday. Patient was transfused one bag of platelets. Platelet count went up to 80 and is back down to 49 yesterday and 31 today. Patient denies any active bleeding. Repeat DIC panel with elevated d-dimer and fibrinogen levels. No schistocytes. Question as to whether patient may have some, consumptive coagulopathy. Hemoglobin is also trending down. Follow. Hematology ff. Continue vit K as PTT AND PT elevated. Discussed with Dr Arline Asp of heme/onc on friday. Patient has also been started on steroids per pulmonary. Monitor for now. Hematology following and appreciate input and recommendations.   #6 COPD Stable. Nebs when necessary.  Patient started on steroids secondary to problem #1.  #7 metabolic acidosis Likely secondary to sepsis versus acute on chronic kidney disease stage III. Improved with HCO3 in IVF initially. D/C'd HCO3 5 days ago and started oral bicarbonate. Oral bicarbonate dose increased. Follow. See problem #3.  #8 hypomagnesemia/hypokalemia Replete potassium.  #9 hypernatremia Improved with change of IV fluids.   #10 constipation Resolved with MiraLAX. Colace 100 mg twice a day.   #11 Hodgkin's lymphoma, relapse Afinitor has been discontinued secondary to his anemia, thrombocytopenia, sepsis. Hematology oncology is following.  #12 prophylaxis Pepcid for GI prophylaxis. SCDs for DVT prophylaxis.  #13: Code Status Discussed CODE STATUS with patient after reviewing Dr. Mamie Levers note from 03/17/2011. Patient is in agreement with a Limited code. No CPR. No mechanical ventilation. May use pressors or antiarrhythmics.   Code Status: Limited code. Family Communication: Updated patient and family at bedside. Disposition Plan: Home when medically stable vs SNF.   Brief narrative: Very pleasant 61 yo with hx copd, hodgkins lymphoma, necrotizing pna presents to ED cc sob and cough. Information provided by painted. States symptoms began 3 days ago have persisted and worsened. Associated symptoms include chills, subjective fever, pleuritic chest pain and sinus congestion as well as some nausea and 3 episodes of vomiting. Reports emesis clear without dk coffee ground substances. Denies dysuria/hematuria abdominal pain or diarrhea. In ED pt was tachycardic, hypotensive and chest xray concerning for pna. Of note pt was discharged from hospital 7/26 after 5 day stay for similar symptoms. Was discharge on levaquin to be taken for 3 week. [facility] asked to admit      Consultants:  Pulmonary: Dr Wert/Dr. Craige Cotta 03/24/12   Hematolgy/Oncology: Dr Darrold Span 03/24/12  Renal : Dr. Arlean Hopping 03/28/12  Procedures:  CXR   03/23/12  Status post 1 unit of packed red blood cells on 03/23/2012  V/Q scan 03/24/12  CT  Chest 03/24/12  Renal US 03/24/12  2 units PRBC  03/25/12  Platelets 1 bag 03/29/12  Bronchial alveolar lavage 03/29/2012  Platelets 1 bag  8 /17/ 2013  Antibiotics:  IV Vancomycin 03/23/12---> 03/26/12  IV Zosyn 03/23/12--->03/28/12  HPI/Subjective: Patient in bed. Patient denies any bleeding. Family at bedside. Patient with cough. Patient states breathing ok.  Objective: Filed Vitals:   04/01/12 2137 04/02/12 0542 04/02/12 0640 04/02/12 1053  BP: 147/81 143/83 144/93   Pulse: 96 97 88   Temp: 97.9 F (36.6 C) 97.4 F (36.3 C) 97.3 F (36.3 C)   TempSrc: Oral Oral Oral   Resp: 20 19 20    Height:      Weight:      SpO2: 94% 98% 95% 95%    Intake/Output Summary (Last 24 hours) at 04/02/12 1352 Last data filed at 04/02/12 0817  Gross per 24 hour  Intake 304.66 ml  Output   2450 ml  Net -2145.34 ml    Exam: General: Cachectic. In bed. Coughing. HEENT: No bruits, no goiter. Heart: Tachycardia, without murmurs, rubs, gallops. Lungs:  Coarse BS. Abdomen: Soft, nontender, nondistended, positive bowel sounds. Extremities: No clubbing cyanosis. 1-2 + BLE edema with positive pedal pulses. Neuro: Grossly intact, nonfocal.  Data Reviewed: Basic Metabolic Panel:  Lab 04/02/12 1610 04/01/12 0408 03/31/12 0523 03/30/12 0345 03/29/12 0510 03/28/12 0410 03/27/12 0450  NA 144 146* 145 145 144 -- --  K 3.2* 4.0 4.2 3.8 3.6 -- --  CL 110 113* 113* 113* 112 -- --  CO2 21 18* 17* 16* 18* -- --  GLUCOSE 164* 131* 188* 50* 121* -- --  BUN 63* 56* 46* 36* 34* -- --  CREATININE 3.22* 3.36* 3.03* 2.83* 2.52* -- --  CALCIUM 8.1* 8.2* 8.5 8.0* 8.2* -- --  MG -- -- -- -- -- 2.2 1.6  PHOS 4.1 4.8* 5.1* 4.9* -- -- --   Liver Function Tests:  Lab 04/02/12 0500 04/01/12 0408 03/31/12 0523 03/30/12 0345  AST -- -- 8 --  ALT -- -- 5 --  ALKPHOS -- -- 330* --  BILITOT -- -- 0.2* --  PROT -- --  4.9* --  ALBUMIN 1.6* 1.5* 1.3* 1.3*   No results found for this basename: LIPASE:5,AMYLASE:5 in the last 168 hours No results found for this basename: AMMONIA:5 in the last 168 hours CBC:  Lab 04/02/12 0535 04/01/12 0404 03/31/12 1438 03/31/12 0524 03/31/12 0523 03/30/12 0345 03/29/12 0510 03/28/12 0410  WBC 12.5* 15.8* 19.8* -- 18.1* 10.8* -- --  NEUTROABS 11.3* 14.4* -- -- -- 9.6* 10.4* 10.0*  HGB 9.0* 9.3* 11.2* -- 11.3* 9.8* -- --  HCT 25.7* 26.5* 32.4* -- 32.7* 28.4* -- --  MCV 80.6 81.0 82.4 -- 82.0 82.3 -- --  PLT 31* 49* 80* 16* 16* -- -- --   Cardiac Enzymes: No results found for this basename: CKTOTAL:5,CKMB:5,CKMBINDEX:5,TROPONINI:5 in the last 168 hours BNP (last 3 results)  Basename 03/24/12 1149 03/05/12 2100 02/22/12 1600  PROBNP 1538.0* 479.3* 1118.0*   CBG: No results found for this basename: GLUCAP:5 in the last 168 hours  Recent Results (from the past 240 hour(s))  MRSA PCR SCREENING     Status: Normal   Collection Time   03/23/12  2:37 PM      Component Value Range Status Comment   MRSA by PCR NEGATIVE  NEGATIVE Final   CULTURE, EXPECTORATED SPUTUM-ASSESSMENT     Status: Normal   Collection Time   03/23/12  3:33 PM  Component Value Range Status Comment   Specimen Description SPUTUM   Final    Special Requests NONE   Final    Sputum evaluation     Final    Value: THIS SPECIMEN IS ACCEPTABLE. RESPIRATORY CULTURE REPORT TO FOLLOW.   Report Status 03/23/2012 FINAL   Final   CULTURE, RESPIRATORY     Status: Normal   Collection Time   03/23/12  4:05 PM      Component Value Range Status Comment   Specimen Description SPUTUM   Final    Special Requests NONE   Final    Gram Stain     Final    Value: NO WBC SEEN     NO SQUAMOUS EPITHELIAL CELLS SEEN     NO ORGANISMS SEEN   Culture     Final    Value: FEW FUNGUS (MOLD) ISOLATED, PROBABLE CONTAMINANT/COLONIZER (SAPROPHYTE). CONTACT MICROBIOLOGY IF FURTHER IDENTIFICATION REQUIRED [phone].   Report Status  03/26/2012 FINAL   Final   AFB CULTURE WITH SMEAR     Status: Normal (Preliminary result)   Collection Time   03/29/12 10:42 AM      Component Value Range Status Comment   Specimen Description BRONCHIAL ALVEOLAR LAVAGE LEFT   Final    Special Requests Immunocompromised   Final    ACID FAST SMEAR NO ACID FAST BACILLI SEEN   Final    Culture     Final    Value: CULTURE WILL BE EXAMINED FOR 6 WEEKS BEFORE ISSUING A FINAL REPORT   Report Status PENDING   Incomplete   FUNGUS CULTURE W SMEAR     Status: Normal (Preliminary result)   Collection Time   03/29/12 10:42 AM      Component Value Range Status Comment   Specimen Description BRONCHIAL ALVEOLAR LAVAGE LEFT   Final    Special Requests Immunocompromised   Final    Fungal Smear NO YEAST OR FUNGAL ELEMENTS SEEN   Final    Culture YEAST ISOLATED;ID TO FOLLOW   Final    Report Status PENDING   Incomplete   CULTURE, BAL-QUANTITATIVE     Status: Normal   Collection Time   03/29/12 10:42 AM      Component Value Range Status Comment   Specimen Description BRONCHIAL ALVEOLAR LAVAGE LEFT   Final    Special Requests NONE   Final    Gram Stain     Final    Value: FEW WBC PRESENT,BOTH PMN AND MONONUCLEAR     NO ORGANISMS SEEN   Colony Count 3,000 COLONIES/ML   Final    Culture YEAST CONSISTENT WITH CANDIDA SPECIES   Final    Report Status 04/01/2012 FINAL   Final   FUNGUS CULTURE W SMEAR     Status: Normal (Preliminary result)   Collection Time   03/29/12 11:54 AM      Component Value Range Status Comment   Specimen Description BRONCHIAL ALVEOLAR LAVAGE RIGHT   Final    Special Requests Immunocompromised   Final    Fungal Smear RARE YEAST WITH PSEUDOHYPHAE   Final    Culture YEAST ISOLATED;ID TO FOLLOW   Final    Report Status PENDING   Incomplete   CULTURE, BAL-QUANTITATIVE     Status: Normal   Collection Time   03/29/12 11:54 AM      Component Value Range Status Comment   Specimen Description BRONCHIAL ALVEOLAR LAVAGE RIGHT   Final     Special Requests NONE   Final  Gram Stain     Final    Value: FEW WBC PRESENT,BOTH PMN AND MONONUCLEAR     NO SQUAMOUS EPITHELIAL CELLS SEEN     NO ORGANISMS SEEN   Colony Count 20,OOO COLONIES/ML   Final    Culture YEAST CONSISTENT WITH CANDIDA SPECIES   Final    Report Status 03/31/2012 FINAL   Final   MRSA PCR SCREENING     Status: Normal   Collection Time   03/29/12  3:33 PM      Component Value Range Status Comment   MRSA by PCR NEGATIVE  NEGATIVE Final      Studies: Dg Chest 2 View  03/23/2012  *RADIOLOGY REPORT*  Clinical Data: Cough.  Pleurisy.  Short of breath.  History of cancer.  CHEST - 2 VIEW  Comparison: 03/05/2012.  Findings: Progressive airspace disease is present in the right upper lobe, best seen on the lateral view.  When comparing lateral views to prior chest radiograph 03/05/2012, there is continued/progressive consolidation of the right upper lobe.  Support apparatus appears unchanged.  There is some faint airspace disease in the left midlung as well.  Cardiopericardial silhouette unchanged.  Right IJ power port appears similar.  IMPRESSION: Progressive consolidation of the right upper lobe, best seen on the lateral view.  Faint airspace opacity in the left midlung, which may also represent pneumonia.  Original Report Authenticated By: Andreas Newport, M.D.   Dg Chest 2 View  03/05/2012  *RADIOLOGY REPORT*  Clinical Data: Respiratory distress.  CHEST - 2 VIEW  Comparison: 02/27/2012.  Findings: The power port is stable.  The right upper lobe airspace process shows a slight decrease and consolidation.  The remainder the lungs are clear.  IMPRESSION: Persistent but slight improved right upper lobe pneumonia.  Original Report Authenticated By: P. Loralie Champagne, M.D.   Dg Chest 2 View  02/27/2012  *RADIOLOGY REPORT*  Clinical Data: Cough, congestion.  CHEST - 2 VIEW  Comparison: 02/22/2012  Findings: Right Port-A-Cath remains in place, unchanged.  Dense consolidation in the  right upper lobe is stable.  Left lung is clear.  Heart is normal size.  No effusions.  IMPRESSION: Stable dense right upper lobe consolidation.  Original Report Authenticated By: Cyndie Chime, M.D.   Ct Chest Wo Contrast  02/23/2012  *RADIOLOGY REPORT*  Clinical Data: Right lower lobe infiltrate.  Non Hodgkin lymphoma. Shortness of breath.  CT CHEST WITHOUT CONTRAST  Technique:  Multidetector CT imaging of the chest was performed following the standard protocol without IV contrast.  Comparison: PET 12/21/2011. CT chest 12/18/2008.  Findings: No pathologically enlarged mediastinal or axillary lymph nodes.  The hilar regions are difficult to definitively evaluate without IV contrast.  Coronary artery calcification.  Heart size normal.  No pericardial effusion.  Emphysema.  Airspace consolidation with internal rounded lucencies involving the majority of the anterior and posterior segment of the right upper lobe.  Associated air bronchograms.  Additional consolidation is seen in the medial aspect of the right lower lobe, with at least one air-fluid level (image 30), possibly with a previous site of bullous disease, as seen on 12/18/2008. Additional scattered peribronchovascular nodularity and mild consolidation in the right lower lobe, inferiorly.  7 mm subpleural left upper lobe nodule (image 36) is unchanged from 12/18/2008, indicating a benign etiology.  No pleural fluid.  Airway is unremarkable.  Incidental imaging of the upper abdomen shows no acute findings. No worrisome lytic or sclerotic lesions.  IMPRESSION:  Right upper and right lower  lobe pneumonia. Associated fluid in multiple bullous lesions in the right upper and right lower lobes.  Original Report Authenticated By: Reyes Ivan, M.D.   Dg Esophagus  03/08/2012  *RADIOLOGY REPORT*  Clinical Data: Dysphagia.  ESOPHOGRAM / BARIUM SWALLOW  Technique:  Combined double contrast and single contrast examination performed using effervescent  crystals, thick barium liquid, and thin barium liquid.  Fluoroscopy time:  1.2 minutes.  Comparison: 02/23/2012  Findings:  The cervical and thoracic esophagus are patent.  No high- grade stricture or mass.  Normal motility of the esophagus.  The patient ingested a 13 mm barium tablet which easily passed through the esophagus and into the stomach.  No reflux identified.  IMPRESSION:  1.  Normal esophagram.  Original Report Authenticated By: Rosealee Albee, M.D.   Dg Abd 2 Views  02/26/2012  *RADIOLOGY REPORT*  Clinical Data: Nausea and vomiting.  Hodgkin's lymphoma.  COPD.  ABDOMEN - 2 VIEW  Comparison: PET of 12/21/2011.  Findings: Upright view abdomen and a supine view of the abdomen and pelvis.  Upright view abdomen demonstrates no free intraperitoneal air or significant air fluid levels.  Interstitial thickening at the lung bases without lobar consolidation.  Supine view of the abdomen pelvis demonstrates a large colonic stool burden.  Distal gas.  No bowel distention.  Minimal convex right lumbar spine curvature.  IMPRESSION: Colonic stool burden suggests constipation.  No acute findings.  Original Report Authenticated By: Consuello Bossier, M.D.    Scheduled Meds:    . sodium chloride   Intravenous Once  . albuterol  2.5 mg Nebulization BID  . antiseptic oral rinse  15 mL Mouth Rinse q12n4p  . benzonatate  200 mg Oral TID  . chlorhexidine  15 mL Mouth Rinse BID  . docusate sodium  100 mg Oral BID  . feeding supplement  237 mL Oral BID BM  . guaiFENesin-dextromethorphan  15 mL Oral Q6H  . ipratropium  0.5 mg Nebulization BID  . megestrol  400 mg Oral BID  . multivitamin with minerals  1 tablet Oral Daily  . pantoprazole sodium  40 mg Oral Q1200  . phytonadione  5 mg Oral Daily  . potassium chloride SA  20 mEq Oral Daily  . potassium chloride  40 mEq Oral Q4H  . predniSONE  60 mg Oral Q breakfast  . sodium bicarbonate  1,300 mg Oral TID  . sodium chloride  3 mL Intravenous Q12H    Continuous Infusions:    . dextrose 20 mL/hr at 04/02/12 0600    Principal Problem:  *Sepsis Active Problems:  Hodgkin's lymphoma in relapse  COPD (chronic obstructive pulmonary disease)  Hyperlipidemia  Hypotension  Acute on chronic renal failure  Iron deficiency anemia  CKD (chronic kidney disease), stage III  Metabolic acidosis  Necrotizing pneumonia  SIRS (systemic inflammatory response syndrome)  HCAP (healthcare-associated pneumonia)  Thrombocytopenia  Hypomagnesemia  Hypokalemia  Hypernatremia    Time spent: 40 mins    [facility]  [facility] Pager [phone]. If 8PM-8AM, please contact night-coverage at [email or web address], password [facility] 04/02/2012, 1:52 PM  LOS: 10 days

## 2012-04-02 NOTE — Progress Notes (Signed)
Name: GRECO GASTELUM MRN: [id number] DOB: 29-May-1951    LOS: 10  Referring Provider: [facility] Reason for Referral:  Pna/ shock  PULMONARY / CRITICAL CARE MEDICINE  HPI: 61 yo male former smoker with hx of Hodgkin's disease with persistent Rt upper lobe infiltrate.  Cultures: Sputum 8/9>>?few fungus MRSA 8/9 > neg BC x 2 8/9 >>>neg Urine strep 8/9 > neg Urine Legionella 8/9 > neg  Antibiotics: Zosyn 8/9 >>>off Vanc 8/9 >>>8/12  Current Status: Wants to home. Increased cough during the night  Vital Signs: Temp:  [97.3 F (36.3 C)-97.9 F (36.6 C)] 97.3 F (36.3 C) (08/19 0640) Pulse Rate:  [88-98] 88  (08/19 0640) Resp:  [19-20] 20  (08/19 0640) BP: (143-147)/(81-93) 144/93 mmHg (08/19 0640) SpO2:  [92 %-99 %] 95 % (08/19 1053) FIO2: 3 lpm  Physical Examination: General - no distress HEENT - no sinus tenderness Cardiac - s1s2 regular Chest - scattered rhonchi, rt porta cath, congested cough Abd - soft, non tender Ext - no edema Neuro - normal strength Skin - no rashes  No results found.8/16: increased basilar airspace disease. Otherwise no sig change.    Lab 04/02/12 0500 04/01/12 0408 03/31/12 0523  NA 144 146* 145  K 3.2* 4.0 4.2  CL 110 113* 113*  CO2 21 18* 17*  BUN 63* 56* 46*  CREATININE 3.22* 3.36* 3.03*  GLUCOSE 164* 131* 188*    Lab 04/02/12 0535 04/01/12 0404 03/31/12 1438  HGB 9.0* 9.3* 11.2*  HCT 25.7* 26.5* 32.4*  WBC 12.5* 15.8* 19.8*  PLT 31* 49* 80*     Lab Results  Component Value Date   ESRSEDRATE 118* 03/26/2012     ASSESSMENT AND PLAN  8/10 CT chest>>Rt upper lobe consolidation, centrilobular emphysema, new ASD lingula, mild ASD RLL 8/15 FOB > No endobronchial lesions. BAL from Lt lingula>>60 ml saline in with 10 ml of thick, yellow to red secretions.   BAL from Rt upper lobe>>120 ml saline in with 20 ml of thick, yellow to red secretions. Then brushing from Rt upper lobe done  A: Persistent Rt upper lung ASD first  noted 01/17/12>>?infection vs inflammatory vs malignancy. S/p FOB 8/15 Fungal 8/15>>yeast BAL 8/15>>> Fluid ct: wbc 2150 neut 94  P: Off  bipap Continue pulm hygiene Hold off on Abx pending culture results  Rx  empiric trial of prednisone 8/16  A: Former smoker with wheezing and ?of COPD. P: Add schedule bronchodilators Should get benefit from prednisone for this also  A: Thrombocytopenia. P:  transfused PLT prior to bronchoscopy    A: Stage IV Hodgkin's disease. P: Oncology following  A: ?HCAP.  ?fungus/mold in sputum from 8/9.  P: Abx per ID  A: Acute renal insufficiency with hx of Stage III CKD.  Non-gap metabolic acidosis. 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   Lab 04/02/12 0500 04/01/12 0408 03/31/12 0523  CREATININE 3.22* 3.36* 3.03*  CO2 21 18* 17*  K 3.2* 4.0 4.2  P: Per primary team  Brett Canales Minor ACNP Adolph Pollack PCCM Pager [phone] till 3 pm If no answer page [phone] 04/02/2012, 12:16 PM  Pt independently  seen and examined and available cxr's reviewed and I agree with above findings/ imp/ plan  Sandrea Hughs, MD Pulmonary and Critical Care Medicine [facility] Cell [phone]

## 2012-04-02 NOTE — Progress Notes (Signed)
Patient cc of SOB. Assessed vital signs (see charting). Pt in no pain at all. Called respiratory therapy to deliver breathing treatment. Pt is breathing easier now.

## 2012-04-02 NOTE — Progress Notes (Signed)
INFECTIOUS DISEASE PROGRESS NOTE  ID: Keith Bell is a 61 y.o. male with  Principal Problem:  *Sepsis Active Problems:  Hodgkin's lymphoma in relapse  COPD (chronic obstructive pulmonary disease)  Hyperlipidemia  Hypotension  Acute on chronic renal failure  Iron deficiency anemia  CKD (chronic kidney disease), stage III  Metabolic acidosis  Necrotizing pneumonia  SIRS (systemic inflammatory response syndrome)  HCAP (healthcare-associated pneumonia)  Thrombocytopenia  Hypomagnesemia  Hypokalemia  Hypernatremia  Subjective: without complaints, wants to know when he can go home  Abtx:  Anti-infectives     Start     Dose/Rate Route Frequency Ordered Stop   03/25/12 1100   vancomycin (VANCOCIN) 750 mg in sodium chloride 0.9 % 150 mL IVPB  Status:  Discontinued        750 mg 150 mL/hr over 60 Minutes Intravenous Every 48 hours 03/23/12 1452 03/25/12 0945   03/25/12 1000   vancomycin (VANCOCIN) 750 mg in sodium chloride 0.9 % 150 mL IVPB  Status:  Discontinued        750 mg 150 mL/hr over 60 Minutes Intravenous Every 24 hours 03/25/12 0945 03/26/12 1444   03/23/12 2100   piperacillin-tazobactam (ZOSYN) IVPB 3.375 g  Status:  Discontinued        3.375 g 12.5 mL/hr over 240 Minutes Intravenous Every 8 hours 03/23/12 1452 03/28/12 1533   03/23/12 1115   vancomycin (VANCOCIN) IVPB 1000 mg/200 mL premix     Comments: BEGIN AFTER BLOOD CULTURES DRAWN      1,000 mg 200 mL/hr over 60 Minutes Intravenous  Once 03/23/12 1109 03/23/12 1238   03/23/12 1115  piperacillin-tazobactam (ZOSYN) IVPB 3.375 g    Comments: BEGIN AFTER BLOOD CULTURES DRAWN     3.375 g 12.5 mL/hr over 240 Minutes Intravenous  Once 03/23/12 1109 03/23/12 1641          Medications:  Scheduled:   . sodium chloride   Intravenous Once  . albuterol  2.5 mg Nebulization BID  . antiseptic oral rinse  15 mL Mouth Rinse q12n4p  . benzonatate  200 mg Oral TID  . chlorhexidine  15 mL Mouth Rinse BID  .  docusate sodium  100 mg Oral BID  . feeding supplement  237 mL Oral BID BM  . guaiFENesin-dextromethorphan  15 mL Oral Q6H  . ipratropium  0.5 mg Nebulization BID  . megestrol  400 mg Oral BID  . multivitamin with minerals  1 tablet Oral Daily  . pantoprazole sodium  40 mg Oral Q1200  . phytonadione  5 mg Oral Daily  . potassium chloride SA  20 mEq Oral Daily  . potassium chloride  40 mEq Oral Q4H  . predniSONE  60 mg Oral Q breakfast  . sodium bicarbonate  1,300 mg Oral TID  . sodium chloride  3 mL Intravenous Q12H    Objective: Vital signs in last 24 hours: Temp:  [97.3 F (36.3 C)-97.9 F (36.6 C)] 97.3 F (36.3 C) (08/19 0640) Pulse Rate:  [88-98] 88  (08/19 0640) Resp:  [19-20] 20  (08/19 0640) BP: (143-147)/(81-93) 144/93 mmHg (08/19 0640) SpO2:  [92 %-99 %] 95 % (08/19 1053)   General appearance: alert, cooperative, cachectic and no distress Resp: coarse sounds on L Chest wall: R chest port- non-tender, no fluctuance Cardio: regular rate and rhythm GI: normal findings: bowel sounds normal and soft, non-tender  Lab Results  Basename 04/02/12 0535 04/02/12 0500 04/01/12 0408 04/01/12 0404  WBC 12.5* -- -- 15.8*  HGB  9.0* -- -- 9.3*  HCT 25.7* -- -- 26.5*  NA -- 144 146* --  K -- 3.2* 4.0 --  CL -- 110 113* --  CO2 -- 21 18* --  BUN -- 63* 56* --  CREATININE -- 3.22* 3.36* --  GLU -- -- -- --   Liver Panel  Basename 04/02/12 0500 04/01/12 0408 03/31/12 0523  PROT -- -- 4.9*  ALBUMIN 1.6* 1.5* --  AST -- -- 8  ALT -- -- 5  ALKPHOS -- -- 330*  BILITOT -- -- 0.2*  BILIDIR -- -- --  IBILI -- -- --   Sedimentation Rate No results found for this basename: ESRSEDRATE in the last 72 hours C-Reactive Protein No results found for this basename: CRP:2 in the last 72 hours  Microbiology: Recent Results (from the past 240 hour(s))  CULTURE, EXPECTORATED SPUTUM-ASSESSMENT     Status: Normal   Collection Time   03/23/12  3:33 PM      Component Value Range  Status Comment   Specimen Description SPUTUM   Final    Special Requests NONE   Final    Sputum evaluation     Final    Value: THIS SPECIMEN IS ACCEPTABLE. RESPIRATORY CULTURE REPORT TO FOLLOW.   Report Status 03/23/2012 FINAL   Final   CULTURE, RESPIRATORY     Status: Normal   Collection Time   03/23/12  4:05 PM      Component Value Range Status Comment   Specimen Description SPUTUM   Final    Special Requests NONE   Final    Gram Stain     Final    Value: NO WBC SEEN     NO SQUAMOUS EPITHELIAL CELLS SEEN     NO ORGANISMS SEEN   Culture     Final    Value: FEW FUNGUS (MOLD) ISOLATED, PROBABLE CONTAMINANT/COLONIZER (SAPROPHYTE). CONTACT MICROBIOLOGY IF FURTHER IDENTIFICATION REQUIRED [phone].   Report Status 03/26/2012 FINAL   Final   AFB CULTURE WITH SMEAR     Status: Normal (Preliminary result)   Collection Time   03/29/12 10:42 AM      Component Value Range Status Comment   Specimen Description BRONCHIAL ALVEOLAR LAVAGE LEFT   Final    Special Requests Immunocompromised   Final    ACID FAST SMEAR NO ACID FAST BACILLI SEEN   Final    Culture     Final    Value: CULTURE WILL BE EXAMINED FOR 6 WEEKS BEFORE ISSUING A FINAL REPORT   Report Status PENDING   Incomplete   FUNGUS CULTURE W SMEAR     Status: Normal (Preliminary result)   Collection Time   03/29/12 10:42 AM      Component Value Range Status Comment   Specimen Description BRONCHIAL ALVEOLAR LAVAGE LEFT   Final    Special Requests Immunocompromised   Final    Fungal Smear NO YEAST OR FUNGAL ELEMENTS SEEN   Final    Culture YEAST ISOLATED;ID TO FOLLOW   Final    Report Status PENDING   Incomplete   CULTURE, BAL-QUANTITATIVE     Status: Normal   Collection Time   03/29/12 10:42 AM      Component Value Range Status Comment   Specimen Description BRONCHIAL ALVEOLAR LAVAGE LEFT   Final    Special Requests NONE   Final    Gram Stain     Final    Value: FEW WBC PRESENT,BOTH PMN AND MONONUCLEAR     NO ORGANISMS  SEEN    Colony Count 3,000 COLONIES/ML   Final    Culture YEAST CONSISTENT WITH CANDIDA SPECIES   Final    Report Status 04/01/2012 FINAL   Final   FUNGUS CULTURE W SMEAR     Status: Normal (Preliminary result)   Collection Time   03/29/12 11:54 AM      Component Value Range Status Comment   Specimen Description BRONCHIAL ALVEOLAR LAVAGE RIGHT   Final    Special Requests Immunocompromised   Final    Fungal Smear RARE YEAST WITH PSEUDOHYPHAE   Final    Culture YEAST ISOLATED;ID TO FOLLOW   Final    Report Status PENDING   Incomplete   CULTURE, BAL-QUANTITATIVE     Status: Normal   Collection Time   03/29/12 11:54 AM      Component Value Range Status Comment   Specimen Description BRONCHIAL ALVEOLAR LAVAGE RIGHT   Final    Special Requests NONE   Final    Gram Stain     Final    Value: FEW WBC PRESENT,BOTH PMN AND MONONUCLEAR     NO SQUAMOUS EPITHELIAL CELLS SEEN     NO ORGANISMS SEEN   Colony Count 20,OOO COLONIES/ML   Final    Culture YEAST CONSISTENT WITH CANDIDA SPECIES   Final    Report Status 03/31/2012 FINAL   Final   MRSA PCR SCREENING     Status: Normal   Collection Time   03/29/12  3:33 PM      Component Value Range Status Comment   MRSA by PCR NEGATIVE  NEGATIVE Final     Studies/Results: No results found.   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 Hold on therapy.  Will continue to watch off anbx Now on steroids Cont to watch CXR   Total days of antibiotics 6,   Off anbx since 8-14   Johny Sax Infectious Diseases [phone] 04/02/2012, 2:56 PM   LOS: 10 days

## 2012-04-02 NOTE — Progress Notes (Signed)
Initial visit with pt - family at bedside.    Pt family requesting bible and spoke with chaplain about support.  Somewhat frustrated that they had not seen [employer], though pt reports minister was present yesterday.    Informed pt's family of Keith Bell as resource for housing while pt in hospital, as family has been staying in room with pt.  Pt's mother (from [place]) reports that she does not have transportation to get back and forth to housing.

## 2012-04-03 LAB — RENAL FUNCTION PANEL
Albumin: 1.7 g/dL — ABNORMAL LOW (ref 3.5–5.2)
CO2: 22 mEq/L (ref 19–32)
Calcium: 8.6 mg/dL (ref 8.4–10.5)
Chloride: 111 mEq/L (ref 96–112)
GFR calc Af Amer: 26 mL/min — ABNORMAL LOW (ref >90)
GFR calc non Af Amer: 23 mL/min — ABNORMAL LOW (ref >90)
Sodium: 145 mEq/L (ref 135–145)

## 2012-04-03 LAB — CBC WITH DIFFERENTIAL/PLATELET
Basophils Absolute: 0 10*3/uL (ref 0.0–0.1)
Basophils Relative: 0 % (ref 0–1)
Eosinophils Absolute: 0 10*3/uL (ref 0.0–0.7)
Hemoglobin: 8.1 g/dL — ABNORMAL LOW (ref 13.0–17.0)
Lymphocytes Relative: 6 % — ABNORMAL LOW (ref 12–46)
MCHC: 34.3 g/dL (ref 30.0–36.0)
Neutrophils Relative %: 88 % — ABNORMAL HIGH (ref 43–77)
Platelets: 31 10*3/uL — ABNORMAL LOW (ref 150–400)
RBC: 2.92 MIL/uL — ABNORMAL LOW (ref 4.22–5.81)

## 2012-04-03 MED ORDER — ACETAMINOPHEN 325 MG PO TABS
650.0000 mg | ORAL_TABLET | Freq: Once | ORAL | Status: AC
  Administered 2012-04-03: 650 mg via ORAL
  Filled 2012-04-03: qty 2

## 2012-04-03 MED ORDER — FUROSEMIDE 10 MG/ML IJ SOLN
20.0000 mg | Freq: Once | INTRAMUSCULAR | Status: DC
  Filled 2012-04-03: qty 2

## 2012-04-03 MED ORDER — DIPHENHYDRAMINE HCL 25 MG PO CAPS
25.0000 mg | ORAL_CAPSULE | Freq: Once | ORAL | Status: AC
  Administered 2012-04-03: 25 mg via ORAL
  Filled 2012-04-03: qty 1

## 2012-04-03 NOTE — Evaluation (Signed)
Physical Therapy Evaluation Patient Details Name: Keith Bell MRN: [id number] DOB: 1951-02-16 Today's Date: 04/03/2012 Time: 0830-0906 PT Time Calculation (min): 36 min  PT Assessment / Plan / Recommendation Clinical Impression  61 yo male admitted with sepsis. Pt recently discharged from hospital back in July 2013. Lives alone. On eval pt required Min A for mobiltiy/ADLs. Family present and they state that pt coud d/c to family member's home with assistance available. Feel pt would need 24 hour supervision/assist initially if he were to d/c home. If 24 hour not available, recommend  [facility] rehab at [facility].     PT Assessment  Patient needs continued PT services    Follow Up Recommendations  Home health PT;Supervision/Assistance - 24 hour (SNF if 24 hour not available)    Barriers to Discharge        Equipment Recommendations  Rolling walker with 5" wheels    Recommendations for Other Services     Frequency Min 3X/week    Precautions / Restrictions Precautions Precautions: Fall Restrictions Weight Bearing Restrictions: No   Pertinent Vitals/Pain       Mobility  Bed Mobility Bed Mobility: Supine to Sit Supine to Sit: 5: Supervision Details for Bed Mobility Assistance: VCs safety.  Transfers Transfers: Sit to Stand;Stand to [employer] Transfers Sit to Stand: 4: Min assist;From bed Stand to Sit: 4: Min assist;To chair/3-in-1 Stand Pivot Transfers: 4: Min assist Details for Transfer Assistance: VCs safety, technique, hand placement. Assist to rise, stabilzie, control descent, stabililze during pivot, maneuver with RW. Fatigued easily after standing with OT for ADLs Ambulation/Gait Ambulation/Gait Assistance: Not tested (comment) Ambulation/Gait Assistance Details: Pt too fatigued to attempt    Exercises     PT Diagnosis: Difficulty walking;Generalized weakness  PT Problem List: Decreased strength;Decreased activity tolerance;Decreased mobility;Decreased knowledge of use  of DME PT Treatment Interventions: DME instruction;Gait training;Functional mobility training;Therapeutic activities;Therapeutic exercise;Patient/family education   PT Goals Acute Rehab PT Goals PT Goal Formulation: With patient/family Pt will go Supine/Side to Sit: with modified independence PT Goal: Supine/Side to Sit - Progress: Goal set today Pt will go Sit to Supine/Side: with modified independence PT Goal: Sit to Supine/Side - Progress: Goal set today Pt will go Sit to Stand: with supervision PT Goal: Sit to Stand - Progress: Goal set today Pt will Transfer Bed to Chair/Chair to Bed: with supervision PT Transfer Goal: Bed to Chair/Chair to Bed - Progress: Goal set today Pt will Ambulate: 51 - 150 feet;with supervision;with least restrictive assistive device PT Goal: Ambulate - Progress: Goal set today  Visit Information  Last PT Received On: 04/03/12 Assistance Needed: +1 PT/OT Co-Evaluation/Treatment: Yes    Subjective Data  Subjective: "Whatever you girls want..." Patient Stated Goal: Home   Prior Functioning  Home Living Lives With: Alone Available Help at Discharge: Family;Friend(s) Type of Home: Mobile home Home Access: Stairs to enter [employer] of Steps: "a few" Home Layout: One level Home Adaptive Equipment: None Prior Function Level of Independence: Independent Able to Take Stairs?: Yes Driving: No Communication Communication: No difficulties    Cognition  Overall Cognitive Status: Appears within functional limits for tasks assessed/performed Arousal/Alertness: Awake/alert Orientation Level: Appears intact for tasks assessed Behavior During Session: [facility] for tasks performed    Extremity/Trunk Assessment Right Lower Extremity Assessment RLE ROM/Strength/Tone: Deficits RLE ROM/Strength/Tone Deficits: Strength at least 4/5 with functional activity Left Lower Extremity Assessment LLE ROM/Strength/Tone: Deficits LLE ROM/Strength/Tone Deficits:  Strength at least 4/5 with functional activity   Balance    End of  Session PT - End of Session Activity Tolerance: Patient limited by fatigue Patient left: in chair;with call bell/phone within reach;with chair alarm set;with family/visitor present  GP     Rebeca Alert [facility] 04/03/2012, 11:26 AM [phone]

## 2012-04-03 NOTE — Progress Notes (Signed)
INFECTIOUS DISEASE PROGRESS NOTE  ID: Keith Bell is a 61 y.o. male with   Principal Problem:  *Sepsis Active Problems:  Hodgkin's lymphoma in relapse  COPD (chronic obstructive pulmonary disease)  Hyperlipidemia  Hypotension  Acute on chronic renal failure  Iron deficiency anemia  CKD (chronic kidney disease), stage III  Metabolic acidosis  Necrotizing pneumonia  SIRS (systemic inflammatory response syndrome)  HCAP (healthcare-associated pneumonia)  Thrombocytopenia  Hypomagnesemia  Hypokalemia  Hypernatremia  Subjective: Without complaint, some occas cough, has been up in chair today.   Abtx:  Anti-infectives     Start     Dose/Rate Route Frequency Ordered Stop   03/25/12 1100   vancomycin (VANCOCIN) 750 mg in sodium chloride 0.9 % 150 mL IVPB  Status:  Discontinued        750 mg 150 mL/hr over 60 Minutes Intravenous Every 48 hours 03/23/12 1452 03/25/12 0945   03/25/12 1000   vancomycin (VANCOCIN) 750 mg in sodium chloride 0.9 % 150 mL IVPB  Status:  Discontinued        750 mg 150 mL/hr over 60 Minutes Intravenous Every 24 hours 03/25/12 0945 03/26/12 1444   03/23/12 2100   piperacillin-tazobactam (ZOSYN) IVPB 3.375 g  Status:  Discontinued        3.375 g 12.5 mL/hr over 240 Minutes Intravenous Every 8 hours 03/23/12 1452 03/28/12 1533   03/23/12 1115   vancomycin (VANCOCIN) IVPB 1000 mg/200 mL premix     Comments: BEGIN AFTER BLOOD CULTURES DRAWN      1,000 mg 200 mL/hr over 60 Minutes Intravenous  Once 03/23/12 1109 03/23/12 1238   03/23/12 1115  piperacillin-tazobactam (ZOSYN) IVPB 3.375 g    Comments: BEGIN AFTER BLOOD CULTURES DRAWN     3.375 g 12.5 mL/hr over 240 Minutes Intravenous  Once 03/23/12 1109 03/23/12 1641          Medications:  Scheduled:   . sodium chloride   Intravenous Once  . acetaminophen  650 mg Oral Once  . albuterol  2.5 mg Nebulization BID  . antiseptic oral rinse  15 mL Mouth Rinse q12n4p  . benzonatate  200 mg Oral  TID  . chlorhexidine  15 mL Mouth Rinse BID  . diphenhydrAMINE  25 mg Oral Once  . docusate sodium  100 mg Oral BID  . feeding supplement  237 mL Oral BID BM  . furosemide  20 mg Intravenous Once  . guaiFENesin-dextromethorphan  15 mL Oral Q6H  . ipratropium  0.5 mg Nebulization BID  . megestrol  400 mg Oral BID  . multivitamin with minerals  1 tablet Oral Daily  . pantoprazole sodium  40 mg Oral Q1200  . potassium chloride SA  20 mEq Oral Daily  . predniSONE  60 mg Oral Q breakfast  . sodium bicarbonate  1,300 mg Oral TID  . sodium chloride  3 mL Intravenous Q12H    Objective: Vital signs in last 24 hours: Temp:  [97.3 F (36.3 C)-98.7 F (37.1 C)] 98.7 F (37.1 C) (08/20 0646) Pulse Rate:  [102-106] 106  (08/20 0646) Resp:  [20] 20  (08/20 0646) BP: (138-159)/(87-94) 149/87 mmHg (08/20 0646) SpO2:  [90 %-99 %] 90 % (08/20 1150) Weight:  [54.4 kg (119 lb 14.9 oz)] 54.4 kg (119 lb 14.9 oz) (08/20 0646)   General appearance: alert, cooperative, cachectic and no distress Resp: rales bilaterally Chest wall: no tenderness, R upper chest port non-tender, non-fluctuant Cardio: regular rate and rhythm GI: normal findings: bowel  sounds normal and soft, non-tender  Lab Results  Basename 04/03/12 0540 04/02/12 0535 04/02/12 0500  WBC 14.3* 12.5* --  HGB 8.1* 9.0* --  HCT 23.6* 25.7* --  NA 145 -- 144  K 4.3 -- 3.2*  CL 111 -- 110  CO2 22 -- 21  BUN 55* -- 63*  CREATININE 2.82* -- 3.22*  GLU -- -- --   Liver Panel  Basename 04/03/12 0540 04/02/12 0500  PROT -- --  ALBUMIN 1.7* 1.6*  AST -- --  ALT -- --  ALKPHOS -- --  BILITOT -- --  BILIDIR -- --  IBILI -- --   Sedimentation Rate No results found for this basename: ESRSEDRATE in the last 72 hours C-Reactive Protein No results found for this basename: CRP:2 in the last 72 hours  Microbiology: Recent Results (from the past 240 hour(s))  AFB CULTURE WITH SMEAR     Status: Normal (Preliminary result)    Collection Time   03/29/12 10:42 AM      Component Value Range Status Comment   Specimen Description BRONCHIAL ALVEOLAR LAVAGE LEFT   Final    Special Requests Immunocompromised   Final    ACID FAST SMEAR NO ACID FAST BACILLI SEEN   Final    Culture     Final    Value: CULTURE WILL BE EXAMINED FOR 6 WEEKS BEFORE ISSUING A FINAL REPORT   Report Status PENDING   Incomplete   FUNGUS CULTURE W SMEAR     Status: Normal (Preliminary result)   Collection Time   03/29/12 10:42 AM      Component Value Range Status Comment   Specimen Description BRONCHIAL ALVEOLAR LAVAGE LEFT   Final    Special Requests Immunocompromised   Final    Fungal Smear NO YEAST OR FUNGAL ELEMENTS SEEN   Final    Culture YEAST ISOLATED;ID TO FOLLOW   Final    Report Status PENDING   Incomplete   CULTURE, BAL-QUANTITATIVE     Status: Normal   Collection Time   03/29/12 10:42 AM      Component Value Range Status Comment   Specimen Description BRONCHIAL ALVEOLAR LAVAGE LEFT   Final    Special Requests NONE   Final    Gram Stain     Final    Value: FEW WBC PRESENT,BOTH PMN AND MONONUCLEAR     NO ORGANISMS SEEN   Colony Count 3,000 COLONIES/ML   Final    Culture YEAST CONSISTENT WITH CANDIDA SPECIES   Final    Report Status 04/01/2012 FINAL   Final   FUNGUS CULTURE W SMEAR     Status: Normal (Preliminary result)   Collection Time   03/29/12 11:54 AM      Component Value Range Status Comment   Specimen Description BRONCHIAL ALVEOLAR LAVAGE RIGHT   Final    Special Requests Immunocompromised   Final    Fungal Smear RARE YEAST WITH PSEUDOHYPHAE   Final    Culture YEAST ISOLATED;ID TO FOLLOW   Final    Report Status PENDING   Incomplete   CULTURE, BAL-QUANTITATIVE     Status: Normal   Collection Time   03/29/12 11:54 AM      Component Value Range Status Comment   Specimen Description BRONCHIAL ALVEOLAR LAVAGE RIGHT   Final    Special Requests NONE   Final    Gram Stain     Final    Value: FEW WBC PRESENT,BOTH PMN AND  MONONUCLEAR  NO SQUAMOUS EPITHELIAL CELLS SEEN     NO ORGANISMS SEEN   Colony Count 20,OOO COLONIES/ML   Final    Culture YEAST CONSISTENT WITH CANDIDA SPECIES   Final    Report Status 03/31/2012 FINAL   Final   MRSA PCR SCREENING     Status: Normal   Collection Time   03/29/12  3:33 PM      Component Value Range Status Comment   MRSA by PCR NEGATIVE  NEGATIVE Final     Studies/Results: No results found.   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 Please call if questions, thanks   Johny Sax Infectious Diseases [phone] 04/03/2012, 2:40 PM   LOS: 11 days

## 2012-04-03 NOTE — Evaluation (Signed)
Occupational Therapy Evaluation Patient Details Name: Keith Bell MRN: [id number] DOB: 1951-04-15 Today's Date: 04/03/2012 Time: 4782-9562 OT Time Calculation (min): 36 min  OT Assessment / Plan / Recommendation Clinical Impression  61 yo male admitted with sepsis. Pt recently discharged from hospital back in July 2013. Lives alone. On eval pt required Min A for mobiltiy/ADLs. Family present and they state that pt coud d/c to family member's home with assistance available. Feel pt would need 24 hour supervision/assist initially if he were to d/c home. If 24 hour not available, recommend  ST rehab at [facility]. Will benefit from skilled OT services to imrpove activity tolerance and independence with ADL.     OT Assessment  Patient needs continued OT Services    Follow Up Recommendations  Home health OT;Supervision/Assistance - 24 hour; If 24/7 not available, then will need Skilled nursing facility;Other (comment) (depending on if 24/7 assist available. )    Barriers to Discharge      Equipment Recommendations  Rolling walker with 5" wheels;3 in 1 bedside comode;Other (comment) (will further assess tub DME.)    Recommendations for Other Services    Frequency  Min 2X/week    Precautions / Restrictions Precautions Precautions: Fall Restrictions Weight Bearing Restrictions: No        ADL  Eating/Feeding: Simulated;Set up Where Assessed - Eating/Feeding: Bed level Grooming: Simulated;Wash/dry face;Set up;Supervision/safety Where Assessed - Grooming: Unsupported sitting Upper Body Bathing: Performed;Chest;Right arm;Left arm;Abdomen;Supervision/safety;Set up Where Assessed - Upper Body Bathing: Unsupported sitting Lower Body Bathing: Simulated;Minimal assistance Where Assessed - Lower Body Bathing: Supported sit to stand Upper Body Dressing: Simulated;Minimal assistance Where Assessed - Upper Body Dressing: Unsupported sitting Lower Body Dressing: Simulated;Minimal assistance Where  Assessed - Lower Body Dressing: Supported sit to stand Toilet Transfer: Simulated;Minimal assistance Toilet Transfer Method: Stand pivot Toileting - Clothing Manipulation and Hygiene: Simulated;Minimal assistance Where Assessed - Engineer, mining and Hygiene: Standing Tub/Shower Transfer Method: Not assessed Equipment Used: Rolling walker ADL Comments: pt required increased encouragement intitially to participate but did agree to sit EOB and wash face. He proceeded with rest of bath as his bed was soaked with urine and gown wet. Then agreed to get up to chair.     OT Diagnosis: Generalized weakness  OT Problem List: Decreased strength;Decreased activity tolerance;Decreased knowledge of use of DME or AE OT Treatment Interventions: Self-care/ADL training;Therapeutic activities;DME and/or AE instruction;Patient/family education   OT Goals Acute Rehab OT Goals OT Goal Formulation: With patient Time For Goal Achievement: 04/17/12 Potential to Achieve Goals: Good ADL Goals Pt Will Perform Grooming: with supervision;Other (comment);Standing at sink (2 tasks) ADL Goal: Grooming - Progress: Goal set today Pt Will Perform Lower Body Bathing: with supervision;Sit to stand from chair;Sit to stand from bed ADL Goal: Lower Body Bathing - Progress: Goal set today Pt Will Perform Lower Body Dressing: with supervision;Sit to stand from chair;Sit to stand from bed ADL Goal: Lower Body Dressing - Progress: Goal set today Pt Will Transfer to Toilet: with supervision;Ambulation;with DME;3-in-1 ADL Goal: Toilet Transfer - Progress: Goal set today Pt Will Perform Toileting - Clothing Manipulation: with supervision;Standing ADL Goal: Toileting - Clothing Manipulation - Progress: Goal set today Pt Will Perform Tub/Shower Transfer: with min assist;Tub transfer;with DME ADL Goal: Tub/Shower Transfer - Progress: Goal set today  Visit Information  Last OT Received On: 04/03/12 Assistance Needed:  +1 PT/OT Co-Evaluation/Treatment: Yes    Subjective Data  Subjective: i want to go out of here Patient Stated Goal: pt with encouragement agreeable to  wash face and get up to chair   Prior Functioning  Vision/Perception  Home Living Lives With: Alone Available Help at Discharge: Family;Friend(s) Type of Home: Mobile home Home Access: Stairs to enter [employer] of Steps: "a few" Home Layout: One level Bathroom Shower/Tub: Engineer, manufacturing systems: Standard Home Adaptive Equipment: None Prior Function Level of Independence: Independent Able to Take Stairs?: Yes Driving: No Communication Communication: No difficulties      Cognition  Overall Cognitive Status: Appears within functional limits for tasks assessed/performed Arousal/Alertness: Awake/alert Orientation Level: Appears intact for tasks assessed Behavior During Session: [facility] for tasks performed    Extremity/Trunk Assessment Right Upper Extremity Assessment RUE ROM/Strength/Tone: [facility] for tasks assessed Left Upper Extremity Assessment LUE ROM/Strength/Tone: WFL for tasks assessed Right Lower Extremity Assessment RLE ROM/Strength/Tone: Deficits RLE ROM/Strength/Tone Deficits: Strength at least 4/5 with functional activity Left Lower Extremity Assessment LLE ROM/Strength/Tone: Deficits LLE ROM/Strength/Tone Deficits: Strength at least 4/5 with functional activity   Mobility Bed Mobility Bed Mobility: Supine to Sit Supine to Sit: 5: Supervision Details for Bed Mobility Assistance: VCs safety.  Transfers Sit to Stand: 4: Min assist;From bed Stand to Sit: 4: Min assist;To chair/3-in-1 Details for Transfer Assistance: VCs safety, technique, hand placement. Assist to rise, stabilzie, control descent, stabililze during pivot, maneuver with RW. Fatigued easily after standing with OT for ADLs   Exercise    Balance Balance Balance Assessed: Yes Dynamic Standing Balance Dynamic Standing - Level of  Assistance: 4: Min assist  End of Session OT - End of Session Activity Tolerance: Patient limited by fatigue Patient left: in chair;with call bell/phone within reach;with chair alarm set  GO     Lennox Laity [phone] 04/03/2012, 11:51 AM

## 2012-04-03 NOTE — Progress Notes (Addendum)
Name: Keith Bell MRN: [id number] DOB: 1951-02-14    LOS: 11  Referring Provider: [facility] Reason for Referral:  Pna/ shock  PULMONARY / CRITICAL CARE MEDICINE  HPI: 61 yo male former smoker with hx of Hodgkin's disease with persistent Rt upper lobe infiltrate.  Cultures: Sputum 8/9>>?few fungus MRSA 8/9 > neg BC x 2 8/9 >>>neg Urine strep 8/9 > neg Urine Legionella 8/9 > neg  Antibiotics: Zosyn 8/9 >>>off Vanc 8/9 >>>8/12  Current Status: Wants to home. Increased cough during the night  Vital Signs: Temp:  [97.3 F (36.3 C)-98.7 F (37.1 C)] 98.7 F (37.1 C) (08/20 0646) Pulse Rate:  [102-106] 106  (08/20 0646) Resp:  [20] 20  (08/20 0646) BP: (138-159)/(87-94) 149/87 mmHg (08/20 0646) SpO2:  [91 %-99 %] 95 % (08/20 0646) Weight:  [119 lb 14.9 oz (54.4 kg)] 119 lb 14.9 oz (54.4 kg) (08/20 0646) FIO2: 2 lpm  Physical Examination: General - no distress HEENT - no sinus tenderness Cardiac - s1s2 regular Chest - scattered rhonchi, rt porta cath, congested cough,scant hempotysis Abd - soft, non tender Ext - no edema Neuro - normal strength Skin - no rashes  No results found.     Lab 04/03/12 0540 04/02/12 0500 04/01/12 0408  NA 145 144 146*  K 4.3 3.2* 4.0  CL 111 110 113*  CO2 22 21 18*  BUN 55* 63* 56*  CREATININE 2.82* 3.22* 3.36*  GLUCOSE 138* 164* 131*    Lab 04/03/12 0540 04/02/12 0535 04/01/12 0404  HGB 8.1* 9.0* 9.3*  HCT 23.6* 25.7* 26.5*  WBC 14.3* 12.5* 15.8*  PLT 31* 31* 49*     Lab Results  Component Value Date   ESRSEDRATE 118* 03/26/2012     ASSESSMENT AND PLAN  8/10 CT chest>>Rt upper lobe consolidation, centrilobular emphysema, new ASD lingula, mild ASD RLL 8/15 FOB > No endobronchial lesions. BAL from Lt lingula>>60 ml saline in with 10 ml of thick, yellow to red secretions.   BAL from Rt upper lobe>>120 ml saline in with 20 ml of thick, yellow to red secretions. Then brushing from Rt upper lobe done  A: Persistent  Rt upper lung ASD first noted 01/17/12>>?infection vs inflammatory vs malignancy. S/p FOB 8/15 Fungal 8/15>>yeast BAL 8/15>>>yeast Fluid ct: wbc 2150 neut 94  P:   Continue pulm hygiene Hold off on Abx pending culture results  Rx  empiric trial of prednisone 8/16    A: Former smoker with wheezing and ?of COPD. P: Add schedule bronchodilators Should get benefit from prednisone for this also  A: Thrombocytopenia. Lab Results  Component Value Date   PLT 31* 04/03/2012   PLT 31* 04/02/2012   PLT 49* 04/01/2012   PLT 101* 03/16/2012   PLT 163 03/02/2012   PLT 122* 02/01/2012    P:  transfused PLT prior to bronchoscopy    A: Stage IV Hodgkin's disease. P: Oncology following  A: ?HCAP.  ?fungus/mold in sputum from 8/9.  P: Abx per ID  A: Acute renal insufficiency with hx of Stage III CKD.  Non-gap metabolic acidosis. 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   Lab 04/03/12 0540 04/02/12 0500 04/01/12 0408  CREATININE 2.82* 3.22* 3.36*  CO2 22 21 18*  K 4.3 3.2* 4.0  P: Per primary team      Brett Canales Minor ACNP Adolph Pollack PCCM Pager [phone] till 3 pm If no answer page [phone] 04/03/2012, 9:32 AM  Pt independently  seen and examined  and available cxr's reviewed and I agree with above findings/ imp/ plan  Since shifting to palliative care pccm service can see prn specific questions   Sandrea Hughs, MD Pulmonary and Critical Care Medicine [facility] Cell [phone]

## 2012-04-03 NOTE — Progress Notes (Signed)
[facility] PROGRESS NOTE  Keith Bell UJW:[id number] DOB: 1951/05/22 DOA: 03/23/2012 PCP: Eino Farber, MD  Assessment/Plan: Principal Problem:  *Sepsis Active Problems:  Hodgkin's lymphoma in relapse  COPD (chronic obstructive pulmonary disease)  Hyperlipidemia  Hypotension  Acute on chronic renal failure  Iron deficiency anemia  CKD (chronic kidney disease), stage III  Metabolic acidosis  Necrotizing pneumonia  SIRS (systemic inflammatory response syndrome)  HCAP (healthcare-associated pneumonia)  Thrombocytopenia  Hypomagnesemia  Hypokalemia  Hypernatremia  #1 hypotension Resolved. Likely secondary to sepsis secondary to recurrent probable necrotizing pneumonia/healthcare associated pneumonia. Systolic blood pressure improved.  Pro calcitonin was elevated at 13 on admission decreasing.  Sputum Gram stain and cultures with fungus ?? Contaminant.. Blood cultures neg x 2. Urine Legionella is negative. Urine strep pneumococcus is negative.  Vancomycin d/c'd per ID. Zosyn d/c'd per ID. Patient s/p broncoscopy with cultures pending. PCCM following and appreciate input and recommendations. Continue supportive care.  #2 recurrent necrotizing pneumonia/healthcare associated pneumonia/?inflammatory vs fungal vs noninfectiuos vs BOOP This is patient's third hospitalization for right upper lobe pneumonia first episode in June of 2013 second episode in July of 2013 with patient was diagnosed with a necrotizing pneumonia per pulmonary medicine. Patient was discharged home on Levaquin and augmentin for 3 weeks, however concern as to whether patient took full course of antibiotics. Patient is presenting with productive cough, chills, fever, chest x-ray consistent with worsening pneumonia. Sputum Gram stain and cultures are pending. Urine strep pneumococcus is negative. Blood cx neg x 2. A urine Legionella is negative. Pro calcitonin levels were elevated at 13 on admission and now at  5.61 and trending down. Lactic acid level was 2.1 on admission and trending down. Will repeat pro calcitonin in the a.m. Sputum with some fungus however likely contaminant.Repeat CT of the chest with continued progression of severe consolidated pneumonia in the right upper lobe superimposed on severe centrilobular emphysema and also do a space disease in the lingula with a peripheral nodular component suggestive of a retinal same process in the right upper lobe, mild airspace disease in the right lower lobe. Pulmonary critical care is following and patient s/p bronchoscopy.  IV Zosyn d/c'd. Vancomycin d/c'd per ID. Nebs as needed. Mucinex. Tessalon Perles.  CXR worsening. Pulmonary and critical care following. Patient is status post bronchoscopy which had the turbid-appearing fluid with elevated WBCs and elevated neutrophil count. Some growth of fungus in BAL. Cultures are pending. Antibiotics discontinued for now. We'll await cultures. Patient started on steroids. Pulmonary following and appreciate input and recommendations. ID ff and appreciate input and rxcs.   #3 sepsis Likely secondary to problem #2. Urinalysis is negative. Chest x-ray consistent with recurrent worsening pneumonia. CT of the chest with multilobar and pneumonia and progression of worsening recurrent right upper lobe pneumonia .Blood cultures are pending. Pro calcitonin level was elevated on admission at 13 and trending down. Lactic acid level was 2.1 and trending down. Urine strep pneumococcus is negative. Urine Legionella is negative. Sputum with some fungus however likely contaminant.  IV Zosyn d/c'd per ID. Vancomycin d/c'd per ID. patient is status post bronchoscopy with cultures pending however fluid noted to be brown/purulent with increased white blood cells turbid in nature with an elevated neutrophil count. Growth of some fungus in BAL. All antibiotics have been discontinued at this time per ID. patient be started on oral prednisone  per pulmonary. Awaiting culture results prior to resuming antibiotics if needed per pulmonary. Pulmonary and critical care medicine ff. ID ff pt  s/p bronchoscopy.  #4 acute on chronic kidney disease stage III Patient's baseline creatinine is approximately 1.4-1.7. Creatinine on admission was 2.8. Likely secondary to a prerenal azotemia initially. 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 Patient is currently euvolemic. Renal function worsening with unknown etiology.?? Sec to chemo.. Creatinine turning back down and now at 2.82 from 3.22 from 3.36 from 3.03 from 2.83 from 2.52 from 2.32 from 2.25 from 2.21 and trending back up. Patient was given a dose of Lasix orally prior to starting steroids per pulmonary.. Renal ultrasound normal. Continue supportive care follow. Continue bicarbonate tablets. Follow.  #5 thrombocytopenia/Anemia Likely secondary to problem #3 and afinitor versus consumptive state.. Patient is status post 3 unit of packed red blood cells. Afinitor has been discontinued. Hgb is now 9.0 from 9.3 from 11.3 from 10.5 from 9.7 from 7.6. LDH WNL.  Haptoglobin elevated at 409. Peripheral smear negative for schistocytes, however with elevated d dimer and fibrinogen. ???DIC. Platelets trending down and at 16 yesterday. Patient was transfused one bag of platelets. Platelet count went up to 80 and is back down to 49 yesterday and 31 today. Patient denies any active bleeding. Repeat DIC panel with elevated d-dimer and fibrinogen levels. No schistocytes. Question as to whether patient may have some, consumptive coagulopathy. Hemoglobin is also trending down. Follow. Hematology ff. S/P vit K as PTT AND PT elevated. PTT/PT/INR IMPROVED.  Discussed with Dr Arline Asp of heme/onc on friday. Patient has also been started on steroids per pulmonary. Monitor for now. Hematology following and appreciate input and recommendations. Will  transfuse 1 unit PRBCs. . Follow. Transfuse platelets if spontaneously bleeding or platelet count less than 20,000  #6 COPD Stable. Nebs when necessary. Patient started on steroids secondary to problem #1.  #7 metabolic acidosis Likely secondary to sepsis versus acute on chronic kidney disease stage III. Improved with HCO3 in IVF initially. D/C'd HCO3 5 days ago and started oral bicarbonate. Oral bicarbonate dose increased. Follow. See problem #3.  #8 hypomagnesemia/hypokalemia Replete potassium.  #9 hypernatremia Improved with change of IV fluids.   #10 constipation Resolved with MiraLAX. Colace 100 mg twice a day.   #11 Hodgkin's lymphoma, relapse Afinitor has been discontinued secondary to his anemia, thrombocytopenia, sepsis. Hematology oncology is following.  #12 prophylaxis Pepcid for GI prophylaxis. SCDs for DVT prophylaxis.  #13 prognosis Patient has been doing poorly during this hospitalization and has been on a gradual decline during this past year. Patient has had multiple admissions over the past 3 months for this pulmonary issue with unknown etiology. Patient has had acute on chronic kidney disease which was felt per renal to be likely secondary to his Hodgkin's lymphoma and his renal function had worsened and his creatinine has been as high as 3.36 however today is trending back down and is now 2.82. Patient has had a BAL and currently on steroids. Patient has had a anemia and they thrombocytopenia with platelets dropping as low as 16 and has received 2 bags of platelets during this hospitalization. Patient has also been transfused packed red blood cells during this hospitalization. Over the past 2 days patient has expressed a desire to die stating that he was to be 6 feet under today. We'll consult with palliative care consult for goals of care. Oncology will be back tomorrow. Will follow.  #14: Code Status Discussed CODE STATUS with patient after reviewing Dr. Mamie Levers  note from 03/17/2011. Patient is in agreement with a Limited  code. No CPR. No mechanical ventilation. May use pressors or antiarrhythmics.   Code Status: Limited code. Family Communication: Updated patient and daughter and mother at bedside. Disposition Plan: Home when medically stable vs SNF.   Brief narrative: Very pleasant 61 yo with hx copd, hodgkins lymphoma, necrotizing pna presents to ED cc sob and cough. Information provided by painted. States symptoms began 3 days ago have persisted and worsened. Associated symptoms include chills, subjective fever, pleuritic chest pain and sinus congestion as well as some nausea and 3 episodes of vomiting. Reports emesis clear without dk coffee ground substances. Denies dysuria/hematuria abdominal pain or diarrhea. In ED pt was tachycardic, hypotensive and chest xray concerning for pna. Of note pt was discharged from hospital 7/26 after 5 day stay for similar symptoms. Was discharge on levaquin to be taken for 3 week. [facility] asked to admit      Consultants:  Pulmonary: Dr Wert/Dr. Craige Cotta 03/24/12   Hematolgy/Oncology: Dr Darrold Span 03/24/12  Renal : Dr. Arlean Hopping 03/28/12  Procedures:  CXR  03/23/12  Status post 1 unit of packed red blood cells on 03/23/2012  V/Q scan 03/24/12  CT Chest 03/24/12  Renal US 03/24/12  2 units PRBC  03/25/12  Platelets 1 bag 03/29/12  Bronchial alveolar lavage 03/29/2012  Platelets 1 bag  8 /17/ 2013  Antibiotics:  IV Vancomycin 03/23/12---> 03/26/12  IV Zosyn 03/23/12--->03/28/12  HPI/Subjective: Patient in bed. Patient states had a little bit of a nosebleed last night none since. Patient states he was to be 6 foot under and wants to go home. Family at bedside. Patient with cough. Patient states breathing ok.  Objective: Filed Vitals:   04/02/12 1455 04/02/12 1804 04/02/12 2246 04/03/12 0646  BP: 138/94  159/87 149/87  Pulse: 102  105 106  Temp: 97.3 F (36.3 C)  98.4 F (36.9 C) 98.7 F (37.1 C)  TempSrc:  Oral  Oral Oral  Resp: 20  20 20   Height:      Weight:    54.4 kg (119 lb 14.9 oz)  SpO2: 99% 96% 91% 95%    Intake/Output Summary (Last 24 hours) at 04/03/12 1132 Last data filed at 04/03/12 0744  Gross per 24 hour  Intake    669 ml  Output   1000 ml  Net   -331 ml    Exam: General: Cachectic. In bed. Coughing. HEENT: No bruits, no goiter. Heart: Tachycardia, without murmurs, rubs, gallops. Lungs:  Coarse BS. Abdomen: Soft, nontender, nondistended, positive bowel sounds. Extremities: No clubbing cyanosis. 1-2 + BLE edema with positive pedal pulses. Neuro: Grossly intact, nonfocal.  Data Reviewed: Basic Metabolic Panel:  Lab 04/03/12 4098 04/02/12 0500 04/01/12 0408 03/31/12 0523 03/30/12 0345 03/28/12 0410  NA 145 144 146* 145 145 --  K 4.3 3.2* 4.0 4.2 3.8 --  CL 111 110 113* 113* 113* --  CO2 22 21 18* 17* 16* --  GLUCOSE 138* 164* 131* 188* 50* --  BUN 55* 63* 56* 46* 36* --  CREATININE 2.82* 3.22* 3.36* 3.03* 2.83* --  CALCIUM 8.6 8.1* 8.2* 8.5 8.0* --  MG -- -- -- -- -- 2.2  PHOS 3.1 4.1 4.8* 5.1* 4.9* --   Liver Function Tests:  Lab 04/03/12 0540 04/02/12 0500 04/01/12 0408 03/31/12 0523 03/30/12 0345  AST -- -- -- 8 --  ALT -- -- -- 5 --  ALKPHOS -- -- -- 330* --  BILITOT -- -- -- 0.2* --  PROT -- -- -- 4.9* --  ALBUMIN 1.7* 1.6*  1.5* 1.3* 1.3*   No results found for this basename: LIPASE:5,AMYLASE:5 in the last 168 hours No results found for this basename: AMMONIA:5 in the last 168 hours CBC:  Lab 04/03/12 0540 04/02/12 0535 04/01/12 0404 03/31/12 1438 03/31/12 0524 03/31/12 0523 03/30/12 0345 03/29/12 0510  WBC 14.3* 12.5* 15.8* 19.8* -- 18.1* -- --  NEUTROABS 12.5* 11.3* 14.4* -- -- -- 9.6* 10.4*  HGB 8.1* 9.0* 9.3* 11.2* -- 11.3* -- --  HCT 23.6* 25.7* 26.5* 32.4* -- 32.7* -- --  MCV 80.8 80.6 81.0 82.4 -- 82.0 -- --  PLT 31* 31* 49* 80* 16* -- -- --   Cardiac Enzymes: No results found for this basename:  CKTOTAL:5,CKMB:5,CKMBINDEX:5,TROPONINI:5 in the last 168 hours BNP (last 3 results)  Basename 03/24/12 1149 03/05/12 2100 02/22/12 1600  PROBNP 1538.0* 479.3* 1118.0*   CBG: No results found for this basename: GLUCAP:5 in the last 168 hours  Recent Results (from the past 240 hour(s))  AFB CULTURE WITH SMEAR     Status: Normal (Preliminary result)   Collection Time   03/29/12 10:42 AM      Component Value Range Status Comment   Specimen Description BRONCHIAL ALVEOLAR LAVAGE LEFT   Final    Special Requests Immunocompromised   Final    ACID FAST SMEAR NO ACID FAST BACILLI SEEN   Final    Culture     Final    Value: CULTURE WILL BE EXAMINED FOR 6 WEEKS BEFORE ISSUING A FINAL REPORT   Report Status PENDING   Incomplete   FUNGUS CULTURE W SMEAR     Status: Normal (Preliminary result)   Collection Time   03/29/12 10:42 AM      Component Value Range Status Comment   Specimen Description BRONCHIAL ALVEOLAR LAVAGE LEFT   Final    Special Requests Immunocompromised   Final    Fungal Smear NO YEAST OR FUNGAL ELEMENTS SEEN   Final    Culture YEAST ISOLATED;ID TO FOLLOW   Final    Report Status PENDING   Incomplete   CULTURE, BAL-QUANTITATIVE     Status: Normal   Collection Time   03/29/12 10:42 AM      Component Value Range Status Comment   Specimen Description BRONCHIAL ALVEOLAR LAVAGE LEFT   Final    Special Requests NONE   Final    Gram Stain     Final    Value: FEW WBC PRESENT,BOTH PMN AND MONONUCLEAR     NO ORGANISMS SEEN   Colony Count 3,000 COLONIES/ML   Final    Culture YEAST CONSISTENT WITH CANDIDA SPECIES   Final    Report Status 04/01/2012 FINAL   Final   FUNGUS CULTURE W SMEAR     Status: Normal (Preliminary result)   Collection Time   03/29/12 11:54 AM      Component Value Range Status Comment   Specimen Description BRONCHIAL ALVEOLAR LAVAGE RIGHT   Final    Special Requests Immunocompromised   Final    Fungal Smear RARE YEAST WITH PSEUDOHYPHAE   Final    Culture YEAST  ISOLATED;ID TO FOLLOW   Final    Report Status PENDING   Incomplete   CULTURE, BAL-QUANTITATIVE     Status: Normal   Collection Time   03/29/12 11:54 AM      Component Value Range Status Comment   Specimen Description BRONCHIAL ALVEOLAR LAVAGE RIGHT   Final    Special Requests NONE   Final    Gram Stain  Final    Value: FEW WBC PRESENT,BOTH PMN AND MONONUCLEAR     NO SQUAMOUS EPITHELIAL CELLS SEEN     NO ORGANISMS SEEN   Colony Count 20,OOO COLONIES/ML   Final    Culture YEAST CONSISTENT WITH CANDIDA SPECIES   Final    Report Status 03/31/2012 FINAL   Final   MRSA PCR SCREENING     Status: Normal   Collection Time   03/29/12  3:33 PM      Component Value Range Status Comment   MRSA by PCR NEGATIVE  NEGATIVE Final      Studies: Dg Chest 2 View  03/23/2012  *RADIOLOGY REPORT*  Clinical Data: Cough.  Pleurisy.  Short of breath.  History of cancer.  CHEST - 2 VIEW  Comparison: 03/05/2012.  Findings: Progressive airspace disease is present in the right upper lobe, best seen on the lateral view.  When comparing lateral views to prior chest radiograph 03/05/2012, there is continued/progressive consolidation of the right upper lobe.  Support apparatus appears unchanged.  There is some faint airspace disease in the left midlung as well.  Cardiopericardial silhouette unchanged.  Right IJ power port appears similar.  IMPRESSION: Progressive consolidation of the right upper lobe, best seen on the lateral view.  Faint airspace opacity in the left midlung, which may also represent pneumonia.  Original Report Authenticated By: Andreas Newport, M.D.   Dg Chest 2 View  03/05/2012  *RADIOLOGY REPORT*  Clinical Data: Respiratory distress.  CHEST - 2 VIEW  Comparison: 02/27/2012.  Findings: The power port is stable.  The right upper lobe airspace process shows a slight decrease and consolidation.  The remainder the lungs are clear.  IMPRESSION: Persistent but slight improved right upper lobe pneumonia.   Original Report Authenticated By: P. Loralie Champagne, M.D.   Dg Chest 2 View  02/27/2012  *RADIOLOGY REPORT*  Clinical Data: Cough, congestion.  CHEST - 2 VIEW  Comparison: 02/22/2012  Findings: Right Port-A-Cath remains in place, unchanged.  Dense consolidation in the right upper lobe is stable.  Left lung is clear.  Heart is normal size.  No effusions.  IMPRESSION: Stable dense right upper lobe consolidation.  Original Report Authenticated By: Cyndie Chime, M.D.   Ct Chest Wo Contrast  02/23/2012  *RADIOLOGY REPORT*  Clinical Data: Right lower lobe infiltrate.  Non Hodgkin lymphoma. Shortness of breath.  CT CHEST WITHOUT CONTRAST  Technique:  Multidetector CT imaging of the chest was performed following the standard protocol without IV contrast.  Comparison: PET 12/21/2011. CT chest 12/18/2008.  Findings: No pathologically enlarged mediastinal or axillary lymph nodes.  The hilar regions are difficult to definitively evaluate without IV contrast.  Coronary artery calcification.  Heart size normal.  No pericardial effusion.  Emphysema.  Airspace consolidation with internal rounded lucencies involving the majority of the anterior and posterior segment of the right upper lobe.  Associated air bronchograms.  Additional consolidation is seen in the medial aspect of the right lower lobe, with at least one air-fluid level (image 30), possibly with a previous site of bullous disease, as seen on 12/18/2008. Additional scattered peribronchovascular nodularity and mild consolidation in the right lower lobe, inferiorly.  7 mm subpleural left upper lobe nodule (image 36) is unchanged from 12/18/2008, indicating a benign etiology.  No pleural fluid.  Airway is unremarkable.  Incidental imaging of the upper abdomen shows no acute findings. No worrisome lytic or sclerotic lesions.  IMPRESSION:  Right upper and right lower lobe pneumonia. Associated fluid in multiple  bullous lesions in the right upper and right lower lobes.   Original Report Authenticated By: Reyes Ivan, M.D.   Dg Esophagus  03/08/2012  *RADIOLOGY REPORT*  Clinical Data: Dysphagia.  ESOPHOGRAM / BARIUM SWALLOW  Technique:  Combined double contrast and single contrast examination performed using effervescent crystals, thick barium liquid, and thin barium liquid.  Fluoroscopy time:  1.2 minutes.  Comparison: 02/23/2012  Findings:  The cervical and thoracic esophagus are patent.  No high- grade stricture or mass.  Normal motility of the esophagus.  The patient ingested a 13 mm barium tablet which easily passed through the esophagus and into the stomach.  No reflux identified.  IMPRESSION:  1.  Normal esophagram.  Original Report Authenticated By: Rosealee Albee, M.D.   Dg Abd 2 Views  02/26/2012  *RADIOLOGY REPORT*  Clinical Data: Nausea and vomiting.  Hodgkin's lymphoma.  COPD.  ABDOMEN - 2 VIEW  Comparison: PET of 12/21/2011.  Findings: Upright view abdomen and a supine view of the abdomen and pelvis.  Upright view abdomen demonstrates no free intraperitoneal air or significant air fluid levels.  Interstitial thickening at the lung bases without lobar consolidation.  Supine view of the abdomen pelvis demonstrates a large colonic stool burden.  Distal gas.  No bowel distention.  Minimal convex right lumbar spine curvature.  IMPRESSION: Colonic stool burden suggests constipation.  No acute findings.  Original Report Authenticated By: Consuello Bossier, M.D.    Scheduled Meds:    . sodium chloride   Intravenous Once  . albuterol  2.5 mg Nebulization BID  . antiseptic oral rinse  15 mL Mouth Rinse q12n4p  . benzonatate  200 mg Oral TID  . chlorhexidine  15 mL Mouth Rinse BID  . docusate sodium  100 mg Oral BID  . feeding supplement  237 mL Oral BID BM  . guaiFENesin-dextromethorphan  15 mL Oral Q6H  . ipratropium  0.5 mg Nebulization BID  . megestrol  400 mg Oral BID  . multivitamin with minerals  1 tablet Oral Daily  . pantoprazole sodium  40 mg  Oral Q1200  . potassium chloride SA  20 mEq Oral Daily  . potassium chloride  40 mEq Oral Q4H  . predniSONE  60 mg Oral Q breakfast  . sodium bicarbonate  1,300 mg Oral TID  . sodium chloride  3 mL Intravenous Q12H   Continuous Infusions:    . dextrose 20 mL/hr at 04/02/12 0600    Principal Problem:  *Sepsis Active Problems:  Hodgkin's lymphoma in relapse  COPD (chronic obstructive pulmonary disease)  Hyperlipidemia  Hypotension  Acute on chronic renal failure  Iron deficiency anemia  CKD (chronic kidney disease), stage III  Metabolic acidosis  Necrotizing pneumonia  SIRS (systemic inflammatory response syndrome)  HCAP (healthcare-associated pneumonia)  Thrombocytopenia  Hypomagnesemia  Hypokalemia  Hypernatremia    Time spent: 50 mins    [facility]  [facility] Pager [phone]. If 8PM-8AM, please contact night-coverage at [email or web address], password [facility] 04/03/2012, 11:32 AM  LOS: 11 days

## 2012-04-03 NOTE — Progress Notes (Signed)
Patient had a small amount of bloody sputum and small amount of blood trickled down his nose, Maren Reamer NP was made aware. Will continue to monitor patient.

## 2012-04-03 NOTE — Progress Notes (Signed)
Consult received. Patient screened for needs-patient has several family members who are involved in his care, he has full capacity on his own to make medical decisions. He wants is significant other Patty Raley to be involved in his decision making, I attempted to call Patty, but could not confirm voicemail, patient says that he will ask family members to be present for GOC meeting which is scheduled for 8/21 at 12 noon. His mother is at bedside- she and other family members are from [place], I offered phone conference, but patient declined. His primary goal is to "go home". I reassured him that we discuss all possible options for his care at scheduled goals meeting.  Anderson Malta, DO Palliative Medicine

## 2012-04-04 ENCOUNTER — Inpatient Hospital Stay (HOSPITAL_COMMUNITY): Payer: Medicare Other

## 2012-04-04 DIAGNOSIS — C8190 Hodgkin lymphoma, unspecified, unspecified site: Secondary | ICD-10-CM

## 2012-04-04 DIAGNOSIS — R5381 Other malaise: Secondary | ICD-10-CM

## 2012-04-04 DIAGNOSIS — R531 Weakness: Secondary | ICD-10-CM | POA: Diagnosis present

## 2012-04-04 DIAGNOSIS — R627 Adult failure to thrive: Secondary | ICD-10-CM | POA: Diagnosis present

## 2012-04-04 DIAGNOSIS — J189 Pneumonia, unspecified organism: Secondary | ICD-10-CM

## 2012-04-04 LAB — RENAL FUNCTION PANEL
BUN: 51 mg/dL — ABNORMAL HIGH (ref 6–23)
CO2: 23 mEq/L (ref 19–32)
Chloride: 109 mEq/L (ref 96–112)
GFR calc Af Amer: 32 mL/min — ABNORMAL LOW (ref >90)
Glucose, Bld: 192 mg/dL — ABNORMAL HIGH (ref 70–99)
Potassium: 4.5 mEq/L (ref 3.5–5.1)
Sodium: 142 mEq/L (ref 135–145)

## 2012-04-04 LAB — PROTIME-INR
INR: 1.04 (ref 0.00–1.49)
Prothrombin Time: 13.8 seconds (ref 11.6–15.2)

## 2012-04-04 LAB — CBC WITH DIFFERENTIAL/PLATELET
Basophils Absolute: 0 10*3/uL (ref 0.0–0.1)
Eosinophils Relative: 0 % (ref 0–5)
HCT: 26 % — ABNORMAL LOW (ref 39.0–52.0)
Lymphocytes Relative: 5 % — ABNORMAL LOW (ref 12–46)
Lymphs Abs: 0.7 10*3/uL (ref 0.7–4.0)
MCV: 81 fL (ref 78.0–100.0)
Monocytes Relative: 6 % (ref 3–12)
Neutro Abs: 12.1 10*3/uL — ABNORMAL HIGH (ref 1.7–7.7)
RBC: 3.21 MIL/uL — ABNORMAL LOW (ref 4.22–5.81)
RDW: 19.5 % — ABNORMAL HIGH (ref 11.5–15.5)
WBC: 13.6 10*3/uL — ABNORMAL HIGH (ref 4.0–10.5)

## 2012-04-04 LAB — APTT: aPTT: 59 seconds — ABNORMAL HIGH (ref 24–37)

## 2012-04-04 MED ORDER — LORAZEPAM 1 MG PO TABS
1.0000 mg | ORAL_TABLET | Freq: Four times a day (QID) | ORAL | Status: DC | PRN
  Administered 2012-04-04: 1 mg via ORAL
  Filled 2012-04-04: qty 1

## 2012-04-04 MED ORDER — BISACODYL 10 MG RE SUPP: 10.0000 mg | Freq: Every day | RECTAL | Status: DC | PRN

## 2012-04-04 MED ORDER — MEGESTROL ACETATE 400 MG/10ML PO SUSP
400.0000 mg | Freq: Two times a day (BID) | ORAL | Status: DC
  Administered 2012-04-04 – 2012-04-05 (×3): 400 mg via ORAL
  Filled 2012-04-04 (×4): qty 10

## 2012-04-04 NOTE — Progress Notes (Signed)
[facility] PROGRESS NOTE  Keith Bell:[id number] DOB: May 07, 1951 DOA: 03/23/2012 PCP: Eino Farber, MD   Brief narrative:  61 yo male with hx copd, hodgkins lymphoma, necrotizing pna presents to ED with progressive sob and cough with chills, subjective fever, pleuritic chest pain and sinus congestion as well as some nausea and 3 episodes of vomiting.In ED pt was tachycardic, hypotensive and chest xray concerning for pna. Of note pt was discharged from hospital 7/26 after 5 day stay for similar symptoms. Was discharge on levaquin to be taken for 3 week.   Assessment/Plan:    #1 hypotension  Resolved. Likely secondary to sepsis secondary to recurrent probable necrotizing pneumonia/healthcare associated pneumonia. Systolic blood pressure improved. Pro calcitonin was elevated at 13 on admission decreasing. Sputum Gram stain and cultures with fungus ?? Contaminant.. Blood cultures neg x 2. Urine Legionella is negative. Urine strep pneumococcus is negative. Vancomycin and  Zosyn d/c'd per ID. Patient s/p broncoscopy with cultures pending. PCCM following and appreciate input and recommendations. Continue supportive care.   #2 recurrent necrotizing pneumonia/healthcare associated pneumonia/?inflammatory vs fungal vs noninfectiuos vs BOOP  -This is patient's third hospitalization for right upper lobe pneumonia.  first episode in June of 2013,  second episode in July of 2013 with patient was diagnosed with a necrotizing pneumonia per pulmonary. Patient was discharged home on Levaquin and augmentin for 3 weeks, however concern as to whether patient took full course of antibiotics.  chest x-ray consistent with worsening pneumonia. Sputum Gram stain and cultures negative. Urine strep pneumococcus is negative. Blood cx neg x 2.  urine Legionella is negative. Pro calcitonin levels were elevated at 13 on admission and now at 5.61 and trending down. Lactic acid level was 2.1 on admission and  trending down.  Sputum with some fungus however likely contaminant.Repeat CT of the chest with continued progression of severe consolidated pneumonia in the right upper lobe superimposed on severe centrilobular emphysema and also  a space disease in the lingula with a peripheral nodular component suggestive of same process in the right upper lobe, mild airspace disease in the right lower lobe. - Nebs as needed. Mucinex. Tessalon Perles. CXR worsening. Pulmonary and critical care following. Patient is status post bronchoscopy which had the turbid-appearing fluid with elevated WBCs and elevated neutrophil count. Some growth of fungus in BAL. Cultures are pending. Antibiotics discontinued for now. Patient started on steroids.   #3 sepsis  Likely secondary to problem #2. Urinalysis is negative. Chest x-ray consistent with recurrent worsening pneumonia. CT of the chest with multilobar and pneumonia and progression of worsening recurrent right upper lobe pneumonia .Blood cultures are negative.  -. All antibiotics have been discontinued at this time per ID. patient be started on oral prednisone per pulmonary.  .  #4 acute on chronic kidney disease stage III  Patient's baseline creatinine is approximately 1.4-1.7. Creatinine on admission was 2.8. Likely secondary to a prerenal azotemia initially. 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 Patient is currently euvolemic.  Patient was given a dose of Lasix orally prior to starting steroids per pulmonary.. Renal ultrasound normal. Continue supportive care follow. Continue bicarbonate tablets. Renal fn slowly improved in am albs  #5 thrombocytopenia/Anemia  Likely secondary to sepsis. Patient is status post 3 unit of packed red blood cells. Afinitor has been discontinued. Hgb is now 9.0  LDH WNL. Haptoglobin elevated at 409. Peripheral smear negative for schistocytes, however with elevated d  dimer and  fibrinogen was concerning for DIC.  Patient was transfused one bag of platelets. Platelet count went up to 80 and is back down to 40 now. Patient does not have  active bleeding. Repeat DIC panel with elevated d-dimer and fibrinogen levels. No schistocytes.  -previous hospitalist  Discussed with Dr Arline Asp of heme/onc on friday. Patient has also been started on steroids per pulmonary. Monitor for now. Hematology following and appreciate input and recommendations. Transfuse platelets if spontaneously bleeding or platelet count less than 20,000   #6 COPD  Stable. Nebs when necessary.   #7 metabolic acidosis  Likely secondary to sepsis versus acute on chronic kidney disease stage III. Improved with HCO3 in IVF initially.on oral bicarb  #8 hypomagnesemia/hypokalemia  Replete potassium.   #9 hypernatremia  Improved with change of IV fluids.   #10 constipation  Resolved with MiraLAX. Colace 100 mg twice a day.   #11 Hodgkin's lymphoma, relapse  Afinitor has been discontinued secondary to his anemia, thrombocytopenia, sepsis. Hematology oncology is following.    #13 prognosis  Patient has been doing poorly during this hospitalization and has been on a gradual decline during this past year. Patient has had multiple admissions over the past 3 months for this pulmonary issue with unknown etiology. Patient has had acute on chronic kidney disease which was felt per renal to be likely secondary to his Hodgkin's lymphoma and his renal function had worsened Patient has had a BAL and currently on steroids. Patient has had a anemia and they thrombocytopenia with platelets dropping as low as 16 and has received 2 bags of platelets during this hospitalization. Patient has also been transfused packed red blood cells during this hospitalization.  Palliative care team met with patient today. patient and family wish to go to [place] and  be followed by hospice. He wishes to be DNR while in the hospital  but s ok with antibiotics and transfusion if needed  #14: Code Status  Discussed CODE STATUS with patient after reviewing Dr. Mamie Levers note from 03/17/2011. Patient is in agreement with a Limited code. No CPR. No mechanical ventilation. May use pressors or antiarrhythmics.   Code Status: Limited code.  Family Communication: Updated patient and daughter and mother at bedside.   Disposition Plan: Home with hospice services    Consultants:  Pulmonary: Dr Wert/Dr. Craige Cotta 03/24/12  Hematolgy/Oncology: Dr Darrold Span 03/24/12  Renal : Dr. Arlean Hopping 03/28/12 ID ( [place])  Procedures:  CXR 03/23/12  Status post 1 unit of packed red blood cells on 03/23/2012  V/Q scan 03/24/12  CT Chest 03/24/12  Renal US 03/24/12  2 units PRBC 03/25/12  Platelets 1 bag 03/29/12  Bronchial alveolar lavage 03/29/2012  Platelets 1 bag 8 /17/ 2013  Antibiotics:  IV Vancomycin 03/23/12---> 03/26/12  IV Zosyn 03/23/12--->03/28/12  HPI/Subjective:  Patient in bed. Feels his breathing to be ok   Objective: Filed Vitals:   04/04/12 0500 04/04/12 0747 04/04/12 0817 04/04/12 1615  BP: 165/82   141/82  Pulse: 97   82  Temp: 97.7 F (36.5 C)   98.2 F (36.8 C)  TempSrc: Oral   Oral  Resp: 17     Height:      Weight: 53.5 kg (117 lb 15.1 oz)     SpO2: 96% 96% 96% 100%    Intake/Output Summary (Last 24 hours) at 04/04/12 1640 Last data filed at 04/04/12 1600  Gross per 24 hour  Intake 762.08 ml  Output   1130 ml  Net -367.92 ml   Ceasar Mons  Weights   04/01/12 0439 04/03/12 0646 04/04/12 0500  Weight: 58.8 kg (129 lb 10.1 oz) 54.4 kg (119 lb 14.9 oz) 53.5 kg (117 lb 15.1 oz)    Exam:  General: Cachectic. HEENT: No bruits, no goiter.  Heart: Tachycardia, without murmurs, rubs, gallops.  Lungs: Coarse BS b/l.  Abdomen: Soft, nontender, nondistended, positive bowel sounds.  Extremities: No clubbing cyanosis. 1-2 + BLE edema with positive pedal pulses.  Neuro: Grossly intact, nonfocal.   Data Reviewed: Basic  Metabolic Panel:  Lab 04/04/12 8295 04/03/12 0540 04/02/12 0500 04/01/12 0408 03/31/12 0523  NA 142 145 144 146* 145  K 4.5 4.3 3.2* 4.0 4.2  CL 109 111 110 113* 113*  CO2 23 22 21  18* 17*  GLUCOSE 192* 138* 164* 131* 188*  BUN 51* 55* 63* 56* 46*  CREATININE 2.41* 2.82* 3.22* 3.36* 3.03*  CALCIUM 8.7 8.6 8.1* 8.2* 8.5  MG -- -- -- -- --  PHOS 3.3 3.1 4.1 4.8* 5.1*   Liver Function Tests:  Lab 04/04/12 0405 04/03/12 0540 04/02/12 0500 04/01/12 0408 03/31/12 0523  AST -- -- -- -- 8  ALT -- -- -- -- 5  ALKPHOS -- -- -- -- 330*  BILITOT -- -- -- -- 0.2*  PROT -- -- -- -- 4.9*  ALBUMIN 1.7* 1.7* 1.6* 1.5* 1.3*   No results found for this basename: LIPASE:5,AMYLASE:5 in the last 168 hours No results found for this basename: AMMONIA:5 in the last 168 hours CBC:  Lab 04/04/12 0405 04/03/12 0540 04/02/12 0535 04/01/12 0404 03/31/12 1438 03/30/12 0345  WBC 13.6* 14.3* 12.5* 15.8* 19.8* --  NEUTROABS 12.1* 12.5* 11.3* 14.4* -- 9.6*  HGB 8.9* 8.1* 9.0* 9.3* 11.2* --  HCT 26.0* 23.6* 25.7* 26.5* 32.4* --  MCV 81.0 80.8 80.6 81.0 82.4 --  PLT 40* 31* 31* 49* 80* --   Cardiac Enzymes: No results found for this basename: CKTOTAL:5,CKMB:5,CKMBINDEX:5,TROPONINI:5 in the last 168 hours BNP (last 3 results)  Basename 03/24/12 1149 03/05/12 2100 02/22/12 1600  PROBNP 1538.0* 479.3* 1118.0*   CBG: No results found for this basename: GLUCAP:5 in the last 168 hours  Recent Results (from the past 240 hour(s))  AFB CULTURE WITH SMEAR     Status: Normal (Preliminary result)   Collection Time   03/29/12 10:42 AM      Component Value Range Status Comment   Specimen Description BRONCHIAL ALVEOLAR LAVAGE LEFT   Final    Special Requests Immunocompromised   Final    ACID FAST SMEAR NO ACID FAST BACILLI SEEN   Final    Culture     Final    Value: CULTURE WILL BE EXAMINED FOR 6 WEEKS BEFORE ISSUING A FINAL REPORT   Report Status PENDING   Incomplete   FUNGUS CULTURE W SMEAR     Status: Normal  (Preliminary result)   Collection Time   03/29/12 10:42 AM      Component Value Range Status Comment   Specimen Description BRONCHIAL ALVEOLAR LAVAGE LEFT   Final    Special Requests Immunocompromised   Final    Fungal Smear NO YEAST OR FUNGAL ELEMENTS SEEN   Final    Culture CANDIDA TROPICALIS   Final    Report Status PENDING   Incomplete   CULTURE, BAL-QUANTITATIVE     Status: Normal   Collection Time   03/29/12 10:42 AM      Component Value Range Status Comment   Specimen Description BRONCHIAL ALVEOLAR LAVAGE LEFT   Final  Special Requests NONE   Final    Gram Stain     Final    Value: FEW WBC PRESENT,BOTH PMN AND MONONUCLEAR     NO ORGANISMS SEEN   Colony Count 3,000 COLONIES/ML   Final    Culture YEAST CONSISTENT WITH CANDIDA SPECIES   Final    Report Status 04/01/2012 FINAL   Final   FUNGUS CULTURE W SMEAR     Status: Normal (Preliminary result)   Collection Time   03/29/12 11:54 AM      Component Value Range Status Comment   Specimen Description BRONCHIAL ALVEOLAR LAVAGE RIGHT   Final    Special Requests Immunocompromised   Final    Fungal Smear RARE YEAST WITH PSEUDOHYPHAE   Final    Culture CANDIDA FAMATA   Final    Report Status PENDING   Incomplete   CULTURE, BAL-QUANTITATIVE     Status: Normal   Collection Time   03/29/12 11:54 AM      Component Value Range Status Comment   Specimen Description BRONCHIAL ALVEOLAR LAVAGE RIGHT   Final    Special Requests NONE   Final    Gram Stain     Final    Value: FEW WBC PRESENT,BOTH PMN AND MONONUCLEAR     NO SQUAMOUS EPITHELIAL CELLS SEEN     NO ORGANISMS SEEN   Colony Count 20,OOO COLONIES/ML   Final    Culture YEAST CONSISTENT WITH CANDIDA SPECIES   Final    Report Status 03/31/2012 FINAL   Final   MRSA PCR SCREENING     Status: Normal   Collection Time   03/29/12  3:33 PM      Component Value Range Status Comment   MRSA by PCR NEGATIVE  NEGATIVE Final      Studies: Dg Chest 2 View  03/30/2012  *RADIOLOGY REPORT*   Clinical Data: Follow up pulmonary infiltrates.  Hypertension. Short of breath.  CHEST - 2 VIEW  Comparison: 03/29/2012.  Findings: Worsening aeration at the lung bases.  Progressive airspace disease, particularly at the cardiac apex.  Differential considerations include worsening pneumonia, edema, or aspiration. Combination of these densities could be present as well. Consolidation of the right upper lobe respecting the fissure. Grossly, the cardiopericardial silhouette appears unchanged.  IMPRESSION:  1.  Unchanged appearance of the right upper lobe. 2.  Marked worsening of basilar airspace disease as described above.  Original Report Authenticated By: Andreas Newport, M.D.   Dg Chest 2 View  03/28/2012  *RADIOLOGY REPORT*  Clinical Data: Cough, follow up right upper lobe infiltrate  CHEST - 2 VIEW  Comparison: 03/26/2012  Findings: Cardiomediastinal silhouette is stable.  Stable right Port-A-Cath position.  Persistent right upper lobe consolidation. Worsening airspace disease in the right lower lobe.  Stable streaky left lower lobe atelectasis or infiltrate.  IMPRESSION:  Stable right Port-A-Cath position.  Persistent right upper lobe consolidation.  Worsening airspace disease in the right lower lobe. Stable streaky left lower lobe atelectasis or infiltrate.  Original Report Authenticated By: Natasha Mead, M.D.   Dg Chest 2 View  03/23/2012  *RADIOLOGY REPORT*  Clinical Data: Cough.  Pleurisy.  Short of breath.  History of cancer.  CHEST - 2 VIEW  Comparison: 03/05/2012.  Findings: Progressive airspace disease is present in the right upper lobe, best seen on the lateral view.  When comparing lateral views to prior chest radiograph 03/05/2012, there is continued/progressive consolidation of the right upper lobe.  Support apparatus appears unchanged.  There is some  faint airspace disease in the left midlung as well.  Cardiopericardial silhouette unchanged.  Right IJ power port appears similar.  IMPRESSION:  Progressive consolidation of the right upper lobe, best seen on the lateral view.  Faint airspace opacity in the left midlung, which may also represent pneumonia.  Original Report Authenticated By: Andreas Newport, M.D.   Dg Chest 2 View  03/05/2012  *RADIOLOGY REPORT*  Clinical Data: Respiratory distress.  CHEST - 2 VIEW  Comparison: 02/27/2012.  Findings: The power port is stable.  The right upper lobe airspace process shows a slight decrease and consolidation.  The remainder the lungs are clear.  IMPRESSION: Persistent but slight improved right upper lobe pneumonia.  Original Report Authenticated By: P. Loralie Champagne, M.D.   Ct Chest Wo Contrast  03/24/2012  *RADIOLOGY REPORT*  Clinical Data: Recurrent pneumonia.  History of Hodgkin's lymphoma.  CT CHEST WITHOUT CONTRAST  Technique:  Multidetector CT imaging of the chest was performed following the standard protocol without IV contrast.  Comparison: Chest radiograph 03/23/2012, CT 02/23/2012, PET CT scan 12/21/2011  Findings: Compared to the CT of 02/23/2012, there is again demonstrated a dense pneumonia within the right upper lobe superimposed on severe central lobular emphysema.  This process involves more of the right upper lobe tahan prior and is now confluent throughout the right upper lobe and apex.  There is a new curvilinear gas collection posterior at the right lung apex which either represents a confluence of the severe central emphysema or a pneumothorax.  No significant volume loss associated this process. The air bronchograms seen in the right middle lobe on prior exam are filled.(Image 24).  There is new nodular branching air space disease within the lingula (image 40).  Likewise in the posterior right lower lobe there is increase in interlobular septal thickening and mild air space disease.  There is small right effusion at the right lung base which is increased.  Central airways are normal.  There is a port in the right chest wall.  No  axillary supraclavicular adenopathy.  Small mediastinal lymph nodes similar to prior.  Esophagus is thick-walled.  Heart appears normal this noncontrast exam.  Review of the upper abdomen is somewhat limited due to high density barium within the splenic flexure the colon.  The spleen appears normal.  Limited view of the skeleton is unremarkable.  IMPRESSION:  1.  Continued progression of severe consolidated pneumonia in the right upper lobe superimposed on severe central lobular emphysema. 2.  Some retraction of the pleural edge at the posterior right lung apex which either represents  consolidation of central lobular emphysema or small pneumothorax. 3.  There is new air space disease in the lingula with a peripheral nodular component which suggest progression of the same process in the right upper lobe. 4.  Mild air space disease in the right lower lobe.  Original Report Authenticated By: Genevive Bi, M.D.   Nm Pulmonary Perfusion  03/24/2012  *RADIOLOGY REPORT*  Clinical Data:  Short of breath.  History of Hodgkin's lymphoma. Severe right upper lobe pneumonia which is progressing by CT.  NUCLEAR MEDICINE VENTILATION - PERFUSION LUNG SCAN  Technique:    Perfusion images were obtained in multiple projections after intravenous injection of Tc-87m MAA.  Radiopharmaceuticals: 3.10 mCi Tc-35m MAA.  Comparison: Perfusion scan 07/10 1013  Findings:  Perfusion: There is marked decrease  perfusion to the right upper lobe which corresponds to the the consolidated pneumonia in the right upper lobe.  There are no peripheral perfusion  defects at the lung base to  suggest acute pulmonary acute pulmonary embolism. There is some heterogeneity of perfusion to the left upper lobe similar to prior and related to emphysema. Pattern matches the pulmonary perfusion scan pattern of 1 month prior.   IMPRESSION:  Very low probability for acute pulmonary embolism.  Original Report Authenticated By: Genevive Bi, M.D.   Dg  Esophagus  03/08/2012  *RADIOLOGY REPORT*  Clinical Data: Dysphagia.  ESOPHOGRAM / BARIUM SWALLOW  Technique:  Combined double contrast and single contrast examination performed using effervescent crystals, thick barium liquid, and thin barium liquid.  Fluoroscopy time:  1.2 minutes.  Comparison: 02/23/2012  Findings:  The cervical and thoracic esophagus are patent.  No high- grade stricture or mass.  Normal motility of the esophagus.  The patient ingested a 13 mm barium tablet which easily passed through the esophagus and into the stomach.  No reflux identified.  IMPRESSION:  1.  Normal esophagram.  Original Report Authenticated By: Rosealee Albee, M.D.   US Renal  03/25/2012  *RADIOLOGY REPORT*  Clinical Data: Renal failure  RENAL/URINARY TRACT ULTRASOUND COMPLETE  Comparison: PET CT scan 12/21/2011  Findings:  Right Kidney = 11.4 cm.  No evidence of hydronephrosis, cyst, mass, or stone.  Left kidney = 10.8 cm.  No evidence of hydronephrosis, cyst, mass, or stone.  Bladder:  Bladder partially distended.  No irregularity identified.  IMPRESSION:  Normal renal ultrasound.  Original Report Authenticated By: Genevive Bi, M.D.   Dg Chest Port 1 View  04/04/2012  *RADIOLOGY REPORT*  Clinical Data: Follow up right upper lobe infiltrate  PORTABLE CHEST - 1 VIEW  Comparison: 03/30/2012  Findings: There is a right chest wall porta-catheter with tip in the SVC.  Heart size is normal.  No pleural effusion or edema. Advanced changes of emphysema noted.  Superimposed bilateral airspace disease is again noted.  When compared with the previous exam there has been significant improvement in aeration to both lung bases.  There has also been mild improvement in right upper lobe consolidation.  IMPRESSION:  1.  Overall improved appearance of the lungs with interval decrease in bibasilar infiltrates.   Original Report Authenticated By: Rosealee Albee, M.D.    Dg Chest Port 1 View  03/29/2012  *RADIOLOGY REPORT*   Clinical Data: Status post bronchoscopy, respiratory distress  PORTABLE CHEST - 1 VIEW  Comparison: 03/28/2012  Findings: Cardiomediastinal silhouette is stable.  Stable right Port-A-Cath position.  Stable consolidation in the right upper lobe.  Mild improved aeration in the right lower lobe.  Persistent patchy airspace disease right lower lobe and left midlung.  No diagnostic pneumothorax.  No convincing pulmonary edema.  IMPRESSION: Stable right Port-A-Cath position.  Stable consolidation in the right upper lobe.  Mild improved aeration in the right lower lobe. Persistent patchy airspace disease right lower lobe and left midlung.  No diagnostic pneumothorax.  No convincing pulmonary edema.  Original Report Authenticated By: Natasha Mead, M.D.   Dg Chest Port 1 View  03/26/2012  *RADIOLOGY REPORT*  Clinical Data: Pneumonia.  PORTABLE CHEST - 1 VIEW  Comparison: 03/24/2012.  03/23/2012.  Findings: Compared to the prior chest radiograph, there is worsening aeration at the bases, there is worsening aeration at the bases, likely representing pulmonary edema superimposed on the right upper lobe consolidation.  Right IJ power port is unchanged. Mediastinal contours appear within normal limits.  There is no plain film evidence of pneumothorax.  IMPRESSION: 1.  Unchanged support apparatus. 2.  Persistent right  upper lobe consolidation. 3.  Development of airspace disease at both lung bases.  This is favored to represent edema however aspiration or multifocal pneumonia are in the differential considerations.  Original Report Authenticated By: Andreas Newport, M.D.    Scheduled Meds:   . sodium chloride   Intravenous Once  . albuterol  2.5 mg Nebulization BID  . antiseptic oral rinse  15 mL Mouth Rinse q12n4p  . benzonatate  200 mg Oral TID  . chlorhexidine  15 mL Mouth Rinse BID  . docusate sodium  100 mg Oral BID  . feeding supplement  237 mL Oral BID BM  . guaiFENesin-dextromethorphan  15 mL Oral Q6H  .  ipratropium  0.5 mg Nebulization BID  . megestrol  400 mg Oral BID  . multivitamin with minerals  1 tablet Oral Daily  . pantoprazole sodium  40 mg Oral Q1200  . potassium chloride SA  20 mEq Oral Daily  . predniSONE  60 mg Oral Q breakfast  . sodium bicarbonate  1,300 mg Oral TID  . sodium chloride  3 mL Intravenous Q12H  . DISCONTD: furosemide  20 mg Intravenous Once  . DISCONTD: megestrol  400 mg Oral BID   Continuous Infusions:   . dextrose 20 mL (04/03/12 1749)      Time spent: 35 minutes    Karlena Luebke  [facility] Pager [phone]. If 8PM-8AM, please contact night-coverage at [email or web address], password [email or web address] 04/04/2012, 4:40 PM  LOS: 12 days

## 2012-04-04 NOTE — Progress Notes (Signed)
Transferred to Palliative Care Rm [id number]. Report given to [facility].Patients family at bedside.Patient is alert and oriented but weak.

## 2012-04-04 NOTE — Progress Notes (Signed)
PT Cancellation Note  Treatment cancelled today due to medical issues with patient which prohibited therapy-pt recently medicated. Pt/family request PT to check back another day. Thanks.  Rebeca Alert [facility] 04/04/2012, 2:48 PM [phone]

## 2012-04-04 NOTE — Progress Notes (Signed)
[facility] Bell was seen this morning and was sitting in a chair looking fairly comfortable on 3 L per minute of nasal oxygen. He indicated that he is able to move around the room and that he felt stronger. He is still on prednisone 60 mg a day. His bronchial washings appear to be growing out Candida species, the significance of which is unclear to me. I was unable to contact Dr. Ninetta Lights late this afternoon and I will try to reach him tomorrow.  A 1 view chest x-ray today suggested significant improvement compared with the chest x-ray from August 16. In addition, the patient's renal function seems to be improving with a creatinine that had peaked at 3.36 on August 18 and today was 2.41. The patient's albumin also has improved, today 1.7 up from 1.3 on August 17. The patient's platelet count today was 40,000 also representing improvement. The PT and PTT also have improved. Of note was the detection of the lupus anticoagulant which may account for the prolonged PTT. Keith Bell received 1 unit of packed red cells yesterday bringing his hemoglobin up to 8.9 from 8.1.  Keith Bell has been evaluated by the palliative care team. I note that he has been moved to the palliative care unit and that his CODE STATUS is now DO NOT RESUSCITATE whereas previously he was a limited code. Apparently it is the wish of the patient and his family for him to go to [facility] and to be followed by hospice. I am certainly in favor of discharging [facility] as soon as this seems to be feasible.  I can be reached on pager [phone].   [name] 04/04/2012 9:15 PM

## 2012-04-04 NOTE — Consult Note (Signed)
Patient JY:NWGN HERNY SCURLOCK      DOB: 06-09-1951      FAO:[id number]     Consult Note from the Palliative Medicine Team at [facility]    Consult Requested by: Dr Gonzella Lex     PCP: Eino Farber, MD Reason for Consultation:Clarification of GOC and options     Phone Number:[phone]  Assessment of patients Current state: Multiple co-morbidities, overall failure to thrive and limited prognosis-patient is hopeful to dc home to [place] with hospice services   This NP Lorinda Creed reviewed medical records, received report from team, assessed the patient and then meet at the patient's bedside along with his mother Theotis Gerdeman and his two sisters Dayten Juba and Steward Drone Draughn son Shirleen Schirmer and Daughter patricia Eulah Pont  to discuss diagnosis prognosis, GOC, EOL wishes disposition and options.   A detailed discussion was had today regarding advanced directives.  Concepts specific to code status, artifical feeding and hydration, continued IV antibiotics and rehospitalization was had.  The difference between a aggressive medical intervention path  and a palliative comfort care path for this patient at this time was had.  Values and goals of care important to patient and family were attempted to be elicited.  Concept of Hospice and Palliative Care were discussed   Natural trajectory and expectations at EOL were discussed.  Questions and concerns addressed.  Hard Choices booklet left for review. Family encouraged to call with questions or concerns.  PMT will continue to support holistically.     Goals of Care: 1.  Code Status: DNR/DNI   2. Scope of Treatment: 1. Vital Signs:per unit 2. Respiratory/Oxygen: as needed 3. Nutritional Support/Tube Feeds: no artificial feeding now or in the future 4. Antibiotics:as needed to treat until discharge 5. Blood Products:as needed to treat until discahrge 6. IVF:as need to treat until discharge 7. Labs: as needed to treat until  discharge 8. Telemetry:none  Continue with present  medical treatment plan until discharge when patient will shift to comfort care approach under hospice benefit   4. Disposition: Transfer to Palliative Care unit and then when logistics in place transfer home to mother's house in [place], [place]  Contact to hospice in [place] # [phone]   Mother: Zaid Tomes [address] [place] [address]  Family toured the Palliative Care unit    3. Symptom Management:   1. Anxiety/Agitation: Ativan 1 mg every 6 hours prn 2. Pain: Vicodan as previously written 3. Bowel Regimen: Dulcolax supp  4. Psychosocial:  Emotional support offered to patient and family at bedside.  All expressed thoughts and feeling about present situation.  All involved support patient wishes  5. Spiritual: Consult to chaplain services        Patient Documents Completed or Given: Document Given Completed  Advanced Directives Pkt    MOST    DNR    Gone from [facility]    Hard Choices  yes    Brief HPI: Multiple co-morbidities (stage iv Hodgkins disease,COPD, CKD,frequent rehospitalization ) overall failure to thrive, hypoalbuminemia  and limited prognosis-patient is hopeful to dc home to [place] with hospice services     ROS: weakness, cough, fatigue, weight loss    PMH:  Past Medical History  Diagnosis Date  . Dyslipidemia 2005  . Cancer     stomach  . Hypertension     No longer on BP pills x 4 years  . Hodgkin's lymphoma   . CKD (chronic kidney disease), stage III   .  Anemia   . Iron deficiency anemia 02/08/2012     PSH: Past Surgical History  Procedure Date  . No past surgeries   . Video bronchoscopy 03/29/2012    Procedure: VIDEO BRONCHOSCOPY WITH FLUORO;  Surgeon: Coralyn Helling, MD;  Location: [facility] ENDOSCOPY;  Service: Cardiopulmonary;  Laterality: Bilateral;   I have reviewed the FH and SH and  If appropriate update it with new information. Allergies  Allergen  Reactions  . Aspirin Nausea Only   Scheduled Meds:   . sodium chloride   Intravenous Once  . acetaminophen  650 mg Oral Once  . albuterol  2.5 mg Nebulization BID  . antiseptic oral rinse  15 mL Mouth Rinse q12n4p  . benzonatate  200 mg Oral TID  . chlorhexidine  15 mL Mouth Rinse BID  . diphenhydrAMINE  25 mg Oral Once  . docusate sodium  100 mg Oral BID  . feeding supplement  237 mL Oral BID BM  . guaiFENesin-dextromethorphan  15 mL Oral Q6H  . ipratropium  0.5 mg Nebulization BID  . megestrol  400 mg Oral BID  . multivitamin with minerals  1 tablet Oral Daily  . pantoprazole sodium  40 mg Oral Q1200  . potassium chloride SA  20 mEq Oral Daily  . predniSONE  60 mg Oral Q breakfast  . sodium bicarbonate  1,300 mg Oral TID  . sodium chloride  3 mL Intravenous Q12H  . DISCONTD: furosemide  20 mg Intravenous Once  . DISCONTD: megestrol  400 mg Oral BID   Continuous Infusions:   . dextrose 20 mL (04/03/12 1749)   PRN Meds:.albuterol, chlorproMAZINE, gi cocktail, HYDROcodone-acetaminophen, HYDROcodone-homatropine, ipratropium, nitroGLYCERIN, ondansetron, sodium chloride, temazepam    BP 165/82  Pulse 97  Temp 97.7 F (36.5 C) (Oral)  Resp 17  Ht 5\' 6"  (1.676 m)  Wt 53.5 kg (117 lb 15.1 oz)  BMI 19.04 kg/m2  SpO2 96%   PPS: 30%   Intake/Output Summary (Last 24 hours) at 04/04/12 1302 Last data filed at 04/04/12 1029  Gross per 24 hour  Intake 1494.25 ml  Output    930 ml  Net 564.25 ml                      Stool Softner: dulcolax supp  Physical Exam:  General: chronically ill appearing, emaciated black male HEENT:  + temporal muscle wasting, moist membranes no exudate noted Chest:  Scattered course BS  CVS: RRR Abdomen:soft NT +BS Ext: without edema,, noted muscle wating Neuro:alert and oriented X3, engaged in conversation  Labs: CBC    Component Value Date/Time   WBC 13.6* 04/04/2012 0405   WBC 13.9* 03/16/2012 1055   RBC 3.21* 04/04/2012 0405   RBC  3.35* 03/16/2012 1055   HGB 8.9* 04/04/2012 0405   HGB 10.0* 03/16/2012 1055   HCT 26.0* 04/04/2012 0405   HCT 30.6* 03/16/2012 1055   PLT 40* 04/04/2012 0405   PLT 101* 03/16/2012 1055   MCV 81.0 04/04/2012 0405   MCV 91.4 03/16/2012 1055   MCH 27.7 04/04/2012 0405   MCH 29.9 03/16/2012 1055   MCHC 34.2 04/04/2012 0405   MCHC 32.7 03/16/2012 1055   RDW 19.5* 04/04/2012 0405   RDW 18.0* 03/16/2012 1055   LYMPHSABS 0.7 04/04/2012 0405   LYMPHSABS 0.9 03/16/2012 1055   MONOABS 0.8 04/04/2012 0405   MONOABS 0.8 03/16/2012 1055   EOSABS 0.0 04/04/2012 0405   EOSABS 0.7* 03/16/2012 1055   BASOSABS 0.0 04/04/2012 0405  BASOSABS 0.1 03/16/2012 1055    BMET    Component Value Date/Time   NA 142 04/04/2012 0405   K 4.5 04/04/2012 0405   CL 109 04/04/2012 0405   CO2 23 04/04/2012 0405   GLUCOSE 192* 04/04/2012 0405   BUN 51* 04/04/2012 0405   CREATININE 2.41* 04/04/2012 0405   CALCIUM 8.7 04/04/2012 0405   GFRNONAA 27* 04/04/2012 0405   GFRAA 32* 04/04/2012 0405    CMP     Component Value Date/Time   NA 142 04/04/2012 0405   K 4.5 04/04/2012 0405   CL 109 04/04/2012 0405   CO2 23 04/04/2012 0405   GLUCOSE 192* 04/04/2012 0405   BUN 51* 04/04/2012 0405   CREATININE 2.41* 04/04/2012 0405   CALCIUM 8.7 04/04/2012 0405   PROT 4.9* 03/31/2012 0523   ALBUMIN 1.7* 04/04/2012 0405   AST 8 03/31/2012 0523   ALT 5 03/31/2012 0523   ALKPHOS 330* 03/31/2012 0523   BILITOT 0.2* 03/31/2012 0523   GFRNONAA 27* 04/04/2012 0405   GFRAA 32* 04/04/2012 0405       Time In Time Out Total Time Spent with Patient Total Overall Time  1200 1345 90 minutes 105 min    Greater than 50%  of this time was spent counseling and coordinating care related to the above assessment and plan.  Dr Gonzella Lex aware of above  Lorinda Creed NP [phone]

## 2012-04-05 LAB — BASIC METABOLIC PANEL
Calcium: 8.6 mg/dL (ref 8.4–10.5)
Creatinine, Ser: 2.06 mg/dL — ABNORMAL HIGH (ref 0.50–1.35)
GFR calc Af Amer: 38 mL/min — ABNORMAL LOW (ref >90)
GFR calc non Af Amer: 33 mL/min — ABNORMAL LOW (ref >90)
Sodium: 143 mEq/L (ref 135–145)

## 2012-04-05 LAB — CBC WITH DIFFERENTIAL/PLATELET
Basophils Absolute: 0 10*3/uL (ref 0.0–0.1)
Basophils Relative: 0 % (ref 0–1)
Eosinophils Absolute: 0 10*3/uL (ref 0.0–0.7)
Hemoglobin: 8.1 g/dL — ABNORMAL LOW (ref 13.0–17.0)
Lymphocytes Relative: 5 % — ABNORMAL LOW (ref 12–46)
MCH: 27.6 pg (ref 26.0–34.0)
MCHC: 34.2 g/dL (ref 30.0–36.0)
Monocytes Absolute: 0.8 10*3/uL (ref 0.1–1.0)
Neutrophils Relative %: 90 % — ABNORMAL HIGH (ref 43–77)
Platelets: 46 10*3/uL — ABNORMAL LOW (ref 150–400)
RBC: 2.93 MIL/uL — ABNORMAL LOW (ref 4.22–5.81)

## 2012-04-05 LAB — PTT FACTOR INHIBITOR (MIXING STUDY)
Patient post incubation: 64 seconds
Patient-1/1, Incubated Mix-PTT: 45 seconds
Post inc 1:1 mix, npp + pt: 46 seconds

## 2012-04-05 MED ORDER — HYDROCODONE-ACETAMINOPHEN 5-500 MG PO TABS: 1.0000 | ORAL_TABLET | Freq: Four times a day (QID) | ORAL | Status: AC | PRN

## 2012-04-05 MED ORDER — GUAIFENESIN-DM 100-10 MG/5ML PO SYRP: 15.0000 mL | ORAL_SOLUTION | Freq: Four times a day (QID) | ORAL | Status: AC

## 2012-04-05 MED ORDER — PREDNISONE 20 MG PO TABS: 40.0000 mg | ORAL_TABLET | Freq: Every day | ORAL | Status: AC

## 2012-04-05 MED ORDER — LORAZEPAM 1 MG PO TABS: 1.0000 mg | ORAL_TABLET | Freq: Four times a day (QID) | ORAL | Status: AC | PRN

## 2012-04-05 MED ORDER — IPRATROPIUM BROMIDE 0.02 % IN SOLN: 0.5000 mg | Freq: Two times a day (BID) | RESPIRATORY_TRACT | Status: AC

## 2012-04-05 MED ORDER — HEPARIN SOD (PORK) LOCK FLUSH 100 UNIT/ML IV SOLN
INTRAVENOUS | Status: AC
  Administered 2012-04-05: 16:00:00
  Filled 2012-04-05: qty 5

## 2012-04-05 MED ORDER — ALBUTEROL SULFATE (5 MG/ML) 0.5% IN NEBU: 2.5000 mg | INHALATION_SOLUTION | Freq: Four times a day (QID) | RESPIRATORY_TRACT | Status: AC | PRN

## 2012-04-05 MED ORDER — ENSURE COMPLETE PO LIQD: 237.0000 mL | Freq: Two times a day (BID) | ORAL | Status: AC

## 2012-04-05 MED ORDER — BENZONATATE 200 MG PO CAPS: 200.0000 mg | ORAL_CAPSULE | Freq: Three times a day (TID) | ORAL | Status: AC

## 2012-04-05 MED ORDER — GI COCKTAIL ~~LOC~~: 30.0000 mL | Freq: Three times a day (TID) | ORAL | Status: AC | PRN

## 2012-04-05 MED ORDER — DSS 100 MG PO CAPS: 100.0000 mg | ORAL_CAPSULE | Freq: Two times a day (BID) | ORAL | Status: AC

## 2012-04-05 MED ORDER — MEGESTROL ACETATE 400 MG/10ML PO SUSP: 400.0000 mg | Freq: Two times a day (BID) | ORAL | Status: AC

## 2012-04-05 NOTE — Discharge Summary (Signed)
Physician Discharge Summary  METRO EDENFIELD ZOX:[id number] DOB: Oct 20, 1950 DOA: 03/23/2012  PCP: Eino Farber, MD  Admit date: 03/23/2012 Discharge date: 04/05/2012  Recommendations for Outpatient Follow-up:  1. Discharge home with hospice  Discharge Diagnoses:  Principal Problem:  *Sepsis  Active Problems:  Hodgkin's lymphoma in relapse  COPD (chronic obstructive pulmonary disease)  Hyperlipidemia  Hypotension  Acute on chronic renal failure  Iron deficiency anemia  CKD (chronic kidney disease), stage III  Metabolic acidosis  Necrotizing pneumonia  HCAP (healthcare-associated pneumonia)  Thrombocytopenia  Hypomagnesemia  Hypokalemia  Hypernatremia  Weakness generalized  Adult failure to thrive    Discharge Condition: fair to poor  Diet recommendation: regular  Filed Weights   04/01/12 0439 04/03/12 0646 04/04/12 0500  Weight: 58.8 kg (129 lb 10.1 oz) 54.4 kg (119 lb 14.9 oz) 53.5 kg (117 lb 15.1 oz)    History of present illness:  61 yo male with hx copd, hodgkins lymphoma, necrotizing pna presents to ED with progressive sob and cough with chills, subjective fever, pleuritic chest pain and sinus congestion as well as some nausea and 3 episodes of vomiting.In ED pt was tachycardic, hypotensive and chest xray concerning for pna. Of note pt was discharged from hospital 7/26 after 5 day stay for similar symptoms. Was discharge on levaquin to be taken for 3 week.   Hospital course:  #1 hypotension  Resolved. Likely secondary to sepsis secondary to recurrent probable necrotizing pneumonia/healthcare associated pneumonia. Systolic blood pressure improved. Pro calcitonin was elevated at 13 on admission decreasing. Sputum Gram stain and cultures with fungus ?? Contaminant.. Blood cultures neg x 2. Urine Legionella is negative. Urine strep pneumococcus is negative. Vancomycin and Zosyn d/c'd per ID. Patient s/p broncoscopy with BAL growing candida which on discussion  with ID appears to be colonization requiring no treatment..Continue supportive care.   #2 recurrent necrotizing pneumonia/healthcare associated pneumonia/?inflammatory vs fungal vs noninfectiuos vs BOOP  -This is patient's third hospitalization for right upper lobe pneumonia.  first episode in June of 2013, second episode in July of 2013 with patient was diagnosed with a necrotizing pneumonia per pulmonary. Patient was discharged home on Levaquin and augmentin for 3 weeks, however concern as to whether patient took full course of antibiotics. chest x-ray consistent with worsening pneumonia. Sputum Gram stain and cultures negative. Urine strep pneumococcus is negative. Blood cx neg x 2. urine Legionella is negative. Pro calcitonin levels were elevated at 13 on admission and now at 5.61 and trending down. Lactic acid level was 2.1 on admission and trending down. Sputum with some fungus however likely contaminant.Repeat CT of the chest with continued progression of severe consolidated pneumonia in the right upper lobe superimposed on severe centrilobular emphysema and also a space disease in the lingula with a peripheral nodular component suggestive of same process in the right upper lobe, mild airspace disease in the right lower lobe.  - Nebs as needed. Mucinex. Tessalon Perles. CXR worsening.  Patient is status post bronchoscopy which had the turbid-appearing fluid with elevated WBCs and elevated neutrophil count. BAL growing candida which as per ID is colonization and does not need treatment.  Antibiotics discontinued for now. Patient started on steroids.   #3 sepsis  Likely secondary to problem #2. Urinalysis is negative. Chest x-ray consistent with recurrent worsening pneumonia. CT of the chest with multilobar and pneumonia and progression of worsening recurrent right upper lobe pneumonia .Blood cultures are negative.  -. All antibiotics have been discontinued at this time per ID. patient  be started on  oral prednisone per pulmonary. i will discharge him on prednisone 40 mg po daily for next 5 days.  .  #4 acute on chronic kidney disease stage III  Patient's baseline creatinine is approximately 1.4-1.7. Creatinine on admission was 2.8. Likely secondary to a prerenal azotemia initially. 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 Patient is currently euvolemic. Patient was given a dose of Lasix orally prior to starting steroids per pulmonary.. Renal ultrasound normal. Continue supportive care Renal fn slowly improved now with creatinine of 2.05 in am labs  #5 thrombocytopenia/Anemia  Likely secondary to sepsis. Patient is status post 3 unit of packed red blood cells. Afinitor has been discontinued. Hgb is now 8.1.  LDH WNL. Haptoglobin elevated at 409. Peripheral smear negative for schistocytes, however with elevated d dimer and fibrinogen was concerning for DIC. Patient was transfused one bag of platelets. Platelet count went up to 80 and is back down to 40 now. Patient does not have active bleeding. Repeat DIC panel with elevated d-dimer and fibrinogen levels. No schistocytes.  - Discussed with Dr Arline Asp of heme/onc . Patient has also been started on steroids per pulmonary. Monitor for now. Hematology following and appreciate input and recommendations.  #6 COPD  Stable. Nebs when necessary.   #7 metabolic acidosis  Likely secondary to sepsis versus acute on chronic kidney disease stage III. Improved with HCO3 in IVF  #8 hypomagnesemia/hypokalemia  Repleted potassium and mg   #9 hypernatremia  Improved with change of IV fluids.   #10 constipation  Resolved with MiraLAX. Colace 100 mg twice a day.   #11 Hodgkin's lymphoma, relapse  Afinitor has been discontinued secondary to his anemia, thrombocytopenia, sepsis.    prognosis  Patient has been doing poorly during this hospitalization and has been on a gradual decline  during this past year. Patient has had multiple admissions over the past 3 months for this pulmonary issue with unknown etiology. Patient has had acute on chronic kidney disease which was felt per renal to be likely secondary to his Hodgkin's lymphoma and his renal function had worsened  Patient has had a BAL and currently on steroids. Patient has had a anemia and they thrombocytopenia with platelets dropping as low as 16 and has received 2 bags of platelets during this hospitalization. Patient has also been transfused packed red blood cells during this hospitalization.  Palliative care team met with patient.. patient and family wish to go to [place] and be followed by hospice. He wishes to be DNR with comfort measures once discharged home. .   Code Status: DNR Family Communication: Updated patient and daughter and mother at bedside.  Disposition Plan: Home with hospice services    Consultants:  Pulmonary: Dr Wert/Dr. Craige Cotta 03/24/12  Hematolgy/Oncology: Dr Darrold Span 03/24/12  Renal : Dr. Arlean Hopping 03/28/12  ID ( [place])   Procedures:  CXR 03/23/12  Status post 1 unit of packed red blood cells on 03/23/2012  V/Q scan 03/24/12  CT Chest 03/24/12  Renal US 03/24/12  2 units PRBC 03/25/12  Platelets 1 bag 03/29/12  Bronchial alveolar lavage 03/29/2012  Platelets 1 bag 8 /17/ 2013   Discharge Exam: Filed Vitals:   04/05/12 0519  BP: 143/86  Pulse: 109  Temp: 99.9 F (37.7 C)  Resp: 18   Filed Vitals:   04/04/12 1615 04/04/12 1720 04/04/12 2212 04/05/12 0519  BP: 141/82   143/86  Pulse: 82   109  Temp: 98.2  F (36.8 C)   99.9 F (37.7 C)  TempSrc: Oral   Oral  Resp:    18  Height:      Weight:      SpO2: 100% 100% 95% 98%    General: Cachectic.  HEENT: No bruits, no goiter.  Heart: Tachycardia, without murmurs, rubs, gallops.  Lungs: Coarse BS b/l.  Abdomen: Soft, nontender, nondistended, positive bowel sounds.  Extremities: No clubbing cyanosis. 1-2 + BLE edema with positive  pedal pulses.  Neuro: Grossly intact, nonfocal.  Discharge Instructions  Discharge Orders    Future Appointments: Provider: Department: Dept Phone: Center:   04/06/2012 8:45 AM Krista Blue [facility] [phone] None   04/12/2012 1:15 PM Dava Najjar Idelle Jo [facility] [phone] None     Medication List  As of 04/05/2012  2:27 PM   STOP taking these medications         amoxicillin-clavulanate 875-125 MG per tablet      everolimus 10 MG tablet      HYDROcodone-homatropine 5-1.5 MG/5ML syrup      potassium chloride SA 20 MEQ tablet         TAKE these medications         albuterol 108 (90 BASE) MCG/ACT inhaler   Commonly known as: PROVENTIL HFA;VENTOLIN HFA   Inhale 2 puffs into the lungs every 4 (four) hours as needed. For shortness of breath.      benzonatate 200 MG capsule   Commonly known as: TESSALON   Take 1 capsule (200 mg total) by mouth 3 (three) times daily.      DSS 100 MG Caps   Take 100 mg by mouth 2 (two) times daily.      feeding supplement Liqd   Take 237 mLs by mouth 2 (two) times daily between meals.      gi cocktail Susp suspension   Take 30 mLs by mouth 3 (three) times daily as needed for indigestion. Shake well.      guaiFENesin-dextromethorphan 100-10 MG/5ML syrup   Commonly known as: ROBITUSSIN DM   Take 15 mLs by mouth every 6 (six) hours.      HYDROcodone-acetaminophen 5-500 MG per tablet   Commonly known as: VICODIN   Take 1 tablet by mouth every 6 (six) hours as needed. For pain.      LORazepam 1 MG tablet   Commonly known as: ATIVAN   Take 1 tablet (1 mg total) by mouth every 6 (six) hours as needed for anxiety.      megestrol 400 MG/10ML suspension   Commonly known as: MEGACE   Take 10 mLs (400 mg total) by mouth 2 (two) times daily.      multivitamin with minerals Tabs   Take 1 tablet by mouth daily.      ondansetron 8 MG tablet   Commonly known as: ZOFRAN   Take 8 mg by mouth every 8 (eight) hours as needed.  For nausea      pantoprazole 40 MG tablet   Commonly known as: PROTONIX   Take 1 tablet (40 mg total) by mouth daily.      predniSONE 20 MG tablet   Commonly known as: DELTASONE   Take 2 tablets (40 mg total) by mouth daily with breakfast.      ranitidine 150 MG tablet   Commonly known as: ZANTAC   Take 150 mg by mouth 2 (two) times daily as needed. Heartburn      temazepam 30 MG capsule   Commonly  known as: RESTORIL   Take 1 capsule (30 mg total) by mouth at bedtime as needed.           Follow-up Information    Follow up with [facility] JR,GEORGE R, MD in 1 week.   Contact information:   Individual [address] [place] [address] [phone]           The results of significant diagnostics from this hospitalization (including imaging, microbiology, ancillary and laboratory) are listed below for reference.    Significant Diagnostic Studies: Dg Chest 2 View  03/30/2012  *RADIOLOGY REPORT*  Clinical Data: Follow up pulmonary infiltrates.  Hypertension. Short of breath.  CHEST - 2 VIEW  Comparison: 03/29/2012.  Findings: Worsening aeration at the lung bases.  Progressive airspace disease, particularly at the cardiac apex.  Differential considerations include worsening pneumonia, edema, or aspiration. Combination of these densities could be present as well. Consolidation of the right upper lobe respecting the fissure. Grossly, the cardiopericardial silhouette appears unchanged.  IMPRESSION:  1.  Unchanged appearance of the right upper lobe. 2.  Marked worsening of basilar airspace disease as described above.  Original Report Authenticated By: Andreas Newport, M.D.   Dg Chest 2 View  03/28/2012  *RADIOLOGY REPORT*  Clinical Data: Cough, follow up right upper lobe infiltrate  CHEST - 2 VIEW  Comparison: 03/26/2012  Findings: Cardiomediastinal silhouette is stable.  Stable right Port-A-Cath position.  Persistent right upper lobe consolidation. Worsening  airspace disease in the right lower lobe.  Stable streaky left lower lobe atelectasis or infiltrate.  IMPRESSION:  Stable right Port-A-Cath position.  Persistent right upper lobe consolidation.  Worsening airspace disease in the right lower lobe. Stable streaky left lower lobe atelectasis or infiltrate.  Original Report Authenticated By: Natasha Mead, M.D.   Dg Chest 2 View  03/23/2012  *RADIOLOGY REPORT*  Clinical Data: Cough.  Pleurisy.  Short of breath.  History of cancer.  CHEST - 2 VIEW  Comparison: 03/05/2012.  Findings: Progressive airspace disease is present in the right upper lobe, best seen on the lateral view.  When comparing lateral views to prior chest radiograph 03/05/2012, there is continued/progressive consolidation of the right upper lobe.  Support apparatus appears unchanged.  There is some faint airspace disease in the left midlung as well.  Cardiopericardial silhouette unchanged.  Right IJ power port appears similar.  IMPRESSION: Progressive consolidation of the right upper lobe, best seen on the lateral view.  Faint airspace opacity in the left midlung, which may also represent pneumonia.  Original Report Authenticated By: Andreas Newport, M.D.   Ct Chest Wo Contrast  03/24/2012  *RADIOLOGY REPORT*  Clinical Data: Recurrent pneumonia.  History of Hodgkin's lymphoma.  CT CHEST WITHOUT CONTRAST  Technique:  Multidetector CT imaging of the chest was performed following the standard protocol without IV contrast.  Comparison: Chest radiograph 03/23/2012, CT 02/23/2012, PET CT scan 12/21/2011  Findings: Compared to the CT of 02/23/2012, there is again demonstrated a dense pneumonia within the right upper lobe superimposed on severe central lobular emphysema.  This process involves more of the right upper lobe tahan prior and is now confluent throughout the right upper lobe and apex.  There is a new curvilinear gas collection posterior at the right lung apex which either represents a confluence of the  severe central emphysema or a pneumothorax.  No significant volume loss associated this process. The air bronchograms seen in the right middle lobe on prior exam are filled.(Image 24).  There is new nodular  branching air space disease within the lingula (image 40).  Likewise in the posterior right lower lobe there is increase in interlobular septal thickening and mild air space disease.  There is small right effusion at the right lung base which is increased.  Central airways are normal.  There is a port in the right chest wall.  No axillary supraclavicular adenopathy.  Small mediastinal lymph nodes similar to prior.  Esophagus is thick-walled.  Heart appears normal this noncontrast exam.  Review of the upper abdomen is somewhat limited due to high density barium within the splenic flexure the colon.  The spleen appears normal.  Limited view of the skeleton is unremarkable.  IMPRESSION:  1.  Continued progression of severe consolidated pneumonia in the right upper lobe superimposed on severe central lobular emphysema. 2.  Some retraction of the pleural edge at the posterior right lung apex which either represents  consolidation of central lobular emphysema or small pneumothorax. 3.  There is new air space disease in the lingula with a peripheral nodular component which suggest progression of the same process in the right upper lobe. 4.  Mild air space disease in the right lower lobe.  Original Report Authenticated By: Genevive Bi, M.D.   Nm Pulmonary Perfusion  03/24/2012  *RADIOLOGY REPORT*  Clinical Data:  Short of breath.  History of Hodgkin's lymphoma. Severe right upper lobe pneumonia which is progressing by CT.  NUCLEAR MEDICINE VENTILATION - PERFUSION LUNG SCAN  Technique:    Perfusion images were obtained in multiple projections after intravenous injection of Tc-71m MAA.  Radiopharmaceuticals: 3.10 mCi Tc-56m MAA.  Comparison: Perfusion scan 07/10 1013  Findings:  Perfusion: There is marked decrease   perfusion to the right upper lobe which corresponds to the the consolidated pneumonia in the right upper lobe.  There are no peripheral perfusion defects at the lung base to  suggest acute pulmonary acute pulmonary embolism. There is some heterogeneity of perfusion to the left upper lobe similar to prior and related to emphysema. Pattern matches the pulmonary perfusion scan pattern of 1 month prior.   IMPRESSION:  Very low probability for acute pulmonary embolism.  Original Report Authenticated By: Genevive Bi, M.D.   Dg Esophagus  03/08/2012  *RADIOLOGY REPORT*  Clinical Data: Dysphagia.  ESOPHOGRAM / BARIUM SWALLOW  Technique:  Combined double contrast and single contrast examination performed using effervescent crystals, thick barium liquid, and thin barium liquid.  Fluoroscopy time:  1.2 minutes.  Comparison: 02/23/2012  Findings:  The cervical and thoracic esophagus are patent.  No high- grade stricture or mass.  Normal motility of the esophagus.  The patient ingested a 13 mm barium tablet which easily passed through the esophagus and into the stomach.  No reflux identified.  IMPRESSION:  1.  Normal esophagram.  Original Report Authenticated By: Rosealee Albee, M.D.   US Renal  03/25/2012  *RADIOLOGY REPORT*  Clinical Data: Renal failure  RENAL/URINARY TRACT ULTRASOUND COMPLETE  Comparison: PET CT scan 12/21/2011  Findings:  Right Kidney = 11.4 cm.  No evidence of hydronephrosis, cyst, mass, or stone.  Left kidney = 10.8 cm.  No evidence of hydronephrosis, cyst, mass, or stone.  Bladder:  Bladder partially distended.  No irregularity identified.  IMPRESSION:  Normal renal ultrasound.  Original Report Authenticated By: Genevive Bi, M.D.   Dg Chest Port 1 View  04/04/2012  *RADIOLOGY REPORT*  Clinical Data: Follow up right upper lobe infiltrate  PORTABLE CHEST - 1 VIEW  Comparison: 03/30/2012  Findings: There is  a right chest wall porta-catheter with tip in the SVC.  Heart size is normal.  No  pleural effusion or edema. Advanced changes of emphysema noted.  Superimposed bilateral airspace disease is again noted.  When compared with the previous exam there has been significant improvement in aeration to both lung bases.  There has also been mild improvement in right upper lobe consolidation.  IMPRESSION:  1.  Overall improved appearance of the lungs with interval decrease in bibasilar infiltrates.   Original Report Authenticated By: Rosealee Albee, M.D.    Dg Chest Port 1 View  03/29/2012  *RADIOLOGY REPORT*  Clinical Data: Status post bronchoscopy, respiratory distress  PORTABLE CHEST - 1 VIEW  Comparison: 03/28/2012  Findings: Cardiomediastinal silhouette is stable.  Stable right Port-A-Cath position.  Stable consolidation in the right upper lobe.  Mild improved aeration in the right lower lobe.  Persistent patchy airspace disease right lower lobe and left midlung.  No diagnostic pneumothorax.  No convincing pulmonary edema.  IMPRESSION: Stable right Port-A-Cath position.  Stable consolidation in the right upper lobe.  Mild improved aeration in the right lower lobe. Persistent patchy airspace disease right lower lobe and left midlung.  No diagnostic pneumothorax.  No convincing pulmonary edema.  Original Report Authenticated By: Natasha Mead, M.D.   Dg Chest Port 1 View  03/26/2012  *RADIOLOGY REPORT*  Clinical Data: Pneumonia.  PORTABLE CHEST - 1 VIEW  Comparison: 03/24/2012.  03/23/2012.  Findings: Compared to the prior chest radiograph, there is worsening aeration at the bases, there is worsening aeration at the bases, likely representing pulmonary edema superimposed on the right upper lobe consolidation.  Right IJ power port is unchanged. Mediastinal contours appear within normal limits.  There is no plain film evidence of pneumothorax.  IMPRESSION: 1.  Unchanged support apparatus. 2.  Persistent right upper lobe consolidation. 3.  Development of airspace disease at both lung bases.  This is  favored to represent edema however aspiration or multifocal pneumonia are in the differential considerations.  Original Report Authenticated By: Andreas Newport, M.D.    Microbiology: Recent Results (from the past 240 hour(s))  AFB CULTURE WITH SMEAR     Status: Normal (Preliminary result)   Collection Time   03/29/12 10:42 AM      Component Value Range Status Comment   Specimen Description BRONCHIAL ALVEOLAR LAVAGE LEFT   Final    Special Requests Immunocompromised   Final    ACID FAST SMEAR NO ACID FAST BACILLI SEEN   Final    Culture     Final    Value: CULTURE WILL BE EXAMINED FOR 6 WEEKS BEFORE ISSUING A FINAL REPORT   Report Status PENDING   Incomplete   FUNGUS CULTURE W SMEAR     Status: Normal (Preliminary result)   Collection Time   03/29/12 10:42 AM      Component Value Range Status Comment   Specimen Description BRONCHIAL ALVEOLAR LAVAGE LEFT   Final    Special Requests Immunocompromised   Final    Fungal Smear NO YEAST OR FUNGAL ELEMENTS SEEN   Final    Culture CANDIDA TROPICALIS   Final    Report Status PENDING   Incomplete   CULTURE, BAL-QUANTITATIVE     Status: Normal   Collection Time   03/29/12 10:42 AM      Component Value Range Status Comment   Specimen Description BRONCHIAL ALVEOLAR LAVAGE LEFT   Final    Special Requests NONE   Final    Gram  Stain     Final    Value: FEW WBC PRESENT,BOTH PMN AND MONONUCLEAR     NO ORGANISMS SEEN   Colony Count 3,000 COLONIES/ML   Final    Culture YEAST CONSISTENT WITH CANDIDA SPECIES   Final    Report Status 04/01/2012 FINAL   Final   FUNGUS CULTURE W SMEAR     Status: Normal (Preliminary result)   Collection Time   03/29/12 11:54 AM      Component Value Range Status Comment   Specimen Description BRONCHIAL ALVEOLAR LAVAGE RIGHT   Final    Special Requests Immunocompromised   Final    Fungal Smear RARE YEAST WITH PSEUDOHYPHAE   Final    Culture CANDIDA FAMATA   Final    Report Status PENDING   Incomplete   CULTURE,  BAL-QUANTITATIVE     Status: Normal   Collection Time   03/29/12 11:54 AM      Component Value Range Status Comment   Specimen Description BRONCHIAL ALVEOLAR LAVAGE RIGHT   Final    Special Requests NONE   Final    Gram Stain     Final    Value: FEW WBC PRESENT,BOTH PMN AND MONONUCLEAR     NO SQUAMOUS EPITHELIAL CELLS SEEN     NO ORGANISMS SEEN   Colony Count 20,OOO COLONIES/ML   Final    Culture YEAST CONSISTENT WITH CANDIDA SPECIES   Final    Report Status 03/31/2012 FINAL   Final   MRSA PCR SCREENING     Status: Normal   Collection Time   03/29/12  3:33 PM      Component Value Range Status Comment   MRSA by PCR NEGATIVE  NEGATIVE Final      Labs: Basic Metabolic Panel:  Lab 04/05/12 1610 04/04/12 0405 04/03/12 0540 04/02/12 0500 04/01/12 0408  NA 143 142 145 144 146*  K 4.1 4.5 4.3 3.2* 4.0  CL 108 109 111 110 113*  CO2 24 23 22 21  18*  GLUCOSE 112* 192* 138* 164* 131*  BUN 45* 51* 55* 63* 56*  CREATININE 2.06* 2.41* 2.82* 3.22* 3.36*  CALCIUM 8.6 8.7 8.6 8.1* 8.2*  MG -- -- -- -- --  PHOS 3.1 3.3 3.1 4.1 4.8*   Liver Function Tests:  Lab 04/05/12 0500 04/04/12 0405 04/03/12 0540 04/02/12 0500 04/01/12 0408 03/31/12 0523  AST -- -- -- -- -- 8  ALT -- -- -- -- -- 5  ALKPHOS -- -- -- -- -- 330*  BILITOT -- -- -- -- -- 0.2*  PROT -- -- -- -- -- 4.9*  ALBUMIN 1.7* 1.7* 1.7* 1.6* 1.5* --   No results found for this basename: LIPASE:5,AMYLASE:5 in the last 168 hours No results found for this basename: AMMONIA:5 in the last 168 hours CBC:  Lab 04/05/12 0500 04/04/12 0405 04/03/12 0540 04/02/12 0535 04/01/12 0404  WBC 16.4* 13.6* 14.3* 12.5* 15.8*  NEUTROABS 14.8* 12.1* 12.5* 11.3* 14.4*  HGB 8.1* 8.9* 8.1* 9.0* 9.3*  HCT 23.7* 26.0* 23.6* 25.7* 26.5*  MCV 80.9 81.0 80.8 80.6 81.0  PLT 46* 40* 31* 31* 49*   Cardiac Enzymes: No results found for this basename: CKTOTAL:5,CKMB:5,CKMBINDEX:5,TROPONINI:5 in the last 168 hours BNP: BNP (last 3 results)  Basename  03/24/12 1149 03/05/12 2100 02/22/12 1600  PROBNP 1538.0* 479.3* 1118.0*   CBG: No results found for this basename: GLUCAP:5 in the last 168 hours  Time coordinating discharge:45 minutes  Signed:  Godwin Tedesco  [facility] 04/05/2012, 2:27 PM

## 2012-04-05 NOTE — Plan of Care (Signed)
Problem: Discharge Progression Outcomes Goal: Discharge plan in place and appropriate Outcome: Completed/Met Date Met:  04/05/12 DC home today in private transportation.  [facility] to provide services.  To meet w/ family this afternoon when pt returns home.

## 2012-04-05 NOTE — Progress Notes (Signed)
Report called to [facility] RN Noel Christmas.  Pt to transport home in family car.  Pt given Percocet 2T for comfort enroute home, though denies pain at this time.

## 2012-04-05 NOTE — Progress Notes (Signed)
Wife and two daughters in room with Mr. Keith Bell. All are looking forward to going home soon. Patient and family desire for patient to be surrounded by family when he dies.  Strong Christian faith. Listening; presence.  04/05/12 1400  Clinical Encounter Type  Visited With Patient and family together  Visit Type Spiritual support;Social support  Referral From Palliative care team  Recommendations Follow up as necessary  Stress Factors  Patient Stress Factors Health changes  Family Stress Factors Loss

## 2012-04-06 ENCOUNTER — Other Ambulatory Visit: Payer: Medicare Other | Admitting: Lab

## 2012-04-06 LAB — TYPE AND SCREEN: Unit division: 0

## 2012-04-12 ENCOUNTER — Other Ambulatory Visit: Payer: Medicare Other | Admitting: Lab

## 2012-04-24 LAB — FUNGUS CULTURE W SMEAR

## 2012-05-14 LAB — AFB CULTURE WITH SMEAR (NOT AT ARMC): Acid Fast Smear: NONE SEEN

## 2012-05-15 DEATH — deceased

## 2013-08-13 IMAGING — CR DG CHEST 2V
2 series · 2 of 2 positions shown · non-contrast
Comparison: 03/26/2012

CLINICAL DATA: Cough, follow up right upper lobe infiltrate

CHEST - 2 VIEW

[w chest lat]
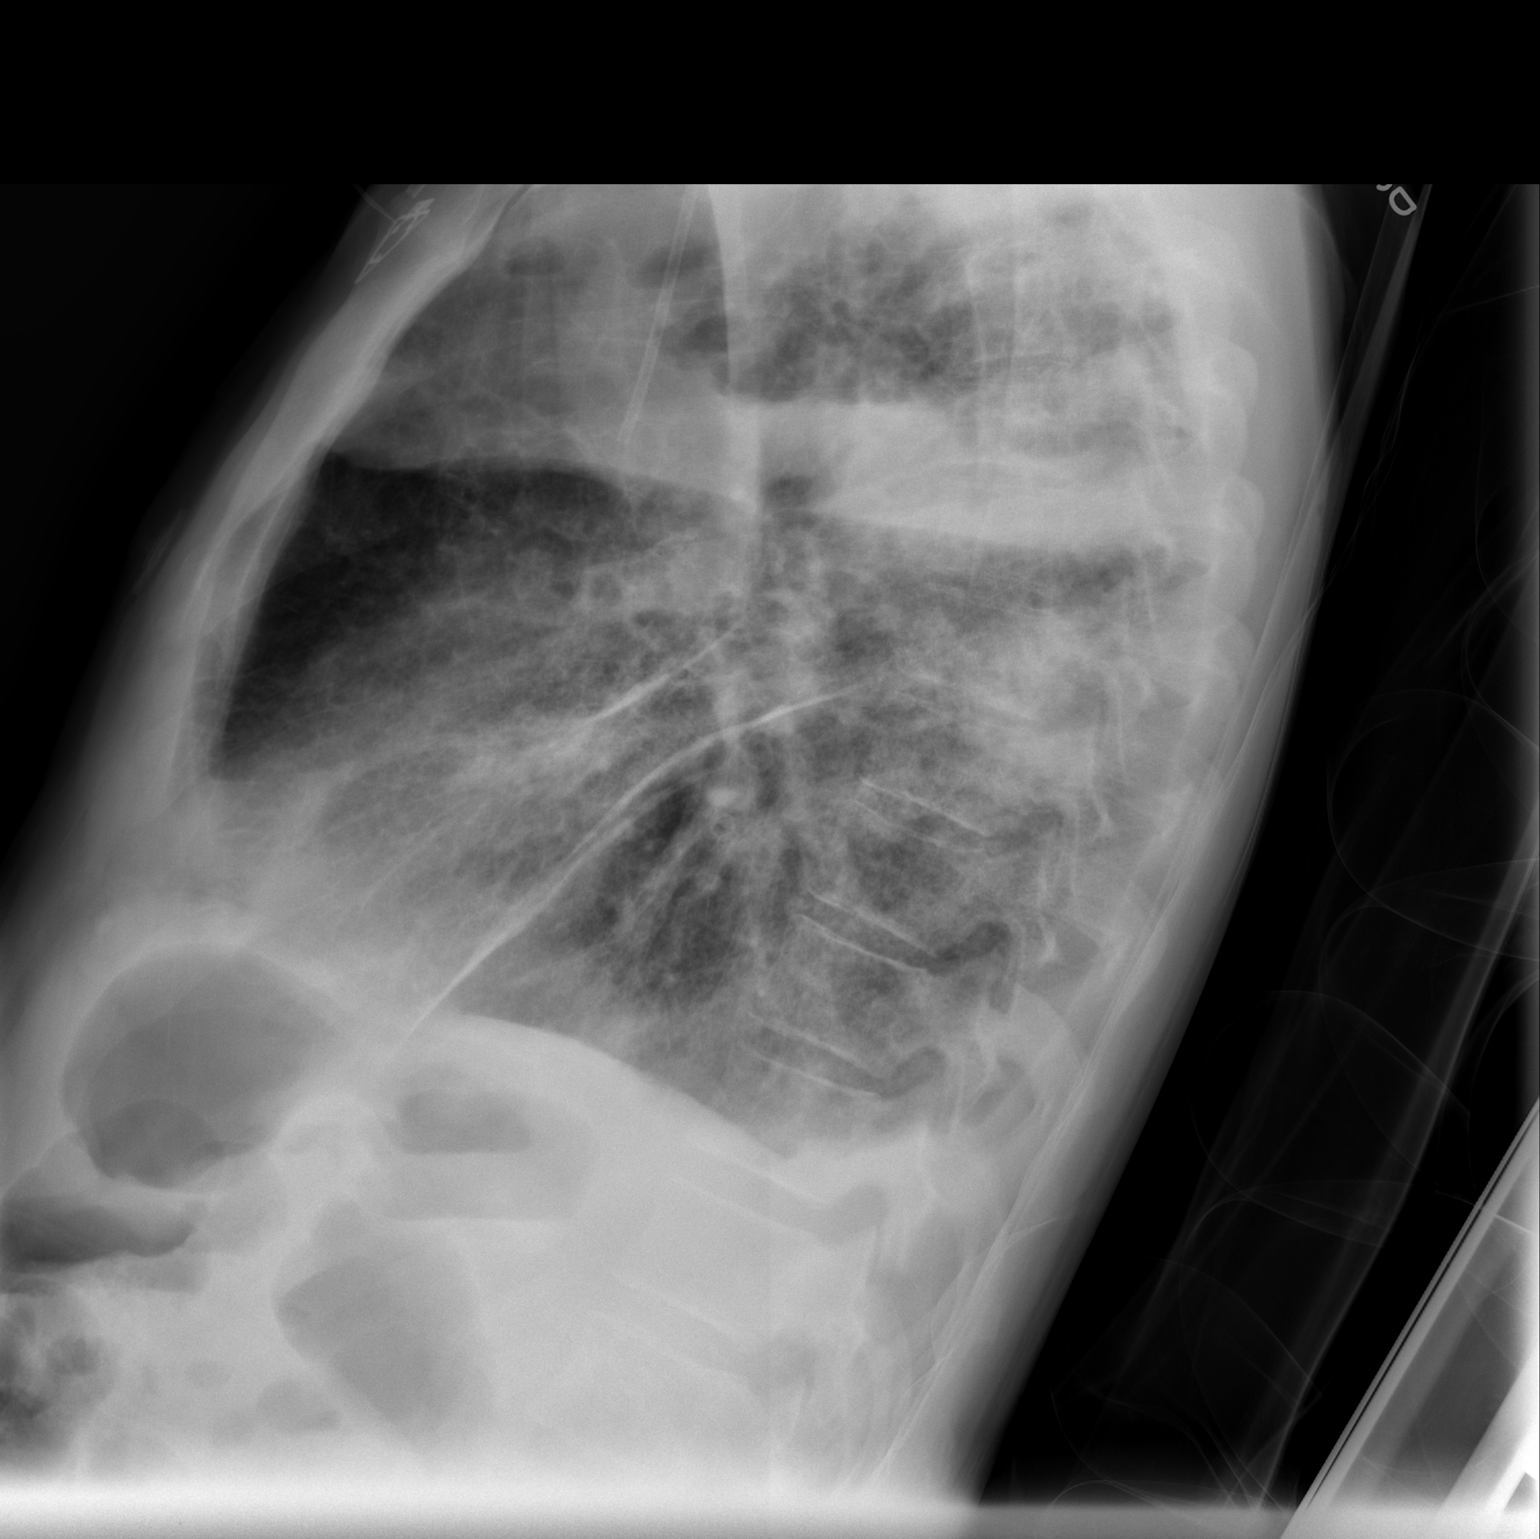

[view not recorded]
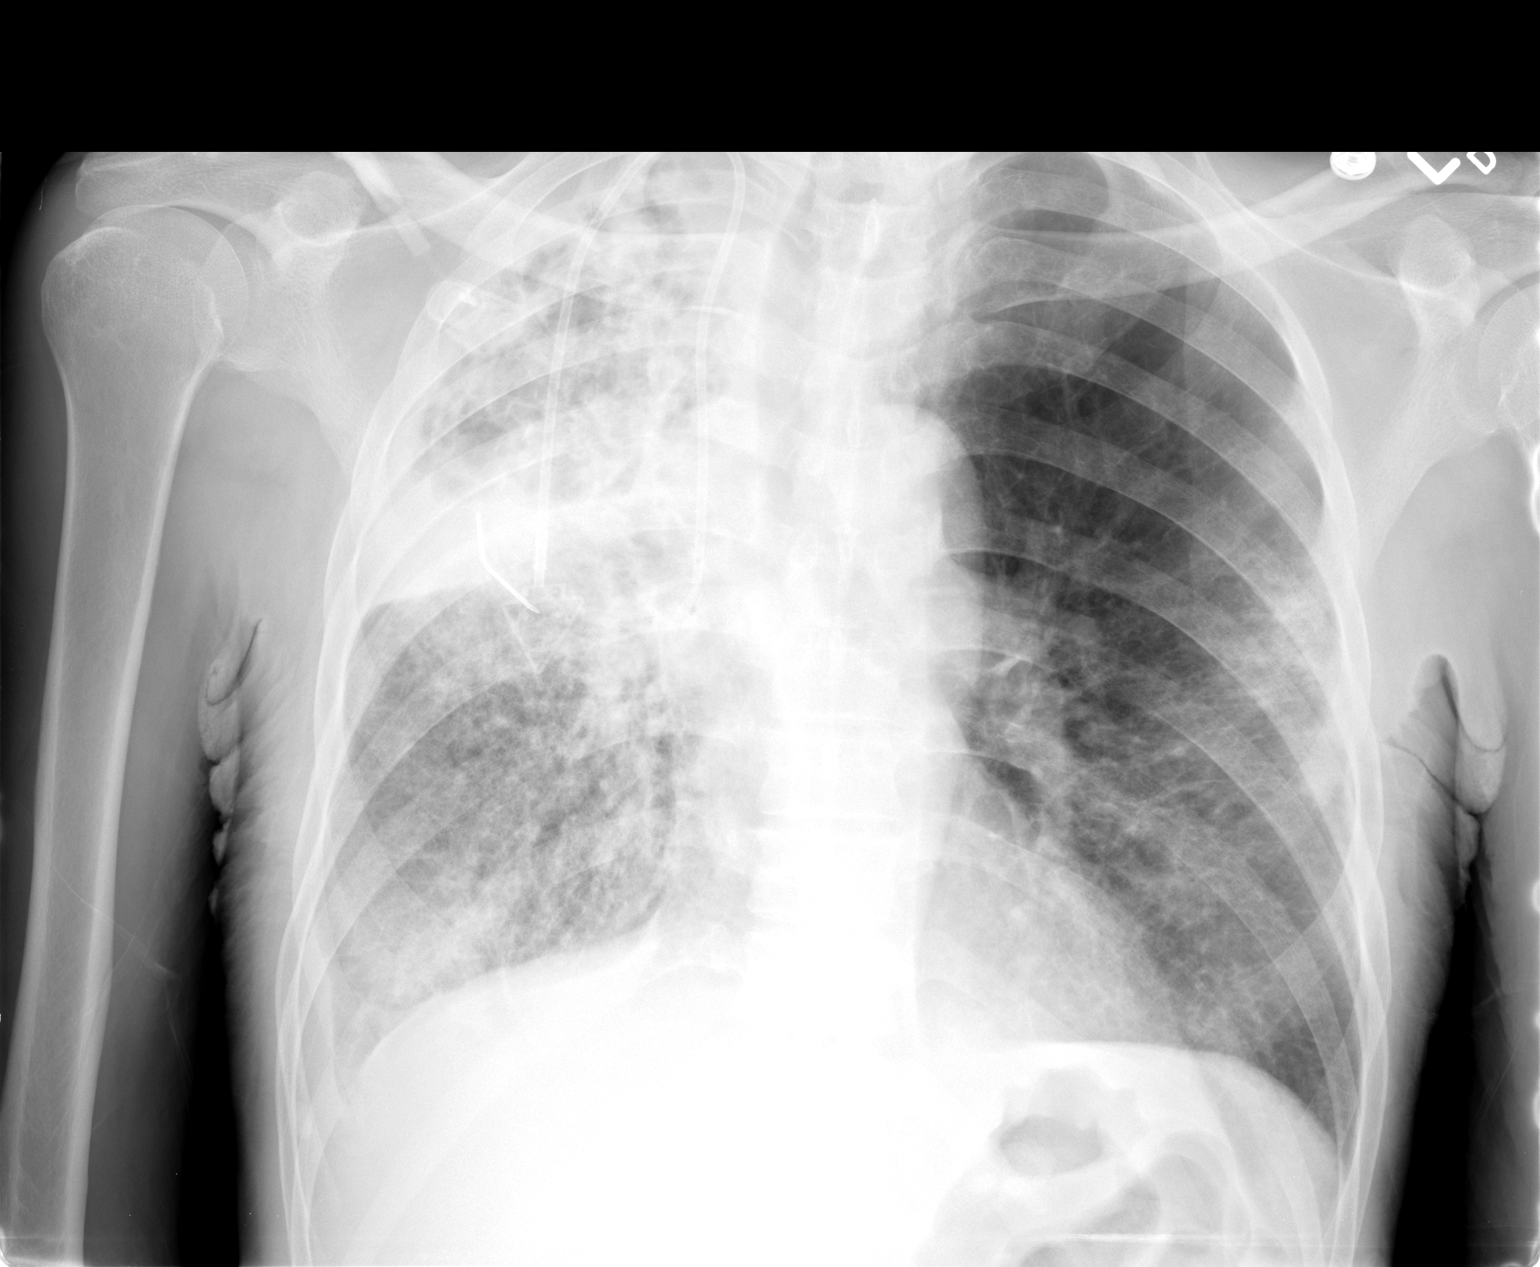

[2 of 2 positions shown; findings below may reference images not displayed]

FINDINGS: Cardiomediastinal silhouette is stable.  Stable right
Port-A-Cath position.  Persistent right upper lobe consolidation.
Worsening airspace disease in the right lower lobe.  Stable streaky
left lower lobe atelectasis or infiltrate.
IMPRESSION: Stable right Port-A-Cath position.  Persistent right upper lobe
consolidation.  Worsening airspace disease in the right lower lobe.
Stable streaky left lower lobe atelectasis or infiltrate.

## 2013-08-14 IMAGING — CR DG CHEST 1V PORT
1 series · 1 of 1 positions shown · non-contrast
Comparison: 03/28/2012

CLINICAL DATA: Status post bronchoscopy, respiratory distress

PORTABLE CHEST - 1 VIEW

[AP]
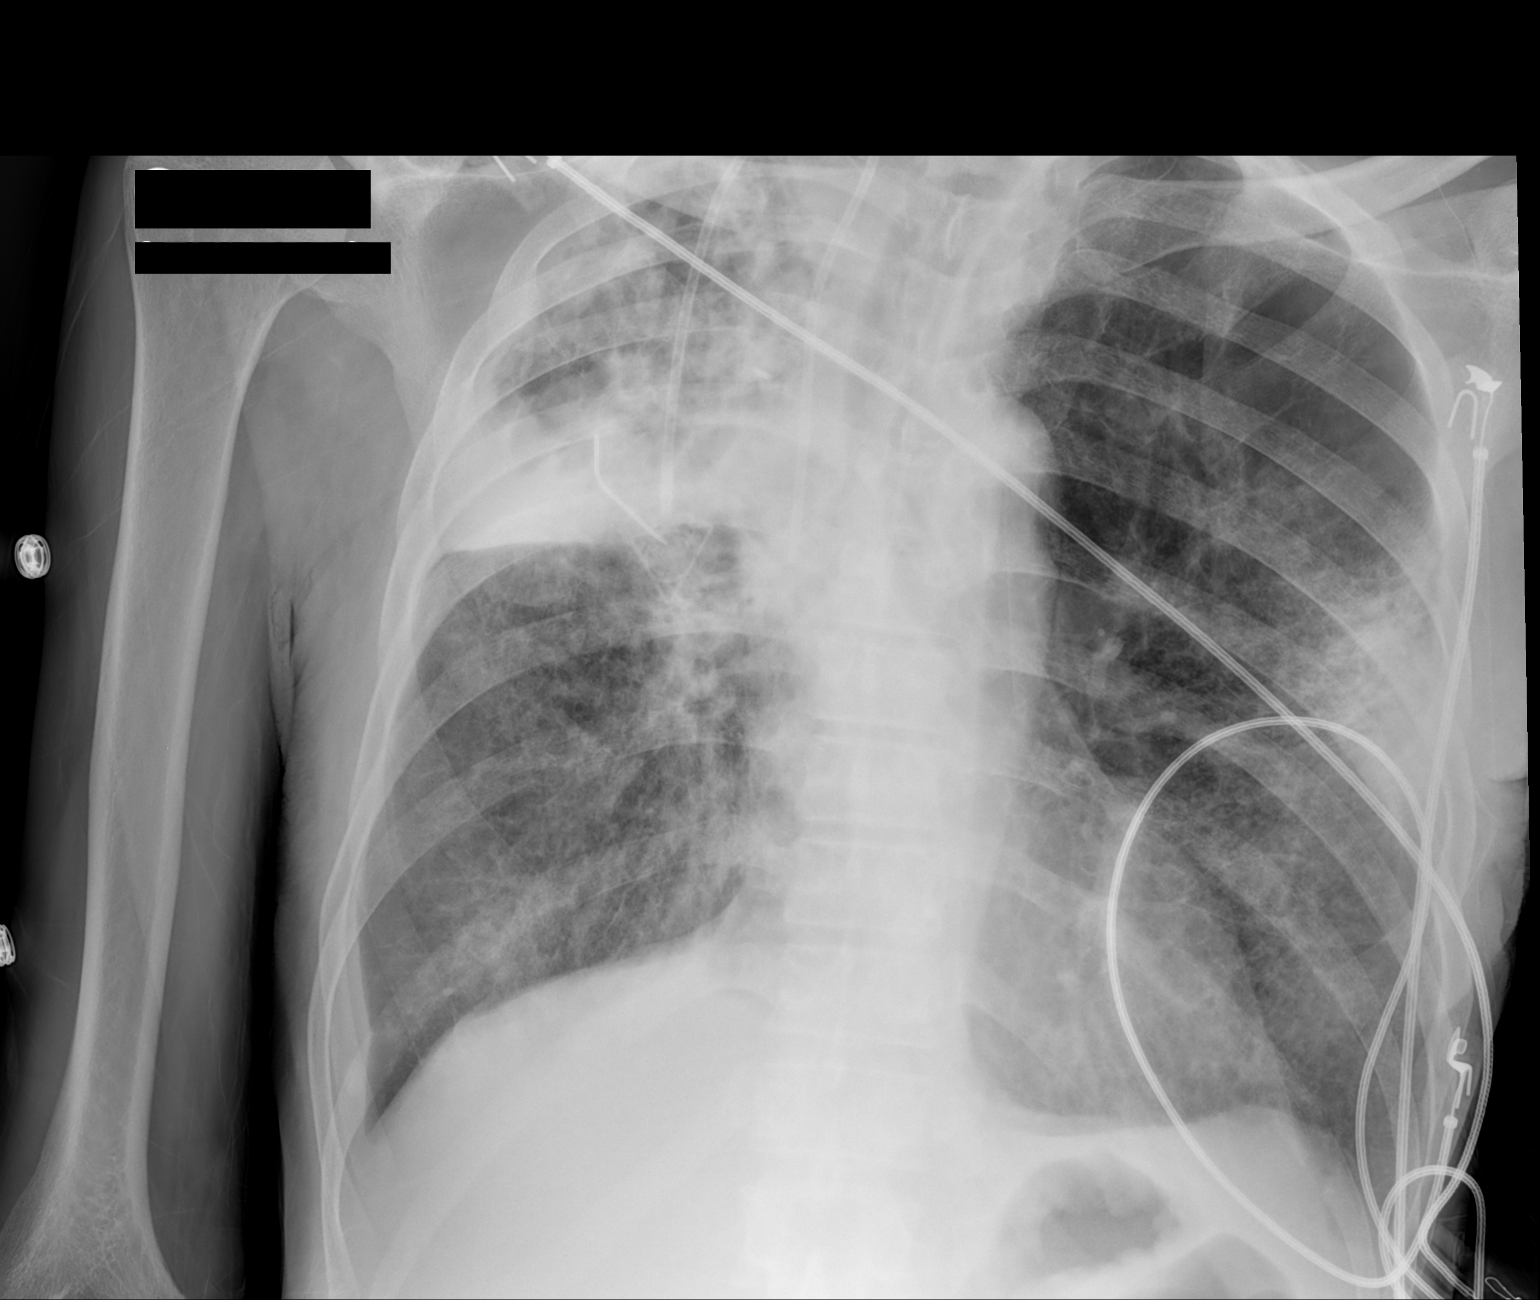

[1 of 1 positions shown; findings below may reference images not displayed]

FINDINGS: Cardiomediastinal silhouette is stable.  Stable right
Port-A-Cath position.  Stable consolidation in the right upper
lobe.  Mild improved aeration in the right lower lobe.  Persistent
patchy airspace disease right lower lobe and left midlung.  No
diagnostic pneumothorax.  No convincing pulmonary edema.
IMPRESSION: Stable right Port-A-Cath position.  Stable consolidation in the
right upper lobe.  Mild improved aeration in the right lower lobe.
Persistent patchy airspace disease right lower lobe and left
midlung.  No diagnostic pneumothorax.  No convincing pulmonary
edema.]

## 2013-08-15 IMAGING — CR DG CHEST 2V
2 series · 2 of 2 positions shown · non-contrast
Comparison: 03/29/2012.

CLINICAL DATA: Follow up pulmonary infiltrates.  Hypertension.
Short of breath.

CHEST - 2 VIEW

[w chest lat]
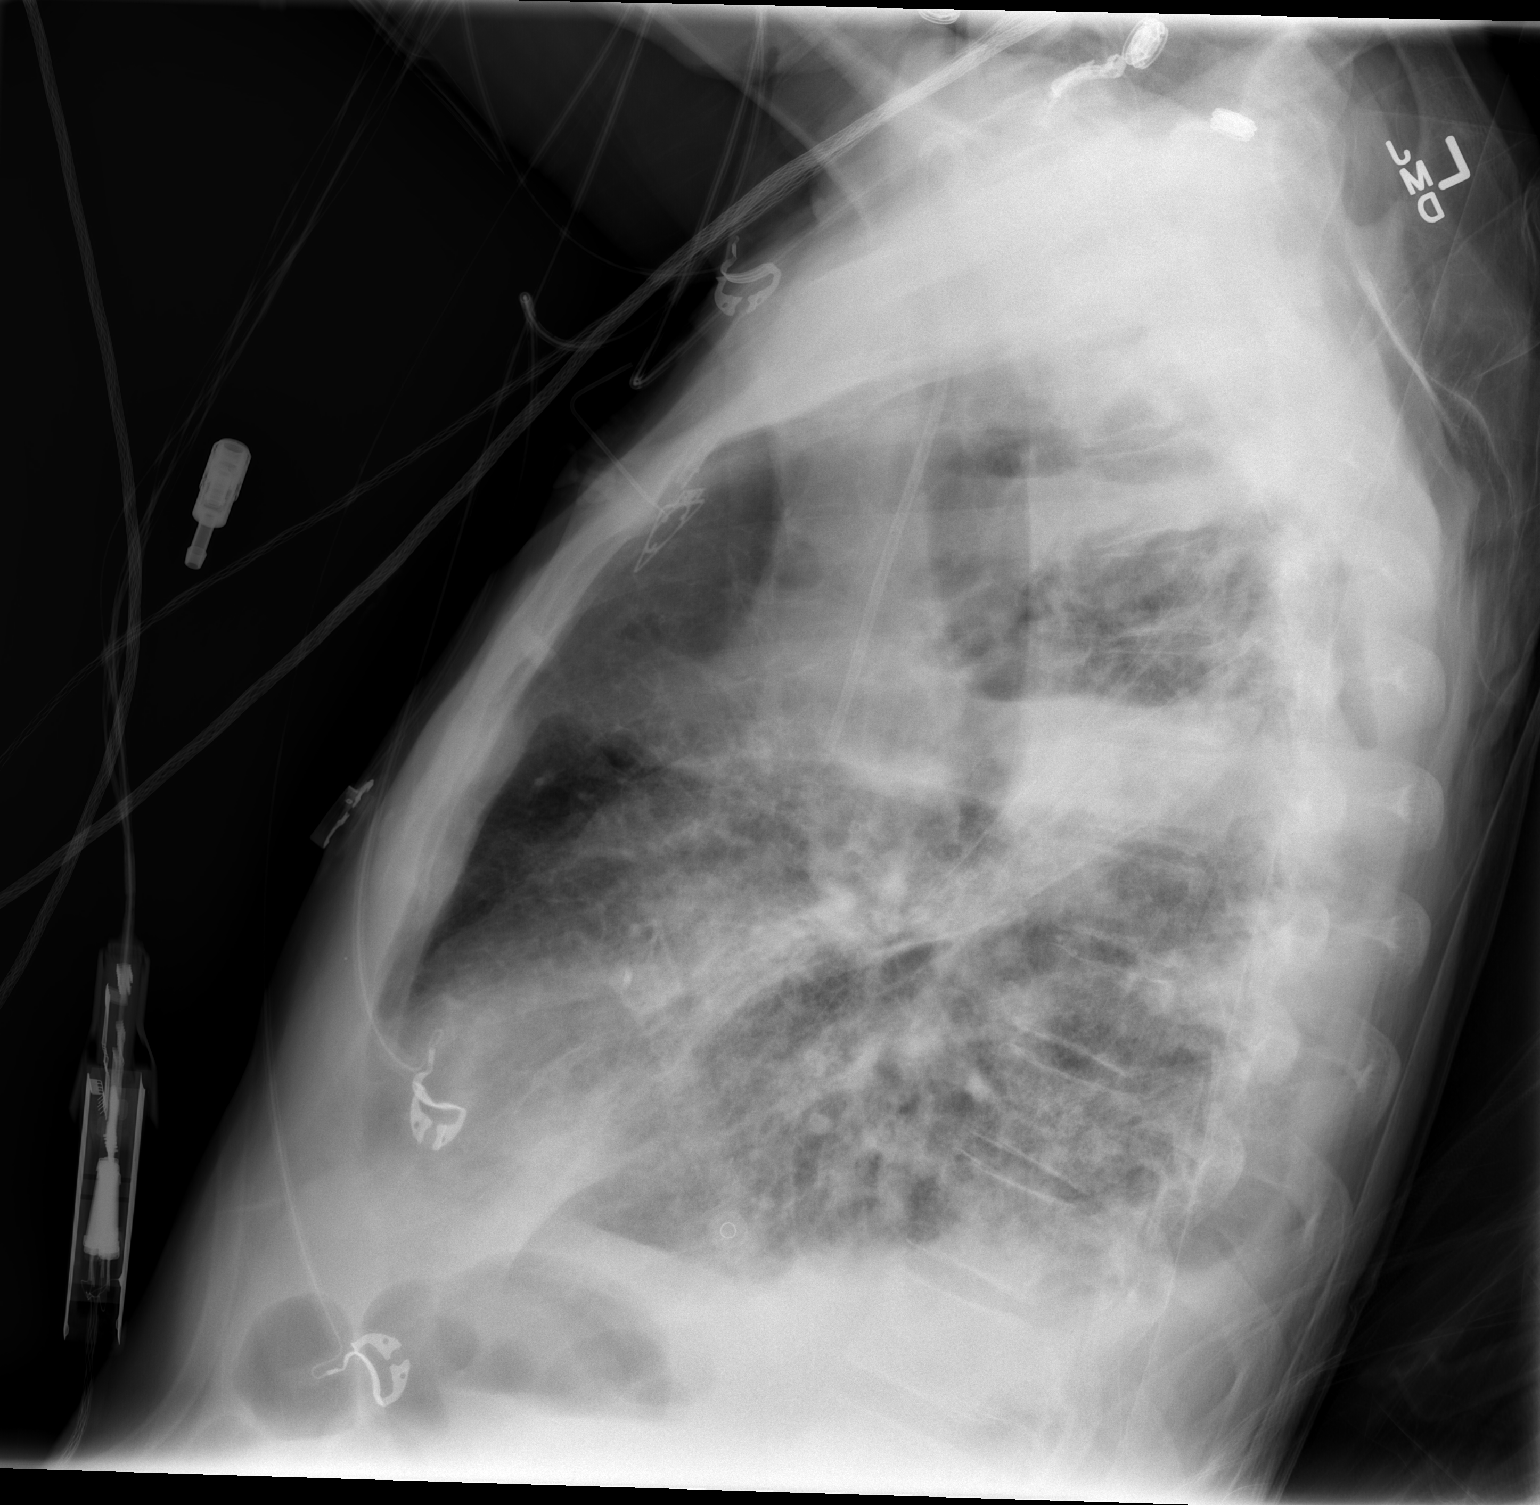

[view not recorded]
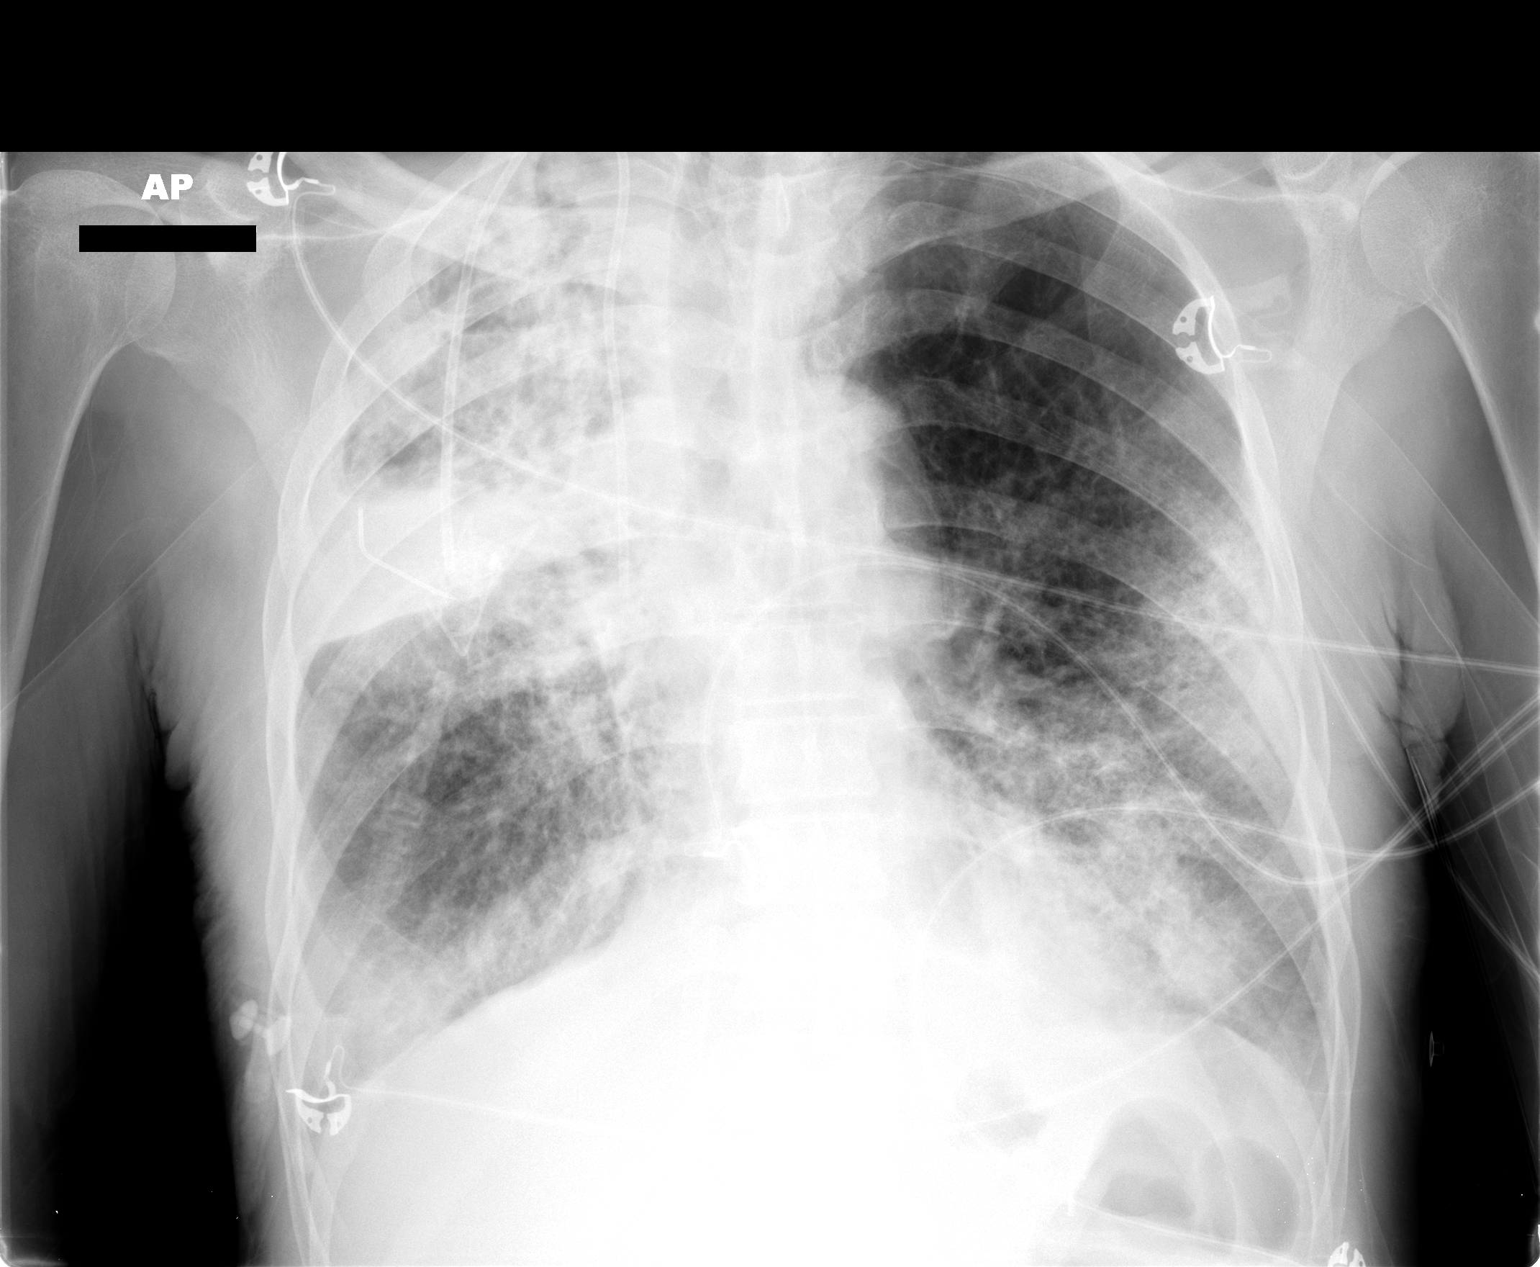

[2 of 2 positions shown; findings below may reference images not displayed]

FINDINGS: Worsening aeration at the lung bases.  Progressive
airspace disease, particularly at the cardiac apex.  Differential
considerations include worsening pneumonia, edema, or aspiration.
Combination of these densities could be present as well.
Consolidation of the right upper lobe respecting the fissure.
Grossly, the cardiopericardial silhouette appears unchanged.
IMPRESSION: 1.  Unchanged appearance of the right upper lobe.
2.  Marked worsening of basilar airspace disease as described
above.

## 2014-08-02 ENCOUNTER — Other Ambulatory Visit: Payer: Self-pay | Admitting: Nurse Practitioner

## 2015-04-30 ENCOUNTER — Telehealth: Payer: Self-pay

## 2015-06-02 NOTE — Telephone Encounter (Signed)
No msg °

## 2019-05-13 ENCOUNTER — Telehealth (INDEPENDENT_AMBULATORY_CARE_PROVIDER_SITE_OTHER): Payer: Self-pay

## 2019-05-13 NOTE — Telephone Encounter (Signed)
If he is getting short of breath just trying to talk, he needs to go back to the emergency room and be reevaluated.  He may need admission again.

## 2019-05-13 NOTE — Telephone Encounter (Signed)
Pt said he will call his heart dr today.to see about being re admitted due to chf symptoms.

## 2019-05-16 ENCOUNTER — Telehealth (INDEPENDENT_AMBULATORY_CARE_PROVIDER_SITE_OTHER): Payer: Self-pay

## 2019-05-16 NOTE — Telephone Encounter (Signed)
Open by error. Lookup.

## 2019-11-26 ENCOUNTER — Telehealth (INDEPENDENT_AMBULATORY_CARE_PROVIDER_SITE_OTHER): Payer: Self-pay

## 2019-11-26 NOTE — Telephone Encounter (Signed)
, °

## 2021-03-18 ENCOUNTER — Encounter (INDEPENDENT_AMBULATORY_CARE_PROVIDER_SITE_OTHER): Payer: Self-pay
